# Patient Record
Sex: Male | Born: 1965
Health system: Southern US, Community
[De-identification: ages and names within clinical notes are randomized; demographics above are authoritative.]

## PROBLEM LIST (undated history)

## (undated) DIAGNOSIS — N182 Chronic kidney disease, stage 2 (mild): Secondary | ICD-10-CM

## (undated) DIAGNOSIS — Z8719 Personal history of other diseases of the digestive system: Secondary | ICD-10-CM

## (undated) DIAGNOSIS — I4891 Unspecified atrial fibrillation: Principal | ICD-10-CM

## (undated) DIAGNOSIS — Z720 Tobacco use: Secondary | ICD-10-CM

## (undated) DIAGNOSIS — F1020 Alcohol dependence, uncomplicated: Secondary | ICD-10-CM

## (undated) DIAGNOSIS — F329 Major depressive disorder, single episode, unspecified: Secondary | ICD-10-CM

## (undated) DIAGNOSIS — R51 Headache: Secondary | ICD-10-CM

## (undated) DIAGNOSIS — I219 Acute myocardial infarction, unspecified: Secondary | ICD-10-CM

## (undated) DIAGNOSIS — R519 Headache, unspecified: Secondary | ICD-10-CM

## (undated) DIAGNOSIS — F419 Anxiety disorder, unspecified: Secondary | ICD-10-CM

## (undated) DIAGNOSIS — J449 Chronic obstructive pulmonary disease, unspecified: Secondary | ICD-10-CM

## (undated) DIAGNOSIS — F32A Depression, unspecified: Secondary | ICD-10-CM

## (undated) DIAGNOSIS — I639 Cerebral infarction, unspecified: Secondary | ICD-10-CM

## (undated) DIAGNOSIS — I10 Essential (primary) hypertension: Secondary | ICD-10-CM

## (undated) DIAGNOSIS — IMO0001 Reserved for inherently not codable concepts without codable children: Secondary | ICD-10-CM

## (undated) HISTORY — PX: FRACTURE SURGERY: SHX138

## (undated) HISTORY — PX: HERNIA REPAIR: SHX51

---

## 2002-09-06 ENCOUNTER — Emergency Department (HOSPITAL_COMMUNITY): Admission: EM | Admit: 2002-09-06 | Discharge: 2002-09-06 | Payer: Self-pay | Admitting: Emergency Medicine

## 2003-09-03 ENCOUNTER — Observation Stay (HOSPITAL_COMMUNITY): Admission: EM | Admit: 2003-09-03 | Discharge: 2003-09-04 | Payer: Self-pay | Admitting: Emergency Medicine

## 2007-05-08 ENCOUNTER — Emergency Department (HOSPITAL_COMMUNITY): Admission: EM | Admit: 2007-05-08 | Discharge: 2007-05-08 | Payer: Self-pay | Admitting: Emergency Medicine

## 2007-07-30 ENCOUNTER — Emergency Department (HOSPITAL_COMMUNITY): Admission: EM | Admit: 2007-07-30 | Discharge: 2007-07-30 | Payer: Self-pay | Admitting: Emergency Medicine

## 2008-08-02 ENCOUNTER — Emergency Department (HOSPITAL_COMMUNITY): Admission: EM | Admit: 2008-08-02 | Discharge: 2008-08-03 | Payer: Self-pay | Admitting: Emergency Medicine

## 2008-08-03 ENCOUNTER — Ambulatory Visit: Payer: Self-pay | Admitting: Psychiatry

## 2008-08-03 ENCOUNTER — Inpatient Hospital Stay (HOSPITAL_COMMUNITY): Admission: AD | Admit: 2008-08-03 | Discharge: 2008-08-07 | Payer: Self-pay | Admitting: Psychiatry

## 2008-08-10 ENCOUNTER — Emergency Department (HOSPITAL_COMMUNITY): Admission: EM | Admit: 2008-08-10 | Discharge: 2008-08-11 | Payer: Self-pay | Admitting: Emergency Medicine

## 2008-08-16 ENCOUNTER — Emergency Department (HOSPITAL_COMMUNITY): Admission: EM | Admit: 2008-08-16 | Discharge: 2008-08-18 | Payer: Self-pay | Admitting: Emergency Medicine

## 2008-08-18 ENCOUNTER — Ambulatory Visit: Payer: Self-pay | Admitting: Psychiatry

## 2008-08-18 ENCOUNTER — Inpatient Hospital Stay (HOSPITAL_COMMUNITY): Admission: AD | Admit: 2008-08-18 | Discharge: 2008-08-22 | Payer: Self-pay | Admitting: Psychiatry

## 2008-08-28 ENCOUNTER — Emergency Department (HOSPITAL_COMMUNITY): Admission: EM | Admit: 2008-08-28 | Discharge: 2008-08-29 | Payer: Self-pay | Admitting: Emergency Medicine

## 2008-08-29 ENCOUNTER — Inpatient Hospital Stay (HOSPITAL_COMMUNITY): Admission: AD | Admit: 2008-08-29 | Discharge: 2008-09-04 | Payer: Self-pay | Admitting: Psychiatry

## 2008-09-05 ENCOUNTER — Emergency Department (HOSPITAL_COMMUNITY): Admission: EM | Admit: 2008-09-05 | Discharge: 2008-09-06 | Payer: Self-pay | Admitting: Emergency Medicine

## 2008-09-09 ENCOUNTER — Emergency Department (HOSPITAL_COMMUNITY): Admission: EM | Admit: 2008-09-09 | Discharge: 2008-09-09 | Payer: Self-pay | Admitting: Emergency Medicine

## 2008-09-09 ENCOUNTER — Emergency Department (HOSPITAL_COMMUNITY): Admission: EM | Admit: 2008-09-09 | Discharge: 2008-09-10 | Payer: Self-pay | Admitting: Emergency Medicine

## 2008-09-10 ENCOUNTER — Emergency Department (HOSPITAL_COMMUNITY): Admission: EM | Admit: 2008-09-10 | Discharge: 2008-09-11 | Payer: Self-pay | Admitting: Emergency Medicine

## 2008-09-11 ENCOUNTER — Inpatient Hospital Stay (HOSPITAL_COMMUNITY): Admission: AD | Admit: 2008-09-11 | Discharge: 2008-09-18 | Payer: Self-pay | Admitting: *Deleted

## 2008-09-19 ENCOUNTER — Emergency Department (HOSPITAL_COMMUNITY): Admission: EM | Admit: 2008-09-19 | Discharge: 2008-09-20 | Payer: Self-pay | Admitting: Emergency Medicine

## 2008-10-08 ENCOUNTER — Emergency Department (HOSPITAL_COMMUNITY): Admission: EM | Admit: 2008-10-08 | Discharge: 2008-10-09 | Payer: Self-pay | Admitting: Emergency Medicine

## 2008-10-13 ENCOUNTER — Emergency Department (HOSPITAL_COMMUNITY): Admission: EM | Admit: 2008-10-13 | Discharge: 2008-10-14 | Payer: Self-pay | Admitting: Emergency Medicine

## 2008-10-14 ENCOUNTER — Ambulatory Visit: Payer: Self-pay | Admitting: Psychiatry

## 2008-10-14 ENCOUNTER — Inpatient Hospital Stay (HOSPITAL_COMMUNITY): Admission: RE | Admit: 2008-10-14 | Discharge: 2008-10-20 | Payer: Self-pay | Admitting: Psychiatry

## 2008-11-04 ENCOUNTER — Emergency Department (HOSPITAL_COMMUNITY): Admission: EM | Admit: 2008-11-04 | Discharge: 2008-11-06 | Payer: Self-pay | Admitting: Emergency Medicine

## 2008-11-10 ENCOUNTER — Emergency Department (HOSPITAL_COMMUNITY): Admission: EM | Admit: 2008-11-10 | Discharge: 2008-11-11 | Payer: Self-pay | Admitting: Emergency Medicine

## 2008-11-11 ENCOUNTER — Inpatient Hospital Stay (HOSPITAL_COMMUNITY): Admission: AD | Admit: 2008-11-11 | Discharge: 2008-11-21 | Payer: Self-pay | Admitting: Psychiatry

## 2008-11-29 ENCOUNTER — Emergency Department (HOSPITAL_COMMUNITY): Admission: EM | Admit: 2008-11-29 | Discharge: 2008-11-30 | Payer: Self-pay | Admitting: Emergency Medicine

## 2008-11-30 ENCOUNTER — Ambulatory Visit: Payer: Self-pay | Admitting: Psychiatry

## 2008-11-30 ENCOUNTER — Inpatient Hospital Stay (HOSPITAL_COMMUNITY): Admission: RE | Admit: 2008-11-30 | Discharge: 2008-12-02 | Payer: Self-pay | Admitting: Psychiatry

## 2008-12-16 ENCOUNTER — Emergency Department (HOSPITAL_COMMUNITY): Admission: EM | Admit: 2008-12-16 | Discharge: 2008-12-19 | Payer: Self-pay | Admitting: Emergency Medicine

## 2009-01-31 ENCOUNTER — Emergency Department (HOSPITAL_COMMUNITY): Admission: EM | Admit: 2009-01-31 | Discharge: 2009-02-01 | Payer: Self-pay | Admitting: Emergency Medicine

## 2009-03-05 ENCOUNTER — Emergency Department (HOSPITAL_COMMUNITY): Admission: EM | Admit: 2009-03-05 | Discharge: 2009-03-06 | Payer: Self-pay | Admitting: Emergency Medicine

## 2009-03-06 ENCOUNTER — Inpatient Hospital Stay (HOSPITAL_COMMUNITY): Admission: EM | Admit: 2009-03-06 | Discharge: 2009-03-13 | Payer: Self-pay | Admitting: Psychiatry

## 2009-03-06 ENCOUNTER — Ambulatory Visit: Payer: Self-pay | Admitting: Psychiatry

## 2009-04-12 ENCOUNTER — Emergency Department (HOSPITAL_COMMUNITY): Admission: EM | Admit: 2009-04-12 | Discharge: 2009-04-12 | Payer: Self-pay | Admitting: Emergency Medicine

## 2009-04-12 ENCOUNTER — Emergency Department (HOSPITAL_COMMUNITY): Admission: EM | Admit: 2009-04-12 | Discharge: 2009-04-13 | Payer: Self-pay | Admitting: Emergency Medicine

## 2009-05-19 ENCOUNTER — Emergency Department (HOSPITAL_COMMUNITY): Admission: EM | Admit: 2009-05-19 | Discharge: 2009-05-19 | Payer: Self-pay | Admitting: Emergency Medicine

## 2009-05-23 ENCOUNTER — Emergency Department (HOSPITAL_COMMUNITY): Admission: EM | Admit: 2009-05-23 | Discharge: 2009-05-24 | Payer: Self-pay | Admitting: Emergency Medicine

## 2009-07-17 ENCOUNTER — Emergency Department (HOSPITAL_COMMUNITY): Admission: EM | Admit: 2009-07-17 | Discharge: 2009-07-18 | Payer: Self-pay | Admitting: Emergency Medicine

## 2009-08-04 ENCOUNTER — Emergency Department (HOSPITAL_COMMUNITY): Admission: EM | Admit: 2009-08-04 | Discharge: 2009-08-05 | Payer: Self-pay | Admitting: Emergency Medicine

## 2009-08-29 ENCOUNTER — Emergency Department (HOSPITAL_COMMUNITY): Admission: EM | Admit: 2009-08-29 | Discharge: 2009-09-03 | Payer: Self-pay | Admitting: Emergency Medicine

## 2009-08-31 ENCOUNTER — Ambulatory Visit: Payer: Self-pay | Admitting: Psychiatry

## 2009-09-13 ENCOUNTER — Ambulatory Visit: Payer: Self-pay | Admitting: Psychiatry

## 2009-09-21 ENCOUNTER — Emergency Department (HOSPITAL_COMMUNITY): Admission: EM | Admit: 2009-09-21 | Discharge: 2009-09-29 | Payer: Self-pay | Admitting: Emergency Medicine

## 2009-09-24 ENCOUNTER — Ambulatory Visit: Payer: Self-pay | Admitting: Psychiatry

## 2009-10-24 ENCOUNTER — Emergency Department (HOSPITAL_COMMUNITY): Admission: EM | Admit: 2009-10-24 | Discharge: 2009-10-24 | Payer: Self-pay | Admitting: Emergency Medicine

## 2009-10-24 ENCOUNTER — Ambulatory Visit: Payer: Self-pay | Admitting: Psychiatry

## 2010-01-07 ENCOUNTER — Emergency Department (HOSPITAL_COMMUNITY): Admission: EM | Admit: 2010-01-07 | Discharge: 2009-09-15 | Payer: Self-pay | Admitting: Emergency Medicine

## 2010-03-18 ENCOUNTER — Emergency Department (HOSPITAL_COMMUNITY)
Admission: EM | Admit: 2010-03-18 | Discharge: 2010-03-19 | Disposition: A | Discharge status: Home / Self Care | Payer: Self-pay | Attending: Emergency Medicine | Admitting: Emergency Medicine

## 2010-03-18 ENCOUNTER — Encounter (HOSPITAL_COMMUNITY): Payer: Self-pay

## 2010-03-18 ENCOUNTER — Emergency Department (HOSPITAL_COMMUNITY): Payer: Self-pay

## 2010-03-18 DIAGNOSIS — R51 Headache: Secondary | ICD-10-CM | POA: Insufficient documentation

## 2010-03-18 DIAGNOSIS — F3289 Other specified depressive episodes: Secondary | ICD-10-CM | POA: Insufficient documentation

## 2010-03-18 DIAGNOSIS — F329 Major depressive disorder, single episode, unspecified: Secondary | ICD-10-CM | POA: Insufficient documentation

## 2010-03-18 DIAGNOSIS — Z139 Encounter for screening, unspecified: Secondary | ICD-10-CM | POA: Insufficient documentation

## 2010-03-18 DIAGNOSIS — F1010 Alcohol abuse, uncomplicated: Secondary | ICD-10-CM | POA: Insufficient documentation

## 2010-03-18 LAB — CBC
HCT: 39.9 % (ref 39.0–52.0)
Hemoglobin: 14.2 g/dL (ref 13.0–17.0)
MCV: 90.7 fL (ref 78.0–100.0)
RDW: 12.6 % (ref 11.5–15.5)
WBC: 3.7 10*3/uL — ABNORMAL LOW (ref 4.0–10.5)

## 2010-03-18 LAB — RAPID URINE DRUG SCREEN, HOSP PERFORMED
Amphetamines: NOT DETECTED
Barbiturates: NOT DETECTED
Opiates: NOT DETECTED
Tetrahydrocannabinol: NOT DETECTED

## 2010-03-18 LAB — DIFFERENTIAL
Eosinophils Absolute: 0.1 10*3/uL (ref 0.0–0.7)
Monocytes Absolute: 0.4 10*3/uL (ref 0.1–1.0)

## 2010-03-18 LAB — COMPREHENSIVE METABOLIC PANEL
AST: 20 U/L (ref 0–37)
Albumin: 3.9 g/dL (ref 3.5–5.2)
Alkaline Phosphatase: 50 U/L (ref 39–117)
BUN: 5 mg/dL — ABNORMAL LOW (ref 6–23)
CO2: 25 mEq/L (ref 19–32)
GFR calc non Af Amer: >60 mL/min (ref >60)
Potassium: 3.8 mEq/L (ref 3.5–5.1)
Sodium: 142 mEq/L (ref 135–145)
Total Bilirubin: 0.5 mg/dL (ref 0.3–1.2)

## 2010-03-18 LAB — URINALYSIS, ROUTINE W REFLEX MICROSCOPIC
Hgb urine dipstick: NEGATIVE
Ketones, ur: NEGATIVE mg/dL
Protein, ur: NEGATIVE mg/dL
Urine Glucose, Fasting: NEGATIVE mg/dL
Urobilinogen, UA: 0.2 mg/dL (ref 0.0–1.0)
pH: 5.5 (ref 5.0–8.0)

## 2010-03-18 LAB — ETHANOL: Alcohol, Ethyl (B): 184 mg/dL — ABNORMAL HIGH (ref 0–10)

## 2010-03-19 DIAGNOSIS — F1020 Alcohol dependence, uncomplicated: Secondary | ICD-10-CM
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
## 2010-04-11 ENCOUNTER — Emergency Department (HOSPITAL_COMMUNITY)
Admission: EM | Admit: 2010-04-11 | Discharge: 2010-04-11 | Disposition: A | Discharge status: Home / Self Care | Payer: Self-pay | Attending: Emergency Medicine | Admitting: Emergency Medicine

## 2010-04-11 ENCOUNTER — Emergency Department (HOSPITAL_COMMUNITY): Payer: Self-pay

## 2010-04-11 ENCOUNTER — Other Ambulatory Visit: Payer: Self-pay | Admitting: Emergency Medicine

## 2010-04-11 DIAGNOSIS — R45850 Homicidal ideations: Secondary | ICD-10-CM | POA: Insufficient documentation

## 2010-04-11 DIAGNOSIS — F1010 Alcohol abuse, uncomplicated: Secondary | ICD-10-CM | POA: Insufficient documentation

## 2010-04-11 DIAGNOSIS — R45851 Suicidal ideations: Secondary | ICD-10-CM | POA: Insufficient documentation

## 2010-04-11 DIAGNOSIS — R079 Chest pain, unspecified: Secondary | ICD-10-CM | POA: Insufficient documentation

## 2010-04-11 LAB — CBC
HCT: 43.4 % (ref 39.0–52.0)
MCH: 31.6 pg (ref 26.0–34.0)
Platelets: 221 10*3/uL (ref 150–400)
RDW: 13.3 % (ref 11.5–15.5)

## 2010-04-11 LAB — RAPID URINE DRUG SCREEN, HOSP PERFORMED
Benzodiazepines: NOT DETECTED
Tetrahydrocannabinol: NOT DETECTED

## 2010-04-11 LAB — BASIC METABOLIC PANEL: Creatinine, Ser: 0.7 mg/dL (ref 0.4–1.5)

## 2010-04-15 LAB — RAPID URINE DRUG SCREEN, HOSP PERFORMED
Barbiturates: NOT DETECTED
Benzodiazepines: NOT DETECTED
Cocaine: NOT DETECTED
Tetrahydrocannabinol: NOT DETECTED

## 2010-04-15 LAB — CBC
HCT: 41.7 % (ref 39.0–52.0)
MCH: 32.4 pg (ref 26.0–34.0)
MCV: 93.4 fL (ref 78.0–100.0)
Platelets: 201 10*3/uL (ref 150–400)
RDW: 12.6 % (ref 11.5–15.5)

## 2010-04-15 LAB — COMPREHENSIVE METABOLIC PANEL
Albumin: 4 g/dL (ref 3.5–5.2)
Alkaline Phosphatase: 57 U/L (ref 39–117)
BUN: 6 mg/dL (ref 6–23)
Creatinine, Ser: 0.78 mg/dL (ref 0.4–1.5)
Glucose, Bld: 88 mg/dL (ref 70–99)
Potassium: 3.8 mEq/L (ref 3.5–5.1)
Total Bilirubin: 0.6 mg/dL (ref 0.3–1.2)
Total Protein: 7.2 g/dL (ref 6.0–8.3)

## 2010-04-15 LAB — ETHANOL: Alcohol, Ethyl (B): 183 mg/dL — ABNORMAL HIGH (ref 0–10)

## 2010-04-15 LAB — DIFFERENTIAL
Basophils Absolute: 0.1 10*3/uL (ref 0.0–0.1)
Basophils Relative: 1 % (ref 0–1)
Lymphocytes Relative: 32 % (ref 12–46)
Monocytes Absolute: 0.8 10*3/uL (ref 0.1–1.0)
Monocytes Relative: 14 % — ABNORMAL HIGH (ref 3–12)
Neutro Abs: 3 10*3/uL (ref 1.7–7.7)
Neutrophils Relative %: 50 % (ref 43–77)

## 2010-04-15 LAB — URINALYSIS, ROUTINE W REFLEX MICROSCOPIC
Bilirubin Urine: NEGATIVE
Hgb urine dipstick: NEGATIVE
Ketones, ur: NEGATIVE mg/dL
Nitrite: NEGATIVE
Protein, ur: NEGATIVE mg/dL
Urobilinogen, UA: 0.2 mg/dL (ref 0.0–1.0)

## 2010-04-16 LAB — COMPREHENSIVE METABOLIC PANEL
Albumin: 3.7 g/dL (ref 3.5–5.2)
Alkaline Phosphatase: 63 U/L (ref 39–117)
BUN: 7 mg/dL (ref 6–23)
CO2: 24 mEq/L (ref 19–32)
Chloride: 111 mEq/L (ref 96–112)
Creatinine, Ser: 0.63 mg/dL (ref 0.4–1.5)
GFR calc non Af Amer: >60 mL/min (ref >60)
Glucose, Bld: 93 mg/dL (ref 70–99)
Potassium: 3.4 mEq/L — ABNORMAL LOW (ref 3.5–5.1)
Total Bilirubin: 0.6 mg/dL (ref 0.3–1.2)

## 2010-04-16 LAB — BASIC METABOLIC PANEL
CO2: 26 mEq/L (ref 19–32)
Chloride: 106 mEq/L (ref 96–112)
GFR calc Af Amer: >60 mL/min (ref >60)
Glucose, Bld: 92 mg/dL (ref 70–99)
Sodium: 140 mEq/L (ref 135–145)

## 2010-04-16 LAB — TROPONIN I: Troponin I: 0.01 ng/mL (ref 0.00–0.06)

## 2010-04-16 LAB — URINE CULTURE: Culture: NO GROWTH

## 2010-04-16 LAB — RAPID URINE DRUG SCREEN, HOSP PERFORMED
Barbiturates: NOT DETECTED
Barbiturates: NOT DETECTED
Cocaine: NOT DETECTED
Cocaine: NOT DETECTED
Opiates: NOT DETECTED
Opiates: NOT DETECTED
Tetrahydrocannabinol: NOT DETECTED

## 2010-04-16 LAB — POCT I-STAT, CHEM 8
Chloride: 106 mEq/L (ref 96–112)
Glucose, Bld: 91 mg/dL (ref 70–99)
HCT: 42 % (ref 39.0–52.0)
Hemoglobin: 14.3 g/dL (ref 13.0–17.0)
Potassium: 3.3 mEq/L — ABNORMAL LOW (ref 3.5–5.1)
Sodium: 143 mEq/L (ref 135–145)

## 2010-04-16 LAB — CBC
HCT: 41.1 % (ref 39.0–52.0)
HCT: 38.5 % — ABNORMAL LOW (ref 39.0–52.0)
Hemoglobin: 14.4 g/dL (ref 13.0–17.0)
MCH: 32.6 pg (ref 26.0–34.0)
MCH: 33.2 pg (ref 26.0–34.0)
MCHC: 34.9 g/dL (ref 30.0–36.0)
MCV: 93.4 fL (ref 78.0–100.0)
MCV: 93.2 fL (ref 78.0–100.0)
Platelets: 223 10*3/uL (ref 150–400)
Platelets: 207 10*3/uL (ref 150–400)
RBC: 4.4 MIL/uL (ref 4.22–5.81)
RBC: 4.14 MIL/uL — ABNORMAL LOW (ref 4.22–5.81)
RDW: 13.4 % (ref 11.5–15.5)

## 2010-04-16 LAB — URINALYSIS, ROUTINE W REFLEX MICROSCOPIC
Hgb urine dipstick: NEGATIVE
Ketones, ur: NEGATIVE mg/dL
Protein, ur: NEGATIVE mg/dL
Urobilinogen, UA: 0.2 mg/dL (ref 0.0–1.0)

## 2010-04-16 LAB — DIFFERENTIAL
Basophils Absolute: 0 10*3/uL (ref 0.0–0.1)
Basophils Absolute: 0.1 10*3/uL (ref 0.0–0.1)
Basophils Relative: 2 % — ABNORMAL HIGH (ref 0–1)
Lymphocytes Relative: 28 % (ref 12–46)
Lymphocytes Relative: 41 % (ref 12–46)
Neutro Abs: 3.4 10*3/uL (ref 1.7–7.7)
Neutro Abs: 1.8 10*3/uL (ref 1.7–7.7)
Neutrophils Relative %: 38 % — ABNORMAL LOW (ref 43–77)

## 2010-04-16 LAB — CK TOTAL AND CKMB (NOT AT ARMC)
Relative Index: 1.9 (ref 0.0–2.5)
Total CK: 129 U/L (ref 7–232)

## 2010-04-16 LAB — HEMOCCULT GUIAC POC 1CARD (OFFICE): Fecal Occult Bld: NEGATIVE

## 2010-04-16 LAB — SALICYLATE LEVEL: Salicylate Lvl: <4 mg/dL (ref 2.8–20.0)

## 2010-04-17 LAB — CBC
MCH: 33.2 pg (ref 26.0–34.0)
MCV: 93.5 fL (ref 78.0–100.0)
Platelets: 245 10*3/uL (ref 150–400)
RDW: 12.9 % (ref 11.5–15.5)
WBC: 5.9 10*3/uL (ref 4.0–10.5)

## 2010-04-17 LAB — DIFFERENTIAL
Lymphocytes Relative: 35 % (ref 12–46)
Monocytes Absolute: 1 10*3/uL (ref 0.1–1.0)
Monocytes Relative: 16 % — ABNORMAL HIGH (ref 3–12)
Neutro Abs: 2.6 10*3/uL (ref 1.7–7.7)

## 2010-04-17 LAB — COMPREHENSIVE METABOLIC PANEL
Albumin: 4.2 g/dL (ref 3.5–5.2)
BUN: 5 mg/dL — ABNORMAL LOW (ref 6–23)
Creatinine, Ser: 0.81 mg/dL (ref 0.4–1.5)
Total Protein: 7.4 g/dL (ref 6.0–8.3)

## 2010-04-17 LAB — RAPID URINE DRUG SCREEN, HOSP PERFORMED
Amphetamines: NOT DETECTED
Barbiturates: NOT DETECTED
Benzodiazepines: NOT DETECTED
Opiates: NOT DETECTED

## 2010-04-18 LAB — DIFFERENTIAL
Basophils Relative: 1 % (ref 0–1)
Eosinophils Absolute: 0.3 10*3/uL (ref 0.0–0.7)
Eosinophils Relative: 6 % — ABNORMAL HIGH (ref 0–5)
Monocytes Absolute: 0.6 10*3/uL (ref 0.1–1.0)
Monocytes Relative: 12 % (ref 3–12)
Neutrophils Relative %: 34 % — ABNORMAL LOW (ref 43–77)

## 2010-04-18 LAB — URINALYSIS, ROUTINE W REFLEX MICROSCOPIC
Ketones, ur: NEGATIVE mg/dL
Protein, ur: NEGATIVE mg/dL
Urobilinogen, UA: 0.2 mg/dL (ref 0.0–1.0)

## 2010-04-18 LAB — BASIC METABOLIC PANEL
Calcium: 8.9 mg/dL (ref 8.4–10.5)
Creatinine, Ser: 0.71 mg/dL (ref 0.4–1.5)
GFR calc Af Amer: >60 mL/min (ref >60)
GFR calc non Af Amer: >60 mL/min (ref >60)

## 2010-04-18 LAB — ETHANOL
Alcohol, Ethyl (B): 216 mg/dL — ABNORMAL HIGH (ref 0–10)
Alcohol, Ethyl (B): 98 mg/dL — ABNORMAL HIGH (ref 0–10)
Alcohol, Ethyl (B): 249 mg/dL — ABNORMAL HIGH (ref 0–10)
Alcohol, Ethyl (B): 31 mg/dL — ABNORMAL HIGH (ref 0–10)

## 2010-04-18 LAB — COMPREHENSIVE METABOLIC PANEL
ALT: 18 U/L (ref 0–53)
Alkaline Phosphatase: 52 U/L (ref 39–117)
CO2: 28 mEq/L (ref 19–32)
Glucose, Bld: 92 mg/dL (ref 70–99)
Potassium: 3.3 mEq/L — ABNORMAL LOW (ref 3.5–5.1)
Sodium: 144 mEq/L (ref 135–145)
Total Protein: 6.1 g/dL (ref 6.0–8.3)

## 2010-04-18 LAB — RAPID URINE DRUG SCREEN, HOSP PERFORMED
Barbiturates: NOT DETECTED
Opiates: NOT DETECTED
Tetrahydrocannabinol: NOT DETECTED

## 2010-04-18 LAB — ACETAMINOPHEN LEVEL: Acetaminophen (Tylenol), Serum: <10 ug/mL — ABNORMAL LOW (ref 10–30)

## 2010-04-18 LAB — CBC
HCT: 36.3 % — ABNORMAL LOW (ref 39.0–52.0)
Hemoglobin: 12.7 g/dL — ABNORMAL LOW (ref 13.0–17.0)
RDW: 12.7 % (ref 11.5–15.5)
WBC: 4.5 10*3/uL (ref 4.0–10.5)

## 2010-04-20 LAB — URINALYSIS, ROUTINE W REFLEX MICROSCOPIC
Bilirubin Urine: NEGATIVE
Glucose, UA: NEGATIVE mg/dL
Hgb urine dipstick: NEGATIVE
Ketones, ur: NEGATIVE mg/dL
Protein, ur: NEGATIVE mg/dL
Urobilinogen, UA: 0.2 mg/dL (ref 0.0–1.0)

## 2010-04-20 LAB — COMPREHENSIVE METABOLIC PANEL
ALT: 19 U/L (ref 0–53)
ALT: 23 U/L (ref 0–53)
AST: 25 U/L (ref 0–37)
Albumin: 3.6 g/dL (ref 3.5–5.2)
Albumin: 4.1 g/dL (ref 3.5–5.2)
Alkaline Phosphatase: 59 U/L (ref 39–117)
BUN: 6 mg/dL (ref 6–23)
CO2: 28 mEq/L (ref 19–32)
Calcium: 8.9 mg/dL (ref 8.4–10.5)
Chloride: 108 mEq/L (ref 96–112)
Chloride: 107 mEq/L (ref 96–112)
Creatinine, Ser: 0.77 mg/dL (ref 0.4–1.5)
GFR calc Af Amer: >60 mL/min (ref >60)
Glucose, Bld: 98 mg/dL (ref 70–99)
Potassium: 3.7 mEq/L (ref 3.5–5.1)
Sodium: 141 mEq/L (ref 135–145)
Sodium: 140 mEq/L (ref 135–145)
Total Bilirubin: 0.3 mg/dL (ref 0.3–1.2)
Total Bilirubin: 0.5 mg/dL (ref 0.3–1.2)
Total Protein: 6.1 g/dL (ref 6.0–8.3)

## 2010-04-20 LAB — DIFFERENTIAL
Basophils Absolute: 0 10*3/uL (ref 0.0–0.1)
Basophils Relative: 1 % (ref 0–1)
Eosinophils Absolute: 0.3 10*3/uL (ref 0.0–0.7)
Eosinophils Absolute: 0.3 10*3/uL (ref 0.0–0.7)
Eosinophils Relative: 5 % (ref 0–5)
Eosinophils Relative: 5 % (ref 0–5)
Lymphocytes Relative: 42 % (ref 12–46)
Lymphs Abs: 2.3 10*3/uL (ref 0.7–4.0)
Monocytes Absolute: 0.7 10*3/uL (ref 0.1–1.0)
Monocytes Absolute: 0.6 10*3/uL (ref 0.1–1.0)
Monocytes Relative: 11 % (ref 3–12)
Neutro Abs: 3.3 10*3/uL (ref 1.7–7.7)

## 2010-04-20 LAB — RAPID URINE DRUG SCREEN, HOSP PERFORMED
Amphetamines: NOT DETECTED
Barbiturates: NOT DETECTED
Benzodiazepines: NOT DETECTED
Tetrahydrocannabinol: NOT DETECTED

## 2010-04-20 LAB — CBC
HCT: 39.5 % (ref 39.0–52.0)
Hemoglobin: 14 g/dL (ref 13.0–17.0)
MCV: 95.6 fL (ref 78.0–100.0)
Platelets: 179 10*3/uL (ref 150–400)
Platelets: 228 10*3/uL (ref 150–400)
RBC: 4.87 MIL/uL (ref 4.22–5.81)
WBC: 5.9 10*3/uL (ref 4.0–10.5)
WBC: 5.5 10*3/uL (ref 4.0–10.5)

## 2010-04-20 LAB — ETHANOL: Alcohol, Ethyl (B): 183 mg/dL — ABNORMAL HIGH (ref 0–10)

## 2010-04-21 LAB — CBC
HCT: 43 % (ref 39.0–52.0)
Hemoglobin: 14.9 g/dL (ref 13.0–17.0)
MCV: 94.3 fL (ref 78.0–100.0)
RBC: 4.56 MIL/uL (ref 4.22–5.81)
WBC: 5.4 10*3/uL (ref 4.0–10.5)

## 2010-04-21 LAB — RAPID URINE DRUG SCREEN, HOSP PERFORMED
Amphetamines: NOT DETECTED
Opiates: NOT DETECTED
Tetrahydrocannabinol: NOT DETECTED

## 2010-04-21 LAB — DIFFERENTIAL
Eosinophils Absolute: 0.2 10*3/uL (ref 0.0–0.7)
Eosinophils Relative: 5 % (ref 0–5)
Lymphs Abs: 2.2 10*3/uL (ref 0.7–4.0)
Monocytes Absolute: 0.7 10*3/uL (ref 0.1–1.0)
Monocytes Relative: 13 % — ABNORMAL HIGH (ref 3–12)
Neutrophils Relative %: 41 % — ABNORMAL LOW (ref 43–77)

## 2010-04-21 LAB — URINALYSIS, ROUTINE W REFLEX MICROSCOPIC
Bilirubin Urine: NEGATIVE
Nitrite: NEGATIVE
Specific Gravity, Urine: 1.008 (ref 1.005–1.030)
Urobilinogen, UA: 0.2 mg/dL (ref 0.0–1.0)

## 2010-04-21 LAB — BASIC METABOLIC PANEL
CO2: 24 mEq/L (ref 19–32)
Chloride: 105 mEq/L (ref 96–112)
GFR calc Af Amer: >60 mL/min (ref >60)
Potassium: 3.7 mEq/L (ref 3.5–5.1)

## 2010-04-21 LAB — ETHANOL: Alcohol, Ethyl (B): 342 mg/dL — ABNORMAL HIGH (ref 0–10)

## 2010-04-25 LAB — DIFFERENTIAL
Basophils Absolute: 0 10*3/uL (ref 0.0–0.1)
Eosinophils Relative: 1 % (ref 0–5)
Lymphocytes Relative: 25 % (ref 12–46)
Lymphocytes Relative: 37 % (ref 12–46)
Lymphs Abs: 1.9 10*3/uL (ref 0.7–4.0)
Lymphs Abs: 1.9 10*3/uL (ref 0.7–4.0)
Monocytes Absolute: 0.5 10*3/uL (ref 0.1–1.0)
Monocytes Relative: 11 % (ref 3–12)
Neutro Abs: 2.4 10*3/uL (ref 1.7–7.7)
Neutro Abs: 4.7 10*3/uL (ref 1.7–7.7)
Neutrophils Relative %: 64 % (ref 43–77)

## 2010-04-25 LAB — RAPID URINE DRUG SCREEN, HOSP PERFORMED
Amphetamines: NOT DETECTED
Barbiturates: NOT DETECTED
Benzodiazepines: NOT DETECTED
Opiates: NOT DETECTED

## 2010-04-25 LAB — COMPREHENSIVE METABOLIC PANEL
AST: 23 U/L (ref 0–37)
Albumin: 4.2 g/dL (ref 3.5–5.2)
BUN: 5 mg/dL — ABNORMAL LOW (ref 6–23)
BUN: 8 mg/dL (ref 6–23)
CO2: 23 mEq/L (ref 19–32)
Calcium: 9 mg/dL (ref 8.4–10.5)
Creatinine, Ser: 0.71 mg/dL (ref 0.4–1.5)
Creatinine, Ser: 0.85 mg/dL (ref 0.4–1.5)
GFR calc Af Amer: >60 mL/min (ref >60)
GFR calc non Af Amer: >60 mL/min (ref >60)
Potassium: 5.9 mEq/L — ABNORMAL HIGH (ref 3.5–5.1)
Total Protein: 7.1 g/dL (ref 6.0–8.3)

## 2010-04-25 LAB — URINALYSIS, ROUTINE W REFLEX MICROSCOPIC
Glucose, UA: NEGATIVE mg/dL
Hgb urine dipstick: NEGATIVE
Specific Gravity, Urine: 1.005 (ref 1.005–1.030)

## 2010-04-25 LAB — ETHANOL: Alcohol, Ethyl (B): 269 mg/dL — ABNORMAL HIGH (ref 0–10)

## 2010-04-25 LAB — CBC
HCT: 44.5 % (ref 39.0–52.0)
HCT: 47.9 % (ref 39.0–52.0)
MCHC: 33.1 g/dL (ref 30.0–36.0)
MCV: 96.5 fL (ref 78.0–100.0)
MCV: 96.8 fL (ref 78.0–100.0)
Platelets: 194 10*3/uL (ref 150–400)
RBC: 4.61 MIL/uL (ref 4.22–5.81)
RDW: 13.4 % (ref 11.5–15.5)

## 2010-04-25 LAB — LIPASE, BLOOD: Lipase: 29 U/L (ref 11–59)

## 2010-05-03 ENCOUNTER — Encounter (HOSPITAL_COMMUNITY): Payer: Self-pay | Admitting: Radiology

## 2010-05-03 ENCOUNTER — Emergency Department (HOSPITAL_COMMUNITY): Payer: Self-pay

## 2010-05-03 ENCOUNTER — Emergency Department (HOSPITAL_COMMUNITY)
Admission: EM | Admit: 2010-05-03 | Discharge: 2010-05-03 | Disposition: A | Discharge status: Home / Self Care | Payer: Self-pay | Attending: Emergency Medicine | Admitting: Emergency Medicine

## 2010-05-03 DIAGNOSIS — F3130 Bipolar disorder, current episode depressed, mild or moderate severity, unspecified: Secondary | ICD-10-CM | POA: Insufficient documentation

## 2010-05-03 DIAGNOSIS — R109 Unspecified abdominal pain: Secondary | ICD-10-CM | POA: Insufficient documentation

## 2010-05-03 DIAGNOSIS — Z9889 Other specified postprocedural states: Secondary | ICD-10-CM | POA: Insufficient documentation

## 2010-05-03 DIAGNOSIS — R042 Hemoptysis: Secondary | ICD-10-CM | POA: Insufficient documentation

## 2010-05-03 DIAGNOSIS — R05 Cough: Secondary | ICD-10-CM | POA: Insufficient documentation

## 2010-05-03 DIAGNOSIS — R059 Cough, unspecified: Secondary | ICD-10-CM | POA: Insufficient documentation

## 2010-05-03 HISTORY — DX: Essential (primary) hypertension: I10

## 2010-05-03 LAB — DIFFERENTIAL
Basophils Absolute: 0 10*3/uL (ref 0.0–0.1)
Basophils Relative: 1 % (ref 0–1)
Eosinophils Absolute: 0.3 10*3/uL (ref 0.0–0.7)
Eosinophils Relative: 5 % (ref 0–5)
Lymphocytes Relative: 33 % (ref 12–46)

## 2010-05-03 LAB — COMPREHENSIVE METABOLIC PANEL
ALT: 23 U/L (ref 0–53)
Albumin: 3.3 g/dL — ABNORMAL LOW (ref 3.5–5.2)
Alkaline Phosphatase: 60 U/L (ref 39–117)
Calcium: 8.7 mg/dL (ref 8.4–10.5)
GFR calc Af Amer: >60 mL/min (ref >60)
Potassium: 4.1 mEq/L (ref 3.5–5.1)
Sodium: 139 mEq/L (ref 135–145)
Total Protein: 5.6 g/dL — ABNORMAL LOW (ref 6.0–8.3)

## 2010-05-03 LAB — CBC
Platelets: 175 10*3/uL (ref 150–400)
RDW: 13.1 % (ref 11.5–15.5)
WBC: 5.1 10*3/uL (ref 4.0–10.5)

## 2010-05-03 MED ORDER — IOHEXOL 300 MG/ML  SOLN
100.0000 mL | Freq: Once | INTRAMUSCULAR | Status: AC | PRN
  Administered 2010-05-03: 100 mL via INTRAVENOUS

## 2010-05-05 LAB — BASIC METABOLIC PANEL
BUN: 4 mg/dL — ABNORMAL LOW (ref 6–23)
CO2: 29 mEq/L (ref 19–32)
Calcium: 9.4 mg/dL (ref 8.4–10.5)
Creatinine, Ser: 0.78 mg/dL (ref 0.4–1.5)
GFR calc Af Amer: >60 mL/min (ref >60)

## 2010-05-05 LAB — DIFFERENTIAL
Basophils Relative: 0 % (ref 0–1)
Eosinophils Absolute: 0.2 10*3/uL (ref 0.0–0.7)
Monocytes Relative: 9 % (ref 3–12)
Neutro Abs: 2.4 10*3/uL (ref 1.7–7.7)
Neutrophils Relative %: 43 % (ref 43–77)

## 2010-05-05 LAB — HEPATIC FUNCTION PANEL
AST: 27 U/L (ref 0–37)
Albumin: 4.4 g/dL (ref 3.5–5.2)
Total Bilirubin: 0.3 mg/dL (ref 0.3–1.2)
Total Protein: 8.1 g/dL (ref 6.0–8.3)

## 2010-05-05 LAB — CBC
MCHC: 33.9 g/dL (ref 30.0–36.0)
MCV: 94.9 fL (ref 78.0–100.0)
Platelets: 209 10*3/uL (ref 150–400)
RBC: 4.97 MIL/uL (ref 4.22–5.81)
WBC: 5.5 10*3/uL (ref 4.0–10.5)

## 2010-05-05 LAB — LIPASE, BLOOD: Lipase: 19 U/L (ref 11–59)

## 2010-05-05 LAB — ETHANOL
Alcohol, Ethyl (B): 224 mg/dL — ABNORMAL HIGH (ref 0–10)
Alcohol, Ethyl (B): <5 mg/dL (ref 0–10)

## 2010-05-05 LAB — RAPID URINE DRUG SCREEN, HOSP PERFORMED
Amphetamines: NOT DETECTED
Benzodiazepines: NOT DETECTED
Cocaine: NOT DETECTED

## 2010-05-06 LAB — CBC
HCT: 44.1 % (ref 39.0–52.0)
HCT: 44.4 % (ref 39.0–52.0)
Hemoglobin: 15.1 g/dL (ref 13.0–17.0)
Hemoglobin: 15.3 g/dL (ref 13.0–17.0)
Hemoglobin: 15.4 g/dL (ref 13.0–17.0)
MCHC: 34.1 g/dL (ref 30.0–36.0)
Platelets: 221 10*3/uL (ref 150–400)
RBC: 4.6 MIL/uL (ref 4.22–5.81)
RBC: 4.56 MIL/uL (ref 4.22–5.81)
RDW: 12.9 % (ref 11.5–15.5)
RDW: 12.8 % (ref 11.5–15.5)
RDW: 13.2 % (ref 11.5–15.5)
WBC: 6.2 10*3/uL (ref 4.0–10.5)

## 2010-05-06 LAB — DIFFERENTIAL
Basophils Absolute: 0 10*3/uL (ref 0.0–0.1)
Basophils Absolute: 0 10*3/uL (ref 0.0–0.1)
Basophils Absolute: 0.1 10*3/uL (ref 0.0–0.1)
Basophils Relative: 1 % (ref 0–1)
Basophils Relative: 1 % (ref 0–1)
Eosinophils Absolute: 0.3 10*3/uL (ref 0.0–0.7)
Eosinophils Relative: 4 % (ref 0–5)
Lymphocytes Relative: 40 % (ref 12–46)
Monocytes Absolute: 0.7 10*3/uL (ref 0.1–1.0)
Monocytes Absolute: 0.9 10*3/uL (ref 0.1–1.0)
Monocytes Relative: 14 % — ABNORMAL HIGH (ref 3–12)
Monocytes Relative: 14 % — ABNORMAL HIGH (ref 3–12)
Monocytes Relative: 15 % — ABNORMAL HIGH (ref 3–12)
Neutro Abs: 2.2 10*3/uL (ref 1.7–7.7)
Neutro Abs: 2.2 10*3/uL (ref 1.7–7.7)
Neutro Abs: 2.7 10*3/uL (ref 1.7–7.7)
Neutrophils Relative %: 42 % — ABNORMAL LOW (ref 43–77)
Neutrophils Relative %: 43 % (ref 43–77)

## 2010-05-06 LAB — HEMOCCULT GUIAC POC 1CARD (OFFICE): Fecal Occult Bld: NEGATIVE

## 2010-05-06 LAB — COMPREHENSIVE METABOLIC PANEL
ALT: 33 U/L (ref 0–53)
AST: 29 U/L (ref 0–37)
Alkaline Phosphatase: 57 U/L (ref 39–117)
BUN: 8 mg/dL (ref 6–23)
CO2: 25 mEq/L (ref 19–32)
CO2: 28 mEq/L (ref 19–32)
Calcium: 9.2 mg/dL (ref 8.4–10.5)
Chloride: 105 mEq/L (ref 96–112)
Chloride: 106 mEq/L (ref 96–112)
GFR calc Af Amer: >60 mL/min (ref >60)
GFR calc non Af Amer: >60 mL/min (ref >60)
Glucose, Bld: 95 mg/dL (ref 70–99)
Potassium: 3.7 mEq/L (ref 3.5–5.1)
Potassium: 3.9 mEq/L (ref 3.5–5.1)
Sodium: 139 mEq/L (ref 135–145)
Sodium: 139 mEq/L (ref 135–145)
Total Bilirubin: 0.6 mg/dL (ref 0.3–1.2)

## 2010-05-06 LAB — RAPID URINE DRUG SCREEN, HOSP PERFORMED
Amphetamines: NOT DETECTED
Barbiturates: NOT DETECTED
Barbiturates: NOT DETECTED
Benzodiazepines: POSITIVE — AB
Benzodiazepines: NOT DETECTED
Benzodiazepines: POSITIVE — AB
Cocaine: NOT DETECTED
Cocaine: NOT DETECTED
Opiates: NOT DETECTED
Opiates: NOT DETECTED
Tetrahydrocannabinol: NOT DETECTED

## 2010-05-06 LAB — BASIC METABOLIC PANEL
CO2: 23 mEq/L (ref 19–32)
Calcium: 8.9 mg/dL (ref 8.4–10.5)
Chloride: 107 mEq/L (ref 96–112)
GFR calc Af Amer: >60 mL/min (ref >60)
Glucose, Bld: 83 mg/dL (ref 70–99)
Potassium: 3.2 mEq/L — ABNORMAL LOW (ref 3.5–5.1)
Sodium: 138 mEq/L (ref 135–145)

## 2010-05-06 LAB — ETHANOL
Alcohol, Ethyl (B): 116 mg/dL — ABNORMAL HIGH (ref 0–10)
Alcohol, Ethyl (B): 285 mg/dL — ABNORMAL HIGH (ref 0–10)

## 2010-05-06 LAB — URINALYSIS, ROUTINE W REFLEX MICROSCOPIC
Glucose, UA: NEGATIVE mg/dL
Hgb urine dipstick: NEGATIVE
Protein, ur: NEGATIVE mg/dL
pH: 6 (ref 5.0–8.0)

## 2010-05-07 LAB — COMPREHENSIVE METABOLIC PANEL
AST: 25 U/L (ref 0–37)
AST: 26 U/L (ref 0–37)
Albumin: 4.1 g/dL (ref 3.5–5.2)
Alkaline Phosphatase: 62 U/L (ref 39–117)
CO2: 24 mEq/L (ref 19–32)
CO2: 24 mEq/L (ref 19–32)
Calcium: 9.4 mg/dL (ref 8.4–10.5)
Chloride: 104 mEq/L (ref 96–112)
Creatinine, Ser: 0.84 mg/dL (ref 0.4–1.5)
GFR calc Af Amer: >60 mL/min (ref >60)
GFR calc Af Amer: >60 mL/min (ref >60)
GFR calc non Af Amer: >60 mL/min (ref >60)
GFR calc non Af Amer: >60 mL/min (ref >60)
Potassium: 3.7 mEq/L (ref 3.5–5.1)
Total Bilirubin: 1.1 mg/dL (ref 0.3–1.2)
Total Protein: 7.8 g/dL (ref 6.0–8.3)

## 2010-05-07 LAB — URINALYSIS, ROUTINE W REFLEX MICROSCOPIC
Glucose, UA: NEGATIVE mg/dL
Hgb urine dipstick: NEGATIVE
Hgb urine dipstick: NEGATIVE
Ketones, ur: NEGATIVE mg/dL
Nitrite: NEGATIVE
Protein, ur: NEGATIVE mg/dL
Protein, ur: NEGATIVE mg/dL
Specific Gravity, Urine: 1.027 (ref 1.005–1.030)
Urobilinogen, UA: 0.2 mg/dL (ref 0.0–1.0)

## 2010-05-07 LAB — CBC
HCT: 44.6 % (ref 39.0–52.0)
MCHC: 34.5 g/dL (ref 30.0–36.0)
MCV: 95.2 fL (ref 78.0–100.0)
Platelets: 193 10*3/uL (ref 150–400)
Platelets: 198 10*3/uL (ref 150–400)
RBC: 4.67 MIL/uL (ref 4.22–5.81)
RBC: 4.87 MIL/uL (ref 4.22–5.81)
RDW: 12.9 % (ref 11.5–15.5)
WBC: 10.9 10*3/uL — ABNORMAL HIGH (ref 4.0–10.5)

## 2010-05-07 LAB — RAPID URINE DRUG SCREEN, HOSP PERFORMED
Amphetamines: NOT DETECTED
Amphetamines: NOT DETECTED
Barbiturates: NOT DETECTED
Barbiturates: NOT DETECTED
Benzodiazepines: POSITIVE — AB
Opiates: NOT DETECTED
Opiates: NOT DETECTED
Tetrahydrocannabinol: NOT DETECTED

## 2010-05-07 LAB — DIFFERENTIAL
Basophils Absolute: 0 10*3/uL (ref 0.0–0.1)
Basophils Relative: 0 % (ref 0–1)
Eosinophils Absolute: 0.1 10*3/uL (ref 0.0–0.7)
Eosinophils Relative: 1 % (ref 0–5)
Eosinophils Relative: 4 % (ref 0–5)
Lymphocytes Relative: 33 % (ref 12–46)
Lymphs Abs: 2 10*3/uL (ref 0.7–4.0)
Monocytes Absolute: 1.3 10*3/uL — ABNORMAL HIGH (ref 0.1–1.0)

## 2010-05-07 LAB — AMYLASE: Amylase: 37 U/L (ref 27–131)

## 2010-05-08 LAB — DIFFERENTIAL
Basophils Absolute: 0.1 10*3/uL (ref 0.0–0.1)
Basophils Absolute: 0 10*3/uL (ref 0.0–0.1)
Basophils Relative: 1 % (ref 0–1)
Basophils Relative: 0 % (ref 0–1)
Eosinophils Absolute: 0.2 10*3/uL (ref 0.0–0.7)
Eosinophils Absolute: 0.3 10*3/uL (ref 0.0–0.7)
Lymphs Abs: 2.3 10*3/uL (ref 0.7–4.0)
Monocytes Absolute: 1 10*3/uL (ref 0.1–1.0)
Monocytes Relative: 11 % (ref 3–12)
Neutro Abs: 3.3 10*3/uL (ref 1.7–7.7)
Neutro Abs: 2.9 10*3/uL (ref 1.7–7.7)
Neutro Abs: 6 10*3/uL (ref 1.7–7.7)
Neutrophils Relative %: 45 % (ref 43–77)
Neutrophils Relative %: 63 % (ref 43–77)

## 2010-05-08 LAB — RAPID URINE DRUG SCREEN, HOSP PERFORMED
Amphetamines: NOT DETECTED
Amphetamines: NOT DETECTED
Benzodiazepines: POSITIVE — AB
Benzodiazepines: POSITIVE — AB
Cocaine: NOT DETECTED
Cocaine: NOT DETECTED
Opiates: NOT DETECTED
Tetrahydrocannabinol: NOT DETECTED
Tetrahydrocannabinol: NOT DETECTED
Tetrahydrocannabinol: NOT DETECTED

## 2010-05-08 LAB — URINALYSIS, ROUTINE W REFLEX MICROSCOPIC
Bilirubin Urine: NEGATIVE
Bilirubin Urine: NEGATIVE
Ketones, ur: NEGATIVE mg/dL
Nitrite: NEGATIVE
Nitrite: NEGATIVE
Protein, ur: NEGATIVE mg/dL
Protein, ur: NEGATIVE mg/dL
Specific Gravity, Urine: 1.022 (ref 1.005–1.030)
Urobilinogen, UA: 0.2 mg/dL (ref 0.0–1.0)
Urobilinogen, UA: 0.2 mg/dL (ref 0.0–1.0)

## 2010-05-08 LAB — URINE CULTURE

## 2010-05-08 LAB — CBC
Hemoglobin: 15.6 g/dL (ref 13.0–17.0)
MCHC: 35 g/dL (ref 30.0–36.0)
MCHC: 34.4 g/dL (ref 30.0–36.0)
MCV: 95.3 fL (ref 78.0–100.0)
MCV: 96.1 fL (ref 78.0–100.0)
Platelets: 210 10*3/uL (ref 150–400)
RBC: 4.6 MIL/uL (ref 4.22–5.81)
RBC: 4.59 MIL/uL (ref 4.22–5.81)
RDW: 13.9 % (ref 11.5–15.5)
RDW: 14.1 % (ref 11.5–15.5)
WBC: 9.5 10*3/uL (ref 4.0–10.5)

## 2010-05-08 LAB — ETHANOL
Alcohol, Ethyl (B): 148 mg/dL — ABNORMAL HIGH (ref 0–10)
Alcohol, Ethyl (B): 175 mg/dL — ABNORMAL HIGH (ref 0–10)
Alcohol, Ethyl (B): 66 mg/dL — ABNORMAL HIGH (ref 0–10)

## 2010-05-08 LAB — POCT I-STAT, CHEM 8
BUN: 5 mg/dL — ABNORMAL LOW (ref 6–23)
Calcium, Ion: 1.11 mmol/L — ABNORMAL LOW (ref 1.12–1.32)
Creatinine, Ser: 1 mg/dL (ref 0.4–1.5)
Glucose, Bld: 88 mg/dL (ref 70–99)
TCO2: 23 mmol/L (ref 0–100)

## 2010-05-08 LAB — BASIC METABOLIC PANEL
BUN: 6 mg/dL (ref 6–23)
CO2: 21 mEq/L (ref 19–32)
Calcium: 8.8 mg/dL (ref 8.4–10.5)
Calcium: 8.6 mg/dL (ref 8.4–10.5)
Calcium: 9 mg/dL (ref 8.4–10.5)
Chloride: 101 mEq/L (ref 96–112)
Creatinine, Ser: 0.95 mg/dL (ref 0.4–1.5)
Creatinine, Ser: 1.01 mg/dL (ref 0.4–1.5)
Creatinine, Ser: 0.95 mg/dL (ref 0.4–1.5)
GFR calc Af Amer: >60 mL/min (ref >60)
GFR calc Af Amer: >60 mL/min (ref >60)
GFR calc Af Amer: >60 mL/min (ref >60)
GFR calc non Af Amer: >60 mL/min (ref >60)

## 2010-05-08 LAB — HEMOCCULT GUIAC POC 1CARD (OFFICE): Fecal Occult Bld: NEGATIVE

## 2010-05-08 LAB — ACETAMINOPHEN LEVEL: Acetaminophen (Tylenol), Serum: <10 ug/mL — ABNORMAL LOW (ref 10–30)

## 2010-05-09 LAB — CBC
HCT: 44.5 % (ref 39.0–52.0)
HCT: 47.1 % (ref 39.0–52.0)
HCT: 41 % (ref 39.0–52.0)
HCT: 41.2 % (ref 39.0–52.0)
Hemoglobin: 15.9 g/dL (ref 13.0–17.0)
Hemoglobin: 14.3 g/dL (ref 13.0–17.0)
MCHC: 34.2 g/dL (ref 30.0–36.0)
Platelets: 215 10*3/uL (ref 150–400)
Platelets: 251 10*3/uL (ref 150–400)
Platelets: 183 10*3/uL (ref 150–400)
RDW: 13.8 % (ref 11.5–15.5)
RDW: 14.3 % (ref 11.5–15.5)
RDW: 13.6 % (ref 11.5–15.5)
RDW: 13.8 % (ref 11.5–15.5)
WBC: 5.7 10*3/uL (ref 4.0–10.5)
WBC: 3.7 10*3/uL — ABNORMAL LOW (ref 4.0–10.5)

## 2010-05-09 LAB — POCT I-STAT, CHEM 8
BUN: <3 mg/dL — ABNORMAL LOW (ref 6–23)
Chloride: 105 mEq/L (ref 96–112)
Creatinine, Ser: 0.9 mg/dL (ref 0.4–1.5)
Glucose, Bld: 91 mg/dL (ref 70–99)
HCT: 43 % (ref 39.0–52.0)
Potassium: 3.7 mEq/L (ref 3.5–5.1)

## 2010-05-09 LAB — DIFFERENTIAL
Basophils Absolute: 0 10*3/uL (ref 0.0–0.1)
Basophils Relative: 1 % (ref 0–1)
Lymphocytes Relative: 28 % (ref 12–46)
Lymphocytes Relative: 37 % (ref 12–46)
Lymphocytes Relative: 46 % (ref 12–46)
Lymphs Abs: 1.6 10*3/uL (ref 0.7–4.0)
Lymphs Abs: 2.2 10*3/uL (ref 0.7–4.0)
Lymphs Abs: 1.7 10*3/uL (ref 0.7–4.0)
Monocytes Absolute: 0.9 10*3/uL (ref 0.1–1.0)
Monocytes Absolute: 0.4 10*3/uL (ref 0.1–1.0)
Monocytes Absolute: 0.9 10*3/uL (ref 0.1–1.0)
Monocytes Relative: 15 % — ABNORMAL HIGH (ref 3–12)
Monocytes Relative: 19 % — ABNORMAL HIGH (ref 3–12)
Neutro Abs: 2.8 10*3/uL (ref 1.7–7.7)
Neutro Abs: 3.1 10*3/uL (ref 1.7–7.7)
Neutro Abs: 1.4 10*3/uL — ABNORMAL LOW (ref 1.7–7.7)
Neutro Abs: 1.9 10*3/uL (ref 1.7–7.7)
Neutrophils Relative %: 47 % (ref 43–77)
Neutrophils Relative %: 54 % (ref 43–77)

## 2010-05-09 LAB — URINALYSIS, ROUTINE W REFLEX MICROSCOPIC
Bilirubin Urine: NEGATIVE
Bilirubin Urine: NEGATIVE
Glucose, UA: NEGATIVE mg/dL
Glucose, UA: NEGATIVE mg/dL
Hgb urine dipstick: NEGATIVE
Hgb urine dipstick: NEGATIVE
Nitrite: NEGATIVE
Specific Gravity, Urine: 1.01 (ref 1.005–1.030)
Specific Gravity, Urine: 1.008 (ref 1.005–1.030)
Specific Gravity, Urine: 1.02 (ref 1.005–1.030)
Urobilinogen, UA: 0.2 mg/dL (ref 0.0–1.0)
pH: 5.5 (ref 5.0–8.0)
pH: 5 (ref 5.0–8.0)
pH: 5 (ref 5.0–8.0)

## 2010-05-09 LAB — ETHANOL
Alcohol, Ethyl (B): 243 mg/dL — ABNORMAL HIGH (ref 0–10)
Alcohol, Ethyl (B): 283 mg/dL — ABNORMAL HIGH (ref 0–10)
Alcohol, Ethyl (B): 231 mg/dL — ABNORMAL HIGH (ref 0–10)
Alcohol, Ethyl (B): 255 mg/dL — ABNORMAL HIGH (ref 0–10)

## 2010-05-09 LAB — COMPREHENSIVE METABOLIC PANEL
ALT: 30 U/L (ref 0–53)
ALT: 19 U/L (ref 0–53)
Albumin: 3.9 g/dL (ref 3.5–5.2)
Albumin: 4 g/dL (ref 3.5–5.2)
Albumin: 3.7 g/dL (ref 3.5–5.2)
Alkaline Phosphatase: 60 U/L (ref 39–117)
Alkaline Phosphatase: 69 U/L (ref 39–117)
Alkaline Phosphatase: 54 U/L (ref 39–117)
Alkaline Phosphatase: 65 U/L (ref 39–117)
BUN: 5 mg/dL — ABNORMAL LOW (ref 6–23)
BUN: 7 mg/dL (ref 6–23)
CO2: 25 mEq/L (ref 19–32)
Calcium: 8.9 mg/dL (ref 8.4–10.5)
Chloride: 103 mEq/L (ref 96–112)
Chloride: 108 mEq/L (ref 96–112)
Creatinine, Ser: 0.81 mg/dL (ref 0.4–1.5)
GFR calc non Af Amer: >60 mL/min (ref >60)
Glucose, Bld: 88 mg/dL (ref 70–99)
Glucose, Bld: 93 mg/dL (ref 70–99)
Glucose, Bld: 81 mg/dL (ref 70–99)
Potassium: 3.6 mEq/L (ref 3.5–5.1)
Potassium: 3.6 mEq/L (ref 3.5–5.1)
Potassium: 3.3 mEq/L — ABNORMAL LOW (ref 3.5–5.1)
Potassium: 3.7 mEq/L (ref 3.5–5.1)
Sodium: 141 mEq/L (ref 135–145)
Sodium: 142 mEq/L (ref 135–145)
Sodium: 141 mEq/L (ref 135–145)
Total Bilirubin: 0.4 mg/dL (ref 0.3–1.2)
Total Protein: 6.8 g/dL (ref 6.0–8.3)
Total Protein: 6.8 g/dL (ref 6.0–8.3)
Total Protein: 6.2 g/dL (ref 6.0–8.3)
Total Protein: 6.4 g/dL (ref 6.0–8.3)

## 2010-05-09 LAB — PROTIME-INR: Prothrombin Time: 14.5 seconds (ref 11.6–15.2)

## 2010-05-09 LAB — RAPID URINE DRUG SCREEN, HOSP PERFORMED
Amphetamines: NOT DETECTED
Barbiturates: NOT DETECTED
Benzodiazepines: POSITIVE — AB
Benzodiazepines: NOT DETECTED
Cocaine: NOT DETECTED
Cocaine: NOT DETECTED
Cocaine: NOT DETECTED
Opiates: NOT DETECTED
Opiates: NOT DETECTED
Opiates: NOT DETECTED
Tetrahydrocannabinol: NOT DETECTED
Tetrahydrocannabinol: NOT DETECTED

## 2010-05-09 LAB — LIPASE, BLOOD: Lipase: 22 U/L (ref 11–59)

## 2010-05-09 LAB — URINE CULTURE
Colony Count: NO GROWTH
Culture: NO GROWTH

## 2010-05-09 LAB — APTT: aPTT: 27 seconds (ref 24–37)

## 2010-05-09 LAB — SALICYLATE LEVEL: Salicylate Lvl: <4 mg/dL (ref 2.8–20.0)

## 2010-05-09 LAB — ACETAMINOPHEN LEVEL: Acetaminophen (Tylenol), Serum: <10 ug/mL — ABNORMAL LOW (ref 10–30)

## 2010-06-14 ENCOUNTER — Emergency Department (HOSPITAL_COMMUNITY)
Admission: EM | Admit: 2010-06-14 | Discharge: 2010-06-15 | Disposition: A | Discharge status: Home / Self Care | Payer: Self-pay | Attending: Emergency Medicine | Admitting: Emergency Medicine

## 2010-06-14 DIAGNOSIS — Z046 Encounter for general psychiatric examination, requested by authority: Secondary | ICD-10-CM | POA: Insufficient documentation

## 2010-06-14 DIAGNOSIS — I10 Essential (primary) hypertension: Secondary | ICD-10-CM | POA: Insufficient documentation

## 2010-06-14 DIAGNOSIS — R109 Unspecified abdominal pain: Secondary | ICD-10-CM | POA: Insufficient documentation

## 2010-06-14 DIAGNOSIS — F4320 Adjustment disorder, unspecified: Secondary | ICD-10-CM

## 2010-06-14 DIAGNOSIS — F1010 Alcohol abuse, uncomplicated: Secondary | ICD-10-CM | POA: Insufficient documentation

## 2010-06-14 LAB — COMPREHENSIVE METABOLIC PANEL
ALT: 20 U/L (ref 0–53)
Calcium: 8.7 mg/dL (ref 8.4–10.5)
Creatinine, Ser: 0.73 mg/dL (ref 0.4–1.5)
Glucose, Bld: 81 mg/dL (ref 70–99)
Sodium: 140 mEq/L (ref 135–145)
Total Protein: 7.3 g/dL (ref 6.0–8.3)

## 2010-06-14 LAB — RAPID URINE DRUG SCREEN, HOSP PERFORMED
Amphetamines: NOT DETECTED
Benzodiazepines: NOT DETECTED
Tetrahydrocannabinol: NOT DETECTED

## 2010-06-14 LAB — URINALYSIS, ROUTINE W REFLEX MICROSCOPIC
Hgb urine dipstick: NEGATIVE
Leukocytes, UA: NEGATIVE
Nitrite: NEGATIVE
Protein, ur: 30 mg/dL — AB
Specific Gravity, Urine: 1.028 (ref 1.005–1.030)
Urobilinogen, UA: 0.2 mg/dL (ref 0.0–1.0)

## 2010-06-14 LAB — ETHANOL: Alcohol, Ethyl (B): 208 mg/dL — ABNORMAL HIGH (ref 0–10)

## 2010-06-14 LAB — DIFFERENTIAL
Basophils Absolute: 0 10*3/uL (ref 0.0–0.1)
Basophils Relative: 1 % (ref 0–1)
Eosinophils Relative: 2 % (ref 0–5)
Monocytes Absolute: 0.5 10*3/uL (ref 0.1–1.0)

## 2010-06-14 LAB — CBC
HCT: 46.5 % (ref 39.0–52.0)
MCH: 31.9 pg (ref 26.0–34.0)
MCHC: 34.6 g/dL (ref 30.0–36.0)
RDW: 12.6 % (ref 11.5–15.5)

## 2010-06-14 LAB — URINE MICROSCOPIC-ADD ON

## 2010-06-14 LAB — LIPASE, BLOOD: Lipase: 34 U/L (ref 11–59)
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
## 2010-06-15 NOTE — Discharge Summary (Signed)
[name], [name] NO.:  [id number]   MEDICAL RECORD NO.:  [id number]          PATIENT TYPE:  IPS   LOCATION:  0501                          [facility]ILITY[facility]  [facility]   PHYSICIAN:  Jasmine Pang, M.D. DATE OF BIRTH:  01/31/1966   DATE OF ADMISSION:  09/11/2008  DATE OF DISCHARGE:  09/18/2008                               DISCHARGE SUMMARY   IDENTIFICATION:  This is a 45 year old single white male who was  admitted on a voluntary basis on September 11, 2008.   HISTORY OF PRESENT ILLNESS:  The patient presents with a history of  alcohol dependence and depression.  He states has been drinking up to 12-  14 beers daily.  He relapsed shortly after he was discharged from his  last hospitalization.  The patient reports his stressors have been a  breakup with his girlfriend and he is now homeless.  The patient has  been here before and was recently discharged approximately about a week  to 2 weeks ago.  Again, he has been drinking up to 12-14 beers daily.  He denies any seizure activity.  For further admission information, see  psychiatric admission assessment.   Initially, an Axis I diagnosis of alcohol dependence was given as well  as an Axis I diagnosis of depressive disorder not otherwise specified.  On Axis III, he was given a diagnosis of history of hypertension.   PHYSICAL FINDINGS:  There were no acute physical or medical problems  noted.  The patient was fully assessed at the [facility] ED.   ADMISSION LABORATORIES:  Shows an alcohol level of 291.  CBC was within  normal limits and potassium was 3.4.   HOSPITAL COURSE:  Upon admission, the patient was restarted on his home  medications of multivitamins 1 tablet daily, thiamine 100 mg p.o. daily,  folic acid 1 mg daily, and Librium 25 mg p.o. q.6 h. p.r.n. anxiety or  withdrawal.  In individual sessions, the patient was initially fully  alert and cooperative with good eye contact, was casually dressed.  Speech was  clear and normal pace and tone.  The patient's mood was  depressed.  He was tearful throughout most of the interview wanting to  get help.  His thought processes were coherent.  He was focused on  getting a place to stay, so he can save some money in pay for his child  support.  Cognition appeared to be intact.  As hospitalization  progressed, he continued to feel depressed and anxious feeling really  depressed.  He was having difficulty sleeping and was started on  trazodone 100 mg p.o. q.h.s. p.r.n. insomnia may repeat x1 p.r.n.  He  was worried about losing his job due to the hospitalization.  He  discussed the conflicts with his girlfriend I need to stay out of  relationships for a month.  He talked about his drinking and his  negative consequences.  He wanted to maintain sobriety when he left.  As  hospitalization progressed, he became less depressed, so he was still  very anxious on September 14, 2008 and wants a  medicine for depression. He  discussed with his daughter who is 29 years old, he wants to see more of  her, he is tearful when he discussed this.  On September 17, 2008, sleep  was good and appetite was good.  He stated he planned to go home  tomorrow.  He was going to go back to work and his boss was going to  pick him up.  On September 18, 2008, mental status had improved.  The  patient did begin to talk about wanting to be hospitalized until his  employer returns from [facility].  He was told that hospital cannot  keep the patient for housing reasons and does not have criteria to keep  him.  Counselor also reminded the patient that he has some more cover  days and would have to pay for the remaining time since he does not meet  criteria.  The patient stated he was ready to go.  He stated that he has  900 dollars in his wallet and will spend this on alcohol.  He was  reminded that it was his responsibility and choice in the matter and  suggested that the patient used some of the  money for hotel room, he  agreed to do this.  Mood was less depressed.  Affect consistent with  mood.  There was no suicidal or homicidal ideation.  No thoughts of self-  injurious behavior.  No auditory or visual hallucinations.  No paranoia  or delusions.  Thoughts were logical and goal-directed.  Thought  content, no predominant theme.  Cognitive was grossly intact.  The  patient was felt to be safe for discharge today.  Again, there was no  suicidal ideation.   DISCHARGE DIAGNOSES:  Axis I:  Alcohol dependence. Depressive disorder  not otherwise specified.  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  Axis III:  History of hypertension.  Axis IV:  Severe (problems with housing, financial issues, other  psychosocial problems with chronic substance use, and poor social  support).  Axis V:  Global assessment of functioning was 50 upon discharge.  GAF  was 40 upon admission.  GAF highest past year was 60-65.   DISCHARGE PLANS:  There were no specific activity level or dietary  restrictions.   POSTHOSPITAL CARE PLANS:  The patient will be seen at the [facility] on Thursday, October 09, 2008 at 10 a.m. and go to [facility] on  Tuesday, September 23, 2008 at 3 p.m.   DISCHARGE MEDICATIONS:  Trazodone 100 mg at bedtime.      Jasmine Pang, M.D.  Electronically Signed     BHS/MEDQ  D:  09/30/2008  T:  10/01/2008  Job:  [id number]

## 2010-06-15 NOTE — H&P (Signed)
[name], [name] NO.:  [id number]   MEDICAL RECORD NO.:  [id number]          PATIENT TYPE:  IPS   LOCATION:  0501                          [facility]ILITY[facility]  [facility]   PHYSICIAN:  Geoffery Lyons, M.D.      DATE OF BIRTH:  November 22, 1965   DATE OF ADMISSION:  09/11/2008  DATE OF DISCHARGE:                       PSYCHIATRIC ADMISSION ASSESSMENT   A 45 year old male voluntarily admitted on September 11, 2008.   HISTORY OF PRESENT ILLNESS:  The patient presents with a history of  alcohol dependence and depression.  States he has been drinking 12-14  beers, relapsed shortly after he was discharged.  The patient reports  his stressors have been a breakup with girlfriend and is now homeless.   PAST PSYCHIATRIC HISTORY:  The patient has been here before and recently  discharged approximately a week to 2 ago.   SOCIAL HISTORY:  The patient considers himself homeless, unable to stay  with the uncle whom he was living with prior.  He is single and works at  [employer].   FAMILY HISTORY:  None.   ALCOHOL AND DRUG HISTORY:  Again, he has been drinking up to 12-14  beers.  Denies any seizure activity.  Denies any other substance use.   PRIMARY CARE [name]:  None.   MEDICAL PROBLEMS:  History of hypertension.   MEDICATIONS:  He was discharged on:  1. Multivitamin.  2. Folic acid.   DRUG ALLERGIES:  NO KNOWN ALLERGIES.   PHYSICAL EXAMINATION:  The patient was fully assessed at [facility].  This is a middle-aged male, appears in no acute distress.  He did receive Librium and Zofran during his emergency room stay.   LABORATORY DATA:  Shows an alcohol level of 291.  CBC within normal  limits and a potassium of 3.4.   MENTAL STATUS EXAM:  The patient is fully alert and cooperative, good  eye contact.  He is casually dressed.  Speech is clear, normal pace and  tone.  The patient's mood is depressed.  The patient did get tearful  throughout most of the interview,  wanting to get help.  His thought  processes are coherent, focused on getting a place to stay so he can  save some money and pay for his child support.  His cognitive function  is intact.  His memory appears intact.  Judgment and insight are fair.   AXIS I:  1.  Alcohol dependence.  2.  Depressive disorder not otherwise  specified.  AXIS II:  Deferred.  AXIS III:  History of hypertension.  AXIS IV:  1.  Problems with housing, financial issues.  2.  Other  psychosocial problems with chronic substance use and poor social  support.  AXIS V:  Current is 40.   PLAN:  To have Librium available for withdrawal symptoms.  Work on  relapse prevention.  We will assess comorbidities.  The patient may  benefit from an antidepressant.  Case manager will assess his living  situation.  His tentative length of stay at this time is 3-4 days.      Landry Corporal, N.P.  Geoffery Lyons, M.D.  Electronically Signed    JO/MEDQ  D:  09/12/2008  T:  09/12/2008  Job:  [id number]

## 2010-06-15 NOTE — Discharge Summary (Signed)
[name], [name] [name].:  [id number]   MEDICAL RECORD [name].:  [id number]          PATIENT TYPE:  IPS   LOCATION:  0301                          [facility]  [facility]   PHYSICIAN:  Jasmine Pang, M.D. DATE OF BIRTH:  [birth year]   DATE OF ADMISSION:  08/03/2008  DATE OF DISCHARGE:  08/07/2008                               DISCHARGE SUMMARY   IDENTIFICATION:  This is a 45 year old single white male who was  admitted on a voluntary basis.   HISTORY OF PRESENT ILLNESS:  This is the first [facility] admission for this 26-  year-old who presents with a history of alcohol abuse.  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  He said he is having to drink  alcohol every day to keep the sweats and diarrhea at bay.  He recognizes  that he is very addicted to alcohol and says that he has been addicted  since he was about 45 year old.  He drinks as many beers as he can get  he can get his hands on, most heavily on Friday after receiving his  paycheck.  He has episodes of sobriety in the past and his drinking has  been much worse now for the past 2 months since he was kicked out of the  house by his girlfriend because of his drinking.  He has now been  homeless for the past 2 months and has been in a shelter for a time  until his days were used off.  He is now living on the street.  He  adamantly denies any other substance abuse.  For further admission  information, see psychiatric admission assessment.   DIAGNOSES:  Initially, he was given an:  Axis I:  Alcohol abuse and dependence.  Axis III:  He was given a diagnosis of foot lesions, not otherwise  specified.   PHYSICAL FINDINGS:  There were [name] acute physical or medical problems  noted.  There were [name] obvious signs of withdrawal today and he is a well-  nourished male in [name] distress.   DIAGNOSTIC STUDIES:  CBC was remarkable for WBC of 3.7,  hemoglobin 14.3,  hematocrit of 41.2, and platelets of 189,000.  Alcohol level at 233 on  initial presentation.  Salicylate and acetaminophen levels were  negative.  Chemistries revealed a sodium of 141, potassium of 3.7,  chloride of 108, CO2 25, BUN 4, creatinine 0.81, and random glucose 81.  Liver enzymes were remarkable for an SGOT of 23 and SGPT of 19, alkaline  phosphatase of 55, and a total bilirubin of 0.4.   HOSPITAL COURSE:  Upon admission, the patient was started on Librium  detox protocol.  He was also started on Ambien 10 mg p.o. q.h.s. p.r.n.  insomnia.  He was on [name] other medications prior to admission.  Due to  his low heart rate, Librium was held much of the time during the  hospitalization here.  He did not show any symptoms of withdrawal.  In  individual sessions, the  patient was casually dressed with good eye  contact.  Speech was normal, rate, and flow, although at times somewhat  pressured.  Psychomotor activity was within normal limits.  There was [name]  suicidal ideation.  Mood was depressed and anxious.  Affect was  consistent with mood.  There was [name] evidence of psychosis or thought  disorder.  As hospitalization progressed, the patient discussed  problems with alcohol.  He states his parents were alcoholic.  He  wanted a 30-day rehab or a halfway house to go to after he leaves the  hospital.  On August 06, 2008, the patient was less depressed, less  anxious.  He was wanting to go home tomorrow.  He was excited about the  fact that his boss was going to pick him up and help him find a place to  live.  He was sleeping good and eating good.  On August 07, 2008, mood was  euthymic.  Affect was consistent with mood with [name] suicidal or homicidal  ideation, [name] thoughts of self-injurious behavior.  [name] paranoia or  delusions.  Thoughts were logical and goal directed.  Cognitive was back  to baseline. Insight good, judgment good.  Impulse control good.  The  patient wanted to go  home today and was felt to be safe for discharge.   ADMISSION DIAGNOSES:  Axis I:  Alcohol dependence.  Axis II:  Features of personality disorder, not otherwise specified.  Axis III:  Foot lesions, not otherwise specified.  Axis IV:  Severe (issues with homelessness, burden of chemical  dependence illness, burden of other psychosocial stressors).  Axis V:  Global assessment of functioning was 50 upon discharge.  Global  assessment of functioning was 44 upon admission.  Global assessment of  functioning highest past year was 60.   DISCHARGE PLAN:  There was [name] specific activity level or dietary  restrictions.   POST HOSPITAL CARE PLANS:  The patient will go to the [facility] on  September 09, 2008, at 3 o'clock.   DISCHARGE MEDICATIONS:  [name] routine medications started here.  He will be  dispensed on EpiPen because he has a history of bee sting anaphylaxis  and works as a Administrator.  He is to use as directed.  He is to call 911  if he has to use it.      Jasmine Pang, M.D.  Electronically Signed     BHS/MEDQ  D:  08/07/2008  T:  08/08/2008  Job:  [id number]

## 2010-06-15 NOTE — Discharge Summary (Signed)
[name], [name] NO.:  [id number]   MEDICAL RECORD NO.:  [id number]          PATIENT TYPE:  IPS   LOCATION:  0505                          [facility]ACILITY:  [facility]   PHYSICIAN:  Geoffery Lyons, M.D.      DATE OF BIRTH:  20-Feb-1965   DATE OF ADMISSION:  08/29/2008  DATE OF DISCHARGE:  09/04/2008                               DISCHARGE SUMMARY   CHIEF COMPLAINT/PRESENT ILLNESS:  This was the third back-to-back  admission to [facility] for this 45 year old male  voluntarily admitted.  He endorsed that he relapsed after he learned  that an ex-girlfriend took an overdose.   PAST PSYCHIATRIC HISTORY:  Persistent alcohol use.  No significant  sobriety as of lately.   ALCOHOL AND DRUG HISTORY:  Persistent alcohol.   MEDICAL HISTORY:  Noncontributory.   MEDICATIONS:  None.   Physical exam failed to show any acute findings.   LABORATORY WORKUP:  Alcohol upon admission 283.  UDS positive for  benzodiazepines.   MENTAL STATUS EXAM:  Reveals an alert cooperative male.  Mood anxious.  Affect anxious.  Thought processes logical, coherent and relevant.  Endorsed being overwhelmed with the loss of this ex-girlfriend.  Endorsed he could not handle it, so he relapsed.  No active suicidal or  homicidal ideas, no delusions, no hallucinations.  Cognition well-  preserved.   ADMITTING DIAGNOSES:  Axis I:  Alcohol dependence.  Depressive disorder,  not otherwise specified.  Axis II:  No diagnosis.  Axis III:  No diagnosis.  Axis IV:  Moderate.  Axis V:  Global Assessment of Functioning upon admission 35, highest  Global Assessment of Functioning in the last year 60.   COURSE IN THE HOSPITAL:  He was admitted and started in individual and  group psychotherapy.  He was detoxified with Librium.  As already  stated, he endorsed that he got really upset after he found out that the  ex-girlfriend overdosed, tearful, grieving the loss of the ex-girlfriend  and upset  because his actual girlfriend was not supportive.  Distancing  himself from __________.  He claims that he later found out that the ex-  girlfriend did not die.  He was insightful in saying that he really  needed to learn to deal with stressors without drinking.  He endorsed  that the situation was not going to change.  He was going to still be  able to go back and work with his employer.  He was unclear where he was  going to stay because apparently he was not going to be able to stay  with his uncle anymore.  By September 03, 2008, he had finalized the  arrangement with his employer that his employer was going to pick him up  the first thing in the morning and he was going to be able to stay in a  place that his employer was going to find for him.  As he was in full  contact reality without any active suicidal or homicidal ideas, we went  ahead and discharged to outpatient follow-up.   DISCHARGE DIAGNOSES:  Axis  I:  Alcohol dependence.  Depressive disorder,  not otherwise specified.  Axis II:  No diagnosis.  Axis III:  No diagnosis.  Axis IV:  Moderate.  Axis V:  Global Assessment of Functioning upon discharge 50.   Discharged on no medication.  Follow-up through the [facility].      Geoffery Lyons, M.D.  Electronically Signed     IL/MEDQ  D:  10/01/2008  T:  10/02/2008  Job:  [id number]

## 2010-06-15 NOTE — H&P (Signed)
[name], [name] NO.:  [id number]   MEDICAL RECORD NO.:  [id number]          PATIENT TYPE:  IPS   LOCATION:  0599                          FACILITY:  [facility]   PHYSICIAN:  Geoffery Lyons, M.D.      DATE OF BIRTH:  [birth year]   DATE OF ADMISSION:  08/18/2008  DATE OF DISCHARGE:                       PSYCHIATRIC ADMISSION ASSESSMENT   IDENTIFICATION:  This is a 45 year old male, single.  This is a  voluntary admission.   HISTORY OF PRESENT ILLNESS:  Second [facility] admission for this 45 year old  who was previously at [facility] July 4 through July 8 for alcohol  intoxication.  At that time,  he had been living on the streets for several days after being kicked  out of his house by his girlfriend on June 2.  He was detoxed with plans  to attend [facility] at discharge on July 8.  Thought that he was going to stay  temporarily with friends as arranged by his employer.  However, he  relapsed on alcohol by July 12 and has been drinking since that time.  Denies any other substance abuse.  Denies suicidal thoughts but endorses  being hopeless and depressed, unable to stay abstinent from alcohol.  He  is requesting detox and help making a relapse prevention plan.   PAST PSYCHIATRIC HISTORY:  Second [facility] admission.  He has a history of a  persistent alcohol use since around age 47.  No history of DT's.  No  history of liver disease or seizures.  He reports that he was possibly  sober as long as 8 months after some treatment several years ago in  [place]. He has also been treated at [facility] in [place] in the past  and no history of suicide attempts.  He denies any other substance abuse  currently or in the past.   SOCIAL HISTORY:  Single male raised in [place], [place].  He has one  38-year-old daughter who is living in [place] with her mother and is a  child from a previous relationship.  He is currently homeless.  No known  legal charges.  Never married.  Was previously living with  his  girlfriend here June 2 when she kicked him out of the house because of a  drinking problem.  From there, he went to the local homeless shelter,  used his 30 days and ended up back out on the streets.  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  History of learning disability, has been in  special education classes all his life.  He has no history of IV drug  use.  No history of brain injury or coma.   Marland Kitchen   FAMILY HISTORY:  Significant for mother with significant alcohol abuse.   MEDICAL HISTORY:  No primary care [name].   MEDICAL PROBLEMS:  Currently none.   PAST MEDICAL HISTORY:  Significant for hospitalization this spring for  episode of anaphylaxis following a bee sting.   CURRENT MEDICATIONS:  None.  He does carry an Epi pen.  DRUG ALLERGIES:  None.   PHYSICAL EXAMINATION:  GENERAL:  Well-nourished male in no acute  distress, somewhat disheveled, appears to be in detox, cooperative but  depressed and tearful about his situation.   PHYSICAL EXAMINATION:  Done in the emergency room as noted in the  record.   LABORATORY DATA:  Urine drug screen positive for benzodiazepines.  Alcohol level was 243.  CBC, liver enzymes and chemistries all within  normal limits.   MENTAL STATUS EXAM:  Fully alert male, cooperative, tearful affect,  looks depressed, says life is not worth living on the streets.  Feels he  cannot cope.  Insight limited, positive for passive suicidal thoughts  without an active plan.  Wants to re-achieve sobriety, get back to work,  seeking a stable place to live.  Feeling fairly hopeless.  No evidence  of hallucinations or delirium.  Intellectual function is adequate.  Insight poor, impulse control and judgment within normal limits.   DISCHARGE DIAGNOSES:  AXIS I:  Depressive disorder NOS.  Alcohol abuse  and dependence.  AXIS II:  No diagnosis.  AXIS III:  No diagnosis.  AXIS IV:   Severe issues with homelessness.  AXIS V:  Current 44, past year 16.  Estimated plan is to voluntarily  admit him to our dual diagnosis program.  We will detox him with a  Librium protocol and see if we can review his previous discharge plan,  enhance that again to a stable situation.      Margaret A. Scott, N.P.      Geoffery Lyons, M.D.  Electronically Signed    MAS/MEDQ  D:  08/19/2008  T:  08/19/2008  Job:  [id number]

## 2010-06-17 ENCOUNTER — Encounter (HOSPITAL_COMMUNITY): Payer: Self-pay

## 2010-06-17 ENCOUNTER — Emergency Department (HOSPITAL_COMMUNITY)
Admission: EM | Admit: 2010-06-17 | Discharge: 2010-06-17 | Disposition: A | Discharge status: Home / Self Care | Payer: Self-pay | Attending: Emergency Medicine | Admitting: Emergency Medicine

## 2010-06-17 ENCOUNTER — Emergency Department (HOSPITAL_COMMUNITY): Payer: Self-pay

## 2010-06-17 DIAGNOSIS — I10 Essential (primary) hypertension: Secondary | ICD-10-CM | POA: Insufficient documentation

## 2010-06-17 DIAGNOSIS — Z79899 Other long term (current) drug therapy: Secondary | ICD-10-CM | POA: Insufficient documentation

## 2010-06-17 DIAGNOSIS — F3130 Bipolar disorder, current episode depressed, mild or moderate severity, unspecified: Secondary | ICD-10-CM | POA: Insufficient documentation

## 2010-06-17 DIAGNOSIS — R5381 Other malaise: Secondary | ICD-10-CM | POA: Insufficient documentation

## 2010-06-17 DIAGNOSIS — H538 Other visual disturbances: Secondary | ICD-10-CM | POA: Insufficient documentation

## 2010-06-17 DIAGNOSIS — R5383 Other fatigue: Secondary | ICD-10-CM | POA: Insufficient documentation

## 2010-06-17 LAB — URINALYSIS, ROUTINE W REFLEX MICROSCOPIC
Glucose, UA: NEGATIVE mg/dL
Hgb urine dipstick: NEGATIVE
Ketones, ur: NEGATIVE mg/dL
Protein, ur: NEGATIVE mg/dL
pH: 5.5 (ref 5.0–8.0)

## 2010-06-17 LAB — CBC
HCT: 39.7 % (ref 39.0–52.0)
Hemoglobin: 13 g/dL (ref 13.0–17.0)
MCHC: 32.7 g/dL (ref 30.0–36.0)
WBC: 4.9 10*3/uL (ref 4.0–10.5)

## 2010-06-17 LAB — DIFFERENTIAL
Basophils Absolute: 0 10*3/uL (ref 0.0–0.1)
Basophils Relative: 0 % (ref 0–1)
Lymphocytes Relative: 40 % (ref 12–46)
Neutro Abs: 1.9 10*3/uL (ref 1.7–7.7)
Neutrophils Relative %: 38 % — ABNORMAL LOW (ref 43–77)

## 2010-06-17 LAB — BASIC METABOLIC PANEL
CO2: 29 mEq/L (ref 19–32)
Calcium: 8.4 mg/dL (ref 8.4–10.5)
GFR calc Af Amer: >60 mL/min (ref >60)
GFR calc non Af Amer: >60 mL/min (ref >60)
Glucose, Bld: 89 mg/dL (ref 70–99)
Potassium: 3.8 mEq/L (ref 3.5–5.1)
Sodium: 138 mEq/L (ref 135–145)

## 2010-06-18 LAB — URINE CULTURE
Colony Count: NO GROWTH
Culture  Setup Time: 201205171050
Culture: NO GROWTH

## 2010-06-18 NOTE — Discharge Summary (Signed)
NAME:  [name], [name]                           ACCOUNT NO.:  [id number]   MEDICAL RECORD NO.:  [id number]                   PATIENT TYPE:  OBV   LOCATION:  3735                                 FACILITY:  [facility]   PHYSICIAN:  Mobolaji B. Corky Downs, M.D.            DATE OF BIRTH:  1965/09/09   DATE OF ADMISSION:  09/02/2003  DATE OF DISCHARGE:  09/04/2003                                 DISCHARGE SUMMARY   PRIMARY CARE PHYSICIAN:  Unassigned.  The patient will follow up at  [facility].   FINAL DIAGNOSIS:  Bee sting anaphylaxis.   CHIEF COMPLAINT:  Shortness of breath and swollen throat.   History and physical as documented in the H&P.  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  He presented with shortness of breath and  feeling of throat closing up after being stung by a bee.  This is about the  10th episode that he has had and each episode seems to be increasingly  getting worse.  The patient was admitted via the emergency department where  he received IV Solu-Medrol, Pepcid, Benadryl and oxygen.  He did not require  intubation.   PAST MEDICAL HISTORY:  1. Previous bee sting.  2. Urticaria.   MEDICATIONS:  None.   FAMILY/SOCIAL HISTORY:  As documented in the H&P.   PHYSICAL EXAMINATION:  GENERAL APPEARANCE:  A well-nourished, not in any  obvious respiratory distress.  VITAL SIGNS:  On initial evaluation, the patient was afebrile, pulse rate  was 99, respiratory rate 18, blood pressure 78/63 which improved to 111/65,  O2 saturation 94%.  HEENT:  Pupils are equal, round and reactive to light.  LUNGS:  Clear clinically to auscultation.  CARDIOVASCULAR:  S1 and S2 regular.  ABDOMEN:  Nondistended, soft and nontender with no hepatosplenomegaly.  SKIN:  No skin rash, urticaria has subsided.   HOSPITAL COURSE:  The patient continued to receive intravenous Solu-Medrol,  p.o. Flagyl, p.o. Pepcid IV.  His blood pressure remained stable.   On further questioning,  the patient did say that he could not afford EpiPen  and that is why he does not keep one and he does not have any primary care  physician.  He was counseled on the need to hook up with a primary care  physician and possibly follow up with an allergist/immunologist and the need  to have a bracelet.  The care manager was involved for __________ services.  The patient was instructed to follow up with the [facility].  Attempts at  getting EpiPen injection from the pharmacy here was not successful because  it was unavailable.  However, the patient did say he would be able to afford  the EpiPen injection because he is getting his salary in two days' time.  Overall, the patient did well during the hospitalization.  He was discharged  home on Benadryl 50 mg p.o. q.6h. for two days.  Prescription was written  for EpiPen one syringe and to be instructed on how to use it.  He was  instructed to avoid contact with bees, follow-up with [facility] and  arrange to see an allergist/immunologist.                                                Mobolaji B. Corky Downs, M.D.    MBB/MEDQ  D:  09/08/2003  T:  09/09/2003  Job:  [id number]   cc:   Dala Dock

## 2010-06-18 NOTE — H&P (Signed)
[name], [name]                           ACCOUNT NO.:  [id number]   MEDICAL RECORD NO.:  [id number]                   PATIENT TYPE:  OBV   LOCATION:  3735                                 FACILITY:  [facility]   PHYSICIAN:  Jonna L. Robb Matar, M.D.            DATE OF BIRTH:  Jun 04, 1965   DATE OF ADMISSION:  09/02/2003  DATE OF DISCHARGE:                                HISTORY & PHYSICAL   PRIMARY CARE PHYSICIAN:  Unassigned.   CHIEF COMPLAINT:  Shortness of breath and throat closing.   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 In the emergency room, he was treated with Solu-Medrol, Pepcid,  Benadryl and oxygen.   ALLERGIES:  BEE STINGS (but no medical allergies).   MEDICATIONS:  None.   PAST MEDICAL HISTORY:  None.   FAMILY HISTORY:  The patient did not know.   SOCIAL HISTORY:  Smokes 1 pack per day. Drinks a 12 pack per day. Engaged.  Has a 59 month old daughter.   REVIEW OF SYSTEMS:  Positive for headache, dry throat, wheezing, pruritus.  Negative for chest pain, palpitations. Other review of systems were  negative.   PHYSICAL EXAMINATION:  VITAL SIGNS:  He was afebrile. Pulse 99. Respiratory  rate 18. Blood pressure when he came in was 78/53, now it is up to 111/65.  O2 saturation 94%.  GENERAL:  A well developed, well nourished white male who was diaphoretic.  HEENT:  He has some mild facial edema, especially around the orbits and  lips. Pupils are reactive. EOMs are full. Normal hearing and pharynx.  NECK:  Shows no masses, thyromegaly, or carotid bruits.  LUNGS:  Respiratory effort is still slightly increased. The lungs have a few  scattered expiratory wheezes. No dullness.  HEART:  Normal rate and rhythm. Normal S1 and S2 without murmur, rub, or  gallop. There is no  clubbing, cyanosis, or edema.  ABDOMEN:  Non-tender. Normal bowel sounds. No hepatosplenomegaly or hernias.  GENITALIA:  External genitalia is normal. There is no cervical or inguinal  adenopathy.  EXTREMITIES:  Muscle strength is 5 over 5, all 4 extremities with full range  of motion. SKIN:  The skin shows some erythema over the bee sting on the  back of the right arm. No nodules or induration. Still seems slightly  flushed.  NEUROLOGIC:  Cranial nerves are intact. Deep tendon reflexes are 3+ and  equal. Toes go down. Sensation is normal. The patient is alert and oriented  x3. Normal memory, judgment and affect. He is feeling somewhat hyper.   LABORATORY DATA:  Normal EKG.   No other labs are available at this time.   IMPRESSION:  Anaphylaxis secondary to bee sting.   PLAN:  The  patient will continue on Solu-Medrol, H2 blocker, and Benadryl  and some of those will need to be continued for 72 hours so that the patient  can probably go home after 24 hours of observation. 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                                                Jonna L. Robb Matar, M.D.    Dorna Bloom  D:  09/02/2003  T:  09/03/2003  Job:  [id number]

## 2010-06-18 NOTE — Discharge Summary (Signed)
NAME:  [name], CAHOON                           ACCOUNT NO.:  [id number]   MEDICAL RECORD NO.:  [id number]                   PATIENT TYPE:  OBV   LOCATION:  3735                                 FACILITY:  [facility]   PHYSICIAN:  Mobolaji B. Corky Downs, M.D.            DATE OF BIRTH:  1965/02/09   DATE OF ADMISSION:  09/02/2003  DATE OF DISCHARGE:  09/04/2003                                 DISCHARGE SUMMARY   ADDENDUM:  Joneen Roach put date of admission as September 02, 2003, and date of  discharge August 4, I thought I did not include these, and attending  physician as Mobolaji B. Corky Downs, M.D.                                                Mobolaji B. Corky Downs, M.D.    MBB/MEDQ  D:  09/08/2003  T:  09/09/2003  Job:  [id number]
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
## 2010-08-09 ENCOUNTER — Emergency Department (HOSPITAL_COMMUNITY)
Admission: EM | Admit: 2010-08-09 | Discharge: 2010-08-10 | Disposition: A | Discharge status: Home / Self Care | Payer: Self-pay | Attending: Emergency Medicine | Admitting: Emergency Medicine

## 2010-08-09 DIAGNOSIS — F1010 Alcohol abuse, uncomplicated: Secondary | ICD-10-CM | POA: Insufficient documentation

## 2010-08-09 DIAGNOSIS — R45851 Suicidal ideations: Secondary | ICD-10-CM | POA: Insufficient documentation

## 2010-08-09 DIAGNOSIS — I10 Essential (primary) hypertension: Secondary | ICD-10-CM | POA: Insufficient documentation

## 2010-08-09 DIAGNOSIS — R1110 Vomiting, unspecified: Secondary | ICD-10-CM | POA: Insufficient documentation

## 2010-08-09 LAB — BASIC METABOLIC PANEL
CO2: 23 mEq/L (ref 19–32)
Chloride: 97 mEq/L (ref 96–112)
Potassium: 3.6 mEq/L (ref 3.5–5.1)
Sodium: 134 mEq/L — ABNORMAL LOW (ref 135–145)

## 2010-08-09 LAB — RAPID URINE DRUG SCREEN, HOSP PERFORMED: Opiates: NOT DETECTED

## 2010-08-09 LAB — DIFFERENTIAL
Basophils Absolute: 0 10*3/uL (ref 0.0–0.1)
Basophils Relative: 1 % (ref 0–1)
Eosinophils Absolute: 0.2 10*3/uL (ref 0.0–0.7)
Monocytes Absolute: 0.6 10*3/uL (ref 0.1–1.0)
Monocytes Relative: 13 % — ABNORMAL HIGH (ref 3–12)
Neutrophils Relative %: 43 % (ref 43–77)

## 2010-08-09 LAB — LIPASE, BLOOD: Lipase: 52 U/L (ref 11–59)

## 2010-08-09 LAB — URINALYSIS, ROUTINE W REFLEX MICROSCOPIC
Ketones, ur: NEGATIVE mg/dL
Leukocytes, UA: NEGATIVE
Nitrite: NEGATIVE
Protein, ur: NEGATIVE mg/dL

## 2010-08-09 LAB — CBC
MCH: 31.4 pg (ref 26.0–34.0)
MCHC: 35.1 g/dL (ref 30.0–36.0)
Platelets: 255 10*3/uL (ref 150–400)
RBC: 4.88 MIL/uL (ref 4.22–5.81)

## 2010-08-09 LAB — ETHANOL: Alcohol, Ethyl (B): 243 mg/dL — ABNORMAL HIGH (ref 0–11)

## 2010-10-03 ENCOUNTER — Emergency Department (HOSPITAL_COMMUNITY)
Admission: EM | Admit: 2010-10-03 | Discharge: 2010-10-04 | Disposition: A | Discharge status: Home / Self Care | Payer: Self-pay | Attending: Emergency Medicine | Admitting: Emergency Medicine

## 2010-10-03 DIAGNOSIS — R109 Unspecified abdominal pain: Secondary | ICD-10-CM | POA: Insufficient documentation

## 2010-10-03 DIAGNOSIS — F3289 Other specified depressive episodes: Secondary | ICD-10-CM | POA: Insufficient documentation

## 2010-10-03 DIAGNOSIS — I10 Essential (primary) hypertension: Secondary | ICD-10-CM | POA: Insufficient documentation

## 2010-10-03 DIAGNOSIS — F329 Major depressive disorder, single episode, unspecified: Secondary | ICD-10-CM | POA: Insufficient documentation

## 2010-10-03 DIAGNOSIS — Z046 Encounter for general psychiatric examination, requested by authority: Secondary | ICD-10-CM | POA: Insufficient documentation

## 2010-10-03 LAB — DIFFERENTIAL
Basophils Absolute: 0 10*3/uL (ref 0.0–0.1)
Eosinophils Relative: 4 % (ref 0–5)
Lymphocytes Relative: 44 % (ref 12–46)
Neutro Abs: 1.8 10*3/uL (ref 1.7–7.7)
Neutrophils Relative %: 41 % — ABNORMAL LOW (ref 43–77)

## 2010-10-03 LAB — URINALYSIS, ROUTINE W REFLEX MICROSCOPIC
Hgb urine dipstick: NEGATIVE
Nitrite: NEGATIVE
Specific Gravity, Urine: 1.007 (ref 1.005–1.030)
Urobilinogen, UA: 0.2 mg/dL (ref 0.0–1.0)

## 2010-10-03 LAB — RAPID URINE DRUG SCREEN, HOSP PERFORMED
Cocaine: NOT DETECTED
Opiates: NOT DETECTED
Tetrahydrocannabinol: NOT DETECTED

## 2010-10-03 LAB — COMPREHENSIVE METABOLIC PANEL
BUN: 7 mg/dL (ref 6–23)
CO2: 25 mEq/L (ref 19–32)
Chloride: 102 mEq/L (ref 96–112)
Creatinine, Ser: 0.65 mg/dL (ref 0.50–1.35)
GFR calc Af Amer: >60 mL/min (ref >60)
GFR calc non Af Amer: >60 mL/min (ref >60)
Total Bilirubin: 0.3 mg/dL (ref 0.3–1.2)

## 2010-10-03 LAB — CBC
HCT: 40.6 % (ref 39.0–52.0)
RBC: 4.43 MIL/uL (ref 4.22–5.81)
RDW: 12.8 % (ref 11.5–15.5)
WBC: 4.3 10*3/uL (ref 4.0–10.5)

## 2010-10-03 LAB — ETHANOL: Alcohol, Ethyl (B): 189 mg/dL — ABNORMAL HIGH (ref 0–11)

## 2010-10-03 LAB — LIPASE, BLOOD: Lipase: 21 U/L (ref 11–59)

## 2010-10-03 LAB — GLUCOSE, CAPILLARY: Glucose-Capillary: 94 mg/dL (ref 70–99)

## 2010-10-06 ENCOUNTER — Emergency Department (HOSPITAL_COMMUNITY)
Admission: EM | Admit: 2010-10-06 | Discharge: 2010-10-06 | Disposition: A | Discharge status: Home / Self Care | Payer: Self-pay | Attending: Emergency Medicine | Admitting: Emergency Medicine

## 2010-10-06 DIAGNOSIS — Z79899 Other long term (current) drug therapy: Secondary | ICD-10-CM | POA: Insufficient documentation

## 2010-10-06 DIAGNOSIS — F3130 Bipolar disorder, current episode depressed, mild or moderate severity, unspecified: Secondary | ICD-10-CM | POA: Insufficient documentation

## 2010-10-06 DIAGNOSIS — F1010 Alcohol abuse, uncomplicated: Secondary | ICD-10-CM | POA: Insufficient documentation

## 2010-10-06 DIAGNOSIS — I10 Essential (primary) hypertension: Secondary | ICD-10-CM | POA: Insufficient documentation

## 2010-10-18 ENCOUNTER — Emergency Department (HOSPITAL_COMMUNITY)
Admission: EM | Admit: 2010-10-18 | Discharge: 2010-10-21 | Disposition: A | Discharge status: Home / Self Care | Payer: Self-pay | Attending: Emergency Medicine | Admitting: Emergency Medicine

## 2010-10-18 DIAGNOSIS — F3289 Other specified depressive episodes: Secondary | ICD-10-CM | POA: Insufficient documentation

## 2010-10-18 DIAGNOSIS — IMO0002 Reserved for concepts with insufficient information to code with codable children: Secondary | ICD-10-CM | POA: Insufficient documentation

## 2010-10-18 DIAGNOSIS — F1010 Alcohol abuse, uncomplicated: Secondary | ICD-10-CM | POA: Insufficient documentation

## 2010-10-18 DIAGNOSIS — F329 Major depressive disorder, single episode, unspecified: Secondary | ICD-10-CM | POA: Insufficient documentation

## 2010-10-18 DIAGNOSIS — F411 Generalized anxiety disorder: Secondary | ICD-10-CM | POA: Insufficient documentation

## 2010-10-18 LAB — DIFFERENTIAL
Basophils Absolute: 0 10*3/uL (ref 0.0–0.1)
Eosinophils Absolute: 0.1 10*3/uL (ref 0.0–0.7)
Eosinophils Relative: 2 % (ref 0–5)

## 2010-10-18 LAB — CBC
Platelets: 220 10*3/uL (ref 150–400)
RDW: 13 % (ref 11.5–15.5)
WBC: 4.8 10*3/uL (ref 4.0–10.5)

## 2010-10-19 LAB — BASIC METABOLIC PANEL
BUN: 6 mg/dL (ref 6–23)
Calcium: 8.7 mg/dL (ref 8.4–10.5)
GFR calc Af Amer: >60 mL/min (ref >60)
GFR calc non Af Amer: >60 mL/min (ref >60)
Potassium: 3.8 mEq/L (ref 3.5–5.1)
Sodium: 139 mEq/L (ref 135–145)

## 2010-10-19 LAB — RAPID URINE DRUG SCREEN, HOSP PERFORMED: Benzodiazepines: NOT DETECTED

## 2010-10-26 LAB — URINALYSIS, ROUTINE W REFLEX MICROSCOPIC
Glucose, UA: NEGATIVE
Specific Gravity, Urine: 1.026
pH: 5

## 2010-10-26 LAB — DIFFERENTIAL
Lymphocytes Relative: 18
Lymphs Abs: 1.1
Monocytes Absolute: 0.7
Monocytes Relative: 12
Neutro Abs: 4.3

## 2010-10-26 LAB — ETHANOL: Alcohol, Ethyl (B): <5

## 2010-10-26 LAB — CBC
HCT: 41.1
Platelets: 177
RDW: 13

## 2010-10-26 LAB — RAPID URINE DRUG SCREEN, HOSP PERFORMED
Benzodiazepines: NOT DETECTED
Cocaine: NOT DETECTED
Tetrahydrocannabinol: NOT DETECTED

## 2010-10-26 LAB — COMPREHENSIVE METABOLIC PANEL
Albumin: 4.2
Alkaline Phosphatase: 78
BUN: 11
Calcium: 9.2
Potassium: 4.2
Total Protein: 7.2

## 2010-11-17 ENCOUNTER — Other Ambulatory Visit: Payer: Self-pay | Admitting: Emergency Medicine

## 2010-11-17 ENCOUNTER — Emergency Department (HOSPITAL_COMMUNITY)
Admission: EM | Admit: 2010-11-17 | Discharge: 2010-11-20 | Disposition: A | Discharge status: Home / Self Care | Payer: Self-pay | Attending: Emergency Medicine | Admitting: Emergency Medicine

## 2010-11-17 DIAGNOSIS — F329 Major depressive disorder, single episode, unspecified: Secondary | ICD-10-CM | POA: Insufficient documentation

## 2010-11-17 DIAGNOSIS — F1010 Alcohol abuse, uncomplicated: Secondary | ICD-10-CM | POA: Insufficient documentation

## 2010-11-17 DIAGNOSIS — E119 Type 2 diabetes mellitus without complications: Secondary | ICD-10-CM | POA: Insufficient documentation

## 2010-11-17 DIAGNOSIS — F3289 Other specified depressive episodes: Secondary | ICD-10-CM | POA: Insufficient documentation

## 2010-11-17 LAB — CBC
Platelets: 227 10*3/uL (ref 150–400)
RBC: 4.5 MIL/uL (ref 4.22–5.81)
RDW: 13 % (ref 11.5–15.5)
WBC: 4.8 10*3/uL (ref 4.0–10.5)

## 2010-11-17 LAB — COMPREHENSIVE METABOLIC PANEL
AST: 36 U/L (ref 0–37)
Albumin: 3.9 g/dL (ref 3.5–5.2)
Alkaline Phosphatase: 86 U/L (ref 39–117)
Chloride: 99 mEq/L (ref 96–112)
Creatinine, Ser: 0.68 mg/dL (ref 0.50–1.35)
Potassium: 4.2 mEq/L (ref 3.5–5.1)
Total Bilirubin: 0.2 mg/dL — ABNORMAL LOW (ref 0.3–1.2)
Total Protein: 7.2 g/dL (ref 6.0–8.3)

## 2010-11-17 LAB — DIFFERENTIAL
Basophils Relative: 1 % (ref 0–1)
Eosinophils Absolute: 0.2 10*3/uL (ref 0.0–0.7)
Neutrophils Relative %: 40 % — ABNORMAL LOW (ref 43–77)

## 2010-11-17 LAB — RAPID URINE DRUG SCREEN, HOSP PERFORMED
Amphetamines: NOT DETECTED
Barbiturates: NOT DETECTED
Benzodiazepines: NOT DETECTED
Tetrahydrocannabinol: NOT DETECTED

## 2010-11-17 LAB — ETHANOL: Alcohol, Ethyl (B): 242 mg/dL — ABNORMAL HIGH (ref 0–11)

## 2010-11-21 ENCOUNTER — Emergency Department (HOSPITAL_COMMUNITY)
Admission: EM | Admit: 2010-11-21 | Discharge: 2010-11-24 | Disposition: A | Discharge status: Other Acute Inpatient Hospital | Payer: Self-pay | Attending: Emergency Medicine | Admitting: Emergency Medicine

## 2010-11-21 DIAGNOSIS — E119 Type 2 diabetes mellitus without complications: Secondary | ICD-10-CM | POA: Insufficient documentation

## 2010-11-21 DIAGNOSIS — F341 Dysthymic disorder: Secondary | ICD-10-CM | POA: Insufficient documentation

## 2010-11-21 DIAGNOSIS — R45851 Suicidal ideations: Secondary | ICD-10-CM | POA: Insufficient documentation

## 2010-11-21 DIAGNOSIS — F1010 Alcohol abuse, uncomplicated: Secondary | ICD-10-CM | POA: Insufficient documentation

## 2010-11-21 LAB — COMPREHENSIVE METABOLIC PANEL
AST: 27 U/L (ref 0–37)
Albumin: 4.1 g/dL (ref 3.5–5.2)
Alkaline Phosphatase: 74 U/L (ref 39–117)
BUN: 7 mg/dL (ref 6–23)
CO2: 23 mEq/L (ref 19–32)
Calcium: 9.4 mg/dL (ref 8.4–10.5)
Chloride: 99 mEq/L (ref 96–112)
Creatinine, Ser: 0.69 mg/dL (ref 0.50–1.35)
GFR calc Af Amer: >90 mL/min (ref >90)
GFR calc non Af Amer: >90 mL/min (ref >90)
Glucose, Bld: 88 mg/dL (ref 70–99)
Total Bilirubin: 0.3 mg/dL (ref 0.3–1.2)
Total Protein: 7.9 g/dL (ref 6.0–8.3)

## 2010-11-21 LAB — CBC
Hemoglobin: 15.8 g/dL (ref 13.0–17.0)
MCH: 32.3 pg (ref 26.0–34.0)
MCHC: 34.6 g/dL (ref 30.0–36.0)
MCV: 93.5 fL (ref 78.0–100.0)
RBC: 4.89 MIL/uL (ref 4.22–5.81)

## 2010-11-21 LAB — DIFFERENTIAL
Basophils Relative: 1 % (ref 0–1)
Eosinophils Relative: 2 % (ref 0–5)
Lymphs Abs: 2.1 10*3/uL (ref 0.7–4.0)
Monocytes Absolute: 0.5 10*3/uL (ref 0.1–1.0)
Monocytes Relative: 10 % (ref 3–12)
Neutro Abs: 2.2 10*3/uL (ref 1.7–7.7)
Neutrophils Relative %: 44 % (ref 43–77)

## 2010-11-21 LAB — ETHANOL: Alcohol, Ethyl (B): 236 mg/dL — ABNORMAL HIGH (ref 0–11)

## 2010-11-21 LAB — RAPID URINE DRUG SCREEN, HOSP PERFORMED
Barbiturates: NOT DETECTED
Benzodiazepines: NOT DETECTED
Cocaine: NOT DETECTED
Opiates: NOT DETECTED

## 2010-12-31 ENCOUNTER — Encounter (HOSPITAL_COMMUNITY): Payer: Self-pay | Admitting: *Deleted

## 2010-12-31 ENCOUNTER — Emergency Department (HOSPITAL_COMMUNITY)
Admission: EM | Admit: 2010-12-31 | Discharge: 2011-01-01 | Disposition: A | Discharge status: Other Acute Inpatient Hospital | Payer: Self-pay | Attending: Emergency Medicine | Admitting: Emergency Medicine

## 2010-12-31 DIAGNOSIS — F17200 Nicotine dependence, unspecified, uncomplicated: Secondary | ICD-10-CM | POA: Insufficient documentation

## 2010-12-31 DIAGNOSIS — R4589 Other symptoms and signs involving emotional state: Secondary | ICD-10-CM

## 2010-12-31 DIAGNOSIS — R10816 Epigastric abdominal tenderness: Secondary | ICD-10-CM | POA: Insufficient documentation

## 2010-12-31 DIAGNOSIS — F1010 Alcohol abuse, uncomplicated: Secondary | ICD-10-CM

## 2010-12-31 DIAGNOSIS — I10 Essential (primary) hypertension: Secondary | ICD-10-CM | POA: Insufficient documentation

## 2010-12-31 DIAGNOSIS — R109 Unspecified abdominal pain: Secondary | ICD-10-CM | POA: Insufficient documentation

## 2010-12-31 DIAGNOSIS — F489 Nonpsychotic mental disorder, unspecified: Secondary | ICD-10-CM | POA: Insufficient documentation

## 2010-12-31 HISTORY — DX: Alcohol dependence, uncomplicated: F10.20

## 2010-12-31 HISTORY — DX: Reserved for inherently not codable concepts without codable children: IMO0001

## 2010-12-31 LAB — CBC
HCT: 41.4 % (ref 39.0–52.0)
Hemoglobin: 14.7 g/dL (ref 13.0–17.0)
MCHC: 35.5 g/dL (ref 30.0–36.0)
MCV: 92 fL (ref 78.0–100.0)
RDW: 12.7 % (ref 11.5–15.5)

## 2010-12-31 LAB — HEPATIC FUNCTION PANEL
Alkaline Phosphatase: 69 U/L (ref 39–117)
Indirect Bilirubin: 0.4 mg/dL (ref 0.3–0.9)
Total Bilirubin: 0.5 mg/dL (ref 0.3–1.2)

## 2010-12-31 LAB — BASIC METABOLIC PANEL
BUN: 5 mg/dL — ABNORMAL LOW (ref 6–23)
CO2: 25 mEq/L (ref 19–32)
Calcium: 9.2 mg/dL (ref 8.4–10.5)
Chloride: 101 mEq/L (ref 96–112)
Creatinine, Ser: 0.75 mg/dL (ref 0.50–1.35)
GFR calc Af Amer: >90 mL/min (ref >90)

## 2010-12-31 LAB — RAPID URINE DRUG SCREEN, HOSP PERFORMED
Amphetamines: NOT DETECTED
Barbiturates: NOT DETECTED
Benzodiazepines: NOT DETECTED

## 2010-12-31 LAB — DIFFERENTIAL
Basophils Absolute: 0 10*3/uL (ref 0.0–0.1)
Basophils Relative: 1 % (ref 0–1)
Eosinophils Relative: 2 % (ref 0–5)
Monocytes Absolute: 0.5 10*3/uL (ref 0.1–1.0)
Monocytes Relative: 10 % (ref 3–12)
Neutro Abs: 2 10*3/uL (ref 1.7–7.7)

## 2010-12-31 LAB — SALICYLATE LEVEL: Salicylate Lvl: <2 mg/dL — ABNORMAL LOW (ref 2.8–20.0)

## 2010-12-31 LAB — ACETAMINOPHEN LEVEL: Acetaminophen (Tylenol), Serum: <15 ug/mL (ref 10–30)

## 2010-12-31 MED ORDER — LORAZEPAM 2 MG/ML IJ SOLN
1.0000 mg | Freq: Once | INTRAMUSCULAR | Status: AC
  Administered 2010-12-31: 2 mg via INTRAVENOUS
  Filled 2010-12-31: qty 1

## 2010-12-31 MED ORDER — IBUPROFEN 600 MG PO TABS: 600.0000 mg | ORAL_TABLET | Freq: Three times a day (TID) | ORAL | Status: DC | PRN

## 2010-12-31 MED ORDER — LORAZEPAM 1 MG PO TABS
0.0000 mg | ORAL_TABLET | Freq: Four times a day (QID) | ORAL | Status: DC
  Administered 2010-12-31 – 2011-01-01 (×2): 1 mg via ORAL
  Filled 2010-12-31: qty 1

## 2010-12-31 MED ORDER — LORAZEPAM 1 MG PO TABS: 1.0000 mg | ORAL_TABLET | Freq: Three times a day (TID) | ORAL | Status: DC | PRN

## 2010-12-31 MED ORDER — FOLIC ACID 1 MG PO TABS
1.0000 mg | ORAL_TABLET | Freq: Every day | ORAL | Status: DC
  Administered 2010-12-31 – 2011-01-01 (×2): 1 mg via ORAL
  Filled 2010-12-31 (×2): qty 1

## 2010-12-31 MED ORDER — ONDANSETRON HCL 4 MG PO TABS: 4.0000 mg | ORAL_TABLET | Freq: Three times a day (TID) | ORAL | Status: DC | PRN

## 2010-12-31 MED ORDER — ONDANSETRON HCL 4 MG/2ML IJ SOLN
4.0000 mg | Freq: Once | INTRAMUSCULAR | Status: AC
  Administered 2010-12-31: 4 mg via INTRAVENOUS
  Filled 2010-12-31: qty 2

## 2010-12-31 MED ORDER — ACETAMINOPHEN 325 MG PO TABS: 650.0000 mg | ORAL_TABLET | ORAL | Status: DC | PRN

## 2010-12-31 MED ORDER — CITALOPRAM HYDROBROMIDE 20 MG PO TABS
20.0000 mg | ORAL_TABLET | Freq: Every day | ORAL | Status: DC
  Administered 2010-12-31 – 2011-01-01 (×2): 20 mg via ORAL
  Filled 2010-12-31 (×3): qty 1

## 2010-12-31 MED ORDER — LORAZEPAM 1 MG PO TABS
1.0000 mg | ORAL_TABLET | Freq: Four times a day (QID) | ORAL | Status: DC | PRN
  Filled 2010-12-31: qty 1

## 2010-12-31 MED ORDER — LORAZEPAM 1 MG PO TABS: 0.0000 mg | ORAL_TABLET | Freq: Two times a day (BID) | ORAL | Status: DC

## 2010-12-31 MED ORDER — SODIUM CHLORIDE 0.9 % IV SOLN: Freq: Once | INTRAVENOUS | Status: DC

## 2010-12-31 MED ORDER — ZOLPIDEM TARTRATE 5 MG PO TABS: 5.0000 mg | ORAL_TABLET | Freq: Every evening | ORAL | Status: DC | PRN

## 2010-12-31 MED ORDER — ALUM & MAG HYDROXIDE-SIMETH 200-200-20 MG/5ML PO SUSP: 30.0000 mL | ORAL | Status: DC | PRN

## 2010-12-31 MED ORDER — THIAMINE HCL 100 MG/ML IJ SOLN: 100.0000 mg | Freq: Every day | INTRAMUSCULAR | Status: DC

## 2010-12-31 MED ORDER — VITAMIN B-1 100 MG PO TABS
100.0000 mg | ORAL_TABLET | Freq: Every day | ORAL | Status: DC
  Administered 2010-12-31 – 2011-01-01 (×2): 100 mg via ORAL
  Filled 2010-12-31 (×2): qty 1

## 2010-12-31 MED ORDER — THERA M PLUS PO TABS
1.0000 | ORAL_TABLET | Freq: Every day | ORAL | Status: DC
  Administered 2010-12-31: 1 via ORAL
  Administered 2011-01-01: 09:00:00 via ORAL
  Filled 2010-12-31 (×2): qty 1

## 2010-12-31 MED ORDER — LORAZEPAM 2 MG/ML IJ SOLN: 1.0000 mg | Freq: Four times a day (QID) | INTRAMUSCULAR | Status: DC | PRN

## 2010-12-31 NOTE — ED Provider Notes (Signed)
History     CSN: [id number] Arrival date & time: 12/31/2010  6:24 PM   First MD Initiated Contact with Patient 12/31/10 1947      Chief Complaint  Patient presents with  . Medical Clearance    Suicidal    (Consider location/radiation/quality/duration/timing/severity/associated sxs/prior treatment) HPI Patient is a 45 yo M with a history of alcoholism he normally drinks 12 40 ounce beers a day. He presents today after drinking that much and feeling suicidal since his fianc broke up with him. He has been on a 9 day drinking binge. Prior to that he had been sober for 3 weeks. Patient has history of depression and anxiety and now says he wants to kill himself. He is not taking any other substances. He does have a history of pancreatitis and complains of some mild abdominal pain today. There are no other cysts or modifying factors. Past Medical History  Diagnosis Date  . Hypertension   . Alcoholism /alcohol abuse     Past Surgical History  Procedure Date  . Hernia repair     History reviewed. No pertinent family history.  History  Substance Use Topics  . Smoking status: Current Everyday Smoker -- 3.0 packs/day    Types: Cigarettes  . Smokeless tobacco: Not on file  . Alcohol Use: Yes     Drinks every day, 9-11 40 oz beers.      Review of Systems  Constitutional: Negative.   HENT: Negative.   Eyes: Negative.   Respiratory: Negative.   Cardiovascular: Negative.   Gastrointestinal: Positive for abdominal pain.  Genitourinary: Negative.   Musculoskeletal: Negative.   Skin: Negative.   Neurological: Negative.   Hematological: Negative.   Psychiatric/Behavioral: Positive for suicidal ideas.  All other systems reviewed and are negative.    Allergies  Review of patient's allergies indicates no known allergies.  Home Medications   Current Outpatient Rx  Name Route Sig Dispense Refill  . CITALOPRAM HYDROBROMIDE 20 MG PO TABS Oral Take 20 mg by mouth daily.      Marland Kitchen  DIPHENHYDRAMINE HCL 25 MG PO CAPS Oral Take 25 mg by mouth at bedtime as needed. For sleep.       BP 97/54  Pulse 86  Temp(Src) 97.9 F (36.6 C) (Oral)  Resp 18  SpO2 97%  Physical Exam  Nursing note and vitals reviewed. Constitutional: He is oriented to person, place, and time. He appears well-developed and well-nourished. No distress.  HENT:  Head: Normocephalic and atraumatic.  Eyes: Conjunctivae and EOM are normal. Pupils are equal, round, and reactive to light.  Neck: Normal range of motion.  Cardiovascular: Normal rate, regular rhythm, normal heart sounds and intact distal pulses.  Exam reveals no gallop and no friction rub.   No murmur heard. Pulmonary/Chest: Effort normal and breath sounds normal. No respiratory distress. He has no wheezes. He has no rales.  Abdominal: Soft. Bowel sounds are normal. He exhibits no distension. There is tenderness. There is no rebound and no guarding.       Epigastric tenderness  Musculoskeletal: Normal range of motion. He exhibits no edema and no tenderness.  Neurological: He is alert and oriented to person, place, and time. No cranial nerve deficit. He exhibits normal muscle tone. Coordination normal.  Skin: Skin is warm and dry. No rash noted.  Psychiatric: He has a normal mood and affect.    ED Course  Procedures (including critical care time)  Labs Reviewed  DIFFERENTIAL - Abnormal; Notable for the following:  Neutrophils Relative 42 (*)    All other components within normal limits  BASIC METABOLIC PANEL - Abnormal; Notable for the following:    BUN 5 (*)    All other components within normal limits  ETHANOL - Abnormal; Notable for the following:    Alcohol, Ethyl (B) 199 (*)    All other components within normal limits  SALICYLATE LEVEL - Abnormal; Notable for the following:    Salicylate Lvl <2.0 (*)    All other components within normal limits  CBC  URINE RAPID DRUG SCREEN (HOSP PERFORMED)  ACETAMINOPHEN LEVEL  LIPASE,  BLOOD  HEPATIC FUNCTION PANEL   No results found.   1. Suicidal behavior   2. Alcohol abuse       MDM    Patient was evaluated and laboratory workup reviewed. This was remarkable only for an alcohol level of 119. Liver panel and lipase were within normal limits. Patient's initial blood pressure was recorded to be 81/37. However all subsequent blood pressures were within normal limits. Patient did receive a liter of normal saline IV bolus. She was feeling anxious and he received some Ativan and also some Zofran for some mild nausea. He was transferred to the psychiatric ED once medically cleared. [facility] is and consulted and is seeking placement. Holding orders were placed in addition to CIWA protocol.       Cyndra Numbers, MD 01/01/11 0002

## 2010-12-31 NOTE — ED Notes (Signed)
Pt states he is suicidal at 3 pm today when his fiancee' broke up with him. Pt states he had 12, 40 oz beers today. Pt c/o abdominal pain after 1300 today. Pt c/o n/v x 9 today.

## 2010-12-31 NOTE — ED Notes (Signed)
ZOX:[id number]> Expected date:12/31/10<BR> Expected time: 5:59 PM<BR> Means of arrival:Ambulance<BR> Comments:<BR> EMS 10 GC, etoh SI
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
## 2010-12-31 NOTE — ED Notes (Signed)
3 bags of belongings, clothes and shoes, locked in cabinet in room 20 by Simon Rhein, EMT

## 2011-01-01 ENCOUNTER — Inpatient Hospital Stay (HOSPITAL_COMMUNITY)
Admission: AD | Admit: 2011-01-01 | Discharge: 2011-01-08 | DRG: 897 | Disposition: A | Discharge status: Home / Self Care | Payer: No Typology Code available for payment source | Source: Ambulatory Visit | Attending: Psychiatry | Admitting: Psychiatry

## 2011-01-01 DIAGNOSIS — F3289 Other specified depressive episodes: Secondary | ICD-10-CM

## 2011-01-01 DIAGNOSIS — Z6379 Other stressful life events affecting family and household: Secondary | ICD-10-CM

## 2011-01-01 DIAGNOSIS — IMO0001 Reserved for inherently not codable concepts without codable children: Secondary | ICD-10-CM

## 2011-01-01 DIAGNOSIS — F17200 Nicotine dependence, unspecified, uncomplicated: Secondary | ICD-10-CM

## 2011-01-01 DIAGNOSIS — K089 Disorder of teeth and supporting structures, unspecified: Secondary | ICD-10-CM

## 2011-01-01 DIAGNOSIS — I10 Essential (primary) hypertension: Secondary | ICD-10-CM

## 2011-01-01 DIAGNOSIS — R45851 Suicidal ideations: Secondary | ICD-10-CM

## 2011-01-01 DIAGNOSIS — Z79899 Other long term (current) drug therapy: Secondary | ICD-10-CM

## 2011-01-01 DIAGNOSIS — F329 Major depressive disorder, single episode, unspecified: Secondary | ICD-10-CM

## 2011-01-01 DIAGNOSIS — F1020 Alcohol dependence, uncomplicated: Principal | ICD-10-CM

## 2011-01-01 MED ORDER — THERA M PLUS PO TABS
1.0000 | ORAL_TABLET | Freq: Every day | ORAL | Status: DC
  Administered 2011-01-01 – 2011-01-03 (×3): 1 via ORAL
  Filled 2011-01-01 (×3): qty 1

## 2011-01-01 MED ORDER — ONDANSETRON 4 MG PO TBDP: 4.0000 mg | ORAL_TABLET | Freq: Four times a day (QID) | ORAL | Status: DC | PRN

## 2011-01-01 MED ORDER — CITALOPRAM HYDROBROMIDE 20 MG PO TABS
20.0000 mg | ORAL_TABLET | Freq: Every day | ORAL | Status: DC
  Administered 2011-01-02 – 2011-01-06 (×5): 20 mg via ORAL
  Filled 2011-01-01 (×7): qty 1

## 2011-01-01 MED ORDER — HYDROXYZINE PAMOATE 25 MG PO CAPS: 25.0000 mg | ORAL_CAPSULE | Freq: Four times a day (QID) | ORAL | Status: DC | PRN

## 2011-01-01 MED ORDER — ALUM & MAG HYDROXIDE-SIMETH 200-200-20 MG/5ML PO SUSP: 30.0000 mL | ORAL | Status: DC | PRN

## 2011-01-01 MED ORDER — CHLORDIAZEPOXIDE HCL 25 MG PO CAPS
25.0000 mg | ORAL_CAPSULE | Freq: Four times a day (QID) | ORAL | Status: AC | PRN
  Administered 2011-01-01 – 2011-01-04 (×5): 25 mg via ORAL
  Filled 2011-01-01 (×6): qty 1

## 2011-01-01 MED ORDER — VITAMIN B-1 100 MG PO TABS
100.0000 mg | ORAL_TABLET | Freq: Every day | ORAL | Status: DC
  Administered 2011-01-02 – 2011-01-03 (×2): 100 mg via ORAL
  Filled 2011-01-01 (×4): qty 1

## 2011-01-01 MED ORDER — MAGNESIUM HYDROXIDE 400 MG/5ML PO SUSP: 30.0000 mL | Freq: Every day | ORAL | Status: DC | PRN

## 2011-01-01 MED ORDER — THIAMINE HCL 100 MG/ML IJ SOLN
100.0000 mg | Freq: Once | INTRAMUSCULAR | Status: AC
  Administered 2011-01-01: 200 mg via INTRAMUSCULAR

## 2011-01-01 MED ORDER — ACETAMINOPHEN 325 MG PO TABS
650.0000 mg | ORAL_TABLET | Freq: Four times a day (QID) | ORAL | Status: DC | PRN
  Administered 2011-01-04 – 2011-01-06 (×5): 650 mg via ORAL
  Administered 2011-01-06: 325 mg via ORAL
  Administered 2011-01-07: 650 mg via ORAL
  Administered 2011-01-07: 325 mg via ORAL
  Administered 2011-01-08: 650 mg via ORAL

## 2011-01-01 MED ORDER — LOPERAMIDE HCL 2 MG PO CAPS: 2.0000 mg | ORAL_CAPSULE | ORAL | Status: DC | PRN

## 2011-01-01 NOTE — ED Notes (Signed)
Pt transferred to [facility] via security. Instructions reviewed and patient verbalized understanding.

## 2011-01-01 NOTE — Progress Notes (Signed)
Patient ID: [name], male   DOB: Apr 06, 1965, 45 y.o.   MRN: [id number] Nursing admit note:  Pt is a 45 y/o w/m admitted following medical clearance from [facility]. Pt is requesting detox from ETOH and subsequent long term SA treatment.  Multiple inpatient admissions but is not forthcoming with locations except that he was here approximately 3 years ago.  Presents irritable and disheveled in appearance. States drinks 9-11 40oz beers a day with last use on 12/31/10. ED reports that GF and ex-partner dismissed him from their residences from which pt presented to them.  Pt also reports hx of HTN and "pancreatistis" but could not recall specific dates.  Fluids and snack given following orientation to the unit. VS taken by MHT and support offered. 15' checks initiated for safety. Westmont Cellar RN
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
## 2011-01-01 NOTE — Progress Notes (Signed)
Pt in bed for much of shift, when awakened he complained of sweats.  Pt states "my whole back is covered in sweat".  Explained detox to PT, gave Pt ordered medication for withdrawal.  See doc flowsheet for CIWA.  Support and encouragement offered, will continue to monitor.

## 2011-01-01 NOTE — Progress Notes (Signed)
[facility] Group Notes:  (Counselor/Nursing/MHT/Case Management/Adjunct)  01/01/2011 1315PM   Type of Therapy:  Psychoeducational Skills  Participation Level:  Active  Participation Quality:  Appropriate  Affect:  Appropriate  Cognitive:  Appropriate  Insight:  Good  Engagement in Group:  Good  Engagement in Therapy:  Good  Modes of Intervention:  Clarification, Problem-solving and Support  Summary of Progress/Problems: Pt. stated that he would choose the color black to describe his feelings of being stuck in a black hole. Pt. also chose the color red to describe his anger related to his alcohol abuse. Pt. expressed that his alcohol abuse affects his relationships. Pt. 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    Crosswell, Desiree 01/01/2011, 4:05 PM

## 2011-01-01 NOTE — ED Provider Notes (Signed)
Patient reevaluated this morning. Vision is resting comfortably and voices no current concerns. He has no medical complaints at this time.  Heart is regular rate and rhythm, no murmurs, rubs, gallops. Lungs are clear auscultation bilaterally. Abdomen soft, nontender.  Patient remains medically stable at this time. He is awaiting psychiatric disposition.  Dayton Bailiff, MD 01/01/11 [phone]

## 2011-01-02 DIAGNOSIS — F1020 Alcohol dependence, uncomplicated: Principal | ICD-10-CM

## 2011-01-02 MED ORDER — DIPHENHYDRAMINE HCL 25 MG PO CAPS: 50.0000 mg | ORAL_CAPSULE | Freq: Every evening | ORAL | Status: DC | PRN

## 2011-01-02 MED ORDER — NICOTINE 21 MG/24HR TD PT24
21.0000 mg | MEDICATED_PATCH | Freq: Every day | TRANSDERMAL | Status: DC
  Filled 2011-01-02 (×3): qty 1

## 2011-01-02 NOTE — Progress Notes (Signed)
[facility] Group Notes:  (Counselor/Nursing/MHT/Case Management/Adjunct)  01/02/2011 2:53 PM  Type of Therapy:  group therapy  Participation Level:  Did Not Attend   Summary of Progress/Problems:   Purcell Nails 01/02/2011, 2:53 PM

## 2011-01-02 NOTE — Progress Notes (Signed)
[facility] Group Notes:  (Counselor/Nursing/MHT/Case Management/Adjunct)  01/02/2011 10:22 PM  Type of Therapy:  Psychoeducational Skills  Participation Level:  Minimal  Participation Quality:  Attentive  Affect:  Appropriate and Blunted  Cognitive:  Appropriate and Oriented  Insight:  Good  Engagement in Group:  Limited  Engagement in Therapy:  n/a  Modes of Intervention:  Activity, Education, Problem-solving, Socialization and Support  Summary of Progress/Problems: Peregrine was quiet but participated in group activity labeling themselves and their peers. Willman was active in discussion defining what a label is, what the effects of labeling people are, and listing positive and negative labels they have been called or callled themselves in the past. Tamotsu was asked to complete a worksheet listing labels they have had and the reality of the label.    Wandra Scot 01/02/2011, 10:22 PM

## 2011-01-02 NOTE — Progress Notes (Signed)
Pt has been appropriate this shift. Is out in milieu attending groups and interacting with peers. Cont to c/o tremors, cravings and agitation r/t withdrawal but has requested no prn medication on this shift. Encouraged fluids.  Rates depression at 8 and hopelessness at 7 and desires long term SA treatment.  Denies SI. Support offered and 15' checks cont for safety.

## 2011-01-02 NOTE — Progress Notes (Signed)
Suicide Risk Assessment  Admission Assessment     Demographic factors:  Assessment Details Time of Assessment: Admission Information Obtained From: Patient Current Mental Status:  Current Mental Status: Suicidal ideation indicated by patient Loss Factors:  Loss Factors: Loss of significant relationship Historical Factors:  Historical Factors: Family history of mental illness or substance abuse Risk Reduction Factors:  Risk Reduction Factors: Living with another person, especially a relative  CLINICAL FACTORS:   Alcohol/Substance Abuse/Dependencies  COGNITIVE FEATURES THAT CONTRIBUTE TO RISK:  Polarized thinking    SUICIDE RISK:   Mild:  Suicidal ideation of limited frequency, intensity, duration, and specificity.  There are no identifiable plans, no associated intent, mild dysphoria and related symptoms, good self-control (both objective and subjective assessment), few other risk factors, and identifiable protective factors, including available and accessible social support.  PLAN OF CARE: I saw the patient and reviewed the medical records. Patient has a long history of  alcohol abuse with multiple admissions. When I started discussing with him about the multiple admissions patient became angry and irritable. He reported that he is frustrated depressed and have suicidal ideations without a specific plan but he is logical and goal-directed no psychotic or manic symptoms present. Alert awake oriented x3 insight and judgment is poor. Psychoeducation given to the patient and discussed with him about what coping skills he has learned up to now and how he is acting on them. I will continue with current treatment plan.   Reisha Wos S 01/02/2011, 10:32 AM

## 2011-01-02 NOTE — Progress Notes (Signed)
Pt reports withdrawal, main complaint heavy sweating.  Pt pleasant on approach though and states that he is "working through it".  Pt refusing nicotine patch even though he reports that he is a heavy smoker stating "It doesn't really bother me unless I am outside".  Denies SI/HI/hallucinations.  See CIWA flowsheet.  Support and encouragement offered, will continue to monitor.

## 2011-01-02 NOTE — Progress Notes (Signed)
Patient ID: [name], male   DOB: 1965/07/14, 45 y.o.   MRN: [id number]  Pt. attended and participated in aftercare planning group. Pt. accepted information on suicide prevention, warning signs to look for with suicide and crisis line numbers to use. The pt. agreed to call crisis line numbers if having warning signs or having thoughts of suicide. Pt. Took handout for [name] schedules in [facility]. Pt. Stated that he did slept well last night because of his negative thoughts.   [name] L. [employer]

## 2011-01-03 DIAGNOSIS — F10239 Alcohol dependence with withdrawal, unspecified: Secondary | ICD-10-CM

## 2011-01-03 MED ORDER — CHLORDIAZEPOXIDE HCL 25 MG PO CAPS
25.0000 mg | ORAL_CAPSULE | Freq: Four times a day (QID) | ORAL | Status: AC
  Administered 2011-01-03 – 2011-01-04 (×6): 25 mg via ORAL
  Filled 2011-01-03 (×5): qty 1

## 2011-01-03 MED ORDER — CHLORDIAZEPOXIDE HCL 25 MG PO CAPS
25.0000 mg | ORAL_CAPSULE | Freq: Every day | ORAL | Status: AC
  Administered 2011-01-07: 25 mg via ORAL
  Filled 2011-01-03: qty 1

## 2011-01-03 MED ORDER — ONDANSETRON 4 MG PO TBDP
4.0000 mg | ORAL_TABLET | Freq: Four times a day (QID) | ORAL | Status: AC | PRN
  Administered 2011-01-04: 4 mg via ORAL
  Filled 2011-01-03: qty 1

## 2011-01-03 MED ORDER — LOPERAMIDE HCL 2 MG PO CAPS: 2.0000 mg | ORAL_CAPSULE | ORAL | Status: AC | PRN

## 2011-01-03 MED ORDER — CHLORDIAZEPOXIDE HCL 25 MG PO CAPS
25.0000 mg | ORAL_CAPSULE | ORAL | Status: AC
  Administered 2011-01-06 (×2): 25 mg via ORAL
  Filled 2011-01-03 (×2): qty 1

## 2011-01-03 MED ORDER — THERA M PLUS PO TABS
1.0000 | ORAL_TABLET | Freq: Every day | ORAL | Status: DC
  Administered 2011-01-03 – 2011-01-08 (×6): 1 via ORAL
  Filled 2011-01-03 (×6): qty 1

## 2011-01-03 MED ORDER — VITAMIN B-1 100 MG PO TABS
100.0000 mg | ORAL_TABLET | Freq: Every day | ORAL | Status: DC
  Administered 2011-01-04 – 2011-01-08 (×5): 100 mg via ORAL
  Filled 2011-01-03 (×7): qty 1

## 2011-01-03 MED ORDER — CHLORDIAZEPOXIDE HCL 25 MG PO CAPS
25.0000 mg | ORAL_CAPSULE | Freq: Three times a day (TID) | ORAL | Status: AC
  Administered 2011-01-05 (×3): 25 mg via ORAL
  Filled 2011-01-03 (×3): qty 1

## 2011-01-03 NOTE — Progress Notes (Signed)
Pt attended AM group, good participation.  Relates in a loud, emphatic voice that could easily be interpreted as angry.  And, in fact, pt states he has an anger control issue.  But he came across more as intensely wanting me to understand his situation and needs, and I gave him that feedback.  Admits to drinking and being an alcoholic.  Feels shame for having again relapsed, and appears to be castigating self for such.  Emphatically stated he did not want to go to [facility] as it is "worse than prison".  Not sure where he will go at d/c.  States he plans to talk to employer about staying there temporarily-he has worked for the same [employer] for the past 15 years.  Asked him about a halfway house.  Says he had a good experience at on 10 years ago in [place], [place], but reluctant to check into [facility] as "I have heard bad things."

## 2011-01-03 NOTE — Progress Notes (Signed)
Patient ID: [name], male   DOB: 1965-09-09, 45 y.o.   MRN: [id number] Nursing Pt.is medication compliant and attending Groups.He rates his Depression and Hopelessness as 7/10.He gets angry with himself because he hates what he is doing with his life.His plan is to go to [employer] and get a Marketing executive.States he has a wife and child to take care of and he needs to do so. Encouraged and supported.

## 2011-01-03 NOTE — Progress Notes (Signed)
[facility] Group Notes:  (Counselor/Nursing/MHT/Case Management/Adjunct)  01/03/2011 5:09 PM  Type of Therapy:  Group Therapy at 11:00am  Participation Level:  Minimal  Participation Quality:  Minimal  Affect:  Depressed  Cognitive:  Oriented  Insight:  None shared  Engagement in Group:  None  Engagement in Therapy:  None  Modes of Intervention:  Education, Socialization and Support  Summary of Progress/Problems: Agricultural consultant from [facility] came in to share her story and provide information regarding peer led services at [facility] and group support meetings. Patient shared "72 year old daughter and ex"when asked re something that he is grateful for.   [name] 01/03/2011, 5:09 PM  [facility] Group Notes:  (Counselor/Nursing/MHT/Case Management/Adjunct)  01/03/2011 5:09 PM  Type of Therapy:  Group at 1:15pm  Participation Level:  Minimal  Participation Quality:  Inattentive  Affect:  Depressed  Cognitive:  Oriented  Insight:  None shared  Engagement in Group:  None  Engagement in Therapy:  None  Modes of Intervention:  Education, Socialization and Support  Summary of Progress/Problems.  Volunteer from [facility] came in to share her story and provide information regarding peer led services at [facility] and group support meetings.   [name] 01/03/2011, 5:09 PM

## 2011-01-03 NOTE — Progress Notes (Signed)
Interdisciplinary Treatment Plan Update (Adult)  Date: 01/03/2011  Time Reviewed: 8:12 AM   Progress in Treatment: Attending groups: Yes Participating in groups: Yes Taking medication as prescribed: Yes Tolerating medication: Yes   Family/Significant othe contact made: None identified  Patient understands diagnosis:  Yes as evidenced by admitting that he is a relapsed alcoholic  Discussing patient identified problems/goals with staff:  Yes as evidenced by talking about plan for sobriety Medical problems stabilized or resolved:  Yes Denies suicidal/homicidal ideation: Yes as evidenced on self inventory Issues/concerns per patient self-inventory:  No Other:  New problem(s) identified: N/A  Reason for Continuation of Hospitalization: Medication stabilization Withdrawal symptoms  Interventions implemented related to continuation of hospitalization: Librium detox protocol  Monitor withdrawal symptoms Additional comments:  Estimated length of stay:  Likely d/c Fri Discharge Plan:Pt plans to live with boss, attend daily AA mtgs.  New goal(s): N/A  Review of initial/current patient goals per problem list:   1.  Goal(s):Identify safe living situation post d/c  Met:  Yes  Target date:  As evidenced [id number] by Jonny Ruiz of place to stay  2.  Goal (s):Safely detox from alcohol  Met:  No  Target date:12/5  As evidenced [id number] in CIWA score from current to 0  3.  Goal(s):  Met:  No  Target date:  As evidenced by:  4.  Goal(s):  Met:  No  Target date:  As evidenced by:  Attendees: Patient:     Family:     Physician:  [name] 01/03/2011 8:12 AM   Nursing:   Manuela Schwartz 01/03/2011 8:12 AM   Case Manager:  Richelle Ito, LCSW 01/03/2011 8:12 AM   Counselor:  Ronda Fairly, LCSWA 01/03/2011 8:12 AM   Other:     Other:     Other:     Other:      Scribe for Treatment Team:   Ida Rogue, 01/03/2011 8:12 AM

## 2011-01-03 NOTE — Progress Notes (Signed)
[facility] MD Progress Note  01/03/2011 4:24 PM  Subjective: Patient seen and evaluated, chart reviewed. Patient admitted after being seen in the emergency department with complaint of suicidal ideation and requesting detox from alcohol. At this time, he endorses significant withdrawal symptoms including shakes, sweats and increased heart rate. He also has a history of hypertension, but is currently not on medication. He averages 9 to 11 (40) ounce beers a day.  He was also taking Celexa 20 mg by mouth daily and Benadryl for sleep. At this time, he endorsed significant withdrawal signs & symptoms. He will be placed a Librium protocol for detox.  Meds:    . citalopram  20 mg Oral Daily  . multivitamins ther. w/minerals  1 tablet Oral Daily  . thiamine  100 mg Oral Daily  . DISCONTD: nicotine  21 mg Transdermal Q0600   Sleep:  Number of Hours: 6   Objective: Pt stated that his mood was "not good". His affect was mood congruent and slightly anxious. He is having significant withdrawal signs & symptoms. He had good eye contact. His speech is normal rate, tone and volume. He denied any current thoughts of self injurious behavior, suicidal ideation or homicidal ideation. He denied any current auditory or visual hallucinations, paranoia or delusional thought processes. His thought process was linear and goal-directed. His insight was limited and his judgment is fair.  He contracted for safety on the unit.  Vital Signs:Blood pressure 118/71, pulse 65, temperature 98.2 F (36.8 C), temperature source Oral, resp. rate 20, height 5\' 6"  (1.676 m), weight 69.854 kg (154 lb).  Lab Results: No results found for this or any previous visit (from the past 48 hour(s)).  CIWA:  CIWA-Ar Total: 5   A/Plan: Patient in significant alcohol withdrawal. I initiated the standard Librium taper. He was already  restarted on his citalopram. He denied any side effects. We will continue every 15 minute checks per protocol. Patient  agreeable to the plan. Medication education administered.  See orders.    [name] 01/03/2011, 4:24 PM

## 2011-01-04 MED ORDER — TRAZODONE HCL 50 MG PO TABS
50.0000 mg | ORAL_TABLET | Freq: Every day | ORAL | Status: DC
  Administered 2011-01-04 – 2011-01-05 (×2): 50 mg via ORAL
  Filled 2011-01-04 (×3): qty 1

## 2011-01-04 NOTE — Progress Notes (Signed)
Patient ID: [name], male   DOB: 1965-06-17, 45 y.o.   MRN: [id number]    Patient pleasant on approach today. Reports depression and hopelessness "6" on scales. Currently denies any SI at present. Reports some withdrawals such as tremors, cravings, diarrhea, and agitation but states that it is minimal. Patient currently on librium protocol. Staff will monitor and encourage group attendance.

## 2011-01-04 NOTE — Progress Notes (Signed)
Recreation Therapy Group Note  Date: 01/04/2011         Time: 1000       Group Topic/Focus: Patient invited to participate in animal assisted therapy. Pets as a coping skill and responsibility were discussed.   Participation Level: Active  Participation Quality: Appropriate and Attentive  Affect: Appropriate  Cognitive: Appropriate and Oriented   Additional Comments: None 

## 2011-01-04 NOTE — Progress Notes (Signed)
Recreation Therapy Group Note  Date: 01/04/2011         Time: 1415      Group Topic/Focus: The focus of this group is on discussing various styles of communication and communicating assertively using 'I' (feeling) statements.  Participation Level: Minimal  Participation Quality: Resistant  Affect: Irritable  Cognitive: Oriented   Additional Comments: Patient stood up half way through group, said "I'm done" and left. Patient offered no other explanation and did not return.   Romi Rathel 01/04/2011 5:43 PM

## 2011-01-04 NOTE — Progress Notes (Signed)
Patient ID: [name], male   DOB: 06/09/1965, 45 y.o.   MRN: [id number] Pt. Denies AVH, SHI and reports that he is doing fine. He has made plans for discharge. "I'm going home and I'll  have somebody watching out for me." "My boss man don't drink".  Pt. Says his friend is going to help him through this. Staff will continue to monitor q49min for safety.
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
## 2011-01-04 NOTE — Progress Notes (Signed)
Pt attended AM group.  C/O withdrawal symptoms, including night sweats and nausea.  Gratified that his boss is willing to let him come stay there post d/c.  Castigating self for relapse.  Plans to attend daily AA mtgs and get a sponsor.  Identified [facility] as his home group.

## 2011-01-05 DIAGNOSIS — F1010 Alcohol abuse, uncomplicated: Secondary | ICD-10-CM

## 2011-01-05 NOTE — Progress Notes (Signed)
Patient ID: [name], male   DOB: 1965/03/02, 45 y.o.   MRN: [id number] Patient has been pleasant and cooperative. Denies SI. Minimal withdrawal sx. Compliant with medications.

## 2011-01-05 NOTE — Progress Notes (Signed)
Patient ID: [name], male   DOB: 1965-06-30, 45 y.o.   MRN: [id number]  Spoke with patient after he attended evening group. Patient states that he has had a good day. Only complaint that patient states is that he has been drowsy all day. Explained to patient that being on librium protocol can cause drowsiness from medication. Medications were brought to patient's bedside this evening and patient took without incident.

## 2011-01-05 NOTE — Progress Notes (Signed)
Pt seen in AM group.  Good participation.  States he is still having withdrawal symptoms of sweats and nausea.  No complaints.  Still upset with self for relapsing.  Pointed out positives of willingness to take responsibility for his behavior and making amends with his boss.  Likely d/c Thurs or Fri.

## 2011-01-05 NOTE — Progress Notes (Signed)
[facility] Adult Inpatient Family/Significant Other Suicide Prevention Education  Suicide Prevention Education:  Patient Refusal for Family/Significant Other Suicide Prevention Education: The patient [name] has refused to provide written consent for family/significant other to be provided Family/Significant Other Suicide Prevention Education during admission and/or prior to discharge.  Physician and treatment team notified. Writer and patient had one on one discussion which included identification of pt's risks factors, warning signs, and resources to contact for help should pt again experience suicidal ideation  [name] 01/05/2011, 10:58 AM

## 2011-01-05 NOTE — Progress Notes (Signed)
[facility] MD Progress Note  01/05/2011   Note late entry for 01/04/11.  Subjective: Patient seen and evaluated, chart reviewed. Patient admitted after being seen in the emergency department with complaint of suicidal ideation and requesting detox from alcohol. At this time, he endorses significant withdrawal symptoms including tremors, sleepiness and nausea.  Requested medication for sleep.    Objective: Pt stated that his mood was "little better". His affect was mood congruent and slightly anxious. He is having significant withdrawal signs & symptoms. He had good eye contact. His speech is normal rate, tone and volume. He denied any current thoughts of self injurious behavior, suicidal ideation or homicidal ideation. He denied any current auditory or visual hallucinations, paranoia or delusional thought processes. His thought process was linear and goal-directed. His insight was limited and his judgment is fair. He contracted for safety on the unit.   A/Plan: Patient in significant alcohol withdrawal. Librium taper in progress. He was already restarted on his citalopram. He denied any side effects. We will continue every 15 minute checks per protocol. Patient agreeable to the plan. Medication education administered. See orders, started trazodone.  Discussed with team.   [name] 01/05/2011, 8:51 PM

## 2011-01-05 NOTE — Progress Notes (Signed)
[name] is a 45 y.o. male [id number] [birth year]   Diagnosis:  Axis I: Alcohol Abuse Active Problems:  Alcoholism /alcohol abuse   Vital Signs:Blood pressure 124/84, pulse 66, temperature 97.4 F (36.3 C), temperature source Oral, resp. rate 18, height 5\' 6"  (1.676 m), weight 154 lb (69.854 kg).  Subjective/Objective: [name] is up and active in the group milieu.  He is much more agreeable today and much less irritated.  He notes that he did have some poor sleep but his appetite is a little better today.  He also reports some weakness in his legs and states he feels a bit wobbly when walking down the halls.  He also notes tremors, vomiting, and diarrhea, numbness and tingling in his extremities, headache with some sensitivity to light. He reports no side effects to the medication and he rates his depression being a 7/10.  He states his SI is down from when he was admitted and rates it a 4/10 with 8/10 upon arrival.  Mental Status:  General Appearance [name]:  Disheveled Eye Contact:  Good Motor Behavior:  Restlestness Speech:  Normal Level of Consciousness:  Alert Mood:  Euthymic Affect:  Appropriate Anxiety Level:  Minimal Thought Process:  Coherent Thought Content:  WNL Perception:  Normal Judgment:  Poor Insight:  Absent Cognition:  Orientation time, place and person Sleep:  Number of Hours: 6.75   Assessment/Plan: Continue the current plan for detox.  No changes at this time. Initial plan for discharge will be 3-5 days. Lab Results:   BMET    Component Value Date/Time   NA 137 12/31/2010 2000   K 3.5 12/31/2010 2000   CL 101 12/31/2010 2000   CO2 25 12/31/2010 2000   GLUCOSE 81 12/31/2010 2000   BUN 5* 12/31/2010 2000   CREATININE 0.75 12/31/2010 2000   CALCIUM 9.2 12/31/2010 2000   GFRNONAA >90 12/31/2010 2000   GFRAA >90 12/31/2010 2000    Medications:  Scheduled:     . chlordiazePOXIDE  25 mg Oral QID   Followed by  . chlordiazePOXIDE  25 mg Oral TID     Followed by  . chlordiazePOXIDE  25 mg Oral BH-qamhs   Followed by  . chlordiazePOXIDE  25 mg Oral Daily  . citalopram  20 mg Oral Daily  . multivitamins ther. w/minerals  1 tablet Oral Daily  . thiamine  100 mg Oral Daily  . traZODone  50 mg Oral QHS     PRN Meds acetaminophen, alum & mag hydroxide-simeth, loperamide, magnesium hydroxide, ondansetron   [name] T. [facility] 01/05/2011

## 2011-01-05 NOTE — Progress Notes (Signed)
[facility] Group Notes:  (Counselor/Nursing/MHT/Case Management/Adjunct)  01/05/2011 3:02 PM  Type of Therapy:  Counseling 11:00am  Participation Level:  Active  Participation Quality:  Appropriate and Sharing  Affect:  Appropriate  Cognitive:  Appropriate  Insight:  Good  Engagement in Group:  Good  Engagement in Therapy:  Good  Modes of Intervention:  Socialization, Support and Exploration  Summary of Progress/Problems:Group members were asked to share two emotions they have experience for majority of the morning today or within the las 24 hours. Discussion focused on emotions group members have difficulty processing. [name] shared that his feelings often keep me stuck; feelings of fear, especially about future outcomes. [name] expressed hesitancy to verbalize possible positive outcomes.   [name] 01/05/2011, 3:02 PM

## 2011-01-05 NOTE — Progress Notes (Signed)
Patient ID: [name], male   DOB: 1966/01/20, 45 y.o.   MRN: [id number]  Patient asleep in bed, resting with eyes closed. Appears in no distress. Respirations even, normal and unlabored. Continue monitoring patient q15 minutes for safety.

## 2011-01-05 NOTE — Progress Notes (Signed)
Patient ID: [name], male   DOB: 1965/07/21, 45 y.o.   MRN: [id number] Nursing   Pt. Is med compliant and attending Groups.He rates his Depression as 8/10 and his Hopelessness as 6/10.He denies S/I,H/I and A/V Hallucinations.His Goal is to take better care of himself,go to AA meetings and to get a sponsor.Encouraged and supported.

## 2011-01-05 NOTE — Progress Notes (Signed)
[facility] Group Notes:  (Counselor/Nursing/MHT/Case Management/Adjunct)  01/05/2011 3:19 PM  Type of Therapy:  1:15 Group  Participation Level:  Did Not Attend   [name] 01/05/2011, 3:19 PM

## 2011-01-06 MED ORDER — IBUPROFEN 800 MG PO TABS
800.0000 mg | ORAL_TABLET | Freq: Once | ORAL | Status: AC
  Administered 2011-01-06: 800 mg via ORAL

## 2011-01-06 MED ORDER — IBUPROFEN 600 MG PO TABS
600.0000 mg | ORAL_TABLET | Freq: Four times a day (QID) | ORAL | Status: AC
  Administered 2011-01-06 – 2011-01-08 (×6): 600 mg via ORAL
  Filled 2011-01-06 (×9): qty 1

## 2011-01-06 MED ORDER — PENICILLIN V POTASSIUM 500 MG PO TABS
500.0000 mg | ORAL_TABLET | Freq: Four times a day (QID) | ORAL | Status: DC
  Administered 2011-01-06 – 2011-01-08 (×7): 500 mg via ORAL
  Filled 2011-01-06: qty 22
  Filled 2011-01-06: qty 1
  Filled 2011-01-06 (×5): qty 22
  Filled 2011-01-06: qty 1
  Filled 2011-01-06: qty 22
  Filled 2011-01-06 (×2): qty 1
  Filled 2011-01-06: qty 22
  Filled 2011-01-06 (×2): qty 1
  Filled 2011-01-06 (×2): qty 22
  Filled 2011-01-06: qty 1
  Filled 2011-01-06 (×2): qty 22

## 2011-01-06 MED ORDER — TRAZODONE HCL 100 MG PO TABS
100.0000 mg | ORAL_TABLET | Freq: Every day | ORAL | Status: DC
  Administered 2011-01-06 – 2011-01-07 (×2): 100 mg via ORAL
  Filled 2011-01-06 (×4): qty 1

## 2011-01-06 MED ORDER — IBUPROFEN 600 MG PO TABS
600.0000 mg | ORAL_TABLET | Freq: Four times a day (QID) | ORAL | Status: DC | PRN
  Filled 2011-01-06 (×2): qty 1

## 2011-01-06 MED ORDER — BENZOCAINE 10 % MT GEL
Freq: Four times a day (QID) | OROMUCOSAL | Status: DC | PRN
  Administered 2011-01-06 – 2011-01-07 (×3): via OROMUCOSAL

## 2011-01-06 MED ORDER — CITALOPRAM HYDROBROMIDE 20 MG PO TABS
30.0000 mg | ORAL_TABLET | Freq: Every day | ORAL | Status: DC
  Administered 2011-01-07 – 2011-01-08 (×2): 30 mg via ORAL
  Filled 2011-01-06 (×3): qty 1

## 2011-01-06 MED ORDER — IBUPROFEN 800 MG PO TABS
ORAL_TABLET | ORAL | Status: AC
  Administered 2011-01-06: 800 mg via ORAL
  Filled 2011-01-06: qty 1

## 2011-01-06 NOTE — Progress Notes (Signed)
Patient ID: [name], male   DOB: [birth year], 45 y.o.   MRN: [id number]  Addendum to Lissa Hoard note dated today:  Patient seen and evaluated. Chart reviewed. Per team, patient has complaint of continued depression with episodic suicidal ideation. He has endorsed a history of difficulty managing anger, as well as, he is bothered by a toothache at this time. We discussed various medication options. He was interested in increasing his antidepressant and trazodone so he could sleep better this evening. We also prescribed ibuprofen for his tooth pain and we'll look into a dental consult. Furthermore, I put him on one-to-one for complaint of depression with suicidal ideation and inability contract for safety. Discussed with the team. Patient agreeable with the plan.  Discharge now pending.  Librium taper still in progress as well.  See orders.

## 2011-01-06 NOTE — Progress Notes (Signed)
Patient ID: [name], male   DOB: [birth year], 45 y.o.   MRN: [id number]  Asked patient about his complaints of tooth ache last night. Patient states that 'whatever that horse-pill was that was given to me, it really helped!" Explained to patient that he will receive scheduled medications this evening, including those for his tooth. This is the most excited I have seen [name] on the unit. Patient was excited about group and about the lack of pain in his tooth. Denies SI/HI/AV. Polite and appropriate. Patient had to be redirected to lower 'inside' voice, patient followed directions.

## 2011-01-06 NOTE — Progress Notes (Signed)
Patient has made multiple complaints tonight regarding tooth pain on right side of mouth. Medicated with tylenol tonight and cold pack with little relief.

## 2011-01-06 NOTE — Progress Notes (Signed)
[name] is a 45 y.o. male [id number] November 05, 1965   Diagnosis:  Axis I: Alcohol Abuse Active Problems:  Alcoholism /alcohol abuse   Subjective/Objective: Vital Signs:Blood pressure 118/81, pulse 62, temperature 97.4 F (36.3 C), temperature source Oral, resp. rate 18, height 5\' 6"  (1.676 m), weight 154 lb (69.854 kg). Mental Status: General Appearance [name]:  Neat Eye Contact:  Good Motor Behavior:  Normal Speech:  Normal Level of Consciousness:  Alert Mood:  Euthymic Affect:  Appropriate Anxiety Level:  Minimal Thought Process:  Coherent Thought Content:  WNL Perception:  Normal Judgment:  Fair Insight:  Absent Cognition:  Orientation time, place and person Sleep:  Number of Hours: 6     Assessment/Plan:  [name] is up and active in the milieu and is requesting D/C tomorrow to stay with his employer who has been supportive of him for several years.  Medications will be requested and samples will be arranged so that he can leave early tomorrow if his status continues to improve.     Lab Results:   BMET    Component Value Date/Time   NA 137 12/31/2010 2000   K 3.5 12/31/2010 2000   CL 101 12/31/2010 2000   CO2 25 12/31/2010 2000   GLUCOSE 81 12/31/2010 2000   BUN 5* 12/31/2010 2000   CREATININE 0.75 12/31/2010 2000   CALCIUM 9.2 12/31/2010 2000   GFRNONAA >90 12/31/2010 2000   GFRAA >90 12/31/2010 2000    Medications:  Scheduled:     . chlordiazePOXIDE  25 mg Oral TID   Followed by  . chlordiazePOXIDE  25 mg Oral BH-qamhs   Followed by  . chlordiazePOXIDE  25 mg Oral Daily  . citalopram  20 mg Oral Daily  . multivitamins ther. w/minerals  1 tablet Oral Daily  . thiamine  100 mg Oral Daily  . traZODone  50 mg Oral QHS     PRN Meds acetaminophen, alum & mag hydroxide-simeth, loperamide, magnesium hydroxide, ondansetron Sample medications will be requested for him to D/C in AM.  [name] [facility] 01/06/2011

## 2011-01-06 NOTE — Progress Notes (Signed)
Pt has had a rough day due to his tooth ache. Pt was finally given some medication and it seems to be working a little better. Pt rated his depression and hopelessness at a 8 due to his pain in his tooth. Pt did attend some groups today. Pt was offered support and encouragement. Pt denies SI/HI.

## 2011-01-06 NOTE — Progress Notes (Signed)
Type of Therapy:  Counseling Group at 11:00 am   Participation Level:  Active  Participation Quality:  Appropriate although distracted at times  Affect:  Appropriate  Cognitive:  Oriented  Insight:  Limited  Engagement in Group:  Good  Engagement in Therapy:  Limited  Modes of Intervention:  Exploration, clarification, and limit setting  Summary of Progress/Problems: Group processing section focused on a balanced living. Patient was distracted at times by friends and group but ultimately was engaged. Patient describes when he feels out of balance he is restless and irritable and he knows when he is in balance he is able to sit still for longer periods at times. Patient regrets this may over the amount of money he has spent on his addiction versus doing things he might enjoy including travel.  [name] 01/06/2011 3:32 PM  Type of Therapy:  Counseling Group at 1:15 PM  Participation Level:  Minimal  Participation Quality:  Resistant  Affect:  Irritable  Cognitive:  Oriented  Insight:  None shown; patient was experiencing toothache  Engagement in Group:  Minimal  Engagement in Therapy:  Minimal  Modes of Intervention:  Exploration and support  Summary of Progress/Problems: Toby was present in the room for group session yet did not participate much do to toothache he is experiencing. Patient was holding ice pack and comforting himself by keeping his eyes closed.  [name] 01/06/2011 3:32 PM

## 2011-01-06 NOTE — Progress Notes (Signed)
Recreation Therapy Group Note  Date: 01/06/2011         Time: 1415      Group Topic/Focus: The focus of this group is on enhancing patients' problem solving skills, which involves identifying the problem, brainstorming solutions and choosing and trying a solution.   Participation Level: Active  Participation Quality: Redirectable  Affect: Excited   Cognitive: Oriented   Additional Comments: None.  [name] 01/06/2011 3:52 PM

## 2011-01-06 NOTE — Progress Notes (Signed)
Pt attended AM group,  Good participation.  Only complaint is some tooth pain which is interrupting sleep.  Plans to d/c tomorrow.  Louann Sjogren will pick him up at 9AM.  Follow up at [facility], 90 mtgs in 90 days. All inpt goals met.

## 2011-01-07 MED ORDER — CITALOPRAM HYDROBROMIDE 10 MG PO TABS: 30.0000 mg | ORAL_TABLET | Freq: Every day | ORAL | Status: DC

## 2011-01-07 MED ORDER — PENICILLIN V POTASSIUM 500 MG PO TABS: 500.0000 mg | ORAL_TABLET | Freq: Four times a day (QID) | ORAL | Status: AC

## 2011-01-07 NOTE — Progress Notes (Signed)
  [facility] Counselor Group Notes  Type of Therapy:  Processing Group at 11:00AM  Participation Level:  Active  Participation Quality:  Attentive sharing and monopolizing  Affect:  Anxious  Cognitive:  Oriented  Insight:  Poor  Engagement in Group:  Good  Engagement in Therapy:  Limited  Modes of Intervention:  Exploration clarification and redirection  Summary of Progress/Problemms: Patient's contribution to topic of feelings about relapse were somewhat off-topic. Patient is clear on his followup plan and his work schedule and the fact that alcohol has caused problems for him. Patient unable to engage in the process of how to change one's thinking. [name] 01/07/2011 4:52 PM      Type of Therapy:  Counseling Group at 1:15PM  Participation Level:  Did Not Attend  [name] 01/07/2011 4:49 PM

## 2011-01-07 NOTE — Progress Notes (Signed)
Change of plans.  Apparently boss was out of town yesterday and today.  D/c tomorrow at 8:30AM.

## 2011-01-07 NOTE — Progress Notes (Signed)
[name] is a 45 y.o. male [id number] 10-26-1965   Diagnosis:  Axis I: Alcohol Abuse Active Problems:  Alcoholism /alcohol abuse  Dental Pain   Vital Signs:Blood pressure 122/97, pulse 62, temperature 97.7 F (36.5 C), temperature source Oral, resp. rate 18, height 5\' 6"  (1.676 m), weight 154 lb (69.854 kg).  Subjective/Objective:  [name] is up dressed and active in the group milieu.  He looks much better today and he smiles and tells me he feels 75% better overall, and that his dental pain has improved drastically.  He is well pleased today.  He is anticipating discharge tomorrow to go to his employer's home for recovery.  He is much more pleasant today.  Mental Status: General Appearance [name]:  Neat Eye Contact:  Good Motor Behavior:  Normal Speech:  Normal Level of Consciousness:  Alert Mood:  Euthymic Affect:  Appropriate Anxiety Level:  Minimal Thought Process:  Coherent Thought Content:  WNL Perception:  Normal Judgment:  Fair Insight:  Present Cognition:  Orientation time, place and person Sleep:  Number of Hours: 5.75     Assessment/Plan: Medications and support care were ordered yesterday for [name] which seemed to have helped greatly.  He is doing well.  His medications will be ordered today so that he can be discharged by 9 AM tomorrow morning when his boss will pick him up at the hospital entrance.  Lab Results:   BMET    Component Value Date/Time   NA 137 12/31/2010 2000   K 3.5 12/31/2010 2000   CL 101 12/31/2010 2000   CO2 25 12/31/2010 2000   GLUCOSE 81 12/31/2010 2000   BUN 5* 12/31/2010 2000   CREATININE 0.75 12/31/2010 2000   CALCIUM 9.2 12/31/2010 2000   GFRNONAA >90 12/31/2010 2000   GFRAA >90 12/31/2010 2000    Medications:  Scheduled:     . chlordiazePOXIDE  25 mg Oral BH-qamhs   Followed by  . chlordiazePOXIDE  25 mg Oral Daily  . citalopram  30 mg Oral Daily  . ibuprofen  600 mg Oral QID  . ibuprofen  800 mg Oral Once  .  multivitamins ther. w/minerals  1 tablet Oral Daily  . penicillin v potassium  500 mg Oral QID  . thiamine  100 mg Oral Daily  . traZODone  100 mg Oral QHS  . DISCONTD: citalopram  20 mg Oral Daily  . DISCONTD: traZODone  50 mg Oral QHS     PRN Meds acetaminophen, alum & mag hydroxide-simeth, benzocaine, ibuprofen, loperamide, magnesium hydroxide, ondansetron   [name] T. [name] [facility] 01/07/2011

## 2011-01-08 MED ORDER — PENICILLIN V POTASSIUM 500 MG PO TABS
500.0000 mg | ORAL_TABLET | Freq: Three times a day (TID) | ORAL | Status: DC
  Filled 2011-01-08 (×3): qty 1

## 2011-01-08 NOTE — Progress Notes (Signed)
[facility] Case Management Discharge Plan:  Will you be returning to the same living situation after discharge: Yes,  going home. Would you like a referral for services when you are discharged:Yes,  [facility]. Do you have access to transportation at discharge:Yes,  boss picked him up. Do you have the ability to pay for your medications:Yes,    Interagency Information:     Patient to Follow up at:  Follow-up Information    Follow up with [facility] on 01/13/2011. (1:45 with Dr Dicky Doe)    Contact information:   [address]  [place]  [phone]      Follow up with [facility] AA mtgs in 90 days,  secure a sponser.         Patient denies SI/HI:   Yes,      Safety Planning and Suicide Prevention discussed:  Yes,    Barrier to discharge identified:No.  Summary and Recommendations: He denies all S/I and H/I. Pt is clear for D/C.  Collins, Rayni 01/08/2011, 10:06 AM

## 2011-01-08 NOTE — Progress Notes (Addendum)
Patient ID: [name], male   DOB: September 09, 1965, 45 y.o.   MRN: [id number] Nursing    Pt.discharged to boss,He denies S/I,H/I and A/V Hallucinations.He is going to the dentist for his toothache and then to work. He states he will start AA tonight. Escorted to the lobby.Sample meds given to patient.

## 2011-01-08 NOTE — Progress Notes (Signed)
Pt presents this evening with complaints of a toothache. Pt was given some Orajel to apply to his gums to alleviate his pain. Pt is currently anticipating his discharge and wanted reassurance that his paper work and other documentation were ready to go. Pt has been informed that we are are and have his packet ready. Pt primary concern at this moment is his toothache. Pt attended this evening's AA and reported that the meeting was beneficial. Support and availability as needed has been extended to this patient. Pt safety remains with q75min checks.

## 2011-01-08 NOTE — Progress Notes (Signed)
Suicide Risk Assessment  Discharge Assessment     Demographic factors:  Assessment Details Time of Assessment: Admission Information Obtained From: Patient Current Mental Status:  Current Mental Status: Suicidal ideation indicated by patient Risk Reduction Factors:  Risk Reduction Factors: Living with another person, especially a relative  CLINICAL FACTORS:   Alcohol/Substance Abuse/Dependencies  COGNITIVE FEATURES THAT CONTRIBUTE TO RISK:  Polarized thinking    SUICIDE RISK:   Minimal: No identifiable suicidal ideation.  Patients presenting with no risk factors but with morbid ruminations; may be classified as minimal risk based on the severity of the depressive symptoms  PLAN OF CARE: Followup with [facility] on December 13 and continue to attend AA meeting  Robert Carver T. 01/08/2011, 11:04 AM

## 2011-01-08 NOTE — Discharge Summary (Signed)
  Patient ID: [name] MRN: [id number] DOB/AGE: 1965-10-25 45 y.o.  Admit date: 01/01/2011 Discharge date: 01/08/2011  Admission Diagnoses: Alcoholism/alcohol Abuse   Hospital Course: [name] is a 45 year old male who presented to the ED on 01-01-11 with complaint of suicide ideation and a request for detox from alcohol. [name] per history has long standing alcoholic problems. He has had numerous hospital admissions for detox this year alone. During the course of his hospitalization here, patient received medication management as well as group therapy and activities. Prior to discharge and upon discharge, patient denies suicidal, homicidal ideations. He denies auditory/visual hallucinations. He is without seizures activities present on admission.    Discharge Diagnoses:  Active Problems:  Alcoholism /alcohol abuse   Discharged Condition: Improved    Current Discharge Medication List    START taking these medications   Details  penicillin v potassium (VEETID) 500 MG tablet Take 1 tablet (500 mg total) by mouth 4 (four) times daily. Qty: 32 tablet, Refills: 0      CONTINUE these medications which have CHANGED   Details  citalopram (CELEXA) 10 MG tablet Take 3 tablets (30 mg total) by mouth daily. Qty: 90 tablet, Refills: 0      CONTINUE these medications which have NOT CHANGED   Details  diphenhydrAMINE (BENADRYL) 25 mg capsule Take 25 mg by mouth at bedtime as needed. For sleep.        Follow-up Information    Follow up with [facility] on 01/13/2011. (1:45 with Dr Dicky Doe)    Contact information:   [address]  [place]  [phone]      Follow up with [facility] AA mtgs in 90 days,  secure a sponser.         SignedArmandina Stammer I 01/08/2011, 10:06 AM

## 2011-01-10 NOTE — Progress Notes (Signed)
Patient Discharge Instructions:  Admission Note Faxed,  12/10 Discharge Note Faxed,   12/10 After Visit Summary Faxed,  12/10 Faxed to the Next Level Care provider:  Dimple Casey, Barbaraann Share, 01/10/2011, 4:24 PM

## 2011-04-23 IMAGING — CR DG CHEST 2V
2 series · 2 of 2 positions shown · non-contrast
Comparison: None

CLINICAL DATA: Rectal bleeding.  Vomiting.

CHEST - 2 VIEW

[w chest pa]
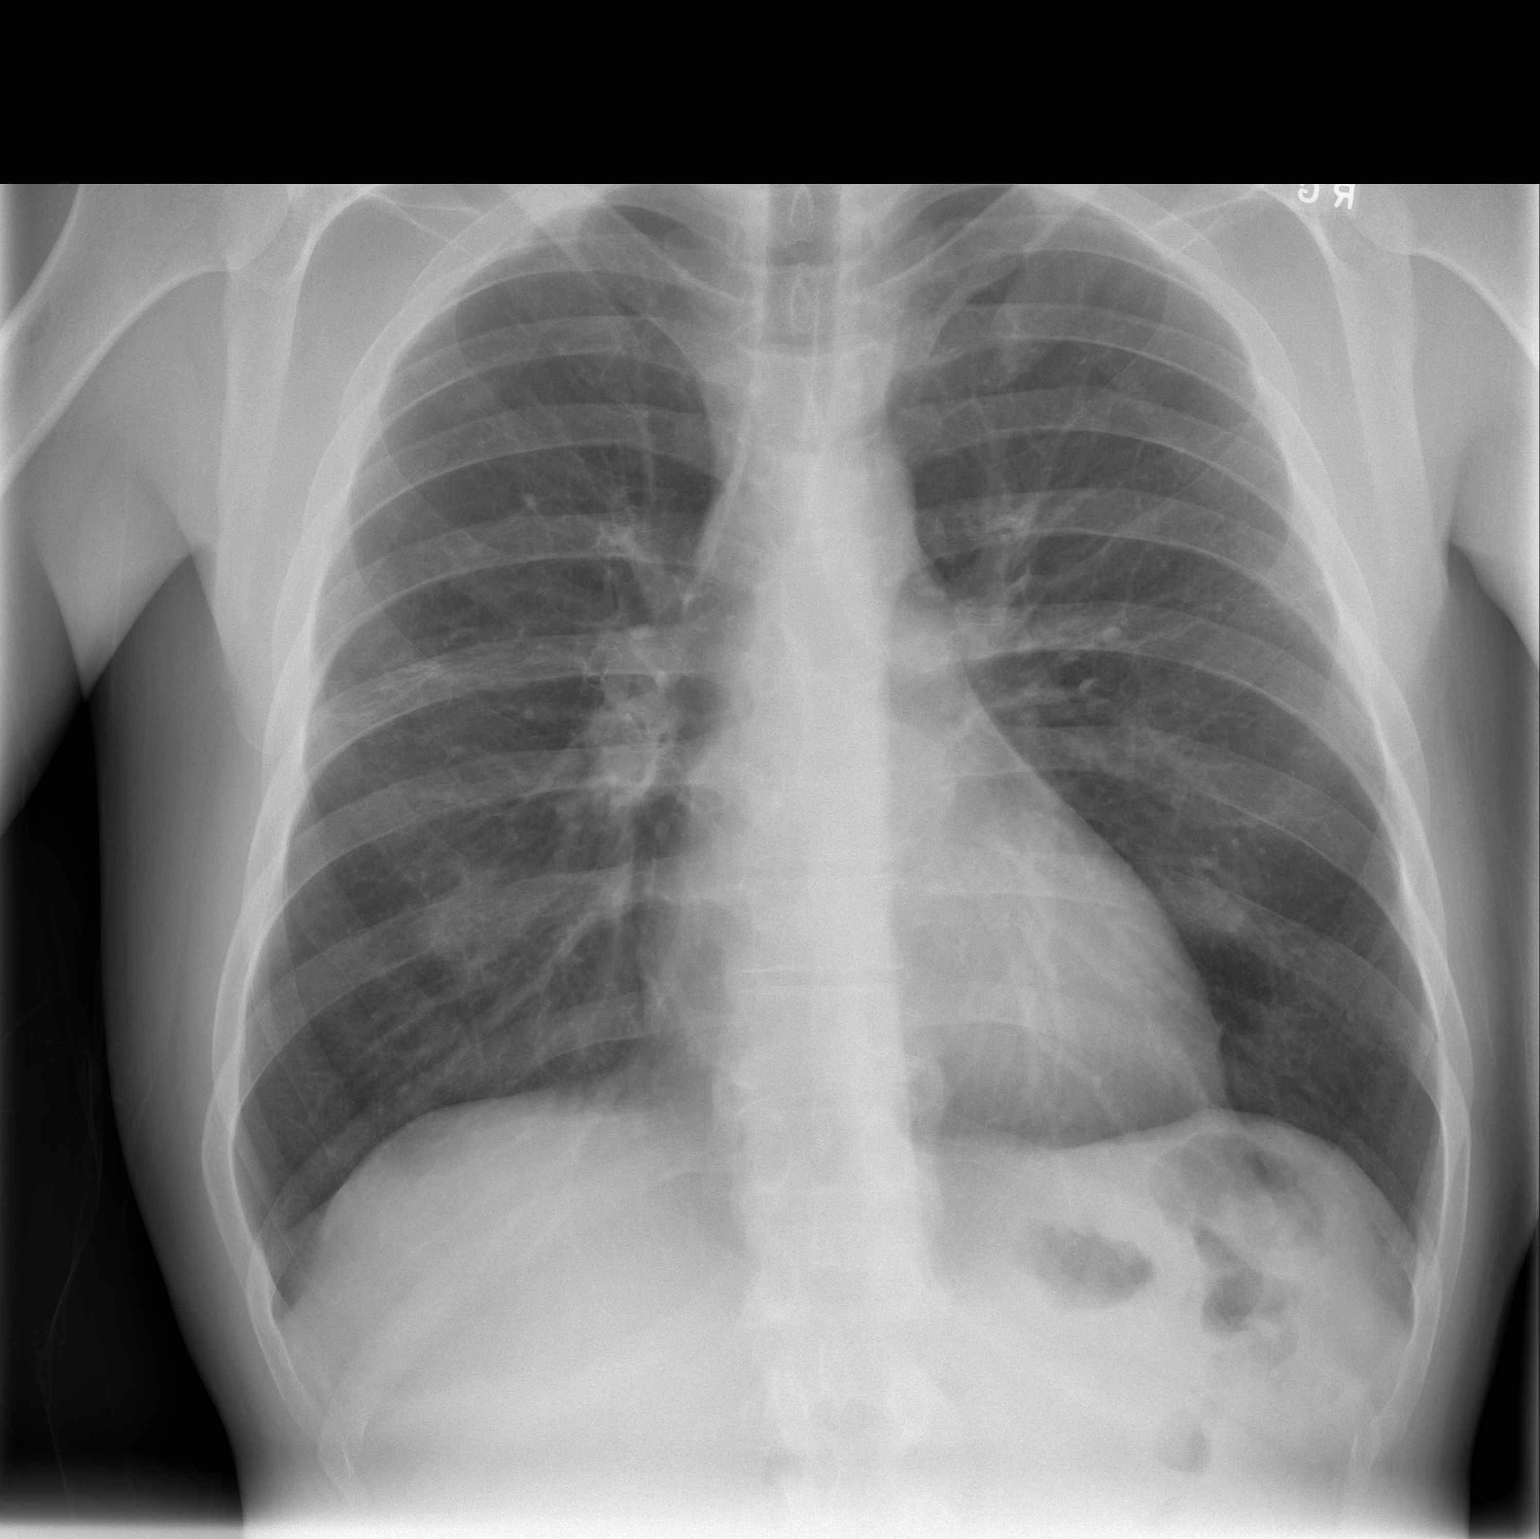

[w chest lat]
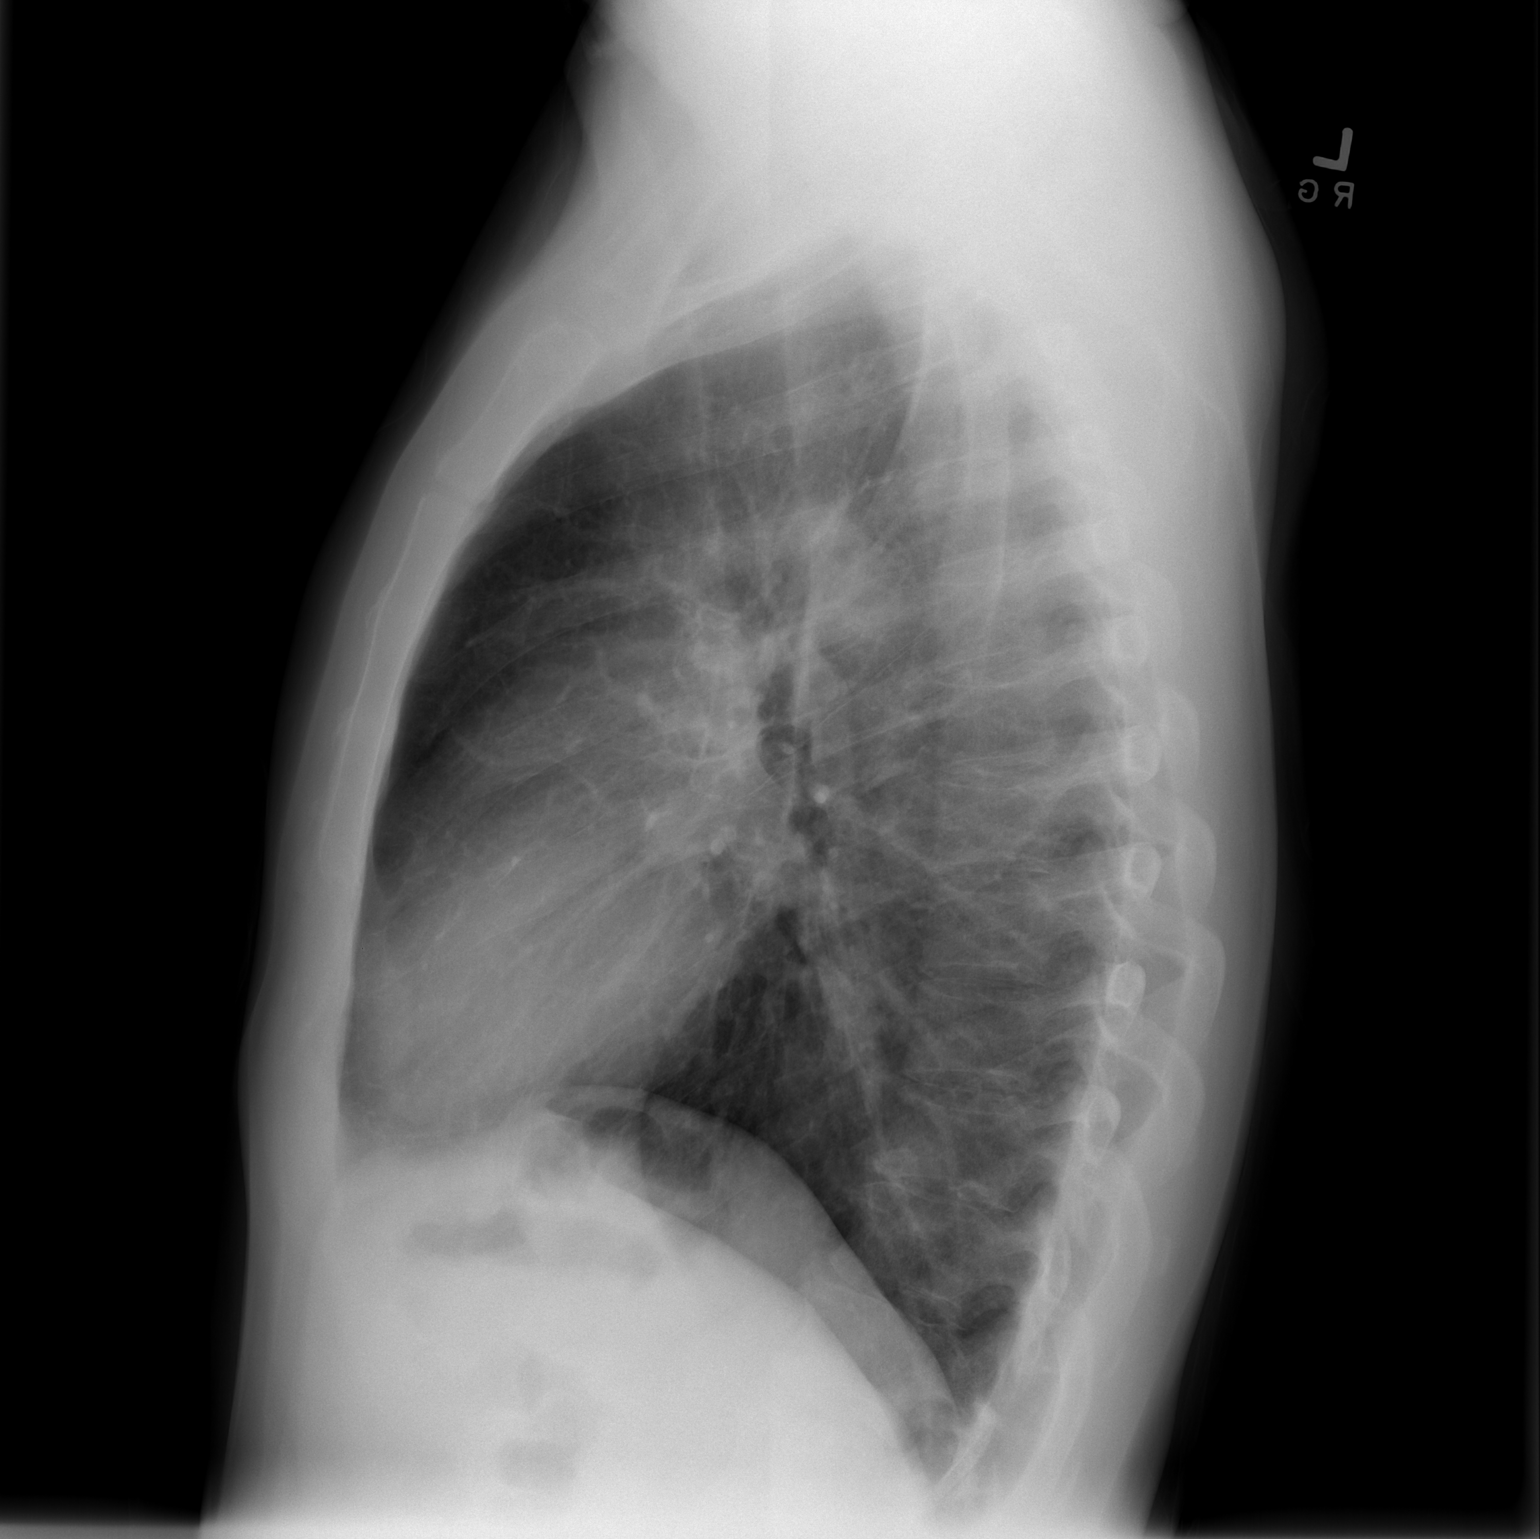

[2 of 2 positions shown; findings below may reference images not displayed]

FINDINGS: Two views of the chest were obtained.  Few linear
densities in the lateral right lung may represent atelectasis.
There is no focal airspace disease.  The heart and mediastinum are
within normal limits.  Bony structures are intact.
IMPRESSION: No acute chest findings.

## 2011-06-06 ENCOUNTER — Encounter (HOSPITAL_COMMUNITY): Payer: Self-pay | Admitting: *Deleted

## 2011-06-06 ENCOUNTER — Emergency Department (HOSPITAL_COMMUNITY)
Admission: EM | Admit: 2011-06-06 | Discharge: 2011-06-06 | Disposition: A | Discharge status: Rehabilitation Facility | Payer: Self-pay | Attending: Emergency Medicine | Admitting: Emergency Medicine

## 2011-06-06 DIAGNOSIS — R279 Unspecified lack of coordination: Secondary | ICD-10-CM | POA: Insufficient documentation

## 2011-06-06 DIAGNOSIS — F1020 Alcohol dependence, uncomplicated: Secondary | ICD-10-CM | POA: Insufficient documentation

## 2011-06-06 LAB — CBC
HCT: 46.2 % (ref 39.0–52.0)
MCV: 91.3 fL (ref 78.0–100.0)
Platelets: 296 10*3/uL (ref 150–400)
RBC: 5.06 MIL/uL (ref 4.22–5.81)
WBC: 8 10*3/uL (ref 4.0–10.5)

## 2011-06-06 LAB — COMPREHENSIVE METABOLIC PANEL
AST: 39 U/L — ABNORMAL HIGH (ref 0–37)
Alkaline Phosphatase: 78 U/L (ref 39–117)
BUN: 6 mg/dL (ref 6–23)
CO2: 28 mEq/L (ref 19–32)
Chloride: 100 mEq/L (ref 96–112)
Creatinine, Ser: 0.91 mg/dL (ref 0.50–1.35)
GFR calc non Af Amer: >90 mL/min (ref >90)
Total Bilirubin: 0.3 mg/dL (ref 0.3–1.2)

## 2011-06-06 LAB — RAPID URINE DRUG SCREEN, HOSP PERFORMED
Opiates: NOT DETECTED
Tetrahydrocannabinol: NOT DETECTED

## 2011-06-06 MED ORDER — CITALOPRAM HYDROBROMIDE 20 MG PO TABS
30.0000 mg | ORAL_TABLET | Freq: Every day | ORAL | Status: DC
  Administered 2011-06-06: 30 mg via ORAL
  Filled 2011-06-06: qty 1

## 2011-06-06 MED ORDER — ALUM & MAG HYDROXIDE-SIMETH 200-200-20 MG/5ML PO SUSP: 30.0000 mL | ORAL | Status: DC | PRN

## 2011-06-06 MED ORDER — LORAZEPAM 1 MG PO TABS
1.0000 mg | ORAL_TABLET | Freq: Three times a day (TID) | ORAL | Status: DC | PRN
  Administered 2011-06-06: 1 mg via ORAL
  Filled 2011-06-06: qty 1

## 2011-06-06 MED ORDER — ZOLPIDEM TARTRATE 5 MG PO TABS: 5.0000 mg | ORAL_TABLET | Freq: Every evening | ORAL | Status: DC | PRN

## 2011-06-06 MED ORDER — ONDANSETRON 4 MG PO TBDP
8.0000 mg | ORAL_TABLET | Freq: Once | ORAL | Status: AC
  Administered 2011-06-06: 8 mg via ORAL
  Filled 2011-06-06: qty 2

## 2011-06-06 MED ORDER — NICOTINE 21 MG/24HR TD PT24: 21.0000 mg | MEDICATED_PATCH | Freq: Every day | TRANSDERMAL | Status: DC

## 2011-06-06 MED ORDER — ONDANSETRON HCL 8 MG PO TABS
4.0000 mg | ORAL_TABLET | Freq: Three times a day (TID) | ORAL | Status: DC | PRN
  Administered 2011-06-06: 4 mg via ORAL
  Filled 2011-06-06: qty 1

## 2011-06-06 MED ORDER — IBUPROFEN 200 MG PO TABS: 600.0000 mg | ORAL_TABLET | Freq: Three times a day (TID) | ORAL | Status: DC | PRN

## 2011-06-06 MED ORDER — CITALOPRAM HYDROBROMIDE 10 MG PO TABS: 30.0000 mg | ORAL_TABLET | Freq: Every day | ORAL | Status: DC

## 2011-06-06 NOTE — ED Notes (Signed)
[name], [facility] member at bedside currently
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
## 2011-06-06 NOTE — ED Notes (Signed)
Gave pt. aurinal  For urine .ask x2 the patient.states he cannot void at time.

## 2011-06-06 NOTE — ED Notes (Signed)
Spoke to [place], Charity fundraiser at [facility] to make sure she had no further questions about patient.

## 2011-06-06 NOTE — ED Provider Notes (Signed)
History     CSN: [id number]  Arrival date & time 06/06/11  9528   First MD Initiated Contact with Patient 06/06/11 [phone]      Chief Complaint  Patient presents with  . Alcohol Intoxication    (Consider location/radiation/quality/duration/timing/severity/associated sxs/prior treatment) HPI 46 year old male presents to emergency department with report of alcohol intoxication. Patient requesting detox. Patient reports last drink about 10 minutes ago. Patient reports he had 13 40s over the course of Sunday. Patient recent rehabilitation 72 days ago, recently "fell off the wagon". Patient denies any drug use. Patient last had detox at [facility].  He reports he was going to meetings, but stopped. Patient denies prior history of alcohol withdrawal symptoms. He reports nausea fatigue. Patient is upset that he started drinking again   Past Medical History  Diagnosis Date  . Hypertension   . Alcoholism /alcohol abuse     Past Surgical History  Procedure Date  . Hernia repair     History reviewed. No pertinent family history.  History  Substance Use Topics  . Smoking status: Current Everyday Smoker -- 3.0 packs/day    Types: Cigarettes  . Smokeless tobacco: Not on file  . Alcohol Use: Yes     Drinks every day, 9-11 40 oz beers.      Review of Systems  All other systems reviewed and are negative.    Allergies  Review of patient's allergies indicates no known allergies.  Home Medications   Current Outpatient Rx  Name Route Sig Dispense Refill  . CITALOPRAM HYDROBROMIDE 10 MG PO TABS Oral Take 3 tablets (30 mg total) by mouth daily. 90 tablet 0  . DIPHENHYDRAMINE HCL 25 MG PO CAPS Oral Take 25 mg by mouth at bedtime as needed. For sleep.       BP 100/70  Pulse 96  Temp 98.1 F (36.7 C)  Resp 22  SpO2 99%  Physical Exam  Nursing note and vitals reviewed. Constitutional: He is oriented to person, place, and time. He appears well-developed and well-nourished.  HENT:    Head: Normocephalic and atraumatic.  Nose: Nose normal.  Mouth/Throat: Oropharynx is clear and moist.  Eyes: Conjunctivae and EOM are normal. Pupils are equal, round, and reactive to light.  Neck: Normal range of motion. Neck supple. No JVD present. No tracheal deviation present. No thyromegaly present.  Cardiovascular: Normal rate, regular rhythm, normal heart sounds and intact distal pulses.  Exam reveals no gallop and no friction rub.   No murmur heard. Pulmonary/Chest: Effort normal and breath sounds normal. No stridor. No respiratory distress. He has no wheezes. He has no rales. He exhibits no tenderness.  Abdominal: Soft. Bowel sounds are normal. He exhibits no distension and no mass. There is no tenderness. There is no rebound and no guarding.  Musculoskeletal: Normal range of motion. He exhibits no edema and no tenderness.  Lymphadenopathy:    He has no cervical adenopathy.  Neurological: He is alert and oriented to person, place, and time. No cranial nerve deficit. He exhibits normal muscle tone. Coordination abnormal.  Skin: Skin is dry. No rash noted. No erythema. No pallor.  Psychiatric: His behavior is normal. Judgment and thought content normal.       Flat affect tearful    ED Course  Procedures (including critical care time)  Labs Reviewed  COMPREHENSIVE METABOLIC PANEL - Abnormal; Notable for the following:    AST 39 (*)    All other components within normal limits  ETHANOL - Abnormal; Notable for  the following:    Alcohol, Ethyl (B) 124 (*)    All other components within normal limits  CBC  URINE RAPID DRUG SCREEN (HOSP PERFORMED)   No results found.   1. Alcoholism /alcohol abuse       MDM  46 rolled male with alcohol intoxication requesting detox. We'll allow to sober, contact act for possible placement at a rehabilitation facility  5:52 AM Discussed with Tammy Sours from [facility] who will see/evaluate patient for help with his alcoholism      Olivia Mackie,  MD 06/06/11 [phone]

## 2011-06-06 NOTE — ED Notes (Signed)
Dr Norlene Campbell spoke with [facility] team regarding patient.

## 2011-06-06 NOTE — BH Assessment (Signed)
Assessment Note  Update:  Received call from Toniann Fail stating pt was accepted to [facility] if pt has 2 weeks of medications in hand.  Theses obtained from EDP Radford Pax and Case Manager Bunting in ED.  [facility] to pick pt up to transport to [facility] at 1430.  Updated EDP Radford Pax and ED staff.  Updated assessment disposition, completed assessment notification and faxed to [facility] to log.       Disposition:  Disposition Disposition of Patient: Inpatient treatment program Type of inpatient treatment program: Adult Patient referred to: [facility] (Pt accepted [facility])  On Site Evaluation by:   Reviewed with Physician:  Kathreen Cosier, [name] 06/06/2011 2:46 PM

## 2011-06-06 NOTE — ED Notes (Signed)
Patient states he ingested 14 40oz beers and has feelings of hopelessness

## 2011-06-06 NOTE — ED Notes (Signed)
Clothing inventoried and placed in locker #3.  Checklist completed, will call [facility] team later after patient has had time to sober up.  States he has feeling of hopelessness without any intentions of hurting himself or others.  Placed in paper scrubs and security here to wand patient.  Wanding completed and everything was OK.

## 2011-07-03 ENCOUNTER — Encounter (HOSPITAL_COMMUNITY): Payer: Self-pay | Admitting: Emergency Medicine

## 2011-07-03 ENCOUNTER — Emergency Department (HOSPITAL_COMMUNITY)
Admission: EM | Admit: 2011-07-03 | Discharge: 2011-07-04 | Disposition: A | Discharge status: Other Acute Inpatient Hospital | Payer: Self-pay

## 2011-07-03 DIAGNOSIS — F3289 Other specified depressive episodes: Secondary | ICD-10-CM | POA: Insufficient documentation

## 2011-07-03 DIAGNOSIS — F1010 Alcohol abuse, uncomplicated: Secondary | ICD-10-CM | POA: Insufficient documentation

## 2011-07-03 DIAGNOSIS — F329 Major depressive disorder, single episode, unspecified: Secondary | ICD-10-CM | POA: Insufficient documentation

## 2011-07-03 DIAGNOSIS — R45851 Suicidal ideations: Secondary | ICD-10-CM | POA: Insufficient documentation

## 2011-07-03 DIAGNOSIS — E876 Hypokalemia: Secondary | ICD-10-CM | POA: Insufficient documentation

## 2011-07-03 LAB — CBC
Hemoglobin: 13.8 g/dL (ref 13.0–17.0)
MCH: 31.7 pg (ref 26.0–34.0)
RBC: 4.36 MIL/uL (ref 4.22–5.81)
WBC: 4.4 10*3/uL (ref 4.0–10.5)

## 2011-07-03 LAB — COMPREHENSIVE METABOLIC PANEL
ALT: 18 U/L (ref 0–53)
Alkaline Phosphatase: 60 U/L (ref 39–117)
CO2: 24 mEq/L (ref 19–32)
Calcium: 8.7 mg/dL (ref 8.4–10.5)
Chloride: 101 mEq/L (ref 96–112)
GFR calc Af Amer: >90 mL/min (ref >90)
GFR calc non Af Amer: >90 mL/min (ref >90)
Glucose, Bld: 87 mg/dL (ref 70–99)
Potassium: 3.3 mEq/L — ABNORMAL LOW (ref 3.5–5.1)
Sodium: 136 mEq/L (ref 135–145)
Total Bilirubin: 0.3 mg/dL (ref 0.3–1.2)

## 2011-07-03 LAB — RAPID URINE DRUG SCREEN, HOSP PERFORMED
Barbiturates: NOT DETECTED
Tetrahydrocannabinol: NOT DETECTED

## 2011-07-03 MED ORDER — ALUM & MAG HYDROXIDE-SIMETH 200-200-20 MG/5ML PO SUSP: 30.0000 mL | ORAL | Status: DC | PRN

## 2011-07-03 MED ORDER — ACETAMINOPHEN 325 MG PO TABS: 650.0000 mg | ORAL_TABLET | ORAL | Status: DC | PRN

## 2011-07-03 MED ORDER — LORAZEPAM 1 MG PO TABS
1.0000 mg | ORAL_TABLET | Freq: Three times a day (TID) | ORAL | Status: DC | PRN
  Administered 2011-07-04: 1 mg via ORAL
  Filled 2011-07-03: qty 1

## 2011-07-03 MED ORDER — NICOTINE 21 MG/24HR TD PT24
21.0000 mg | MEDICATED_PATCH | Freq: Every day | TRANSDERMAL | Status: DC
  Filled 2011-07-03: qty 1

## 2011-07-03 MED ORDER — ZOLPIDEM TARTRATE 10 MG PO TABS: 10.0000 mg | ORAL_TABLET | Freq: Every evening | ORAL | Status: DC | PRN

## 2011-07-03 MED ORDER — ONDANSETRON 4 MG PO TBDP
4.0000 mg | ORAL_TABLET | Freq: Once | ORAL | Status: AC
  Administered 2011-07-03: 4 mg via ORAL
  Filled 2011-07-03: qty 1

## 2011-07-03 MED ORDER — POTASSIUM CHLORIDE CRYS ER 20 MEQ PO TBCR
40.0000 meq | EXTENDED_RELEASE_TABLET | Freq: Every day | ORAL | Status: DC
  Administered 2011-07-03 – 2011-07-04 (×2): 40 meq via ORAL
  Filled 2011-07-03 (×2): qty 2

## 2011-07-03 MED ORDER — ONDANSETRON HCL 4 MG PO TABS: 4.0000 mg | ORAL_TABLET | Freq: Three times a day (TID) | ORAL | Status: DC | PRN

## 2011-07-03 MED ORDER — IBUPROFEN 600 MG PO TABS: 600.0000 mg | ORAL_TABLET | Freq: Three times a day (TID) | ORAL | Status: DC | PRN

## 2011-07-03 NOTE — ED Notes (Signed)
Pt alert, nad, arrives via [employer], c/o "want detox from alcohol", resp even unlabored, skin pwd

## 2011-07-03 NOTE — ED Notes (Signed)
MD bedside

## 2011-07-04 ENCOUNTER — Inpatient Hospital Stay (HOSPITAL_COMMUNITY)
Admission: AD | Admit: 2011-07-04 | Discharge: 2011-07-12 | DRG: 897 | Disposition: A | Discharge status: Home / Self Care | Payer: Federal, State, Local not specified - Other | Source: Ambulatory Visit | Attending: Psychiatry | Admitting: Psychiatry

## 2011-07-04 ENCOUNTER — Encounter (HOSPITAL_COMMUNITY): Payer: Self-pay | Admitting: *Deleted

## 2011-07-04 DIAGNOSIS — F10988 Alcohol use, unspecified with other alcohol-induced disorder: Principal | ICD-10-CM | POA: Diagnosis present

## 2011-07-04 DIAGNOSIS — F10939 Alcohol use, unspecified with withdrawal, unspecified: Secondary | ICD-10-CM | POA: Diagnosis present

## 2011-07-04 DIAGNOSIS — F17200 Nicotine dependence, unspecified, uncomplicated: Secondary | ICD-10-CM | POA: Diagnosis present

## 2011-07-04 DIAGNOSIS — F10229 Alcohol dependence with intoxication, unspecified: Secondary | ICD-10-CM

## 2011-07-04 DIAGNOSIS — I10 Essential (primary) hypertension: Secondary | ICD-10-CM | POA: Diagnosis present

## 2011-07-04 DIAGNOSIS — Z79899 Other long term (current) drug therapy: Secondary | ICD-10-CM

## 2011-07-04 DIAGNOSIS — F411 Generalized anxiety disorder: Secondary | ICD-10-CM | POA: Diagnosis present

## 2011-07-04 DIAGNOSIS — F10239 Alcohol dependence with withdrawal, unspecified: Secondary | ICD-10-CM | POA: Diagnosis present

## 2011-07-04 DIAGNOSIS — F1994 Other psychoactive substance use, unspecified with psychoactive substance-induced mood disorder: Secondary | ICD-10-CM

## 2011-07-04 DIAGNOSIS — F79 Unspecified intellectual disabilities: Secondary | ICD-10-CM | POA: Diagnosis present

## 2011-07-04 DIAGNOSIS — Z9149 Other personal history of psychological trauma, not elsewhere classified: Secondary | ICD-10-CM

## 2011-07-04 DIAGNOSIS — F1020 Alcohol dependence, uncomplicated: Secondary | ICD-10-CM | POA: Diagnosis present

## 2011-07-04 MED ORDER — ADULT MULTIVITAMIN W/MINERALS CH
1.0000 | ORAL_TABLET | Freq: Every day | ORAL | Status: DC
  Administered 2011-07-05: 1 via ORAL
  Filled 2011-07-04 (×2): qty 1

## 2011-07-04 MED ORDER — LOPERAMIDE HCL 2 MG PO CAPS: 2.0000 mg | ORAL_CAPSULE | ORAL | Status: DC | PRN

## 2011-07-04 MED ORDER — CHLORDIAZEPOXIDE HCL 25 MG PO CAPS
25.0000 mg | ORAL_CAPSULE | Freq: Four times a day (QID) | ORAL | Status: DC | PRN
  Administered 2011-07-04: 25 mg via ORAL

## 2011-07-04 MED ORDER — TRAZODONE HCL 100 MG PO TABS
100.0000 mg | ORAL_TABLET | Freq: Every day | ORAL | Status: DC
  Administered 2011-07-05 – 2011-07-11 (×7): 100 mg via ORAL
  Filled 2011-07-04 (×7): qty 1
  Filled 2011-07-04: qty 14
  Filled 2011-07-04: qty 1

## 2011-07-04 MED ORDER — NICOTINE 21 MG/24HR TD PT24
21.0000 mg | MEDICATED_PATCH | Freq: Every day | TRANSDERMAL | Status: DC
  Filled 2011-07-04 (×3): qty 1

## 2011-07-04 MED ORDER — ONDANSETRON 4 MG PO TBDP
4.0000 mg | ORAL_TABLET | Freq: Four times a day (QID) | ORAL | Status: AC | PRN
  Administered 2011-07-04: 4 mg via ORAL

## 2011-07-04 MED ORDER — THIAMINE HCL 100 MG/ML IJ SOLN
100.0000 mg | Freq: Once | INTRAMUSCULAR | Status: AC
  Administered 2011-07-04: 100 mg via INTRAMUSCULAR

## 2011-07-04 MED ORDER — CITALOPRAM HYDROBROMIDE 20 MG PO TABS
20.0000 mg | ORAL_TABLET | Freq: Every day | ORAL | Status: DC
  Administered 2011-07-04 (×2): 20 mg via ORAL
  Filled 2011-07-04 (×2): qty 1

## 2011-07-04 MED ORDER — TRAZODONE HCL 100 MG PO TABS
100.0000 mg | ORAL_TABLET | Freq: Every day | ORAL | Status: DC
Start: 2011-07-04 — End: 2011-07-04

## 2011-07-04 MED ORDER — MAGNESIUM HYDROXIDE 400 MG/5ML PO SUSP: 30.0000 mL | Freq: Every day | ORAL | Status: DC | PRN

## 2011-07-04 MED ORDER — VITAMIN B-1 100 MG PO TABS
100.0000 mg | ORAL_TABLET | Freq: Every day | ORAL | Status: DC
  Administered 2011-07-05: 100 mg via ORAL
  Filled 2011-07-04 (×2): qty 1

## 2011-07-04 MED ORDER — CHLORDIAZEPOXIDE HCL 25 MG PO CAPS
ORAL_CAPSULE | ORAL | Status: AC
  Administered 2011-07-04: 25 mg via ORAL
  Filled 2011-07-04: qty 1

## 2011-07-04 MED ORDER — ACETAMINOPHEN 325 MG PO TABS
650.0000 mg | ORAL_TABLET | Freq: Four times a day (QID) | ORAL | Status: DC | PRN
  Administered 2011-07-09: 650 mg via ORAL

## 2011-07-04 MED ORDER — ONDANSETRON 4 MG PO TBDP
ORAL_TABLET | ORAL | Status: AC
  Administered 2011-07-04: 4 mg via ORAL
  Filled 2011-07-04: qty 1

## 2011-07-04 MED ORDER — ALUM & MAG HYDROXIDE-SIMETH 200-200-20 MG/5ML PO SUSP: 30.0000 mL | ORAL | Status: DC | PRN

## 2011-07-04 MED ORDER — CITALOPRAM HYDROBROMIDE 10 MG/5ML PO SOLN
20.0000 mg | Freq: Every day | ORAL | Status: DC
  Filled 2011-07-04 (×2): qty 10

## 2011-07-04 MED ORDER — HYDROXYZINE HCL 25 MG PO TABS
25.0000 mg | ORAL_TABLET | Freq: Four times a day (QID) | ORAL | Status: DC | PRN
  Administered 2011-07-04: 25 mg via ORAL

## 2011-07-04 NOTE — ED Notes (Signed)
Report given to Berneice Heinrich, RN at [facility]. States they cannot take pt until 1900 d/t multiple recent discharges/busy staff.

## 2011-07-04 NOTE — ED Notes (Signed)
Patient states wants to eat lunch before he showers.
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
## 2011-07-04 NOTE — Progress Notes (Signed)
Pt. States he is tried of drinking and told his 46 year old daughter that ,"daddy is getting help to stop drinking." he stated his child was very happy. Pts very pleasant and cooperative and denies Si or HI. Presently is in the dayroom with the other pts. Contracts for safety. Last drink was yesterday he states he drank 10 40oz. C/o some nausea and some shakes. Medicated with librium,. zofran and visteral for this. Will monuitor closely. Report given to the next shift.

## 2011-07-04 NOTE — ED Notes (Signed)
Attempted to call report. Receiving nurse Maureen Ralphs reported that she talked to her [facility] and did not want to have patient transferred until 1800 (1 hour)

## 2011-07-04 NOTE — ED Provider Notes (Signed)
Filed Vitals:   07/04/11 0631  BP: 109/57  Pulse: 63  Temp: 98.2 F (36.8 C)  Resp: 20   Pt seen and assessed. NAD. Still requesting detox. Passive SI with no plan. Still awaiting possible placement for etoh detox.  Raeford Razor, MD 07/04/11 [phone]

## 2011-07-04 NOTE — ED Provider Notes (Signed)
History     CSN: [id number]  Arrival date & time 07/03/11  2126   First MD Initiated Contact with Patient 07/03/11 2301      Chief Complaint  Patient presents with  . Medical Clearance  . Alcohol Problem    (Consider location/radiation/quality/duration/timing/severity/associated sxs/prior treatment) HPI  Patient relates he has "fallen off the wagon" about 2 weeks ago. He relates his mother was recently murdered when she was at a bar. He relates he's drinking 11 40 ounces of beer a day. He relates he was last detoxed at work in May. He relates alcohol then a problem for him for 30 years. He denies any street drugs. He also has a long history of depression and anxiety and states "I need to get my life back on tract". He states he was at last admitted for depression about a month ago at behavioral health. He states he has some suicidal ideation and would cut his wrist. He shows me an old scar on his right from where he states he took a chainsaw to cut himself about 5 years ago.  PCP none Psychiatrist at [facility]  Past Medical History  Diagnosis Date  . Hypertension   . Alcoholism /alcohol abuse     Past Surgical History  Procedure Date  . Hernia repair     No family history on file.  History  Substance Use Topics  . Smoking status: Current Everyday Smoker -- 0.5 packs/day    Types: Cigarettes  . Smokeless tobacco: Not on file  . Alcohol Use: Yes     Drinks every day, 9-11 40 oz beers.  Employed Lives alone   Review of Systems  All other systems reviewed and are negative.    Allergies  Review of patient's allergies indicates no known allergies.  Home Medications  No current outpatient prescriptions on file.  BP 110/67  Pulse 65  Temp(Src) 97.7 F (36.5 C) (Oral)  Resp 20  Wt 205 lb (92.987 kg)  SpO2 97%  Vital signs normal    Physical Exam  Nursing note and vitals reviewed. Constitutional: He is oriented to person, place, and time. He appears  well-developed and well-nourished.  Non-toxic appearance. He does not appear ill. No distress.  HENT:  Head: Normocephalic and atraumatic.  Right Ear: External ear normal.  Left Ear: External ear normal.  Nose: Nose normal. No mucosal edema or rhinorrhea.  Mouth/Throat: Oropharynx is clear and moist and mucous membranes are normal. No dental abscesses or uvula swelling.  Eyes: Conjunctivae and EOM are normal. Pupils are equal, round, and reactive to light.  Neck: Normal range of motion and full passive range of motion without pain. Neck supple.  Cardiovascular: Normal rate, regular rhythm and normal heart sounds.  Exam reveals no gallop and no friction rub.   No murmur heard. Pulmonary/Chest: Effort normal and breath sounds normal. No respiratory distress. He has no wheezes. He has no rhonchi. He has no rales. He exhibits no tenderness and no crepitus.  Abdominal: Soft. Normal appearance and bowel sounds are normal. He exhibits no distension. There is no tenderness. There is no rebound and no guarding.  Musculoskeletal: Normal range of motion. He exhibits no edema and no tenderness.       Moves all extremities well.   Neurological: He is alert and oriented to person, place, and time. He has normal strength. No cranial nerve deficit.  Skin: Skin is warm, dry and intact. No rash noted. No erythema. No pallor.  Psychiatric: His speech  is normal and behavior is normal. His mood appears not anxious.       Flat affect    ED Course  Procedures (including critical care time)  Patient started on potassium supplementation for his mild hypokalemia. Patient states he was on Celexa 20 mg once a day and trazodone 100 mg at bedtime but he stopped about 2 months ago. These were restarted. Patient was actually seen in May 6 for similar complaints.  ACT consulted called, but have not responded.  Results for orders placed during the hospital encounter of 07/03/11  CBC      Component Value Range   WBC 4.4   4.0 - 10.5 (K/uL)   RBC 4.36  4.22 - 5.81 (MIL/uL)   Hemoglobin 13.8  13.0 - 17.0 (g/dL)   HCT 47.8  29.5 - 62.1 (%)   MCV 90.8  78.0 - 100.0 (fL)   MCH 31.7  26.0 - 34.0 (pg)   MCHC 34.8  30.0 - 36.0 (g/dL)   RDW 30.8  65.7 - 84.6 (%)   Platelets 226  150 - 400 (K/uL)  COMPREHENSIVE METABOLIC PANEL      Component Value Range   Sodium 136  135 - 145 (mEq/L)   Potassium 3.3 (*) 3.5 - 5.1 (mEq/L)   Chloride 101  96 - 112 (mEq/L)   CO2 24  19 - 32 (mEq/L)   Glucose, Bld 87  70 - 99 (mg/dL)   BUN 4 (*) 6 - 23 (mg/dL)   Creatinine, Ser 9.62  0.50 - 1.35 (mg/dL)   Calcium 8.7  8.4 - 95.2 (mg/dL)   Total Protein 7.1  6.0 - 8.3 (g/dL)   Albumin 4.0  3.5 - 5.2 (g/dL)   AST 22  0 - 37 (U/L)   ALT 18  0 - 53 (U/L)   Alkaline Phosphatase 60  39 - 117 (U/L)   Total Bilirubin 0.3  0.3 - 1.2 (mg/dL)   GFR calc non Af Amer >90  >90 (mL/min)   GFR calc Af Amer >90  >90 (mL/min)  ETHANOL      Component Value Range   Alcohol, Ethyl (B) 251 (*) 0 - 11 (mg/dL)  URINE RAPID DRUG SCREEN (HOSP PERFORMED)      Component Value Range   Opiates NONE DETECTED  NONE DETECTED    Cocaine NONE DETECTED  NONE DETECTED    Benzodiazepines NONE DETECTED  NONE DETECTED    Amphetamines NONE DETECTED  NONE DETECTED    Tetrahydrocannabinol NONE DETECTED  NONE DETECTED    Barbiturates NONE DETECTED  NONE DETECTED    Laboratory interpretation all normal except hypokalemia, alcohol intoxication     1. Alcohol abuse   2. Depression   3. Suicide ideation   4. Hypokalemia     Plan disposition per ACT  Devoria Albe, MD, FACEP     MDM          Ward Givens, MD 07/04/11 [phone]

## 2011-07-04 NOTE — Consult Note (Signed)
Reason for Consult: Alcohol intoxication and the requesting for detox Referring Physician:  Dr. Vena Austria is an 46 y.o. male.  HPI: Patient was seen and chart reviewed. Patient reported he was with his friends home and drinking, and called the [facility] Department requesting help for detox and treatment. Patient also endorses suicidal ideation, but no intention or plan. Patient has a history of for one suicidal attempt in the past. Patient reported he was recently released from the alcohol and substance abuse rehabilitation services - [facility]. Patient has history of multiple the hospitalization for the alcohol detoxification and the depression. Patient reportedly staying with his uncle in [place]. The house. Patient works in Radio broadcast assistant. Last work was Saturday. His blood alcohol level who was 251 mg/dL on arrival.  Patient was calm, and cooperative during this evaluation. He served lying down on his bed without distress. His reported mood was depressed. His affect was constricted. He has a normal speech and thought process. Patient has suicidal ideation without intentions or plans this does no homicidal ideation. He has no evidence of psychosis. Patient has poor insight, judgment and impulse control  Past Medical History  Diagnosis Date  . Hypertension   . Alcoholism /alcohol abuse     Past Surgical History  Procedure Date  . Hernia repair     No family history on file.  Social History:  reports that he has been smoking Cigarettes.  He has been smoking about 3 packs per day. He does not have any smokeless tobacco history on file. He reports that he drinks alcohol. He reports that he does not use illicit drugs.  Allergies: No Known Allergies  Medications: I have reviewed the patient's current medications.  Results for orders placed during the hospital encounter of 07/03/11 (from the past 48 hour(s))  CBC     Status: Normal   Collection Time   07/03/11 10:10 PM        Component Value Range Comment   WBC 4.4  4.0 - 10.5 (K/uL)    RBC 4.36  4.22 - 5.81 (MIL/uL)    Hemoglobin 13.8  13.0 - 17.0 (g/dL)    HCT 16.1  09.6 - 04.5 (%)    MCV 90.8  78.0 - 100.0 (fL)    MCH 31.7  26.0 - 34.0 (pg)    MCHC 34.8  30.0 - 36.0 (g/dL)    RDW 40.9  81.1 - 91.4 (%)    Platelets 226  150 - 400 (K/uL)   COMPREHENSIVE METABOLIC PANEL     Status: Abnormal   Collection Time   07/03/11 10:10 PM      Component Value Range Comment   Sodium 136  135 - 145 (mEq/L)    Potassium 3.3 (*) 3.5 - 5.1 (mEq/L)    Chloride 101  96 - 112 (mEq/L)    CO2 24  19 - 32 (mEq/L)    Glucose, Bld 87  70 - 99 (mg/dL)    BUN 4 (*) 6 - 23 (mg/dL)    Creatinine, Ser 7.82  0.50 - 1.35 (mg/dL)    Calcium 8.7  8.4 - 10.5 (mg/dL)    Total Protein 7.1  6.0 - 8.3 (g/dL)    Albumin 4.0  3.5 - 5.2 (g/dL)    AST 22  0 - 37 (U/L)    ALT 18  0 - 53 (U/L)    Alkaline Phosphatase 60  39 - 117 (U/L)    Total Bilirubin 0.3  0.3 - 1.2 (  mg/dL)    GFR calc non Af Amer >90  >90 (mL/min)    GFR calc Af Amer >90  >90 (mL/min)   ETHANOL     Status: Abnormal   Collection Time   07/03/11 10:10 PM      Component Value Range Comment   Alcohol, Ethyl (B) 251 (*) 0 - 11 (mg/dL)   URINE RAPID DRUG SCREEN (HOSP PERFORMED)     Status: Normal   Collection Time   07/03/11 10:31 PM      Component Value Range Comment   Opiates NONE DETECTED  NONE DETECTED     Cocaine NONE DETECTED  NONE DETECTED     Benzodiazepines NONE DETECTED  NONE DETECTED     Amphetamines NONE DETECTED  NONE DETECTED     Tetrahydrocannabinol NONE DETECTED  NONE DETECTED     Barbiturates NONE DETECTED  NONE DETECTED      No results found.  No anxiety, No psychosis and Positive for anxiety, bad mood, depression and excessive alcohol consumption Blood pressure 120/64, pulse 65, temperature 98.9 F (37.2 C), temperature source Oral, resp. rate 15, weight 205 lb (92.987 kg), SpO2 97.00%.   Assessment/Plan: Alcohol intoxication and the alcohol  dependence. Substance-induced mood disorder  Recommendation: Patient was recommended to be admitted to the acute psychiatric hospitalization for alcohol detox and possible rehabilitation services.   Trig Mcbryar,JANARDHAHA R. 07/04/2011, 4:30 PM

## 2011-07-04 NOTE — ED Notes (Signed)
Pt accepted to [facility] [id number]. By Dr. Catha Brow and report called to Vernona Rieger, RN. Pt in no distress. VSS. Escorted by Rodney Langton, and security.
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
## 2011-07-04 NOTE — Tx Team (Signed)
Initial Interdisciplinary Treatment Plan  PATIENT STRENGTHS: (choose at least two) Ability for insight Active sense of humor Average or above average intelligence Capable of independent living Communication skills General fund of knowledge Motivation for treatment/growth Physical Health Special hobby/interest Work skills  PATIENT STRESSORS: Financial difficulties Substance abuse   PROBLEM LIST: Problem List/Patient Goals Date to be addressed Date deferred Reason deferred Estimated date of resolution  "Is to get everything that I need to learn from here. Stay off the etoh and be a good father to my kid"     07/04/11     "Looking forward to going to long term treatment" 07/04/11           Alcohol Abuse 07/04/11     Increased risk for suicide 07/04/11     Depression  07/04/11                        DISCHARGE CRITERIA:  Ability to meet basic life and health needs Adequate post-discharge living arrangements Improved stabilization in mood, thinking, and/or behavior Medical problems require only outpatient monitoring Motivation to continue treatment in a less acute level of care Need for constant or close observation no longer present Reduction of life-threatening or endangering symptoms to within safe limits Safe-care adequate arrangements made Verbal commitment to aftercare and medication compliance Withdrawal symptoms are absent or subacute and managed without 24-hour nursing intervention  PRELIMINARY DISCHARGE PLAN: Attend aftercare/continuing care group Attend 12-step recovery group Outpatient therapy Participate in family therapy Placement in alternative living arrangements  PATIENT/FAMIILY INVOLVEMENT: This treatment plan has been presented to and reviewed with the patient, [name], and/or family member.  The patient and family have been given the opportunity to ask questions and make suggestions.  [name] [facility] 07/04/2011, 9:52 PM

## 2011-07-04 NOTE — Progress Notes (Signed)
Patient resting quietly with eyes closed. Respirations even and unlabored. No distress noted.

## 2011-07-05 DIAGNOSIS — F39 Unspecified mood [affective] disorder: Secondary | ICD-10-CM

## 2011-07-05 MED ORDER — CHLORDIAZEPOXIDE HCL 25 MG PO CAPS
25.0000 mg | ORAL_CAPSULE | Freq: Three times a day (TID) | ORAL | Status: AC
  Administered 2011-07-06 (×3): 25 mg via ORAL
  Filled 2011-07-05 (×3): qty 1

## 2011-07-05 MED ORDER — CHLORDIAZEPOXIDE HCL 25 MG PO CAPS
25.0000 mg | ORAL_CAPSULE | ORAL | Status: AC
  Administered 2011-07-07 (×2): 25 mg via ORAL
  Filled 2011-07-05 (×2): qty 1

## 2011-07-05 MED ORDER — ONDANSETRON 4 MG PO TBDP: 4.0000 mg | ORAL_TABLET | Freq: Four times a day (QID) | ORAL | Status: DC | PRN

## 2011-07-05 MED ORDER — ADULT MULTIVITAMIN W/MINERALS CH
1.0000 | ORAL_TABLET | Freq: Every day | ORAL | Status: DC
  Administered 2011-07-05 – 2011-07-12 (×8): 1 via ORAL
  Filled 2011-07-05 (×10): qty 1

## 2011-07-05 MED ORDER — CHLORDIAZEPOXIDE HCL 25 MG PO CAPS
25.0000 mg | ORAL_CAPSULE | Freq: Every day | ORAL | Status: AC
  Administered 2011-07-08: 25 mg via ORAL
  Filled 2011-07-05: qty 1

## 2011-07-05 MED ORDER — CHLORDIAZEPOXIDE HCL 25 MG PO CAPS
25.0000 mg | ORAL_CAPSULE | Freq: Four times a day (QID) | ORAL | Status: AC
  Administered 2011-07-05 (×3): 25 mg via ORAL
  Filled 2011-07-05 (×3): qty 1

## 2011-07-05 MED ORDER — VITAMIN B-1 100 MG PO TABS
100.0000 mg | ORAL_TABLET | Freq: Every day | ORAL | Status: DC
  Administered 2011-07-06 – 2011-07-12 (×7): 100 mg via ORAL
  Filled 2011-07-05 (×9): qty 1

## 2011-07-05 MED ORDER — THIAMINE HCL 100 MG/ML IJ SOLN: 100.0000 mg | Freq: Once | INTRAMUSCULAR | Status: DC

## 2011-07-05 MED ORDER — CHLORDIAZEPOXIDE HCL 25 MG PO CAPS: 25.0000 mg | ORAL_CAPSULE | Freq: Four times a day (QID) | ORAL | Status: AC | PRN

## 2011-07-05 MED ORDER — LOPERAMIDE HCL 2 MG PO CAPS: 2.0000 mg | ORAL_CAPSULE | ORAL | Status: AC | PRN

## 2011-07-05 MED ORDER — HYDROXYZINE HCL 25 MG PO TABS: 25.0000 mg | ORAL_TABLET | Freq: Four times a day (QID) | ORAL | Status: AC | PRN

## 2011-07-05 MED ORDER — CITALOPRAM HYDROBROMIDE 20 MG PO TABS
20.0000 mg | ORAL_TABLET | Freq: Every day | ORAL | Status: DC
  Administered 2011-07-05 – 2011-07-08 (×4): 20 mg via ORAL
  Filled 2011-07-05 (×5): qty 1

## 2011-07-05 NOTE — Discharge Planning (Signed)
Returning patient presents in usual gruff, agitated manner.  Admits to relapsing recently.  Is afraid of losing fiance and son due to his drinking.  Requests referral to [facility].  Called Don at [facility] who gave me a screening date of Thursday.  Kyree is agreeable with this.  He also asked that we call his boss together.  During that conversation, it was revealed that Jonny Ruiz had just recently released from [facility], and that Devron does not find AA mtgs all that helpful.  At this point, since the rehab facilities we refer to stress the 12 steps and consistent follow up s/p d/c, it would appear that we have linked Tajon with all supports from which he can benefit.  Any further hospitalizations for detox will most likely result in referral to half way houses only.

## 2011-07-05 NOTE — Progress Notes (Signed)
07/05/2011         Time: 1415      Group Topic/Focus: The focus of this group is on enhancing the patient's understanding of leisure, barriers to leisure, and the importance of engaging in positive leisure activities upon discharge for improved total health.   Participation Level: Active  Participation Quality: Appropriate and Attentive  Affect: Appropriate  Cognitive: Oriented   Additional Comments: Patient able to identify positive leisure interests he can engage in upon discharge.    [name] 07/05/2011 3:46 PM

## 2011-07-05 NOTE — Progress Notes (Signed)
[facility] Group Notes:  (Counselor/Nursing/MHT/Case Management/Adjunct)  07/06/2011 3:42 PM  Type of Therapy:  Group Therapy at 11 on 07/05/11  Participation Level:  Active  Participation Quality:  Appropriate and Sharing  Affect:  Angry and Excited  Cognitive:  Alert and Oriented  Insight:  Limited  Engagement in Group:  Good  Engagement in Therapy:  Limited  Modes of Intervention:  Limit-setting, Socialization and Support  Summary of Progress/Problems: Hillard was just admitted to detox unit  yet willingly came to group.Focus of group discussion today was feelings related to one's diagnosis of substance abuse, anxiety, depression, and or other mental health issues. Much discussion centered on disease concept verses moral issue. Barbara expressed anger related to being here once again as "I've been this round some 15 times; I do not know what more I have to do to get to my bottom but I hope this is it."  Patient was somewhat surprised when asked to consider the possibility that this was his bottom.  [name] 07/06/2011, 3:43 PM
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
## 2011-07-05 NOTE — Progress Notes (Signed)
Brief Nutrition Note  Reason: Patient screened at nutrition risk for unintentional weight loss > 10 lb over 1 month.  Patient reported good appetite and intake. He denies any recent weight loss. He reported UBW of 181 lb.Marland Kitchen Spoke with RN, patient eating well.   Height: 67" Weight: 181 lb.Marland Kitchen BMI: 28.3 kg/m^2 (Over weight)  Patient without unintentional weight loss. Patient not at nutrition risk at this time.   RD available for nutrition  Needs.   Iven Finn [facility] [phone]

## 2011-07-05 NOTE — Progress Notes (Signed)
[facility] [place] Case Management Discharge Plan:  Will you be returning to the same living situation after discharge: No. At discharge, do you have transportation home?:Yes,  bus to get to [facility] Do you have the ability to pay for your medications:Yes,  $4.00 [employer] formulary  Interagency Information:     Release of information consent forms completed and in the chart;  Patient's signature needed at discharge.  Patient to Follow up at:  Follow-up Information    Follow up with [facility] on 07/07/2011. (8 AM)    Contact information:   [address]  [place]  [phone]         Patient denies SI/HI:   Yes,  yes    Safety Planning and Suicide Prevention discussed:  Yes,  yes  Barrier to discharge identified:No.  Summary and Recommendations:   [name] 07/05/2011, 2:54 PM

## 2011-07-05 NOTE — Progress Notes (Signed)
Adult Psychosocial Assessment Update Interdisciplinary Team  Previous [facility] admissions/discharges:  Admissions Discharges  Date:  01-01-11 Date: 01-08-11  Date: Date:  Date: Date:  Date: Date:  Date: Date:   Changes since the last Psychosocial Assessment (including adherence to outpatient mental health and/or substance abuse treatment, situational issues contributing to decompensation and/or relapse).   Patient reports he has continued to live with "group of friends" since discharging here     In December 2012 and is still employed by [employer] after 15 years.  He also    Reports not going to [place] in December of last year nor attending meetings.  Patient    Currently drinking 9-11 40 ounce beers daily and requesting detox.  "My daughter (33    years old) was so happy when I told her I wanted to quit drinking.     Discharge Plan 1. Will you be returning to the same living situation after discharge?   Yes: X No:      If no, what is your plan?      After treatment       2. Would you like a referral for services when you are discharged? Yes:   X  If yes, for what services?  No:         [facility] where patient reports case management has already planned for him     To discharge to on Thursday     Summary and Recommendations (to be completed by the evaluator)   Patient is 46 YO single employed caucasian [place] male admitted with diagnosis of   Major Depressive disorder, recurrent and moderate.  Patient reported to [facility] that he     Wished to go to hospital for detox recently after consuming 9-11 40 ounce beers for    last several months.  Patient will benefit from crisis stabilization, medication evaluation,    Group therapy and psycho education in addition to case management for discharge     Planning.                Signature:  [name], 07/05/2011 6:41 PM

## 2011-07-05 NOTE — Progress Notes (Signed)
Pt has been up for most groups.  He has been back to his bed between groups but will get once he is told it is time for group.  He rated his depression and hopelessness a 9 and his anxiety an 8 on his self-inventory.  He was started on librium protocol this afternoon and denied any symptoms thus far.  He has required no prn meds thus far.

## 2011-07-06 DIAGNOSIS — F1020 Alcohol dependence, uncomplicated: Secondary | ICD-10-CM

## 2011-07-06 NOTE — Progress Notes (Signed)
[facility] Group Notes:  (Counselor/Nursing/MHT/Case Management/Adjunct)  07/06/2011 4:28 PM  Type of Therapy:  Group Therapy at 11:00  Participation Level:  Active  Participation Quality:  Attentive and Sharing  Affect:  Irritable  Cognitive:  Alert and Oriented  Insight:  Limited  Engagement in Group:  Good  Engagement in Therapy:  Limited  Modes of Intervention:  Limit-setting, Support and Redirection.   Summary of Progress/Problems:  The focus of this group session was to process how we deal with difficult emotions and share with others the patterns that play out when we are reacting to the emotion verses the situation.  [name] shared that one of the most difficult emotions he deals with is anger during any type of conflict or conflictual situation.  "People know exactly how to push my buttons and I look for a way to push back and I just get more worked up even thinking about what happens."  Patient agreed that often times he works against his own best interests and agreed to role play with Clinical research associate and shared how 'strange' it felt to see someone not react in his role.      [name] 07/06/2011, 4:28 PM  [facility] Group Notes:  (Counselor/Nursing/MHT/Case Management/Adjunct)  07/06/2011 4:34 PM  Type of Therapy:  Group Therapy at 1:15  Participation Level:  None  Participation Quality:  Very drowsy, nodded of at multiple points. :   [name] 07/06/2011, 4:34 PM

## 2011-07-06 NOTE — Progress Notes (Signed)
Lying quietly in bed with eyes closed.  Safety checks conducted Q 15 minutes. 

## 2011-07-06 NOTE — Progress Notes (Signed)
[facility] Adult [facility] Family/Significant Other Suicide Prevention Education  Suicide Prevention Education:  Patient Refusal for Family/Significant Other Suicide Prevention Education: The patient [name] has refused to provide written consent for family/significant other to be provided Family/Significant Other Suicide Prevention Education during admission and/or prior to discharge.  Physician notified.  Writer provided suicide prevention education directly to patient; conversation included risk factors, warning signs and resources to contact for help. Mobile crisis services explained and contact card placed in chart for pt to receive at discharge.   [name] 07/06/2011, 3:47 PM

## 2011-07-06 NOTE — Progress Notes (Addendum)
Pt denies SI/HI/AVH. Pt rates his depression 8, hopelessness 6, and anxiety 7. Pt visible on unit and attended groups. Pt mood anxious and depressed. Pt appropriate. Interaction assertive. Support and encouragement offered. Pt receptive.

## 2011-07-06 NOTE — Progress Notes (Signed)
[facility] MD Progress Note  07/06/2011 10:59 AM  Diagnosis:   Axis I: Alcohol Abuse and Substance Induced Mood Disorder Axis II: Mental retardation, severity unknown Axis III:  Past Medical History  Diagnosis Date  . Hypertension   . Alcoholism /alcohol abuse     ADL's:  Intact  Sleep: Fair  Appetite:  Good  Suicidal Ideation:  Patient denies thought, plan, or intent Homicidal Ideation:  Patient denies thought, plan, or intent  Subjective: [name] reports that he has been feeling lightheaded for 2 days. He reports that his detox is "pretty good," but later expresses that he is still in withdrawal and does not feel ready to be discharged. Initially he stated that he no longer wishes to be discharged to [facility] residential recovery facility, but wants to go stay with and care for his 46 year old grandmother here in [place]. 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 He reports that his boss, Dallie Piles, will pick him up in the morning. He denies any suicidal or homicidal ideation. He denies any auditory or or visual hallucinations. He reports that his sleep is "average" and that it is good some nights and poor other nights. He does endorse some tremors, as well as anxiety. He states "I'm not ready to leave," and expresses a desire to be further medically detoxed.  AEB (as evidenced by):  Mental Status Examination/Evaluation: Objective:  Appearance: Casual and Fairly Groomed  Eye Contact::  Good  Speech:  Clear and Coherent  Volume:  Increased  Mood:  Anxious, Depressed and Irritable  Affect:  Labile, Full Range and Tearful  Thought Process:  Circumstantial and Disorganized  Orientation:  Full  Thought Content:  WDL  Suicidal Thoughts:  No  Homicidal Thoughts:  No  Memory:  Immediate;   Good  Judgement:  Poor  Insight:  Lacking  Psychomotor Activity:  Restlessness and Tremor  Concentration:  Good  Recall:   Good  Akathisia:  No  Handed:    AIMS (if indicated):     Assets:  Desire for Improvement Physical Health  Sleep:  Number of Hours: 6.75    Vital Signs:Blood pressure 106/66, pulse 42, temperature 98 F (36.7 C), temperature source Oral, resp. rate 16, height 5\' 7"  (1.702 m), weight 82.101 kg (181 lb). Current Medications: Current Facility-Administered Medications  Medication Dose Route Frequency Provider Last Rate Last Dose  . acetaminophen (TYLENOL) tablet 650 mg  650 mg Oral Q6H PRN Nehemiah Settle, MD      . alum & mag hydroxide-simeth (MAALOX/MYLANTA) 200-200-20 MG/5ML suspension 30 mL  30 mL Oral Q4H PRN Nehemiah Settle, MD      . chlordiazePOXIDE (LIBRIUM) capsule 25 mg  25 mg Oral Q6H PRN Alyson Kuroski-Mazzei, DO      . chlordiazePOXIDE (LIBRIUM) capsule 25 mg  25 mg Oral QID Alyson Kuroski-Mazzei, DO   25 mg at 07/05/11 2202   Followed by  . chlordiazePOXIDE (LIBRIUM) capsule 25 mg  25 mg Oral TID Alyson Kuroski-Mazzei, DO   25 mg at 07/06/11 0813   Followed by  . chlordiazePOXIDE (LIBRIUM) capsule 25 mg  25 mg Oral BH-qamhs Alyson Kuroski-Mazzei, DO       Followed by  . chlordiazePOXIDE (LIBRIUM) capsule 25 mg  25 mg Oral Daily Alyson Kuroski-Mazzei, DO      . citalopram (CELEXA) tablet 20 mg  20 mg Oral Daily Alyson Kuroski-Mazzei, DO   20 mg at 07/06/11 0813  .  hydrOXYzine (ATARAX/VISTARIL) tablet 25 mg  25 mg Oral Q6H PRN Alyson Kuroski-Mazzei, DO      . loperamide (IMODIUM) capsule 2-4 mg  2-4 mg Oral PRN Alyson Kuroski-Mazzei, DO      . magnesium hydroxide (MILK OF MAGNESIA) suspension 30 mL  30 mL Oral Daily PRN Nehemiah Settle, MD      . mulitivitamin with minerals tablet 1 tablet  1 tablet Oral Daily Alyson Kuroski-Mazzei, DO   1 tablet at 07/06/11 0813  . ondansetron (ZOFRAN-ODT) disintegrating tablet 4 mg  4 mg Oral Q6H PRN Nehemiah Settle, MD   4 mg at 07/04/11 2306  . thiamine (B-1) injection 100 mg  100 mg Intramuscular Once  [name], DO      . thiamine (VITAMIN B-1) tablet 100 mg  100 mg Oral Daily Alyson Kuroski-Mazzei, DO   100 mg at 07/06/11 0813  . traZODone (DESYREL) tablet 100 mg  100 mg Oral QHS Nehemiah Settle, MD   100 mg at 07/05/11 2202  . DISCONTD: chlordiazePOXIDE (LIBRIUM) capsule 25 mg  25 mg Oral Q6H PRN Nehemiah Settle, MD   25 mg at 07/04/11 2308  . DISCONTD: hydrOXYzine (ATARAX/VISTARIL) tablet 25 mg  25 mg Oral Q6H PRN Nehemiah Settle, MD   25 mg at 07/04/11 2307  . DISCONTD: loperamide (IMODIUM) capsule 2-4 mg  2-4 mg Oral PRN Nehemiah Settle, MD      . DISCONTD: mulitivitamin with minerals tablet 1 tablet  1 tablet Oral Daily Nehemiah Settle, MD   1 tablet at 07/05/11 0831  . DISCONTD: nicotine (NICODERM CQ - dosed in mg/24 hours) patch 21 mg  21 mg Transdermal Q0600 Nehemiah Settle, MD      . DISCONTD: ondansetron (ZOFRAN-ODT) disintegrating tablet 4 mg  4 mg Oral Q6H PRN Alyson Kuroski-Mazzei, DO      . DISCONTD: thiamine (VITAMIN B-1) tablet 100 mg  100 mg Oral Daily Nehemiah Settle, MD   100 mg at 07/05/11 0831    Lab Results: No results found for this or any previous visit (from the past 48 hour(s)).  Physical Findings: AIMS:  , ,  ,  ,    CIWA:  CIWA-Ar Total: 0  COWS:     Treatment Plan Summary: Daily contact with patient to assess and evaluate symptoms and progress in treatment Medication management  Plan: We will not discharge [name] to [facility] tomorrow as previously planned, but will continue his medical detox and further research treatment options. [name] 07/06/2011, 10:59 AM

## 2011-07-06 NOTE — Discharge Planning (Signed)
Checked in with patient after seeing PA note.  Lavern confirmed that in fact he is not ready to go to [facility] tomorrow.  Says he can stay with his grandmother until he can get a bed at [facility], and will get a ride from his boss.  Called Don who gave me a new screening date of 6/11.

## 2011-07-07 MED ORDER — DIVALPROEX SODIUM ER 500 MG PO TB24
500.0000 mg | ORAL_TABLET | Freq: Every day | ORAL | Status: DC
  Administered 2011-07-07 – 2011-07-08 (×2): 500 mg via ORAL
  Filled 2011-07-07 (×4): qty 1

## 2011-07-07 NOTE — Progress Notes (Signed)
Recreation Therapy Notes  07/07/2011         Time: 1415      Group Topic/Focus: The focus of the group is on enhancing the patients' ability to cope with stressors by understanding what coping is, why it is important, the negative effects of stress and developing healthier coping skills. Patients asked to complete a fifteen minute plan, outlining three triggers, three supports, and fifteen coping activities.  Participation Level: Active  Participation Quality: Appropriate and Attentive  Affect: Appropriate  Cognitive: Oriented   Additional Comments: Patient reports craving risky behaviors, group discussed more healthy, calculated risks he could take like paint ball, bungee jumping, etc.    Ramani Riva 07/07/2011 3:20 PM

## 2011-07-07 NOTE — Progress Notes (Signed)
Patient appeared to be doing better and interacting well with peers. He attended karaoke and reported that he sand there. He denied withdrawal symptoms. Mood and affects appropriate. Q 15 minute check continues to maintain safety.

## 2011-07-07 NOTE — Progress Notes (Signed)
[facility] Group Notes:  (Counselor/Nursing/MHT/Case Management/Adjunct)  07/07/2011 3:48 PM  Type of Therapy:  Group Therapy  Participation Level:  Active  Participation Quality:  Attentive, Monopolizing and Sharing  Affect:  Irritable  Cognitive:  Alert and Oriented  Insight:  Limited  Engagement in Group:  Good  Engagement in Therapy:  Limited  Modes of Intervention:  Limit-setting and Support  Summary of Progress/Problems:   Focus of group discussion was balance in life;  [name] shared that work and money are necessities he truly needs.  [name] became somewhat argumentative when others in group shared about needing companionship or supportive relationships.  [name] was responsive to redirection when he became irritated speaking about his boss and ex wife.    [facility] Group Notes:  (Counselor/Nursing/MHT/Case Management/Adjunct)  07/07/2011 4:04 PM  Type of Therapy:  Group Therapy  Participation Level:  Active  Participation Quality:  Attentive and Sharing  Affect:  Depressed yet calmer  Cognitive:  Alert and Oriented  Insight:  Improved  Engagement in Group:  Good  Engagement in Therapy:  Good  Modes of Intervention:  Clarification and Support  Summary of Progress/Problems:  [name] participated in activity in which patients choose photographs to represent what their life would look and feel like were it in balance and another for out of balance. [name] chose a photograph of an older couple seen walking hand and hand and shared that this is what balance would look like.  "And I threw all this away for alcohol just because of this trigger (Pt held up photo of a dollar bill).  Money is the root of all my evil.  Of course plenty of people could sy that but I mean it and you folks know I do."   [name] 07/07/2011, 4:04 PM

## 2011-07-07 NOTE — Progress Notes (Signed)
Patient up and in the milieu.  Has been attending groups and interacting with staff and peers.  Tolerating medications well.  Started on Depakote today.  Appetite good.

## 2011-07-07 NOTE — Discharge Planning (Signed)
Today in AM group [name] was angry at boss and sad a tearful about his shame about relapse.  Unable to think in abstract terms about his situation and relationship with others. C/O night sweats and poor sleep.  Plan remains for him to d/c to grandmother when done with taper, and go to [facility] on tues.

## 2011-07-07 NOTE — Progress Notes (Signed)
Resting quietly with eyes closed. Respirations even and unlabored. No distress noted.  

## 2011-07-07 NOTE — Progress Notes (Signed)
[facility] MD Progress Note  07/07/2011 1:46 PM  S/O: Patient seen and evaluated. Chart reviewed. Patient stated that his mood was "not good". His affect was mood congruent and anxious. He denied any current thoughts of self injurious behavior, suicidal ideation or homicidal ideation. There were no auditory or visual hallucinations, paranoia, delusional thought processes, or mania noted. Thought process was linear and goal directed. No psychomotor agitation or retardation was noted. His speech was normal rate, tone and increased volume. Eye contact was good. Judgment and insight are fair. Patient has been up and engaged on the unit in group. No acute safety concerns reported from team.  Pt has demonstrated sig mood lability and decompensation on the unit since yesterday.  Willing to start mood stabilizer at this time.   Sleep:  Number of Hours: 6.5    Vital Signs:Blood pressure 149/87, pulse 67, temperature 97.6 F (36.4 C), temperature source Oral, resp. rate 16, height 5\' 7"  (1.702 m), weight 82.101 kg (181 lb).  Lab Results: No results found for this or any previous visit (from the past 48 hour(s)).  CLINICAL FACTORS: Alcohol Dependence & W/D; Mood Disorder NOS; HTN, per Hx (no current meds); r/o GAd and BIF/mild MR/LD   Plan: Medication education completed.  Pros, cons, risks, potential side effects and benefits (including no treatment) were discussed with pt.  Rec initiation of Depakote 500mg  po qd for improved mood stability.  Pt agreeable with the plan.  See orders.  Discussed with team.  Pt agreeable to also go to [facility] next Tuesday.  [name] 07/07/2011, 1:46 PM

## 2011-07-08 MED ORDER — CITALOPRAM HYDROBROMIDE 20 MG PO TABS
30.0000 mg | ORAL_TABLET | Freq: Every day | ORAL | Status: DC
  Administered 2011-07-09 – 2011-07-12 (×4): 30 mg via ORAL
  Filled 2011-07-08 (×6): qty 1

## 2011-07-08 MED ORDER — DIVALPROEX SODIUM ER 500 MG PO TB24
750.0000 mg | ORAL_TABLET | Freq: Every day | ORAL | Status: DC
  Administered 2011-07-09 – 2011-07-12 (×4): 750 mg via ORAL
  Filled 2011-07-08 (×6): qty 1

## 2011-07-08 NOTE — Discharge Planning (Signed)
Today [name] is saying his plan is to stay here until he can get into [facility] on Tues.  I informed him that we will likely have him stabilized before then, and re-confirmed that he can stay with grandmother until he goes to [facility] on Tues.  Last authorized day is Saturday.  D/C Sunday if stable.  I put a bus ticket in chart in case he needs it.

## 2011-07-08 NOTE — Progress Notes (Signed)
[facility] Group Notes:  (Counselor/Nursing/MHT/Case Management/Adjunct)  07/08/2011 5:46 PM  Type of Therapy:  Group Therapy at 11  Participation Level:  Minimal  Participation Quality:  Attentive  Affect:  Irritable  Cognitive:  Oriented  Insight:  Limited  Engagement in Group:  Limited  Engagement in Therapy:  Limited  Modes of Intervention:  Education and Socialization  Summary of Progress/Problems: [name] was somewhat attentive to discussion on Post Acute Withdrawal Syndrome (PAWS) although visibly irritated at times .    [name] 07/08/2011, 5:46 PM  [facility] Group Notes:  (Counselor/Nursing/MHT/Case Management/Adjunct)  07/08/2011 5:49 PM  Type of Therapy:  Group Therapy  Participation Level:  Active  Participation Quality:  Attentive and Sharing  Affect:  Appropriate  Cognitive:  Alert and Oriented  Insight:  Improved  Engagement in Group:  Good  Engagement in Therapy:  Good  Modes of Intervention:  Clarification, Socialization and Support  Summary of Progress/Problems:  [name] shared how he believes he has 'hit rock bottom' this time.  "I had to tell my little girl (age 74) that daddy was going to the hospital to get some help. She was ultimately glad that Daddy is getting some help but it hurt to see her face. Internal doctor told me that the blood and passing is related to liver damage; I just don't think I can live like this anymore." Others in group offered support.   [name] 07/08/2011, 5:49 PM

## 2011-07-08 NOTE — Progress Notes (Signed)
[facility] MD Progress Note  07/08/2011 1:41 PM  S/O: Patient seen and evaluated. Chart reviewed. Patient stated that his mood was "not good". His affect was mood congruent and still quite anxious. He denied any current thoughts of self injurious behavior, suicidal ideation or homicidal ideation. There were no auditory or visual hallucinations, paranoia, delusional thought processes, or mania noted. Thought process was linear and goal directed. Sig psychomotor agitation noted. His speech was increased rate, tone and volume. Eye contact was good. Judgment and insight are fair. Patient has been up and engaged on the unit and in group. No acute safety concerns reported from team.  Pt has demonstrated sig mood lability and decompensation on the unit.  Willing to adjust meds further today.   Sleep:  Number of Hours: 5.75  (wrong amount entered first)   Vital Signs:Blood pressure 127/75, pulse 76, temperature 96.8 F (36 C), temperature source Oral, resp. rate 24, height 5\' 7"  (1.702 m), weight 82.101 kg (181 lb).  Lab Results: No results found for this or any previous visit (from the past 48 hour(s)).  CLINICAL FACTORS: Alcohol Dependence & W/D; Mood Disorder NOS; r/o GAD and BIF/mild MR/LD   Plan: Medication education completed.  Pros, cons, risks, potential side effects and benefits (including no treatment) were discussed with pt.  Rec increase of Depakote from 500mg  to 750mg  po qd for improved mood stability and increased citalopram to 30mg  qd.  No SEs reported.  Pt agreeable with the plan.  See orders.  Discussed with team.  Pt agreeable to also go to [facility] next Tuesday. Potential discharge to [facility] over wkend if pt stabilizes.  [name] 07/08/2011, 1:41 PM

## 2011-07-08 NOTE — Discharge Planning (Signed)
This morning in group [name] laid out a plan that had his boss coming here to pick him up Tues to take him to [facility].  I let Trek know that likely was not realistic as the plan is to d/c sometime over the weekend.  He will see Dr today to find out more specifics.

## 2011-07-08 NOTE — Progress Notes (Signed)
Slept fair last nite, appetite is improving, energy level is low and ability to pay attention is poor, depressed 6/10 and hopeless 7/10, passive SI w/contract for safety, attending group and interacting w/peers, taking meds as ordered by MD q20min safety checks continue and support offered Safety maintained

## 2011-07-08 NOTE — Progress Notes (Signed)
Patient reported feeling exhausted during this assessment. He said he was feeling anxious and worried about his life, his mood and affect sad and depressed, also tearful. Patient stated ; "I don't just feel happy, I'm trying to get over this addiction, can't just do it". He said that the doctor said she would make changes to his medications today. Pt denied SI/HI and endorsed tremor and anxiety, patient not on PRN  Librium or anti anxiety medications at this time. Patient encouraged and supported. Q 15 minute check continues to maintain safety.

## 2011-07-09 MED ORDER — HYDROXYZINE HCL 25 MG PO TABS
25.0000 mg | ORAL_TABLET | Freq: Three times a day (TID) | ORAL | Status: DC | PRN
  Administered 2011-07-09 – 2011-07-11 (×4): 25 mg via ORAL

## 2011-07-09 NOTE — Progress Notes (Signed)
Patient ID: Ezri Landers, male   DOB: 1965-04-27, 46 y.o.   MRN: [id number]   [facility] Group Notes:  (Counselor/Nursing/MHT/Case Management/Adjunct)  07/09/2011 1:15 PM  Type of Therapy:  Group Therapy, Dance/Movement Therapy   Participation Level:  Active  Participation Quality:  Appropriate  Affect:  Appropriate  Cognitive:  Appropriate  Insight:  Good  Engagement in Group:  Good  Engagement in Therapy:  Good  Modes of Intervention:  Clarification, Problem-solving, Role-play, Socialization and Support  Summary of Progress/Problems: Therapist began group by asking patients to share one positive thing they can get out of today. Therapist then discussed self-sabotage and invited patients to share justifications that they tell themselves when they feel the desire to drink and use drugs. Pt. shared that he is gaining knowledge while being in treatment and receiving the proper medication. Pt. shared a personal story about driving a house while being under the influence and was able to process that decision and the consequences.     Cassidi Long

## 2011-07-09 NOTE — Progress Notes (Signed)
Patient ID: [name], male   DOB: Jun 07, 1965, 46 y.o.   MRN: [id number]  Pt. attended and participated in aftercare planning group. Pt. accepted information on suicide prevention, warning signs to look for with suicide and crisis line numbers to use. Pt. listed their current anxiety level as 8 and current depression level as 7. Pt. Shared that the doctor is monitoring his medication on a day to day basis to see their effects. Pt. is not currently having any SI but is anxious about responsibilities back home including his daughter and his job. Pt. is 3 weeks out of [employer] and shared that he may lose his job if he can not be consistent with treatment/recovery.

## 2011-07-09 NOTE — Progress Notes (Signed)
Pt pleasant and appropriate on approach, positive for evening group.  No complaints voiced.  Denies SI/HI/hallucinations at this time.  Support and encouragement offered, will continue to monitor.

## 2011-07-09 NOTE — Progress Notes (Signed)
Patient ID: [name], male   DOB: [birth year], 46 y.o.   MRN: [id number] Pt is awake and active on the unit this AM. Pt endorses passive SI but is able to contract for safety. Pt denies A/V hallucinations. Pt is participating in the milieu and is cooperative with staff. Pt c/o anxiety. PA notified. Pt has a hx of recovery attempts with AA and pt states that he intends to return after discharge. Writer will continue to monitor.

## 2011-07-09 NOTE — Progress Notes (Signed)
[facility] MD Progress Note  07/09/2011 11:07 AM  Diagnosis:   Axis I: Alcohol Abuse, Major Depression, Recurrent severe and Substance Induced Mood Disorder Axis II: Deferred Axis III:  Past Medical History  Diagnosis Date  . Hypertension   . Alcoholism /alcohol abuse    Subjective: [name] states that he feels "a little bit better" today. He feels that the medication changes are helpful. He continues to endorse a significant amount of depression and anxiety. He denies any suicidal or homicidal ideation today. He denies any auditory or visual hallucinations. He denies any cravings for alcohol but endorses some tremors. His sleep is fair.He reports that he vomits frequently after her eating.  ADL'  s:  Intact  Sleep: Fair  Appetite:  Fair  Suicidal Ideation:  Patient denies any thoughts, plan, or intent.  Homicidal Ideation:  Patient denies any thoughts, plan, or intent.   AEB (as evidenced by):  Mental Status Examination/Evaluation: Objective:  Appearance: Disheveled  Eye Contact::  Good  Speech:  Clear and Coherent  Volume:  Normal  Mood:  Depressed  Affect:  Blunt and Tearful  Thought Process:  Goal Directed and Logical  Orientation:  Full  Thought Content:  WDL  Suicidal Thoughts:  No  Homicidal Thoughts:  No  Memory:  Immediate;   Good  Judgement:  Fair  Insight:  Fair  Psychomotor Activity:  Psychomotor Retardation  Concentration:  Good  Recall:  Good  Akathisia:  No  Handed:    AIMS (if indicated):     Assets:  Desire for Improvement  Sleep:  Number of Hours: 6.75    Vital Signs:Blood pressure 112/64, pulse 71, temperature 98.1 F (36.7 C), temperature source Oral, resp. rate 16, height 5\' 7"  (1.702 m), weight 82.101 kg (181 lb). Current Medications: Current Facility-Administered Medications  Medication Dose Route Frequency Provider Last Rate Last Dose  . acetaminophen (TYLENOL) tablet 650 mg  650 mg Oral Q6H PRN Nehemiah Settle, MD   650 mg at 07/09/11  0840  . alum & mag hydroxide-simeth (MAALOX/MYLANTA) 200-200-20 MG/5ML suspension 30 mL  30 mL Oral Q4H PRN Nehemiah Settle, MD      . chlordiazePOXIDE (LIBRIUM) capsule 25 mg  25 mg Oral Q6H PRN Alyson Kuroski-Mazzei, DO      . citalopram (CELEXA) tablet 30 mg  30 mg Oral Daily Alyson Kuroski-Mazzei, DO   30 mg at 07/09/11 1610  . divalproex (DEPAKOTE ER) 24 hr tablet 750 mg  750 mg Oral Daily Alyson Kuroski-Mazzei, DO   750 mg at 07/09/11 9604  . hydrOXYzine (ATARAX/VISTARIL) tablet 25 mg  25 mg Oral Q6H PRN Alyson Kuroski-Mazzei, DO      . loperamide (IMODIUM) capsule 2-4 mg  2-4 mg Oral PRN Alyson Kuroski-Mazzei, DO      . magnesium hydroxide (MILK OF MAGNESIA) suspension 30 mL  30 mL Oral Daily PRN Nehemiah Settle, MD      . mulitivitamin with minerals tablet 1 tablet  1 tablet Oral Daily Alyson Kuroski-Mazzei, DO   1 tablet at 07/09/11 0839  . thiamine (B-1) injection 100 mg  100 mg Intramuscular Once [name], DO      . thiamine (VITAMIN B-1) tablet 100 mg  100 mg Oral Daily Alyson Kuroski-Mazzei, DO   100 mg at 07/09/11 0839  . traZODone (DESYREL) tablet 100 mg  100 mg Oral QHS Nehemiah Settle, MD   100 mg at 07/08/11 2121  . DISCONTD: citalopram (CELEXA) tablet 20 mg  20 mg Oral  Daily Alyson Kuroski-Mazzei, DO   20 mg at 07/08/11 0758  . DISCONTD: divalproex (DEPAKOTE ER) 24 hr tablet 500 mg  500 mg Oral Daily Alyson Kuroski-Mazzei, DO   500 mg at 07/08/11 1610    Lab Results: No results found for this or any previous visit (from the past 48 hour(s)).  Physical Findings: AIMS:  , ,  ,  ,    CIWA:  CIWA-Ar Total: 0  COWS:     Treatment Plan Summary: Daily contact with patient to assess and evaluate symptoms and progress in treatment Medication management  Plan: We will continue his current plan of care and monitor him closely. We will make the appropriate followup plans   [name] 07/09/2011, 11:07 AM

## 2011-07-09 NOTE — Progress Notes (Signed)
[facility] Group Notes:  (Counselor/Nursing/MHT/Case Management/Adjunct)  07/09/2011 1:15 PM  Type of Therapy:  Group Therapy, Dance/Movement Therapy   Participation Level:  Active  Participation Quality:  Attentive and Sharing  Affect:  Anxious  Cognitive:  Appropriate  Insight:  Limited  Engagement in Group:  Limited  Engagement in Therapy:  Limited  Modes of Intervention:  Clarification, Problem-solving, Role-play, Socialization and Support  Summary of Progress/Problems:  [facility] Group Notes:  (Counselor/Nursing/MHT/Case Management/Adjunct)  07/09/2011 1:15 PM  Type of Therapy:  Group Therapy, Dance/Movement Therapy   Participation Level:  Minimal  Participation Quality:  Attentive, Sharing and Supportive  Affect:  Appropriate  Cognitive:  Appropriate  Insight:  Limited  Engagement in Group:  Limited  Engagement in Therapy:  Limited  Modes of Intervention:  Clarification, Problem-solving, Role-play, Socialization and Support  Summary of Progress/Problems: Pt participated in a group discussion where excuses and "sabotaging statements" where shared so as to identify them as such and to bring awareness to the unhealthy statements that are said that help justify use. Pt spoke about how to make changes in their lives so as to change these statements and have support in recovery. Pt spoke specifically about getting positive things from the group and knowing that he needs to be here in treatment today.    [name]

## 2011-07-10 MED ORDER — ARIPIPRAZOLE 2 MG PO TABS
2.0000 mg | ORAL_TABLET | Freq: Every day | ORAL | Status: DC
  Administered 2011-07-10 – 2011-07-11 (×2): 2 mg via ORAL
  Filled 2011-07-10 (×4): qty 1

## 2011-07-10 NOTE — Progress Notes (Signed)
[facility] MD Progress Note  07/10/2011 9:37 AM  Diagnosis:   Axis I: Alcohol Abuse, Major Depression, Recurrent severe and Substance Induced Mood Disorder Axis II: Deferred Axis III:  Past Medical History  Diagnosis Date  . Hypertension   . Alcoholism /alcohol abuse    Subjective: [name] continues to endorse severe depression with suicidal thoughts. He does not have a clear plan, but feels certain that if he were to leave the hospital he would try to hurt himself. He also endorses having seen a visual hallucination of his mother in a white robe yesterday. He reports that his sleep was fair last night, and feels that he got about 5 hours total. His appetite continues to be fair. He also endorses cravings for alcohol, although he denies any withdrawal symptoms. He expresses a significant amount of guilt and shame regarding how his behaviors have affected his family. He feels as if he is stuck, and has been unable to let go of this pattern of thought.  ADL's:  Intact  Sleep: Fair  Appetite:  Fair  Suicidal Ideation:  [name] endorses thoughts, and intent, but has no clear plan. Homicidal Ideation:  Denies thoughts, plan, or intent  AEB (as evidenced by):  Mental Status Examination/Evaluation: Objective:  Appearance: Casual and Well Groomed  Eye Contact::  Good  Speech:  Clear and Coherent  Volume:  Normal  Mood:  Depressed, Hopeless and Worthless  Affect:  Constricted and Tearful  Thought Process:  Circumstantial  Orientation:  Full  Thought Content:  Rumination  Suicidal Thoughts:  Yes.  without intent/plan  Homicidal Thoughts:  No  Memory:  Immediate;   Good Recent;   Good Remote;   Good  Judgement:  Poor  Insight:  Lacking  Psychomotor Activity:  Psychomotor Retardation  Concentration:  Good  Recall:  Good  Akathisia:  No  Handed:    AIMS (if indicated):     Assets:  Desire for Improvement  Sleep:  Number of Hours: 6    Vital Signs:Blood pressure 119/66, pulse 66, temperature  98.4 F (36.9 C), temperature source Oral, resp. rate 18, height 5\' 7"  (1.702 m), weight 82.101 kg (181 lb). Current Medications: Current Facility-Administered Medications  Medication Dose Route Frequency Provider Last Rate Last Dose  . acetaminophen (TYLENOL) tablet 650 mg  650 mg Oral Q6H PRN Nehemiah Settle, MD   650 mg at 07/09/11 0840  . alum & mag hydroxide-simeth (MAALOX/MYLANTA) 200-200-20 MG/5ML suspension 30 mL  30 mL Oral Q4H PRN Nehemiah Settle, MD      . ARIPiprazole (ABILIFY) tablet 2 mg  2 mg Oral Daily Jorje Guild, PA-C      . citalopram (CELEXA) tablet 30 mg  30 mg Oral Daily Alyson Kuroski-Mazzei, DO   30 mg at 07/10/11 0807  . divalproex (DEPAKOTE ER) 24 hr tablet 750 mg  750 mg Oral Daily Alyson Kuroski-Mazzei, DO   750 mg at 07/10/11 0807  . hydrOXYzine (ATARAX/VISTARIL) tablet 25 mg  25 mg Oral TID PRN Jorje Guild, PA-C   25 mg at 07/10/11 0653  . magnesium hydroxide (MILK OF MAGNESIA) suspension 30 mL  30 mL Oral Daily PRN Nehemiah Settle, MD      . mulitivitamin with minerals tablet 1 tablet  1 tablet Oral Daily Alyson Kuroski-Mazzei, DO   1 tablet at 07/10/11 0807  . thiamine (B-1) injection 100 mg  100 mg Intramuscular Once [name], DO      . thiamine (VITAMIN B-1) tablet 100 mg  100 mg Oral Daily Alyson Kuroski-Mazzei, DO   100 mg at 07/10/11 0807  . traZODone (DESYREL) tablet 100 mg  100 mg Oral QHS Nehemiah Settle, MD   100 mg at 07/09/11 2239    Lab Results: No results found for this or any previous visit (from the past 48 hour(s)).  Physical Findings: AIMS:  , ,  ,  ,    CIWA:  CIWA-Ar Total: 0  COWS:     Treatment Plan Summary: Daily contact with patient to assess and evaluate symptoms and progress in treatment Medication management  Plan: We will add a low dose of Abilify to his medication regimen to target his depression and ruminating thoughts. Further discharge planning should  progress.  Danissa Rundle 07/10/2011, 9:37 AM

## 2011-07-10 NOTE — Progress Notes (Signed)
Pt pleasant on approach, states that he is still struggling with depression and anxiety.  Pt positive for group this evening, denies HI/hallucinations, still endorses passive SI and feels that if he were to leave he would probably do himself harm.  Feels guilt over family. Pt somewhat brighter on second encounter. Support and encouragement offered, will continue to monitor.

## 2011-07-10 NOTE — Progress Notes (Signed)
Therapist met with pt at his request following the after care planning group. Pt was tearful and upset. He stated that he is having general S/I and that he has "done it before" showing scar where he tried to cut his thumb off. Pt has no specific plan to harm himself here at [facility] and did contact for safety here at the hospital. Pt also stated that he is "shaky" and has a headache and is experiencing "chills". Pt spoke about feeling he wont get clean and hopelessness around being successful in recovery. Pt did say on a positive note that he spoke to his employer and he is very supportive the employer is willing to pick the pt up and transport him to [facility] on Tuesday morning.

## 2011-07-10 NOTE — Progress Notes (Signed)
Patient ID: [name], male   DOB: December 08, 1965, 46 y.o.   MRN: [id number]  Pt. attended and participated in aftercare planning group. Pt. accepted information on suicide prevention, warning signs to look for with suicide and crisis line numbers to use. The pt. agreed to call crisis line numbers if having warning signs or having thoughts of suicide. Pt. listed their current anxiety level as 7 and their current depression level as 7. Pt. shared that he is experiencing pain and chills. Pt. is having suicidal ideations but was able to contract for safety.

## 2011-07-10 NOTE — Progress Notes (Signed)
Patient ID: Michaeljohn Biss, male   DOB: [birth year], 46 y.o.   MRN: [id number]   [facility] Group Notes:  (Counselor/Nursing/MHT/Case Management/Adjunct)  07/10/2011 1:15 PM  Type of Therapy:  Group Therapy, Dance/Movement Therapy   Participation Level:  Minimal  Participation Quality:  Inattentive and Redirectable  Affect:  Appropriate  Cognitive:  Appropriate  Insight:  Limited  Engagement in Group:  Limited  Engagement in Therapy:  Limited  Modes of Intervention:  Clarification, Problem-solving, Role-play, Socialization and Support  Summary of Progress/Problems: Therapist discussed how supports relate to letting go of addiction. Therapist asked patients to share something they enjoyed to do as a child and how they can still use some of those activities as support to aid in recovery. Therapist used a hula-hoop to symbolize support as well as addiction and modeled how each can interact in our lives. Pt. was inattentive in group and disruptive. Pt. needed to be redirected and carried on side conversations with another pt.     Cassidi Long

## 2011-07-10 NOTE — Progress Notes (Addendum)
Patient ID: [name], male   DOB: 05-30-1965, 46 y.o.   MRN: [id number] Pt is awake and active on the unit this AM. Pt mood is anxious and affect is appropriate to the situation. Pt endorses passive SI but is able to contract for safety. Pt is attending groups and is cooperative with staff. Pt states that he is not feeling ready to d/c at this time due to anxiety. Pt is responding to  antianxiety medication. Writer will continue to monitor. Pt is anxious and tearful this afternoon. Writer offered encouragement for pt to continue moving forward with recovery.

## 2011-07-11 DIAGNOSIS — F411 Generalized anxiety disorder: Secondary | ICD-10-CM

## 2011-07-11 DIAGNOSIS — F1994 Other psychoactive substance use, unspecified with psychoactive substance-induced mood disorder: Secondary | ICD-10-CM

## 2011-07-11 LAB — DIFFERENTIAL
Basophils Absolute: 0 10*3/uL (ref 0.0–0.1)
Eosinophils Absolute: 0.3 10*3/uL (ref 0.0–0.7)
Eosinophils Relative: 4 % (ref 0–5)
Lymphocytes Relative: 31 % (ref 12–46)
Lymphs Abs: 2.3 10*3/uL (ref 0.7–4.0)
Neutrophils Relative %: 52 % (ref 43–77)

## 2011-07-11 LAB — CBC
MCV: 95.6 fL (ref 78.0–100.0)
Platelets: 195 10*3/uL (ref 150–400)
RBC: 4.13 MIL/uL — ABNORMAL LOW (ref 4.22–5.81)
RDW: 13.1 % (ref 11.5–15.5)
WBC: 7.3 10*3/uL (ref 4.0–10.5)

## 2011-07-11 MED ORDER — ARIPIPRAZOLE 5 MG PO TABS
5.0000 mg | ORAL_TABLET | Freq: Every day | ORAL | Status: DC
  Administered 2011-07-12: 5 mg via ORAL
  Filled 2011-07-11 (×2): qty 1
  Filled 2011-07-11: qty 14
  Filled 2011-07-11: qty 1

## 2011-07-11 NOTE — Progress Notes (Signed)
[facility] Group Notes:  (Counselor/Nursing/MHT/Case Management/Adjunct)  07/11/2011 3:56 PM  Type of Therapy:  Group Therapy  Participation Level:  Active  Participation Quality:  Monopolizing  Affect:  Anxious and tearful  Cognitive:  Oriented  Insight:  Limited  Engagement in Group:  Good  Engagement in Therapy:  Limited  Modes of Intervention:  Clarification, Limit-setting, Problem-solving and Support  Summary of Progress/Problems:  Drayke came into group late tearful and shared that he wasn't doing very well.  "I mean when am I ever going to grow up, I feel so hopeless and powerless to change."  Patient reiterated this message in several different ways, was given limits and responded at some point.  Patient was somewhat open to seeing that he is in state of readiness for change and changes he makes now will likely be of great importance.    [name] 07/11/2011, 3:56 PM  [facility] Group Notes:  (Counselor/Nursing/MHT/Case Management/Adjunct)  07/11/2011 4:00 PM  Type of Therapy:  Group Therapy  Participation Level:  Minimal  Participation Quality:  Attentive  Affect:  Appropriate  Cognitive:  Appropriate  Engagement in Group:  Good  Modes of Intervention:  Education, Socialization and Support  Summary of Progress/Problems: Patient attended group presentation by Interior and spatial designer of  [employer] ([employer]). Amere was attentive and appropriate during session.  He shared name of song that would portray the story of his life would be "Patience."   [name] 07/11/2011, 4:00 PM

## 2011-07-11 NOTE — Discharge Planning (Signed)
[name] attended AM group.  Stated he had a bad weekend.  Suicidal ideation, terrible night sweats, anxiety.  Follows this up by stating that he no longer wishes to go to [facility] rehab.  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  Likely d/c tomorrow.

## 2011-07-11 NOTE — Progress Notes (Signed)
Pt has been up for groups today.  He rated his depression hopelessness a 6 and his anxiety a 6.5 on his self-inventory.  He has been rather somatic today.  He told the PA that he was having some bloody stool and he was instructed to leave any vomitus or stool in the bathroom to allow staff to see.  He has some blood work ordered and his Orlinda Blalock will increase to 5 mg tonight. He voiced understanding to all instruction provided.   He still admits to having some thoughts "off an on" but does contract for safety on the unit.  He did ask for a vistaril at 1303 for his anxiety which he later reported it "helped"  He does not want to go to [facility] tomorrow he now wants to return home to live with his grandmother and go back to work.  He has been worked up for possible discharge tomorrow.

## 2011-07-11 NOTE — Progress Notes (Signed)
Patient ID: [name], male   DOB: [birth year], 46 y.o.   MRN: [id number] Pt. Anxious, reports he does want to be discharged until Wednesday. "I have two checks at my house and I can pay to stay" "I want my boss to come pick me up and he lives away". Writer told pt. She has no control over discharge date. Pt. Reports passive SI, but denies plan, contracts for safety.  Pt. Then told writer that thoughts were anxiety driven r/t discharge. Writer told pt. Labs to be drawn tonight.  Pt. Attends group and also noted in dayroom talking to a male client most of the night. Staff will monitor q59min for safety.

## 2011-07-11 NOTE — Progress Notes (Signed)
Patient ID: [name], male   DOB: [birth year], 46 y.o.   MRN: [id number]  [facility] MD Progress Note                                         07/11/2011    [name] 03-26-1965    [id number] Hospital day #7  [id number] Dependence & W/D; SIMD; HTN, per Hx (no current meds); Anxiety Disorder NOS   The patient was seen today and reports the following:  Sleep: "poor" Appetite: "improving"  Mild>(1-10) >Severe  Hopelessness (1-10): "no hopelessness, but I'm disappointed in the way my life has turned out." Depression (1-10): 6/10 Anxiety (1-10):  8/10 Suicidal Ideation: .  Pt. States he would rate his "suicidality" at a 5/10, but does report feeling safe on the unit. Plan: None Intent: None Means:  None Homicidal Ideation:  None Plan: None Intent: None Means: None  Eye Contact: Good.  General Appearance/Behavior: Pt. Is neatly dressed. Motor Behavior:  Normal Speech:clear and goal directed  Mental Status: oriented x 3 Level of Consciousness:  alert Mood:  depressed Affect: tearfull  Thought Process:  normal Thought Content: states "some shadows" out of the corner of his eye occasionally. Perception: intact  Judgment: fair Insight: lacking Cognition: at least average  Sleep: Number of Hours:   Filed Vitals:   07/11/11 0616  BP: 125/75  Pulse: 63  Temp:   Resp:     . ARIPiprazole  2 mg Oral Daily  . citalopram  30 mg Oral Daily  . divalproex  750 mg Oral Daily  . multivitamin with minerals  1 tablet Oral Daily  . thiamine  100 mg Intramuscular Once  . thiamine  100 mg Oral Daily  . traZODone  100 mg Oral QHS     No results found for this or any previous visit (from the past 48 hour(s)).  ROS:    Constitutional: WDWN AAF NAD   GI: Negative for N,V,D,C   Neuro: Negative for dizziness, blurred vision, visual changes, headaches   Resp: Negative for wheezing, SOB, cough   Cardio: Negative for CP, diaphoresis, fatigue   MSK: Negative for joint pain, swelling,  DROM, or ambulatory difficulties.  The patient was seen and time was spent discussing his current symptoms.  Today he notes that he is depressed and overwhelmed. He is tearful and anxious.  [name] also states that he has had rectal bleeding with bright red blood for the past two years.  He says that he doesn't feel that people are listening to him about his health issues and it is overwhelming him.  He notes that this particular detox has been very physically hard for him and reports that he is sweating a great deal, feels more emotional than usual, and has had some nausea as well.  [name] is reassured that his medications will be evaluated and increased to help his current mood lability.  Treatment Plan Summary:  1. Daily contact with patient to assess and evaluate symptoms and progress in treatment.  2. Medication management  3. The patient will deny suicidal ideations or homicidal ideations for 48 hours prior to discharge and have a depression and anxiety rating of 3 or less. The patient will also deny any auditory or visual hallucinations or delusional thinking.  4. The patient will deny any symptoms of substance withdrawal at time of discharge.   PLAN: 1. Will increase the  Abilify to 5mg  a day to help with depression. 2. Will consider increase in Depakote to control mood lability. 3. Will check a depakote level if that has not been done lately. 4. Will also consider maximizing celexa dose to 40mg  as well. 5. CBC diff to evaluate for possible anemia due to reports of rectal bleeding and consider GI consult for possible colonoscopy upon discharge if no resolution.  [name]. [name] [facility] 07/11/2011

## 2011-07-12 MED ORDER — CITALOPRAM HYDROBROMIDE 10 MG PO TABS
30.0000 mg | ORAL_TABLET | Freq: Every day | ORAL | Status: DC
  Filled 2011-07-12 (×2): qty 42

## 2011-07-12 MED ORDER — ARIPIPRAZOLE 5 MG PO TABS: 5.0000 mg | ORAL_TABLET | Freq: Every day | ORAL | Status: DC

## 2011-07-12 MED ORDER — DIVALPROEX SODIUM ER 250 MG PO TB24
750.0000 mg | ORAL_TABLET | Freq: Every day | ORAL | Status: DC
  Filled 2011-07-12: qty 42

## 2011-07-12 MED ORDER — CITALOPRAM HYDROBROMIDE 10 MG PO TABS: 30.0000 mg | ORAL_TABLET | Freq: Every day | ORAL | Status: DC

## 2011-07-12 MED ORDER — TRAZODONE HCL 100 MG PO TABS: 100.0000 mg | ORAL_TABLET | Freq: Every day | ORAL | Status: DC

## 2011-07-12 MED ORDER — DIVALPROEX SODIUM ER 250 MG PO TB24: 750.0000 mg | ORAL_TABLET | Freq: Every day | ORAL | Status: DC

## 2011-07-12 NOTE — Treatment Plan (Signed)
Interdisciplinary Treatment Plan Update (Adult)  Date: 07/12/2011  Time Reviewed: 11:24 AM   Progress in Treatment: Attending groups: Yes Participating in groups: Yes Taking medication as prescribed: Yes Tolerating medication: Yes   Family/Significant othe contact made:   Patient understands diagnosis:  Yes Discussing patient identified problems/goals with staff:  Yes Medical problems stabilized or resolved:  Yes Denies suicidal/homicidal ideation: Yes Issues/concerns per patient self-inventory:  Yes  Multiple physical complaints Other:  New problem(s) identified: N/A  Reason for Continuation of Hospitalization: Other; describe D/C today  Interventions implemented related to continuation of hospitalization:   Additional comments:  Estimated length of stay:  Discharge Plan: Return home, follow up outpt  New goal(s): N/A  Review of initial/current patient goals per problem list:   1.  Goal(s):Eliminate SI  Met:  Yes  Target date:6/11  As evidenced GN:FAOZ report  2.  Goal (s): Stabilize mood  Met:  Yes  Target date:6/11  As evidenced HY:QMVH will decrease angry outbursts and exhibit fewer labile symptoms  3.  Goal(s): Safely detox from alcohol  Met:  Yes  Target date:6/11  As evidenced by:no withdrawal symptoms, stable vitals  4.  Goal(s): Get into rehab from here  Met:  No  Target date:6/11  As evidenced QI:ONGE changed his mind, decided he did not want to go to [facility] and will return home and attend AA mtgs instead  Attendees: Patient:  [name] 07/12/2011 11:42 AM  Family:     Physician:  [name] 07/12/2011 11:24 AM   Nursing:  Roswell Miners  07/12/2011 11:24 AM   Case Manager:  Richelle Ito, LCSW 07/12/2011 11:24 AM   Counselor:  Ronda Fairly, LCSWA 07/12/2011 11:24 AM   Other:     Other:     Other:     Other:      Scribe for Treatment Team:   Ida Rogue, 07/12/2011 11:24 AM

## 2011-07-12 NOTE — Progress Notes (Signed)
Patient ID: [name], male   DOB: Nov 28, 1965, 46 y.o.   MRN: [id number] Pt is prepared for d/c.  He verbalizes understanding of his d/c meds and followup.  He has  Signed ROI for [employer].  He knows to go to Urgent care if rectal bleeding reoccurs.  He will be given scripts and 2 week supply of meds when his boss arrives to give him a ride home.  Pt was given suicide prevention pamphlet.

## 2011-07-12 NOTE — Progress Notes (Signed)
Patient ID: [name], male   DOB: 11/23/1965, 46 y.o.   MRN: [id number] Pt reports fair sleep and improving appetite.  He rates his depression a 6 and reports feeling agitated.  Said he was soaking wet when he awoke this am.  He reports seeing blood in his stool and agrees to save it to show staff.  His RBC is 4.13 and MD informed.  Pt says he plans to try to have a positive attitude to turn around his day that stated with him feeling very agitated"You probably got a bad report on me"  Pt calm and willing to talk about his feelings.

## 2011-07-12 NOTE — Progress Notes (Signed)
Patient ID: [name], male   DOB: 1965-10-08, 46 y.o.   MRN: [id number] Pt was discharged at 1350.
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
## 2011-07-12 NOTE — Progress Notes (Signed)
[facility] Case Management Discharge Plan:  Will you be returning to the same living situation after discharge: Yes,  home At discharge, do you have transportation home?:Yes,  boss Do you have the ability to pay for your medications:Yes,  mental health referral  Interagency Information:     Release of information consent forms completed and in the chart;  Patient's signature needed at discharge.  Patient to Follow up at:  Follow-up Information    Follow up with [facility] on 07/15/2011. (walk in on Friday at 8AM for your hospital follow up appointment.  This is where you will see a doctor for your medications)    Contact information:   [address] [name]  [phone]         Patient denies SI/HI:   Yes,  yes    Safety Planning and Suicide Prevention discussed:  Yes,  yes  Barrier to discharge identified:No.  Summary and Recommendations:   [name] 07/12/2011, 11:39 AM

## 2011-07-12 NOTE — Discharge Summary (Signed)
Physician Discharge Summary Note  Patient:  [name] is an 46 y.o., male MRN:  [id number] DOB:  1965-08-29 Patient phone:  [phone] (home)  Patient address:   [address] [place] [address],   Date of Admission:  07/04/2011 Date of Discharge: 07/12/11  Reason for Admission: Alcohol detox  Discharge Diagnoses: Principal Problem:  *Psychoactive substance-induced organic mood disorder Active Problems:  Alcoholism /alcohol abuse   Axis Diagnosis:   AXIS I:  Alcohol Abuse and Psychoactive substance-induced organic mood disorder AXIS II:  Deferred AXIS III:   Past Medical History  Diagnosis Date  . Hypertension   . Alcoholism /alcohol abuse    AXIS IV:  Substance abuse issues. AXIS V:  65  Level of Care:  OP  Hospital Course:  This is a 46 year old Caucasian male, admitted to [facility] from the [facility] ED with complaints of alcoholic problems requesting detoxification. Patient reports, "I have been having drinking problems since I was 46 years old. On my 15th birthday, my mother bought me a pack of cigarettes and a crest of beer and said, here son, enjoy your self. That is how I got introduced to alcohol. When I started alcohol drinking, I used to get fun out of it, but it is not so any more. Alcoholism is getting me into a major mess. My life is terrible as it is right now. I lost all my privileges to seeing my 39 year old daughter because I am an alcoholic. I lost the precious relationship that is God given between father and daughter. I am employed, but may not have a job after these hospitalization. But I don't care because I have to keep the focus on me getting better. I don't know what has gotten into me. I have ruined my own life. I hope that I can get into a residential treatment that is at least 1 month long. I can't quit drinking on my own. I need the help. I have been in this hospital a couple of times. I had been to [facility], [facility] and [facility]. I would prefer  [facility] after being here this time. I know that [facility] will help me. They are that good".  Upon admission in this hospital, patient was prescribed and started on Librium detoxification protocol for his alcoholism and to combat withdrawal symptoms of alcohol. He was also medicated with Abilify 5 mg for mood control, Citalopram 10 mg for depressive symptoms, Valproic ER 250 mg for mood stabilization and Trazodone 100 mg for sleep. Patient took his medications as prescribed without any significant adverse effects and or reactions.  He was also enrolled in group counseling and activities, of which he participated actively. He also attended AA/NA meetings held within this unit. Patient responded well to his treatment regimen. He attended treatment team meeting this am and met with his treatment team members. His substance abuse issues, discharge plans and treatment regimen were discussed with patient.   Patient has indicated upon admission that he would like to go to a treatment facility center straight from this hospital upon discharge. However, as of yesterday,  he has changed his mind about going to [facility] treatment center, rather he will go home to his grandmother and resume work from there. Patient will continue psychiatric care on outpatient basis at [facility] in [place] on 07/15/11. The address, date and time for this appointment provided for patient. Patient is also aware that this is a walk-in appointment between the  hours of 08: 00 am and 03: 00 pm. He received from [facility] a 2 weeks supply samples of his discharge medications. Patient left [facility] with all personal belongings in no apparent distress. His boss provided transportation.  He    Consults:  None  Significant Diagnostic Studies:  labs: CBC, CMP  Discharge Vitals:   Blood pressure 143/63, pulse 75, temperature 97.6 F (36.4 C), temperature source Oral, resp. rate 16, height 5\' 7"  (1.702  m), weight 82.101 kg (181 lb).  Mental Status Exam: See Mental Status Examination and Suicide Risk Assessment completed by Attending Physician prior to discharge.  Discharge destination:  Home  Is patient on multiple antipsychotic therapies at discharge:  No   Has Patient had three or more failed trials of antipsychotic monotherapy by history:  No  Recommended Plan for Multiple Antipsychotic Therapies: NA   Medication List  As of 07/12/2011  2:29 PM   TAKE these medications      Indication    ARIPiprazole 5 MG tablet   Commonly known as: ABILIFY   Take 1 tablet (5 mg total) by mouth daily. For mood control       citalopram 10 MG tablet   Commonly known as: CELEXA   Take 3 tablets (30 mg total) by mouth daily. For depression       divalproex 250 MG 24 hr tablet   Commonly known as: DEPAKOTE ER   Take 3 tablets (750 mg total) by mouth daily. For mood control and stabilization       traZODone 100 MG tablet   Commonly known as: DESYREL   Take 1 tablet (100 mg total) by mouth at bedtime. For sleep            Follow-up Information    Follow up with [facility] on 07/15/2011. (walk in on Friday at 8AM for your hospital follow up appointment.  This is where you will see a doctor for your medications)    Contact information:   [address] [name]  [phone]         Follow-up recommendations:  Activity:  as tolerated Other:  Keep all scheduled follow-up appointments as recommended.  Comments:  Take all your medicines as prescribed. Report any adverse effects from medications to your outpatient provider. Patient is instructed and cautioned to not engage in alcohol and or illegal drug use while on prescription medicines. In the event of worsening symptoms, patient is instructed to call 911, the crisis hot line and or go to the nearest ED.  SignedArmandina Stammer I 07/12/2011, 2:29 PM

## 2011-07-13 NOTE — Progress Notes (Signed)
Patient Discharge Instructions: No consent for Margaretha Seeds, 07/13/2011, 1:13 PM

## 2011-07-19 ENCOUNTER — Encounter (HOSPITAL_COMMUNITY): Payer: Self-pay | Admitting: *Deleted

## 2011-07-19 ENCOUNTER — Emergency Department (HOSPITAL_COMMUNITY)
Admission: EM | Admit: 2011-07-19 | Discharge: 2011-07-20 | Disposition: A | Discharge status: Other Acute Inpatient Hospital | Payer: No Typology Code available for payment source | Source: Home / Self Care | Attending: Emergency Medicine | Admitting: Emergency Medicine

## 2011-07-19 DIAGNOSIS — R45851 Suicidal ideations: Secondary | ICD-10-CM

## 2011-07-19 DIAGNOSIS — F1010 Alcohol abuse, uncomplicated: Secondary | ICD-10-CM

## 2011-07-19 NOTE — ED Notes (Signed)
Per EMS - pt here w/ ETOH and requesting detox. Pt w/ hx of same. Pt admits to drinking x16 40oz beers.

## 2011-07-20 ENCOUNTER — Inpatient Hospital Stay (HOSPITAL_COMMUNITY)
Admission: EM | Admit: 2011-07-20 | Discharge: 2011-07-25 | DRG: 897 | Disposition: A | Discharge status: Home / Self Care | Payer: No Typology Code available for payment source | Source: Ambulatory Visit | Attending: Psychiatry | Admitting: Psychiatry

## 2011-07-20 ENCOUNTER — Encounter (HOSPITAL_COMMUNITY): Payer: Self-pay

## 2011-07-20 DIAGNOSIS — Z9149 Other personal history of psychological trauma, not elsewhere classified: Secondary | ICD-10-CM

## 2011-07-20 DIAGNOSIS — F10988 Alcohol use, unspecified with other alcohol-induced disorder: Secondary | ICD-10-CM | POA: Diagnosis present

## 2011-07-20 DIAGNOSIS — I10 Essential (primary) hypertension: Secondary | ICD-10-CM | POA: Diagnosis present

## 2011-07-20 DIAGNOSIS — F17200 Nicotine dependence, unspecified, uncomplicated: Secondary | ICD-10-CM | POA: Diagnosis present

## 2011-07-20 DIAGNOSIS — F10239 Alcohol dependence with withdrawal, unspecified: Secondary | ICD-10-CM

## 2011-07-20 DIAGNOSIS — Z634 Disappearance and death of family member: Secondary | ICD-10-CM

## 2011-07-20 DIAGNOSIS — Z79899 Other long term (current) drug therapy: Secondary | ICD-10-CM

## 2011-07-20 DIAGNOSIS — F1020 Alcohol dependence, uncomplicated: Principal | ICD-10-CM | POA: Diagnosis present

## 2011-07-20 DIAGNOSIS — F39 Unspecified mood [affective] disorder: Secondary | ICD-10-CM

## 2011-07-20 LAB — COMPREHENSIVE METABOLIC PANEL
ALT: 14 U/L (ref 0–53)
Alkaline Phosphatase: 61 U/L (ref 39–117)
BUN: 8 mg/dL (ref 6–23)
CO2: 26 mEq/L (ref 19–32)
GFR calc Af Amer: >90 mL/min (ref >90)
GFR calc non Af Amer: >90 mL/min (ref >90)
Glucose, Bld: 84 mg/dL (ref 70–99)
Potassium: 3.2 mEq/L — ABNORMAL LOW (ref 3.5–5.1)
Sodium: 133 mEq/L — ABNORMAL LOW (ref 135–145)
Total Bilirubin: 0.3 mg/dL (ref 0.3–1.2)
Total Protein: 7.1 g/dL (ref 6.0–8.3)

## 2011-07-20 LAB — ETHANOL: Alcohol, Ethyl (B): 175 mg/dL — ABNORMAL HIGH (ref 0–11)

## 2011-07-20 LAB — DIFFERENTIAL
Basophils Absolute: 0 10*3/uL (ref 0.0–0.1)
Basophils Relative: 1 % (ref 0–1)
Lymphocytes Relative: 48 % — ABNORMAL HIGH (ref 12–46)
Monocytes Absolute: 0.5 10*3/uL (ref 0.1–1.0)
Neutro Abs: 2 10*3/uL (ref 1.7–7.7)
Neutrophils Relative %: 38 % — ABNORMAL LOW (ref 43–77)

## 2011-07-20 LAB — CBC
MCHC: 35.5 g/dL (ref 30.0–36.0)
Platelets: 216 10*3/uL (ref 150–400)
RDW: 12.3 % (ref 11.5–15.5)
WBC: 5.2 10*3/uL (ref 4.0–10.5)

## 2011-07-20 LAB — RAPID URINE DRUG SCREEN, HOSP PERFORMED
Amphetamines: NOT DETECTED
Opiates: NOT DETECTED

## 2011-07-20 MED ORDER — VITAMIN B-1 100 MG PO TABS
100.0000 mg | ORAL_TABLET | Freq: Every day | ORAL | Status: DC
  Administered 2011-07-21 – 2011-07-25 (×5): 100 mg via ORAL
  Filled 2011-07-20 (×6): qty 1

## 2011-07-20 MED ORDER — ARIPIPRAZOLE 5 MG PO TABS
5.0000 mg | ORAL_TABLET | Freq: Every day | ORAL | Status: DC
  Administered 2011-07-20 – 2011-07-25 (×6): 5 mg via ORAL
  Filled 2011-07-20 (×9): qty 1

## 2011-07-20 MED ORDER — ARIPIPRAZOLE 5 MG PO TABS
5.0000 mg | ORAL_TABLET | Freq: Every day | ORAL | Status: DC
  Filled 2011-07-20: qty 1

## 2011-07-20 MED ORDER — POTASSIUM CHLORIDE CRYS ER 20 MEQ PO TBCR
20.0000 meq | EXTENDED_RELEASE_TABLET | Freq: Two times a day (BID) | ORAL | Status: DC
  Administered 2011-07-20 – 2011-07-25 (×10): 20 meq via ORAL
  Filled 2011-07-20 (×14): qty 1

## 2011-07-20 MED ORDER — IBUPROFEN 600 MG PO TABS: 600.0000 mg | ORAL_TABLET | Freq: Three times a day (TID) | ORAL | Status: DC | PRN

## 2011-07-20 MED ORDER — ALUM & MAG HYDROXIDE-SIMETH 200-200-20 MG/5ML PO SUSP: 30.0000 mL | ORAL | Status: DC | PRN

## 2011-07-20 MED ORDER — LORAZEPAM 1 MG PO TABS: 1.0000 mg | ORAL_TABLET | Freq: Four times a day (QID) | ORAL | Status: DC | PRN

## 2011-07-20 MED ORDER — ADULT MULTIVITAMIN W/MINERALS CH
1.0000 | ORAL_TABLET | Freq: Every day | ORAL | Status: DC
  Administered 2011-07-21 – 2011-07-25 (×5): 1 via ORAL
  Filled 2011-07-20 (×7): qty 1

## 2011-07-20 MED ORDER — HYDROXYZINE HCL 25 MG PO TABS
25.0000 mg | ORAL_TABLET | Freq: Four times a day (QID) | ORAL | Status: AC | PRN
  Administered 2011-07-22: 25 mg via ORAL
  Filled 2011-07-20: qty 1

## 2011-07-20 MED ORDER — CHLORDIAZEPOXIDE HCL 25 MG PO CAPS: 50.0000 mg | ORAL_CAPSULE | Freq: Once | ORAL | Status: DC

## 2011-07-20 MED ORDER — TRAZODONE HCL 100 MG PO TABS: 100.0000 mg | ORAL_TABLET | Freq: Every day | ORAL | Status: DC

## 2011-07-20 MED ORDER — LORAZEPAM 2 MG/ML IJ SOLN: 1.0000 mg | Freq: Four times a day (QID) | INTRAMUSCULAR | Status: DC | PRN

## 2011-07-20 MED ORDER — FOLIC ACID 1 MG PO TABS: 1.0000 mg | ORAL_TABLET | Freq: Every day | ORAL | Status: DC

## 2011-07-20 MED ORDER — MAGNESIUM HYDROXIDE 400 MG/5ML PO SUSP: 30.0000 mL | Freq: Every day | ORAL | Status: DC | PRN

## 2011-07-20 MED ORDER — CHLORDIAZEPOXIDE HCL 25 MG PO CAPS: 25.0000 mg | ORAL_CAPSULE | Freq: Four times a day (QID) | ORAL | Status: AC | PRN

## 2011-07-20 MED ORDER — THIAMINE HCL 100 MG/ML IJ SOLN: 100.0000 mg | Freq: Once | INTRAMUSCULAR | Status: DC

## 2011-07-20 MED ORDER — POTASSIUM CHLORIDE CRYS ER 20 MEQ PO TBCR
40.0000 meq | EXTENDED_RELEASE_TABLET | Freq: Once | ORAL | Status: AC
  Administered 2011-07-20: 40 meq via ORAL
  Filled 2011-07-20: qty 2

## 2011-07-20 MED ORDER — DIVALPROEX SODIUM ER 500 MG PO TB24
750.0000 mg | ORAL_TABLET | Freq: Every day | ORAL | Status: DC
  Administered 2011-07-20 – 2011-07-25 (×6): 750 mg via ORAL
  Filled 2011-07-20 (×7): qty 1
  Filled 2011-07-20: qty 3
  Filled 2011-07-20: qty 1

## 2011-07-20 MED ORDER — CHLORDIAZEPOXIDE HCL 25 MG PO CAPS
25.0000 mg | ORAL_CAPSULE | Freq: Four times a day (QID) | ORAL | Status: AC
  Administered 2011-07-20 – 2011-07-21 (×5): 25 mg via ORAL
  Filled 2011-07-20 (×6): qty 1

## 2011-07-20 MED ORDER — ONDANSETRON HCL 4 MG PO TABS: 4.0000 mg | ORAL_TABLET | Freq: Three times a day (TID) | ORAL | Status: DC | PRN

## 2011-07-20 MED ORDER — NICOTINE 21 MG/24HR TD PT24: 21.0000 mg | MEDICATED_PATCH | Freq: Every day | TRANSDERMAL | Status: DC

## 2011-07-20 MED ORDER — CITALOPRAM HYDROBROMIDE 20 MG PO TABS
30.0000 mg | ORAL_TABLET | Freq: Every day | ORAL | Status: DC
  Filled 2011-07-20: qty 1

## 2011-07-20 MED ORDER — ONDANSETRON 4 MG PO TBDP: 4.0000 mg | ORAL_TABLET | Freq: Four times a day (QID) | ORAL | Status: AC | PRN

## 2011-07-20 MED ORDER — DIVALPROEX SODIUM ER 500 MG PO TB24
750.0000 mg | ORAL_TABLET | Freq: Every day | ORAL | Status: DC
  Filled 2011-07-20: qty 1

## 2011-07-20 MED ORDER — CHLORDIAZEPOXIDE HCL 25 MG PO CAPS
25.0000 mg | ORAL_CAPSULE | Freq: Three times a day (TID) | ORAL | Status: AC
  Administered 2011-07-22 (×3): 25 mg via ORAL
  Filled 2011-07-20 (×2): qty 1

## 2011-07-20 MED ORDER — LOPERAMIDE HCL 2 MG PO CAPS
2.0000 mg | ORAL_CAPSULE | ORAL | Status: AC | PRN
  Administered 2011-07-22: 4 mg via ORAL

## 2011-07-20 MED ORDER — CITALOPRAM HYDROBROMIDE 10 MG PO TABS
30.0000 mg | ORAL_TABLET | Freq: Every day | ORAL | Status: DC
  Administered 2011-07-20 – 2011-07-25 (×6): 30 mg via ORAL
  Filled 2011-07-20 (×7): qty 1

## 2011-07-20 MED ORDER — CHLORDIAZEPOXIDE HCL 25 MG PO CAPS
25.0000 mg | ORAL_CAPSULE | ORAL | Status: AC
  Administered 2011-07-23 (×2): 25 mg via ORAL
  Filled 2011-07-20 (×2): qty 1

## 2011-07-20 MED ORDER — VITAMIN B-1 100 MG PO TABS: 100.0000 mg | ORAL_TABLET | Freq: Every day | ORAL | Status: DC

## 2011-07-20 MED ORDER — CHLORDIAZEPOXIDE HCL 25 MG PO CAPS
25.0000 mg | ORAL_CAPSULE | Freq: Every day | ORAL | Status: AC
  Administered 2011-07-24: 25 mg via ORAL
  Filled 2011-07-20: qty 1

## 2011-07-20 MED ORDER — ADULT MULTIVITAMIN W/MINERALS CH: 1.0000 | ORAL_TABLET | Freq: Every day | ORAL | Status: DC

## 2011-07-20 MED ORDER — TRAZODONE HCL 100 MG PO TABS
100.0000 mg | ORAL_TABLET | Freq: Every day | ORAL | Status: DC
  Administered 2011-07-20 – 2011-07-24 (×5): 100 mg via ORAL
  Filled 2011-07-20 (×6): qty 1

## 2011-07-20 MED ORDER — THIAMINE HCL 100 MG/ML IJ SOLN: 100.0000 mg | Freq: Every day | INTRAMUSCULAR | Status: DC

## 2011-07-20 MED ORDER — THIAMINE HCL 100 MG/ML IJ SOLN
Freq: Once | INTRAVENOUS | Status: AC
  Administered 2011-07-20: 01:00:00 via INTRAVENOUS
  Filled 2011-07-20: qty 1000

## 2011-07-20 MED ORDER — ACETAMINOPHEN 325 MG PO TABS
650.0000 mg | ORAL_TABLET | Freq: Four times a day (QID) | ORAL | Status: DC | PRN
  Administered 2011-07-22 (×2): 650 mg via ORAL

## 2011-07-20 MED ORDER — ACETAMINOPHEN 325 MG PO TABS: 650.0000 mg | ORAL_TABLET | ORAL | Status: DC | PRN
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
## 2011-07-20 NOTE — ED Provider Notes (Signed)
History     CSN: [id number]  Arrival date & time 07/19/11  2233   First MD Initiated Contact with Patient 07/20/11 0103      Chief Complaint  Patient presents with  . Alcohol Intoxication  . Medical Clearance    (Consider location/radiation/quality/duration/timing/severity/associated sxs/prior treatment) HPI  Pt to the ER requesting detox from alcohol and help for depression. He is also suicidal without a plan. He came to the ED intoxicated and is here voluntarily. The patient is also homeless and was here earlier this month for the same. He denies having any medical concerns that he wishes to address at the time. Pt is in NAD and VSS. He is cooperative at this time.  Past Medical History  Diagnosis Date  . Hypertension   . Alcoholism /alcohol abuse     Past Surgical History  Procedure Date  . Hernia repair     History reviewed. No pertinent family history.  History  Substance Use Topics  . Smoking status: Current Everyday Smoker -- 1.0 packs/day for 30 years    Types: Cigarettes  . Smokeless tobacco: Not on file  . Alcohol Use: Yes     Drinks every day, 9-11 40 oz beers.      Review of Systems   HEENT: denies blurry vision or change in hearing PULMONARY: Denies difficulty breathing and SOB CARDIAC: denies chest pain or heart palpitations MUSCULOSKELETAL:  denies being unable to ambulate ABDOMEN AL: denies abdominal pain GU: denies loss of bowel or urinary control NEURO: denies numbness and tingling in extremities    Allergies  Review of patient's allergies indicates no known allergies.  Home Medications   Current Outpatient Rx  Name Route Sig Dispense Refill  . ARIPIPRAZOLE 5 MG PO TABS Oral Take 1 tablet (5 mg total) by mouth daily. For mood control 30 tablet 0  . CITALOPRAM HYDROBROMIDE 10 MG PO TABS Oral Take 3 tablets (30 mg total) by mouth daily. For depression 90 tablet 0  . DIVALPROEX SODIUM ER 250 MG PO TB24 Oral Take 3 tablets (750 mg total)  by mouth daily. For mood control and stabilization 90 tablet 0  . TRAZODONE HCL 100 MG PO TABS Oral Take 1 tablet (100 mg total) by mouth at bedtime. For sleep 30 tablet 0    BP 119/71  Pulse 58  Temp 98.2 F (36.8 C) (Oral)  Resp 22  SpO2 95%  Physical Exam  Nursing note and vitals reviewed. Constitutional: He appears well-developed and well-nourished. No distress.  HENT:  Head: Normocephalic and atraumatic.  Eyes: Pupils are equal, round, and reactive to light.  Neck: Normal range of motion. Neck supple.  Cardiovascular: Normal rate and regular rhythm.   Pulmonary/Chest: Effort normal.  Abdominal: Soft.  Neurological: He is alert.  Skin: Skin is warm and dry.  Psychiatric: He exhibits a depressed mood. He expresses suicidal ideation. He expresses no homicidal ideation. He expresses no suicidal plans and no homicidal plans.    ED Course  Procedures (including critical care time)  Labs Reviewed  ETHANOL - Abnormal; Notable for the following:    Alcohol, Ethyl (B) 175 (*)     All other components within normal limits  COMPREHENSIVE METABOLIC PANEL - Abnormal; Notable for the following:    Sodium 133 (*)     Potassium 3.2 (*)     All other components within normal limits  CBC - Abnormal; Notable for the following:    RBC 4.18 (*)     HCT 38.3 (*)  All other components within normal limits  DIFFERENTIAL - Abnormal; Notable for the following:    Neutrophils Relative 38 (*)     Lymphocytes Relative 48 (*)     All other components within normal limits  URINE RAPID DRUG SCREEN (HOSP PERFORMED)   No results found.   1. Alcohol abuse   2. Suicidal ideation       MDM  Will consult ACT.       Dorthula Matas, PA 07/20/11 0230

## 2011-07-20 NOTE — ED Notes (Signed)
Pt states he drank alcohol at 1830. Pt denies illegal drug use.

## 2011-07-20 NOTE — ED Provider Notes (Signed)
Pt accepted at [facility].   Robert Vizcarrondo B. Bernette Mayers, MD 07/20/11 [phone]

## 2011-07-20 NOTE — Progress Notes (Signed)
D: This 46 y/o caucasian male was last admitted to [facility] on 07/05/11. Pt admits to last drink yesterday of 16 (40 oz) "in 33 minutes". Pt states that his stressor is that his mother died of throat cancer "3 days after I was discharged last time". 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 Pt is allergic to bee stings. Pt states he has HTN. B/P 118/65 sitting down and 110/68 standing up on admission. Pt tearful, crying, obviously anxious and upset. Pt declined flu/pneumonia shot. A: Pt taken to his room, states he was sleepy. Pt understands rules and expectations of unit. Support/Encouragement given. Waiting on MD orders. 15 minute checks for safety. R: Pt remains depressed/sad. Pt states "off and on" SI, and contracts verbally for safety with this nurse. Safety maintained.

## 2011-07-20 NOTE — ED Notes (Signed)
Katrina, NT at bedside as sitter.

## 2011-07-20 NOTE — Progress Notes (Signed)
[facility] Group Notes:  (Counselor/Nursing/MHT/Case Management/Adjunct)  07/20/2011 10:06 PM  Type of Therapy:  Group Therapy at 11  Participation Level:  Minimal  Participation Quality:  Sharing  Affect:  Depressed  Cognitive:  Oriented  Insight:  Limited  Engagement in Group:  Limited  Engagement in Therapy:  Limited  Modes of Intervention:  Orientation, Socialization and Support  Summary of Progress/Problems:  [name] attended first group session of this admit, one of numerous.  Shared his mother recently (within week) died and "I just didn't know what to do with those emotions so I drank; I messed up and here I am. I don't think I could handle the funeral."  [facility] Group Notes:  (Counselor/Nursing/MHT/Case Management/Adjunct)  07/20/2011 10:09 PM  Type of Therapy:  Group Therapy at 1:15  Participation Level:  None  Participation Quality:  Drowsy and Inattentive  Affect:  Depressed  Cognitive:  Oriented  Insight:  None shared  Engagement in Group:  Minimal, [name] would rouse when his name was called   Engagement in Therapy:  None  Modes of Intervention:  Socialization and Support and redirection   Summary of Progress/Problems: [name] was difficult to engage and drowsy.    [name] 07/20/2011, 10:09 PM
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
## 2011-07-20 NOTE — ED Notes (Signed)
Judeth Cornfield from ACT team at bedside.

## 2011-07-20 NOTE — Progress Notes (Signed)
D: Patient states he is depressed and worried about withdrawal symptoms.  Patient denies SI/HI and denies AVH. 1700 medication were not given according to [facility] (previous shift). Patient attended group tonight A: Patient offered encouragement and support. Patient compliant with medications.  Staff to monitor Q15 mins for safety.   R: Patient pleasant and cooperative.  Patient resting comfortably no apparent distress.  Safety manitained

## 2011-07-20 NOTE — ED Provider Notes (Signed)
Medical screening examination/treatment/procedure(s) were performed by non-physician practitioner and as supervising physician I was immediately available for consultation/collaboration.   Hanley Seamen, MD 07/20/11 [phone]

## 2011-07-20 NOTE — Progress Notes (Signed)
Pt. [name];ying in the bed and stated he just found out his mother died 3 days ago of throat cancer in [place]. Pt. Wanted to just rest and not go outdoors with the other pts. Denies Si or HI but did state he was depressed. Contracts for safety,
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
## 2011-07-21 NOTE — Progress Notes (Signed)
Pt attended discharge planning group and actively participated.  Pt presents with flat affect and depressed mood.  Pt rates depression at a 7 and anxiety at a 9 today.  Pt reports having SI this morning but denies SI right now.  Pt states that he came here for alcohol detox.  Pt states that he wants to go to [facility] for further treatment once detox is complete.  SW left a voicemail message for [facility] to obtain a date for pt to go there.  No further needs voiced by pt at this time.    [name], LCSWA 07/21/2011  9:09 AM

## 2011-07-21 NOTE — Progress Notes (Signed)
[facility] Group Notes:  (Counselor/Nursing/MHT/Case Management/Adjunct)  07/21/2011 4:58 PM  Type of Therapy:  Group Therapy at 11  Participation Level:  Active  Participation Quality:  Attentive and Sharing  Affect:  Irritable  Cognitive:  Alert and Oriented  Insight:  Limited  Engagement in Group:  Good  Engagement in Therapy:  Limited  Modes of Intervention:  Clarification, Limit-setting and Socialization  Summary of Progress/Problems:  Blanchard was attentive to introductions of new patients to group and appeared to be empathetic and  accepting of others.  During discussion of balance in life patient shared how other people and himself influence that fragile balance. Patient was asked to consider the possibility that he can be gained balance with out going from one extreme to the other in starting all over.  Patient responded "want to drink I have to start all over, it is the line. One day and I'm done; I need help."    [name] 07/21/2011, 5:03 PM  [facility] Group Notes:  (Counselor/Nursing/MHT/Case Management/Adjunct)  07/21/2011 5:04 PM  Type of Therapy:  Group Therapy at 1:15  Participation Level:  Active  Participation Quality:  Attentive and Sharing  Affect:  Depressed and Irritable  Cognitive:  Oriented  Insight:  Okay  Engagement in Group:  Limited  Engagement in Therapy:  Limited  Modes of Intervention:  Clarification, Socialization and Support  Summary of Progress/Problems: Patient was somewhat irritable but unwilling to share what was going on.   Anchor participated in activity in which patients choose photographs to represent what their life would look and feel like were it in balance and another for out of balance. He choose a phot of an alcohol bottle to re[present when things are out of balance and a photo of a family to represent when things are in balance. Patient shared his fear of loosing family forever.    [name] 07/21/2011, 5:07 PM

## 2011-07-21 NOTE — H&P (Signed)
Medical/psychiatric screening examination/treatment/procedure(s) were performed by non-physician practitioner and as supervising physician I was immediately available for consultation/collaboration.  I have seen and examined this patient and agree the major elements of this evaluation.  

## 2011-07-21 NOTE — Progress Notes (Signed)
[facility] Adult Inpatient Family/Significant Other Suicide Prevention Education  Suicide Prevention Education:  Contact Attempts: Patient's employer of 15 years, Dallie Piles at [phone]  has been identified by the patient as the family member/significant other with whom the patient will be residing, and identified as the person(s) who will aid the patient in the event of a mental health crisis.  With written consent from the patient, two attempts were made to provide suicide prevention education, prior to and/or following the patient's discharge.  We were unsuccessful in providing suicide prevention education.  A suicide education pamphlet was given to the patient to share with family/significant other.  Date and time of first attempt: 07/21/2011 4:56 PM Left message for S. Sloan as answering greeting was identified Date and time of second attempt: 07/22/2011 6:22 PM   [name] 07/22/2011, 6:22 PM

## 2011-07-21 NOTE — Progress Notes (Addendum)
D: Pt denies SI/HI/AV. Pt is pleasant and cooperative. Pt rates depression at a 8, and Helplessness/hopelessness at a 6. Pt complained of of diarrhea.   A: Pt was offered support and encouragement. Pt was given scheduled medications and as well as imodium. Pt was encourage to attend groups. Q 15 minute checks were done for safety.  R:Pt attends groups and interacts well with peers and staff. Pt is taking medication. Pt stated that imodium did help with his diarrhea.Pt receptive to treatment and safety maintained on unit.

## 2011-07-21 NOTE — Progress Notes (Signed)
Adult Psychosocial Assessment Update Interdisciplinary Team  Previous Behavior Health Hospital admissions/discharges:  Admissions Discharges  Date:   07/04/11 Date:    07/12/11  Date:  01/01/11 Date:     01/08/11  Date: Date:  Date: Date:  Date: Date:   Changes since the last Psychosocial Assessment (including adherence to outpatient mental health and/or substance abuse treatment, situational issues contributing to decompensation and/or relapse).  [name] reports he did not keep [facility] appointment that he had originally requested    when discharged earlier this month on 6/11.  Reports he did visit mother upon dis-    Charge and is grateful for that as she died 08-11-2011.  Upon learning of her death [name]   relapsed on alcohol, consuming 16 40 ounce beers over 18 hour period.        Discharge Plan 1. Will you be returning to the same living situation after discharge?   Yes: No:  X    If no, what is your plan?      Patient reports he will not be returning to where he has been staying. Considering   offer from Aunt and Uncle who are "strict, no alcohol in there home" or employer of 15 years who also has clean sober home.      2. Would you like a referral for services when you are discharged? Yes:  X   If yes, for what services?  No:        Patient follows up at [facility]       Summary and Recommendations (to be completed by the evaluator)  Patient is 46 YO caucasian male admitted with diagnosis of Major Depression,    Recurrent severe and Alcohol Dependence. Patient reports 24 hour relapse on alcohol    upon news of mother's death and suicidal ideation.  Patient will benefit from crisis    stabilization, medication evaluation, group therapy and psycho ed groups, in addition to    case management for discharge planning.               Signature:  [name] 07/21/2011 12:50 PM

## 2011-07-21 NOTE — Progress Notes (Signed)
[facility] MD Progress Note  07/21/2011 3:33 PM  S: "I felt restless last night. But I"m a little bit better than I was yesterday. I still have not started the alcohol withdrawal symptoms yet. I'm still going through the crisis of learning about my mother's death. She was 16, may be not a spring chicken, but I still will miss her. She was my mama"  Diagnosis:   Axis I: Psychoactive substance-induced organic mood disorder, Alcohol abuse/dependence Axis II: Deferred Axis III:  Past Medical History  Diagnosis Date  . Hypertension   . Alcoholism /alcohol abuse    Axis IV: other psychosocial or environmental problems Axis V: 51-60 moderate symptoms  ADL's:  Intact  Sleep: Good  Appetite:  Fair  Suicidal Ideation:  Plan:  No Intent:  No Means:  No Homicidal Ideation:  Plan:  No Intent:  no Means:  No  AEB (as evidenced by): Per patient's reports  Mental Status Examination/Evaluation: Objective:  Appearance: Casual  Eye Contact::  Good  Speech:  Clear and Coherent  Volume:  Normal  Mood:  Depressed  Affect:  Flat  Thought Process:  Coherent  Orientation:  Full  Thought Content:  Rumination  Suicidal Thoughts:  No  Homicidal Thoughts:  No  Memory:  Immediate;   Good Recent;   Good Remote;   Good  Judgement:  Poor  Insight:  Fair  Psychomotor Activity:  Normal  Concentration:  Fair  Recall:  Good  Akathisia:  No  Handed:  Right  AIMS (if indicated):     Assets:  Desire for Improvement  Sleep:  Number of Hours: 6.75    Vital Signs:Blood pressure 129/72, pulse 52, temperature 98.5 F (36.9 C), temperature source Oral, resp. rate 18, height 5\' 7"  (1.702 m), weight 80 kg (176 lb 5.9 oz), SpO2 97.00%. Current Medications: Current Facility-Administered Medications  Medication Dose Route Frequency Provider Last Rate Last Dose  . acetaminophen (TYLENOL) tablet 650 mg  650 mg Oral Q6H PRN Wonda Cerise, MD      . alum & mag hydroxide-simeth (MAALOX/MYLANTA) 200-200-20 MG/5ML  suspension 30 mL  30 mL Oral Q4H PRN Wonda Cerise, MD      . ARIPiprazole (ABILIFY) tablet 5 mg  5 mg Oral Daily Alyson Kuroski-Mazzei, DO   5 mg at 07/21/11 0817  . chlordiazePOXIDE (LIBRIUM) capsule 25 mg  25 mg Oral Q6H PRN Sanjuana Kava, NP      . chlordiazePOXIDE (LIBRIUM) capsule 25 mg  25 mg Oral QID Sanjuana Kava, NP   25 mg at 07/21/11 1155   Followed by  . chlordiazePOXIDE (LIBRIUM) capsule 25 mg  25 mg Oral TID Sanjuana Kava, NP       Followed by  . chlordiazePOXIDE (LIBRIUM) capsule 25 mg  25 mg Oral BH-qamhs Sanjuana Kava, NP       Followed by  . chlordiazePOXIDE (LIBRIUM) capsule 25 mg  25 mg Oral Daily Sanjuana Kava, NP      . chlordiazePOXIDE (LIBRIUM) capsule 50 mg  50 mg Oral Once Sanjuana Kava, NP      . citalopram (CELEXA) tablet 30 mg  30 mg Oral Daily Alyson Kuroski-Mazzei, DO   30 mg at 07/21/11 0817  . divalproex (DEPAKOTE ER) 24 hr tablet 750 mg  750 mg Oral Daily Alyson Kuroski-Mazzei, DO   750 mg at 07/21/11 0817  . hydrOXYzine (ATARAX/VISTARIL) tablet 25 mg  25 mg Oral Q6H PRN Sanjuana Kava, NP      .  loperamide (IMODIUM) capsule 2-4 mg  2-4 mg Oral PRN Sanjuana Kava, NP      . magnesium hydroxide (MILK OF MAGNESIA) suspension 30 mL  30 mL Oral Daily PRN Wonda Cerise, MD      . multivitamin with minerals tablet 1 tablet  1 tablet Oral Daily Sanjuana Kava, NP   1 tablet at 07/21/11 0816  . ondansetron (ZOFRAN-ODT) disintegrating tablet 4 mg  4 mg Oral Q6H PRN Sanjuana Kava, NP      . potassium chloride SA (K-DUR,KLOR-CON) CR tablet 20 mEq  20 mEq Oral BID Wonda Cerise, MD   20 mEq at 07/21/11 0817  . thiamine (B-1) injection 100 mg  100 mg Intramuscular Once Sanjuana Kava, NP      . thiamine (VITAMIN B-1) tablet 100 mg  100 mg Oral Daily Sanjuana Kava, NP   100 mg at 07/21/11 0817  . traZODone (DESYREL) tablet 100 mg  100 mg Oral QHS Alyson Kuroski-Mazzei, DO   100 mg at 07/20/11 2136    Lab Results:  Results for orders placed during the hospital encounter of 07/19/11  (from the past 48 hour(s))  ETHANOL     Status: Abnormal   Collection Time   07/20/11 12:30 AM      Component Value Range Comment   Alcohol, Ethyl (B) 175 (*) 0 - 11 mg/dL   COMPREHENSIVE METABOLIC PANEL     Status: Abnormal   Collection Time   07/20/11 12:30 AM      Component Value Range Comment   Sodium 133 (*) 135 - 145 mEq/L    Potassium 3.2 (*) 3.5 - 5.1 mEq/L    Chloride 96  96 - 112 mEq/L    CO2 26  19 - 32 mEq/L    Glucose, Bld 84  70 - 99 mg/dL    BUN 8  6 - 23 mg/dL    Creatinine, Ser 1.61  0.50 - 1.35 mg/dL    Calcium 9.2  8.4 - 09.6 mg/dL    Total Protein 7.1  6.0 - 8.3 g/dL    Albumin 3.8  3.5 - 5.2 g/dL    AST 20  0 - 37 U/L    ALT 14  0 - 53 U/L    Alkaline Phosphatase 61  39 - 117 U/L    Total Bilirubin 0.3  0.3 - 1.2 mg/dL    GFR calc non Af Amer >90  >90 mL/min    GFR calc Af Amer >90  >90 mL/min   CBC     Status: Abnormal   Collection Time   07/20/11 12:30 AM      Component Value Range Comment   WBC 5.2  4.0 - 10.5 K/uL    RBC 4.18 (*) 4.22 - 5.81 MIL/uL    Hemoglobin 13.6  13.0 - 17.0 g/dL    HCT 04.5 (*) 40.9 - 52.0 %    MCV 91.6  78.0 - 100.0 fL    MCH 32.5  26.0 - 34.0 pg    MCHC 35.5  30.0 - 36.0 g/dL    RDW 81.1  91.4 - 78.2 %    Platelets 216  150 - 400 K/uL   DIFFERENTIAL     Status: Abnormal   Collection Time   07/20/11 12:30 AM      Component Value Range Comment   Neutrophils Relative 38 (*) 43 - 77 %    Neutro Abs 2.0  1.7 - 7.7 K/uL  Lymphocytes Relative 48 (*) 12 - 46 %    Lymphs Abs 2.5  0.7 - 4.0 K/uL    Monocytes Relative 10  3 - 12 %    Monocytes Absolute 0.5  0.1 - 1.0 K/uL    Eosinophils Relative 3  0 - 5 %    Eosinophils Absolute 0.1  0.0 - 0.7 K/uL    Basophils Relative 1  0 - 1 %    Basophils Absolute 0.0  0.0 - 0.1 K/uL   URINE RAPID DRUG SCREEN (HOSP PERFORMED)     Status: Normal   Collection Time   07/20/11 12:36 AM      Component Value Range Comment   Opiates NONE DETECTED  NONE DETECTED    Cocaine NONE DETECTED  NONE  DETECTED    Benzodiazepines NONE DETECTED  NONE DETECTED    Amphetamines NONE DETECTED  NONE DETECTED    Tetrahydrocannabinol NONE DETECTED  NONE DETECTED    Barbiturates NONE DETECTED  NONE DETECTED     Physical Findings: AIMS:  , ,  ,  ,    CIWA:  CIWA-Ar Total: 0  COWS:     Treatment Plan Summary: Daily contact with patient to assess and evaluate symptoms and progress in treatment Medication management  Plan: Continue current treatment plan.  Armandina Stammer I 07/21/2011, 3:33 PM

## 2011-07-21 NOTE — Progress Notes (Signed)
07/21/2011         Time: 1415      Group Topic/Focus: The focus of this group is on discussing various styles of communication and communicating assertively using 'I' (feeling) statements.   Participation Level: Active  Participation Quality: Redirectable  Affect: Appropriate  Cognitive: Oriented  Additional Comments: Patient bright, joking with peers and staff, required some redirection for resisting group.  [name] 07/21/2011 3:46 PM

## 2011-07-22 NOTE — Discharge Planning (Signed)
[name] attended AM group, good participation.  States he wants to go to [facility], and is happy he has a screening on Tues, even though he knows it is not for immediate admission.  Plans to stay with aunt and uncle in the meantime.  Asks for d/c on Monday.

## 2011-07-22 NOTE — Progress Notes (Signed)
[facility] Group Notes:  (Counselor/Nursing/MHT/Case Management/Adjunct)  07/22/2011 6:24 PM  Type of Therapy:  Group Therapy at 11  Participation Level:  Did Not Attend  [facility] Group Notes:  (Counselor/Nursing/MHT/Case Management/Adjunct)  07/22/2011 6:24 PM  Type of Therapy:  Group Therapy at 1:15  Participation Level:  None  Participation Quality:  In attendance  Affect:  Flat, drowsy  Cognitive:  Oriented  Insight:  None  Engagement in Group:  None  Engagement in Therapy:  None   [name] 07/22/2011, 6:24 PM

## 2011-07-22 NOTE — Progress Notes (Signed)
Patient ID: [name], male   DOB: 1965-05-08, 46 y.o.   MRN: [id number] Pt is awake and active on the unit this AM. Pt denies SI/HI and A/V hallucinations. Pt is participating in the milieu and is cooperative with staff. Pt is attending groups. Pt shaved this AM and his mood is somewhat improved from yesterday. Writer will continue to monitor.

## 2011-07-22 NOTE — Progress Notes (Signed)
[facility] MD Progress Note                                         07/22/2011    [name] 1965/02/15    [id number] Hospital day #2  Dx: Alcoholism The patient was seen today and reports the following:  Sleep: OK Appetite: Improving  Mild>(1-10) >Severe  Hopelessness (1-10): Depression (1-10):  Anxiety (1-10):  Suicidal Ideation: .Denies Plan: None Intent: None Means:  None Homicidal Ideation: Denies Plan: None Intent: None Means: None  Eye Contact: Good.  General Appearance/Behavior: cooperative Motor Behavior: normal Speech: clear and goal directed  Mental Status: oriented x 3 Level of Consciousness:  alert Mood: depressed Affect: congruent  Thought Process: linear Thought Content: normal denies AH/VH Perception: intact  Judgment: fair Insight: lacking Cognition: at least average  Sleep: Number of Hours:     height is 5\' 7"  (1.702 m) and weight is 80 kg (176 lb 5.9 oz). His oral temperature is 98.4 F (36.9 C). His blood pressure is 109/54 and his pulse is 75. His respiration is 18 and oxygen saturation is 97%.   . ARIPiprazole  5 mg Oral Daily  . chlordiazePOXIDE  25 mg Oral QID   Followed by  . chlordiazePOXIDE  25 mg Oral TID   Followed by  . chlordiazePOXIDE  25 mg Oral BH-qamhs   Followed by  . chlordiazePOXIDE  25 mg Oral Daily  . chlordiazePOXIDE  50 mg Oral Once  . citalopram  30 mg Oral Daily  . divalproex  750 mg Oral Daily  . multivitamin with minerals  1 tablet Oral Daily  . potassium chloride  20 mEq Oral BID  . thiamine  100 mg Intramuscular Once  . thiamine  100 mg Oral Daily  . traZODone  100 mg Oral QHS    No results found for this or any previous visit (from the past 48 hour(s)).    ROS:    Constitutional: WDWN AAF NAD   GI: Negative for N,V,D,C   Neuro: Negative for dizziness, blurred vision, visual changes, headaches   Resp: Negative for wheezing, SOB, cough   Cardio: Negative for CP, diaphoresis, fatigue   MSK:  Negative for joint pain, swelling, DROM, or ambulatory difficulties.  Time was spent with the patient discussing his symptoms and he states he is doing well and anticipating going to [facility] on Tuesday.   Treatment Plan/Recommendations: Admit for safety and stabilization.  Review and reinstate ant pertinent home medications for other health issues.  Initiate Librium protocol for alcohol level of 175 on admission.  Obtain Depakote levels 07/24/11.  Continue current treatment plan.  Treatment Plan Summary:  Daily contact with patient to assess and evaluate symptoms and progress in treatment  Medication management   [name] T. Emrah Ariola PAC

## 2011-07-22 NOTE — Progress Notes (Signed)
D: Pt is in bed resting with eyes closed. Pt appears to be in no signs of distress at this time. Pt's respirations even and unlabored. A: Writer will continue to monitor pt. Q15min checks remains.  R: Pt remains safe at this time. 

## 2011-07-23 DIAGNOSIS — F1994 Other psychoactive substance use, unspecified with psychoactive substance-induced mood disorder: Secondary | ICD-10-CM

## 2011-07-23 DIAGNOSIS — T50904A Poisoning by unspecified drugs, medicaments and biological substances, undetermined, initial encounter: Secondary | ICD-10-CM

## 2011-07-23 NOTE — Progress Notes (Addendum)
[facility] Inpatient Family/Significant Other Suicide Prevention Education  Suicide Prevention Education:  Education Completed; employer of 15 years, [employer] at [phone] has been identified by the patient as the family member/significant other with whom the patient will be residing, and identified as the person(s) who will aid the patient in the event of a mental health crisis (suicidal ideations/suicide attempt).  With written consent from the patient, the family member/significant other has been provided the following suicide prevention education, prior to the and/or following the discharge of the patient.  The suicide prevention education provided includes the following:  Suicide risk factors  Suicide prevention and interventions  National Suicide Hotline telephone number  [facility] assessment telephone number  [facility] Emergency Assistance 911  [facility] and/or Residential Mobile Crisis Unit telephone number  Request made of family/significant other to:  Remove weapons (e.g., guns, rifles, knives), all items previously/currently identified as safety concern.    Remove drugs/medications (over-the-counter, prescriptions, illicit drugs), all items previously/currently identified as a safety concern.  The family member/significant other verbalizes understanding of the suicide prevention education information provided.  The family member/significant other agrees to remove the items of safety concern listed above.  Brett Canales stated he can pick up the pt monday around 1PM to take him Home. He wants to support the pt but states that he knows the pt keeps "sliiping" and relapsing and is not sure how to help. Pt can return to work.  [facility] 07/23/2011, 3:55 PM

## 2011-07-23 NOTE — Progress Notes (Signed)
Pt resting in bed with eyes closed. RR WNL, even and unlabored. Audible snoring. Level III obs in place. Pt safe. Lawrence Marseilles

## 2011-07-23 NOTE — Progress Notes (Signed)
Patient ID: [name], male   DOB: 12-28-1965, 45 y.o.   MRN: [id number]  Pt. attended and participated in aftercare planning group. Pt. accepted information on suicide prevention, warning signs to look for with suicide and crisis line numbers to use. Pt. listed their current anxiety level as 8 and their current depression level as 9. Pt. shared that he has been going through a rough time since losing his mother. Pt. was very anxious about contacting his employer regarding his hospital stay.

## 2011-07-23 NOTE — Progress Notes (Signed)
D) Patient pleasant and cooperative upon my assessment. Patient denies SI/HI, denies A/V hallucinations. Patient did attend AA group this evening. Patient became tearful after group, patient verbalizes "I let my ten year old son down, I'm not doing right by my son and my wife." Patient verbalizes feeling depressed.  A) Patient offered support and encouragement. Patient did seem to brighten after conversation and encouragement from nursing staff.    R)  Patient active on unit, attending groups.  Patient remains safe on unit with Q15 minute checks for safety. Will continue to monitor.

## 2011-07-23 NOTE — Progress Notes (Signed)
Patient ID: [name], male   DOB: 11/23/1965, 46 y.o.   MRN: [id number] Pt is awake and active on the unit this AM. Pt denies SI/HI and A/V hallucinations. Pt is participating in the milieu and is cooperative with staff. Pt is attending groups and reports that he is feeling much better. Pt mood and affect are also improved. Pt acknowledges the need for continued participation in AA after discharge. Writer offered emotional support and will continue to monitor.

## 2011-07-23 NOTE — Progress Notes (Signed)
[facility] MD Progress Note  07/23/2011 12:14 PM  Diagnosis:   Axis I: Alcohol Abuse, Anxiety Disorder NOS and Substance Induced Mood Disorder Axis II: Deferred Axis III:  Past Medical History  Diagnosis Date  . Hypertension   . Alcoholism /alcohol abuse    Subjective: [name] was anxious to talk to this provider today, and asked if he could have his shoes as his ankles are bothering him. He denies any cravings for alcohol today. He denies any suicidal or homicidal ideation. He denies any auditory or visual hallucinations. He reports that he slept well last night. He also reports that his appetite is good. He rates his depression has a 4 on a scale of 1-10, and his anxiety as a 9 on a scale of 1-10. He reports that his anxiety is better managed when he is able to stay busy.  ADL's:  Intact  Sleep: Good  Appetite:  Good  Suicidal Ideation:  Patient denies any thought, plan, or intent Homicidal Ideation:  Patient denies any thought, plan, or intent  AEB (as evidenced by):  Mental Status Examination/Evaluation: Objective:  Appearance: Casual and Fairly Groomed  Eye Contact::  Good  Speech:  Clear and Coherent  Volume:  Increased  Mood:  Anxious  Affect:  Congruent  Thought Process:  Logical  Orientation:  Full  Thought Content:  WDL  Suicidal Thoughts:  No  Homicidal Thoughts:  No  Memory:  Immediate;   Good  Judgement:  Fair  Insight:  Fair  Psychomotor Activity:  Normal  Concentration:  Good  Recall:  Good  Akathisia:  No  Handed:    AIMS (if indicated):     Assets:  Desire for Improvement  Sleep:  Number of Hours: 6.5    Vital Signs:Blood pressure 128/76, pulse 66, temperature 98.6 F (37 C), temperature source Oral, resp. rate 16, height 5\' 7"  (1.702 m), weight 80 kg (176 lb 5.9 oz), SpO2 97.00%. Current Medications: Current Facility-Administered Medications  Medication Dose Route Frequency Provider Last Rate Last Dose  . acetaminophen (TYLENOL) tablet 650 mg  650 mg  Oral Q6H PRN Wonda Cerise, MD   650 mg at 07/22/11 2154  . alum & mag hydroxide-simeth (MAALOX/MYLANTA) 200-200-20 MG/5ML suspension 30 mL  30 mL Oral Q4H PRN Wonda Cerise, MD      . ARIPiprazole (ABILIFY) tablet 5 mg  5 mg Oral Daily Alyson Kuroski-Mazzei, DO   5 mg at 07/23/11 4098  . chlordiazePOXIDE (LIBRIUM) capsule 25 mg  25 mg Oral Q6H PRN Sanjuana Kava, NP      . chlordiazePOXIDE (LIBRIUM) capsule 25 mg  25 mg Oral TID Sanjuana Kava, NP   25 mg at 07/22/11 1715   Followed by  . chlordiazePOXIDE (LIBRIUM) capsule 25 mg  25 mg Oral BH-qamhs Sanjuana Kava, NP   25 mg at 07/23/11 1191   Followed by  . chlordiazePOXIDE (LIBRIUM) capsule 25 mg  25 mg Oral Daily Sanjuana Kava, NP      . chlordiazePOXIDE (LIBRIUM) capsule 50 mg  50 mg Oral Once Sanjuana Kava, NP      . citalopram (CELEXA) tablet 30 mg  30 mg Oral Daily Alyson Kuroski-Mazzei, DO   30 mg at 07/23/11 0807  . divalproex (DEPAKOTE ER) 24 hr tablet 750 mg  750 mg Oral Daily Alyson Kuroski-Mazzei, DO   750 mg at 07/23/11 4782  . hydrOXYzine (ATARAX/VISTARIL) tablet 25 mg  25 mg Oral Q6H PRN Sanjuana Kava, NP   25  mg at 07/22/11 2154  . loperamide (IMODIUM) capsule 2-4 mg  2-4 mg Oral PRN Sanjuana Kava, NP   4 mg at 07/22/11 1314  . magnesium hydroxide (MILK OF MAGNESIA) suspension 30 mL  30 mL Oral Daily PRN Wonda Cerise, MD      . multivitamin with minerals tablet 1 tablet  1 tablet Oral Daily Sanjuana Kava, NP   1 tablet at 07/23/11 0807  . ondansetron (ZOFRAN-ODT) disintegrating tablet 4 mg  4 mg Oral Q6H PRN Sanjuana Kava, NP      . potassium chloride SA (K-DUR,KLOR-CON) CR tablet 20 mEq  20 mEq Oral BID Wonda Cerise, MD   20 mEq at 07/23/11 0807  . thiamine (B-1) injection 100 mg  100 mg Intramuscular Once Sanjuana Kava, NP      . thiamine (VITAMIN B-1) tablet 100 mg  100 mg Oral Daily Sanjuana Kava, NP   100 mg at 07/23/11 0807  . traZODone (DESYREL) tablet 100 mg  100 mg Oral QHS Alyson Kuroski-Mazzei, DO   100 mg at 07/22/11 2152     Lab Results: No results found for this or any previous visit (from the past 48 hour(s)).  Physical Findings: AIMS:  , ,  ,  ,    CIWA:  CIWA-Ar Total: 3  COWS:     Treatment Plan Summary: Daily contact with patient to assess and evaluate symptoms and progress in treatment Medication management  Plan: We will continue his current plan of care and monitor him closely. [name] 07/23/2011, 12:14 PM

## 2011-07-23 NOTE — Progress Notes (Signed)
Patient ID: [name], male   DOB: [birth year], 46 y.o.   MRN: [id number]  [facility] Group Notes:  (Counselor/Nursing/MHT/Case Management/Adjunct)  07/23/2011 1:15 PM  Type of Therapy:  Group Therapy, Dance/Movement Therapy   Participation Level:  None  Participation Quality:  Drowsy  Affect:  Appropriate  Cognitive:  Appropriate   Insight:  None  Engagement in Group:  None  Engagement in Therapy:  None  Modes of Intervention:  Clarification, Problem-solving, Role-play, Socialization and Support  Summary of Progress/Problems: Pt. attended group.  Pt. Indicated that he was drowsy due to the medication.  Pt. would fall asleep off and on.    Rhunette Croft

## 2011-07-24 NOTE — Progress Notes (Signed)
Patient ID: [name], male   DOB: Apr 01, 1965, 46 y.o.   MRN: [id number]  Pt. attended and participated in aftercare planning group. Pt. accepted information on suicide prevention, warning signs to look for with suicide and crisis line numbers to use. Pt. listed their current anxiety level as 4 and their current depression level as 3. Pt. stated that he is feeling much better today and that his medications are working.

## 2011-07-24 NOTE — Progress Notes (Signed)
Patient ID: [name], male   DOB: 03/24/1965, 46 y.o.   MRN: [id number]   [facility] Group Notes:  (Counselor/Nursing/MHT/Case Management/Adjunct)  07/24/2011 1:15 PM  Type of Therapy:  Group Therapy, Dance/Movement Therapy   Participation Level:  None  Participation Quality:  Drowsy  Affect:  Irritable  Cognitive:  Appropriate  Insight:  None  Engagement in Group:  None  Engagement in Therapy:  None  Modes of Intervention:  Clarification, Problem-solving, Role-play, Socialization and Support  Summary of Progress/Problems: Group discussed the "I Am Your Disease" letter. Group members shared how they could relate to the letter and what supports they could use to aid in their recovery. Pt. was irritable during the beginning of group and stated that he is ready to leave. Pt. was drowsy during group and did not share.     Cassidi Long

## 2011-07-24 NOTE — Progress Notes (Signed)
[facility] MD Progress Note  07/24/2011 10:11 AM  Diagnosis:   Axis I: Alcohol Abuse, Anxiety Disorder NOS and Substance Induced Mood Disorder Axis II: Deferred Axis III:  Past Medical History  Diagnosis Date  . Hypertension   . Alcoholism /alcohol abuse    Subjective: Ha reports that he is doing well overall today. He does complain of some lightheadedness and dizziness, but denies that he feels that he will lose consciousness or fall. He denies any suicidal or homicidal ideation. He denies any withdrawal or cravings. He denies any auditory or visual hallucinations. He states that he slept well last night, and that his appetite is improving. He expresses a desire to go to [facility] for residential treatment after discharge, and plans to leave tomorrow (07/25/2011) afternoon, and that his boss will be here to pick him up at 1. He plans to stay with his boss tomorrow night, and his boss will taken to [facility] on Tuesday (07/26/2011).  ADL's:  Intact  Sleep: Good  Appetite:  Good  Suicidal Ideation:  Patient denies any thought, plan, or intent Homicidal Ideation:  Patient denies any thought, plan, or intent  AEB (as evidenced by):  Mental Status Examination/Evaluation: Objective:  Appearance: Casual  Eye Contact::  Fair  Speech:  Clear and Coherent  Volume:  Normal  Mood:  Anxious  Affect:  Congruent  Thought Process:  Logical  Orientation:  Full  Thought Content:  WDL  Suicidal Thoughts:  No  Homicidal Thoughts:  No  Memory:  Immediate;   Good  Judgement:  Fair  Insight:  Fair  Psychomotor Activity:  Normal  Concentration:  Good  Recall:  Good  Akathisia:  No  Handed:    AIMS (if indicated):     Assets:  Desire for Improvement Social Support  Sleep:  Number of Hours: 4    Vital Signs:Blood pressure 105/63, pulse 67, temperature 98.4 F (36.9 C), temperature source Oral, resp. rate 18, height 5\' 7"  (1.702 m), weight 80 kg (176 lb 5.9 oz), SpO2 97.00%. Current  Medications: Current Facility-Administered Medications  Medication Dose Route Frequency Provider Last Rate Last Dose  . acetaminophen (TYLENOL) tablet 650 mg  650 mg Oral Q6H PRN Wonda Cerise, MD   650 mg at 07/22/11 2154  . alum & mag hydroxide-simeth (MAALOX/MYLANTA) 200-200-20 MG/5ML suspension 30 mL  30 mL Oral Q4H PRN Wonda Cerise, MD      . ARIPiprazole (ABILIFY) tablet 5 mg  5 mg Oral Daily Alyson Kuroski-Mazzei, DO   5 mg at 07/24/11 0826  . chlordiazePOXIDE (LIBRIUM) capsule 25 mg  25 mg Oral Q6H PRN Sanjuana Kava, NP      . chlordiazePOXIDE (LIBRIUM) capsule 25 mg  25 mg Oral BH-qamhs Sanjuana Kava, NP   25 mg at 07/23/11 2120   Followed by  . chlordiazePOXIDE (LIBRIUM) capsule 25 mg  25 mg Oral Daily Sanjuana Kava, NP   25 mg at 07/24/11 0826  . chlordiazePOXIDE (LIBRIUM) capsule 50 mg  50 mg Oral Once Sanjuana Kava, NP      . citalopram (CELEXA) tablet 30 mg  30 mg Oral Daily Alyson Kuroski-Mazzei, DO   30 mg at 07/24/11 0826  . divalproex (DEPAKOTE ER) 24 hr tablet 750 mg  750 mg Oral Daily Alyson Kuroski-Mazzei, DO   750 mg at 07/24/11 0826  . hydrOXYzine (ATARAX/VISTARIL) tablet 25 mg  25 mg Oral Q6H PRN Sanjuana Kava, NP   25 mg at 07/22/11 2154  . loperamide (IMODIUM)  capsule 2-4 mg  2-4 mg Oral PRN Sanjuana Kava, NP   4 mg at 07/22/11 1314  . magnesium hydroxide (MILK OF MAGNESIA) suspension 30 mL  30 mL Oral Daily PRN Wonda Cerise, MD      . multivitamin with minerals tablet 1 tablet  1 tablet Oral Daily Sanjuana Kava, NP   1 tablet at 07/24/11 0826  . ondansetron (ZOFRAN-ODT) disintegrating tablet 4 mg  4 mg Oral Q6H PRN Sanjuana Kava, NP      . potassium chloride SA (K-DUR,KLOR-CON) CR tablet 20 mEq  20 mEq Oral BID Wonda Cerise, MD   20 mEq at 07/24/11 0826  . thiamine (B-1) injection 100 mg  100 mg Intramuscular Once Sanjuana Kava, NP      . thiamine (VITAMIN B-1) tablet 100 mg  100 mg Oral Daily Sanjuana Kava, NP   100 mg at 07/24/11 0826  . traZODone (DESYREL) tablet 100 mg  100  mg Oral QHS Alyson Kuroski-Mazzei, DO   100 mg at 07/23/11 2119    Lab Results: No results found for this or any previous visit (from the past 48 hour(s)).  Physical Findings: AIMS:  , ,  ,  ,    CIWA:  CIWA-Ar Total: 1  COWS:     Treatment Plan Summary: Daily contact with patient to assess and evaluate symptoms and progress in treatment Medication management  Plan: We will continue his current plan of care, and monitor him closely. We will proceed with discharge plans, and he will present to [facility] for a screening on 07/26/2011.  [name] 07/24/2011, 10:11 AM

## 2011-07-24 NOTE — Progress Notes (Signed)
Pt has been anxious and labile at times overnight. Frequent needs. He is insistent on specific details regarding his discharge and repeats himself as if forgetting previous conversations with staff. Reassured more than once that he would be seen today by provider and would have notice prior to his discharge whenever that may occur (pt would like to leave Monday as he has previously requested). Pt apologetic after raising his voice, appreciative of support. [name]

## 2011-07-24 NOTE — Progress Notes (Signed)
Patient ID: [name], male   DOB: 11-12-1965, 46 y.o.   MRN: [id number] Pt is awake and active on the unit this AM. Pt denies SI/HI and A/V hallucinations. Pt is participating in the milieu and is cooperative with staff. Pt mood is somewhat depressed but overall has improved in recent days. Pt is attending groups and states that he feels better and is committed to his aftercare plan. Writer will continue to monitor.

## 2011-07-24 NOTE — Progress Notes (Signed)
Patient ID: [name], male   DOB: 1965-05-03, 46 y.o.   MRN: [id number] Pt pleasant and animated on approach; pt states he is excited about leaving in the am. Pt states he is going to follow with [facility] to ensure success. Pt denies SI or plans to harm himself. Will continue to monitor pt.

## 2011-07-25 DIAGNOSIS — F1020 Alcohol dependence, uncomplicated: Principal | ICD-10-CM

## 2011-07-25 MED ORDER — CITALOPRAM HYDROBROMIDE 10 MG PO TABS
30.0000 mg | ORAL_TABLET | Freq: Every day | ORAL | Status: DC
  Filled 2011-07-25 (×2): qty 42

## 2011-07-25 MED ORDER — TRAZODONE HCL 100 MG PO TABS: 100.0000 mg | ORAL_TABLET | Freq: Every day | ORAL | Status: DC

## 2011-07-25 MED ORDER — ARIPIPRAZOLE 5 MG PO TABS: 5.0000 mg | ORAL_TABLET | Freq: Every day | ORAL | Status: DC

## 2011-07-25 MED ORDER — POTASSIUM CHLORIDE CRYS ER 20 MEQ PO TBCR: 20.0000 meq | EXTENDED_RELEASE_TABLET | Freq: Two times a day (BID) | ORAL | Status: DC

## 2011-07-25 MED ORDER — DIVALPROEX SODIUM ER 250 MG PO TB24
750.0000 mg | ORAL_TABLET | Freq: Every day | ORAL | Status: DC
  Filled 2011-07-25: qty 42

## 2011-07-25 MED ORDER — DIVALPROEX SODIUM ER 250 MG PO TB24: 750.0000 mg | ORAL_TABLET | Freq: Every day | ORAL | Status: DC

## 2011-07-25 MED ORDER — CITALOPRAM HYDROBROMIDE 10 MG PO TABS: 30.0000 mg | ORAL_TABLET | Freq: Every day | ORAL | Status: DC

## 2011-07-25 NOTE — Discharge Instructions (Signed)
Attend 180 meetings in 90 days. Get trusted sponsor from the advise of others or from whomever in meetings seems to make sense, has a proven track record, will hold you responsible for your sobriety, and both expects and insists on total abstinence.  Work the steps HONESTLY with the trusted sponsor. Get obsessed with your recovery by often reminding yourself of how DEADLY this dredged horrible disease of addiction JUST IS. Focus the first month on speaker meetings where you specifically look at how your life has been wrecked by drugs/alcohol and how your life has been similar to that of the speakers.  For what is believed to be brain damage related to alcohol, it advised that you get  regular exercise, regular sleep, and  consume good quality, fish oil, 1000 mg twice a day. These 3 things are the foundation of rehabilitating your brain. If memory is a problem then INSTEAD of the fish oil mentioned above, try using Brain Power Basics from [employer].  You can order online or by phone [phone]. It costs $99 for the first month, and $80 monthly thereafter, but that investment in your brain and the recovery of your brain proper functioning would seem worth it.  SAMe 200mg  at night might help with getting to sleep or pushing to 400mg  at night might help with getting to sleep.  It also may help with managing stress, anxiety, or depression.   [employer] brand works.  Marland KitchenAnti whatever Alphabet (Where whatever stands for depression, anxiety, pain or BS in your life.)  A lternate between completely different solutions  B ehold beauty  C ommune with nature  D isplay affection thru hugs, words, understanding or Dance to new/different music  E at healthy food (like Fish Oil)  F eed wildlife  G o fishing  H ike in the woods or H unt down someone who has successfully met similar chalanges  I nspire someone else  [employer] or J ump into a new hobby  K eep a diary of your successes  L isten to  soothing music or L augh at yourself  [employer] music/ art/ poetry/ crafts  N otice something different about yourself, others, how others interact/ respond to each other, nature  O utside of yourself and typical way of doing/ thinking  P ick flowers  R andom acts of kindness without being discovered  S pend time on yourself/ Skin for beauty sake nails, teeth, hair, Soak in tub  T alk to a friend  U se grandma's ideas on how to handle things  V ary your routine  W alk  X tend your comfort zone where other's have invited you to go  Y oga/ other forms of meditation  Z ip up and go outside (or go outside of yourself)  Keep the crisis line phone with you at all times.

## 2011-07-25 NOTE — Progress Notes (Signed)
[facility] MD Progress Note  07/25/2011 12:28 PM  Diagnosis:   Axis I: Alcohol Abuse, Anxiety Disorder NOS and Substance Induced Mood Disorder Axis II: Deferred Axis III:  Past Medical History  Diagnosis Date  . Hypertension   . Alcoholism /alcohol abuse    Subjective: [name] has recently suffered the loss of his mother.  He relapsed and he now plans to go with his boss and stay with his boss until 0800 tomorrow AM when he will be dropped off at [facility]. This is a good plan for him.  ADL's:  Intact  Sleep: Good  Appetite:  Good  Suicidal Ideation:  Patient denies any thought, plan, or intent Homicidal Ideation:  Patient denies any thought, plan, or intent  AEB (as evidenced by):  Mental Status Examination/Evaluation: Objective:  Appearance: Casual  Eye Contact::  Fair  Speech:  Clear and Coherent  Volume:  Normal  Mood:  Anxious  Affect:  Congruent  Thought Process:  Logical  Orientation:  Full  Thought Content:  WDL  Suicidal Thoughts:  No  Homicidal Thoughts:  No  Memory:  Immediate;   Good  Judgement:  Fair  Insight:  Fair  Psychomotor Activity:  Normal  Concentration:  Good  Recall:  Good  Akathisia:  No  Handed:    AIMS (if indicated):     Assets:  Desire for Improvement Social Support  Sleep:  Number of Hours: 5.75    Vital Signs:Blood pressure 119/74, pulse 66, temperature 97.4 F (36.3 C), temperature source Oral, resp. rate 24, height 5\' 7"  (1.702 m), weight 80 kg (176 lb 5.9 oz), SpO2 97.00%. Current Medications: Current Facility-Administered Medications  Medication Dose Route Frequency Provider Last Rate Last Dose  . acetaminophen (TYLENOL) tablet 650 mg  650 mg Oral Q6H PRN Wonda Cerise, MD   650 mg at 07/22/11 2154  . alum & mag hydroxide-simeth (MAALOX/MYLANTA) 200-200-20 MG/5ML suspension 30 mL  30 mL Oral Q4H PRN Wonda Cerise, MD      . ARIPiprazole (ABILIFY) tablet 5 mg  5 mg Oral Daily Alyson Kuroski-Mazzei, DO   5 mg at 07/25/11 4782  .  chlordiazePOXIDE (LIBRIUM) capsule 50 mg  50 mg Oral Once Sanjuana Kava, NP      . citalopram (CELEXA) tablet 30 mg  30 mg Oral Daily Alyson Kuroski-Mazzei, DO   30 mg at 07/25/11 0808  . divalproex (DEPAKOTE ER) 24 hr tablet 750 mg  750 mg Oral Daily Alyson Kuroski-Mazzei, DO   750 mg at 07/25/11 9562  . magnesium hydroxide (MILK OF MAGNESIA) suspension 30 mL  30 mL Oral Daily PRN Wonda Cerise, MD      . multivitamin with minerals tablet 1 tablet  1 tablet Oral Daily Sanjuana Kava, NP   1 tablet at 07/25/11 1308  . potassium chloride SA (K-DUR,KLOR-CON) CR tablet 20 mEq  20 mEq Oral BID Wonda Cerise, MD   20 mEq at 07/25/11 0808  . thiamine (B-1) injection 100 mg  100 mg Intramuscular Once Sanjuana Kava, NP      . thiamine (VITAMIN B-1) tablet 100 mg  100 mg Oral Daily Sanjuana Kava, NP   100 mg at 07/25/11 6578  . traZODone (DESYREL) tablet 100 mg  100 mg Oral QHS Alyson Kuroski-Mazzei, DO   100 mg at 07/24/11 2136    Lab Results:  Results for orders placed during the hospital encounter of 07/20/11 (from the past 48 hour(s))  VALPROIC ACID LEVEL     Status: Abnormal  Collection Time   07/24/11  6:55 AM      Component Value Range Comment   Valproic Acid Lvl 25.9 (*) 50.0 - 100.0 ug/mL     Physical Findings: AIMS:  , ,  ,  ,    CIWA:  CIWA-Ar Total: 1  COWS:     Treatment Plan Summary: Daily contact with patient to assess and evaluate symptoms and progress in treatment Medication management  Plan: Pt has met his inpatient detox goals and is ready to shift to [facility] for rehab. Will D/C today.  Sebasthian Stailey 07/25/2011, 12:28 PM

## 2011-07-25 NOTE — Treatment Plan (Signed)
Interdisciplinary Treatment Plan Update (Adult)  Date: 07/25/2011  Time Reviewed: 6:13 PM   Progress in Treatment: Attending groups: Yes Participating in groups: Yes Taking medication as prescribed: Yes Tolerating medication: Yes   Family/Significant othe contact made:   Patient understands diagnosis:  Yes Discussing patient identified problems/goals with staff:  Yes Medical problems stabilized or resolved:  Yes Denies suicidal/homicidal ideation: Yes In tx team Issues/concerns per patient self-inventory:  Yes  Anxiety a 9 Other:  New problem(s) identified: N/A  Reason for Continuation of Hospitalization: Other; describe d/c today  Interventions implemented related to continuation of hospitalization:   Additional comments:  Estimated length of stay:D/C today  Discharge Plan:see below  New goal(s): N/A  Review of initial/current patient goals per problem list:   1.  Goal(s):Stabilize mood  Met:  Yes  Target date:6/24  As evidenced ZO:XWRU still gets tearful when talking about the recent loss of his mother, and he rates his anxiety about leaving as high [9].  He also states he feels much improved from when he came in, and that he feels he will continue to make good progress in these areas  2.  Goal (s):Eliminate SI  Met:  Yes  Target date:6/24  As evidenced EA:VWUJ denies SI in tx team and on self assessment   3.  Goal(s):Safely detox from alcohol  Met:  Yes  Target date:6/24  As evidenced by:Stable vitals, CIWA score of 0   4.  Goal(s):Get a screening appointment at [facility]  Met:  Yes  Target date:6/24  As evidenced WJ:[id number] of appointment tomorrow  Attendees: Patient:  [name] 07/25/2011 6:13 PM  Family:     Physician:  Orson Aloe 07/25/2011 6:13 PM   Nursing: Robbie Louis   07/25/2011 6:13 PM   Case Manager:  Richelle Ito, LCSW 07/25/2011 6:13 PM   Counselor:  Ronda Fairly, LCSWA 07/25/2011 6:13 PM   Other:  Serena Colonel 07/25/2011  6:13 PM  Other:     Other:     Other:      Scribe for Treatment Team:   Ida Rogue, 07/25/2011 6:13 PM

## 2011-07-25 NOTE — Progress Notes (Signed)
[name] Case Management Discharge Plan:  Will you be returning to the same living situation after discharge: No. At discharge, do you have transportation home?:Yes,  boss Do you have the ability to pay for your medications:Yes,  mental health  Interagency Information:     Release of information consent forms completed and in the chart;  Patient's signature needed at discharge.  Patient to Follow up at:  Follow-up Information    Follow up with [facility]  on 07/26/2011. (8 AM with [name])    Contact information:   [address] [place], [place] [address] [phone]      Follow up with [facility] on 07/28/2011. (Walk in Thursday morning at 8:00 for your hospital follow up appointment.  This is an essential appointment as this is where you can see the Dr for your medications.  When you are admitted to [facility], you will need to go with at least 1 week supply of me)    Contact information:   [address] [name]  [phone]         Patient denies SI/HI:   Yes,  yes    Safety Planning and Suicide Prevention discussed:  Yes,  yes  Barrier to discharge identified:No.  Summary and Recommendations:   [name] 07/25/2011, 6:11 PM

## 2011-07-25 NOTE — Discharge Summary (Signed)
Physician Discharge Summary Note  Patient:  [name] is an 46 y.o., male MRN:  [id number] DOB:  1965-12-01 Patient phone:  [phone] (home)  Patient address:   [address] [place] [address],   Date of Admission:  07/20/2011 Date of Discharge: 07/25/11  Reason for Admission: Alcohol detox  Discharge Diagnoses: Active Problems:  * No active [name] problems. *    Axis Diagnosis:   AXIS I:  Psychoactive substance-induced organic mood disorder, Alcohol abuse/alcoholism  AXIS II:  Deferred AXIS III:   Past Medical History  Diagnosis Date  . Hypertension   . Alcoholism /alcohol abuse    AXIS IV:  other psychosocial or environmental problems AXIS V:  65  Level of Care:  [name]  [name] Course: History of Present Illness: This is one of numerous admissions in this Behavioral health [name] for this 46 year old Caucasian male. Each of his previous [name] admissions were for alcohol detoxification. He has also been to several other treatment centers for substance abuse treatment. Patient was discharged from this [name] on 07/12/11. Prior to his discharge, [name] declined a chance to go to the [name] for further substance abuse treatment. Rather, he chose to go home to his grand-mother, where he will stay and resume his work. Today, he is being admitted from the [name] ED with request for alcohol detox. Patient reports, "I'm going through a terrible crisis right now. I just lost my mother 3 days ago. She was 39 years old. She lived in [place]. When I got the news, I was distraught, and I hit the bottles again to cope. I know, I messed up, but that is what it is. The last time I drank was yesterday. I finished 16 bottles of the 40s all at once. I drank because that is the only way that I can handle the fact that I will not be able to attend my mother's funeral. I [name]'t think that I can handle it. I believe that I will blow up at the  funeral".  Upon admission to this [name], [name] was started on Librium protocol for alcohol detoxification. He also received medication management and monitoring for his depressive and mood symptoms. He was medicated with Abilify 5 mg for mood control, Citalopram 10 mg for depression, Depakote ER 250 mg for mood stabilization and Trazodone 100 mg for sleep. He tolerated his treatment regimen without significant adverse effects and or reactions noted.   He was enrolled in group counseling and activities to help learn coping technique to assist with grieving the recent passing of his mother. Patient gave a reason for his immediate relapse to drinking alcohol after being discharged from this [name] as the news of the sudden death of his mother. Patient met with the treatment team this am. His treatment regimen, symptoms and response to treatment discussed. Patient state that he feels great. He added that he is looking forward to going home. He declared that he learned to not let things absorb him, however, that his mother's death did affect him harshly more than anything that he had ever experienced in his life. He added that he can no longer blame his mother for giving him his first drink. She did that because she did not know any better herself.   Patient is currently discharged to follow-up with [name] in the morning to continue substance abuse care. He will meet with [name]. He will follow-up with [name] for medication management. He is aware that  this appointment with [name] is a walk-in between the hours of 08:00 am and 03: 00 Pm. The addresses, dates and times for these appointments provided for patient. He received 2 weeks worth samples of his discharge medications. He currently and adamantly denies suicidal, homicidal ideations, auditory, visual hallucinations, delusional thinking and or any withdrawal symptoms. Patient left [name] with all personal belongings in no apparent distress.  He was picked up by his boss.   Consults:  None  Significant Diagnostic Studies:  labs: Depakote levels, CBC with diff, CMP, Toxicology  Discharge Vitals:   Blood pressure 119/74, pulse 66, temperature 97.4 F (36.3 C), temperature source Oral, resp. rate 24, height 5\' 7"  (1.702 m), weight 80 kg (176 lb 5.9 oz), SpO2 97.00%.  Mental Status Exam: See Mental Status Examination and Suicide Risk Assessment completed by Attending Physician prior to discharge.  Discharge destination:  [name]  Is patient on multiple antipsychotic therapies at discharge:  No   Has Patient had three or more failed trials of antipsychotic monotherapy by history:  No  Recommended Plan for Multiple Antipsychotic Therapies: NA   Medication List  As of 07/25/2011  2:45 PM   TAKE these medications      Indication    ARIPiprazole 5 MG tablet   Commonly known as: ABILIFY   Take 1 tablet (5 mg total) by mouth daily. For mood control       citalopram 10 MG tablet   Commonly known as: CELEXA   Take 3 tablets (30 mg total) by mouth daily. For depression       divalproex 250 MG 24 hr tablet   Commonly known as: DEPAKOTE ER   Take 3 tablets (750 mg total) by mouth daily. For mood stabilization       traZODone 100 MG tablet   Commonly known as: DESYREL   Take 1 tablet (100 mg total) by mouth at bedtime. For sleep            Follow-up Information    Follow up with [name]  on 07/26/2011. (8 AM with [name])    Contact information:   [address] [place], [place] [address] [phone]      Follow up with [name] on 07/28/2011. (Walk in Thursday morning at 8:00 for your [name] follow up appointment.  This is an essential appointment as this is where you can see the Dr for your medications.  When you are admitted to [name], you will need to go with at least 1 week supply of me)    Contact information:   [address] [name]  [phone]         Follow-up recommendations:   Activity:  as tolerated Other:  Keep all scheduled follow-up appointments as recommended.  Comments:  Take all your medicines as prescribed. Report to your outpatient provider any adverse effects from medications promptly. Instructed patient to stay from from alcohol and other illegal drug use while on prescription medicines. In the event of worsening symptoms, instructed to call the crisis hot line, 911 and or go to the nearest ED. Follow with your primary care provider for your other health care problems and or needs.  SignedArmandina Stammer I 07/25/2011, 2:45 PM

## 2011-07-25 NOTE — [facility] Suicide Risk Assessment (Signed)
Suicide Risk Assessment  Discharge Assessment     Demographic factors:       Current Mental Status Per Nursing Assessment::   On Admission:    At Discharge:     Current Mental Status Per Physician:  Loss Factors:    Historical Factors:    Risk Reduction Factors:      Continued Clinical Symptoms:  Severe Anxiety and/or Agitation Bipolar Disorder:   Bipolar II Alcohol/Substance Abuse/Dependencies Previous Psychiatric Diagnoses and Treatments  Discharge Diagnoses:   AXIS I:  Alcohol Abuse, Bipolar, Depressed and Substance Induced Mood Disorder AXIS II:  Deferred AXIS III:   Past Medical History  Diagnosis Date  . Hypertension   . Alcoholism /alcohol abuse    AXIS IV:  other psychosocial or environmental problems and problems with primary support group AXIS V:  51-60 moderate symptoms  Cognitive Features That Contribute To Risk:  Closed-mindedness Thought constriction (tunnel vision)    Suicide Risk:  Minimal: No identifiable suicidal ideation.  Patients presenting with no risk factors but with morbid ruminations; may be classified as minimal risk based on the severity of the depressive symptoms  Diagnosis:   Axis I: Alcohol Abuse, Anxiety Disorder NOS and Substance Induced Mood Disorder Axis II: Deferred Axis III:  Past Medical History  Diagnosis Date  . Hypertension   . Alcoholism /alcohol abuse    Subjective: [name] has recently suffered the loss of his mother.  He relapsed and he now plans to go with his boss and stay with his boss until 0800 tomorrow AM when he will be dropped off at [facility]. This is a good plan for him.  ADL's:  Intact  Sleep: Good  Appetite:  Good  Suicidal Ideation:  Patient denies any thought, plan, or intent Homicidal Ideation:  Patient denies any thought, plan, or intent  AEB (as evidenced by):  Mental Status Examination/Evaluation: Objective:  Appearance: Casual  Eye Contact::  Fair  Speech:  Clear and Coherent    Volume:  Normal  Mood:  Anxious  Affect:  Congruent  Thought Process:  Logical  Orientation:  Full  Thought Content:  WDL  Suicidal Thoughts:  No  Homicidal Thoughts:  No  Memory:  Immediate;   Good  Judgement:  Fair  Insight:  Fair  Psychomotor Activity:  Normal  Concentration:  Good  Recall:  Good  Akathisia:  No  Handed:    AIMS (if indicated):     Assets:  Desire for Improvement Social Support  Sleep:  Number of Hours: 5.75    Vital Signs:Blood pressure 119/74, pulse 66, temperature 97.4 F (36.3 C), temperature source Oral, resp. rate 24, height 5\' 7"  (1.702 m), weight 80 kg (176 lb 5.9 oz), SpO2 97.00%. Current Medications: Current Facility-Administered Medications  Medication Dose Route Frequency Provider Last Rate Last Dose  . acetaminophen (TYLENOL) tablet 650 mg  650 mg Oral Q6H PRN Wonda Cerise, MD   650 mg at 07/22/11 2154  . alum & mag hydroxide-simeth (MAALOX/MYLANTA) 200-200-20 MG/5ML suspension 30 mL  30 mL Oral Q4H PRN Wonda Cerise, MD      . ARIPiprazole (ABILIFY) tablet 5 mg  5 mg Oral Daily Alyson Kuroski-Mazzei, DO   5 mg at 07/25/11 1610  . chlordiazePOXIDE (LIBRIUM) capsule 50 mg  50 mg Oral Once Sanjuana Kava, NP      . citalopram (CELEXA) tablet 30 mg  30 mg Oral Daily Alyson Kuroski-Mazzei, DO   30 mg at 07/25/11 0808  . divalproex (DEPAKOTE ER) 24 hr  tablet 750 mg  750 mg Oral Daily Alyson Kuroski-Mazzei, DO   750 mg at 07/25/11 1610  . magnesium hydroxide (MILK OF MAGNESIA) suspension 30 mL  30 mL Oral Daily PRN Wonda Cerise, MD      . multivitamin with minerals tablet 1 tablet  1 tablet Oral Daily Sanjuana Kava, NP   1 tablet at 07/25/11 9604  . potassium chloride SA (K-DUR,KLOR-CON) CR tablet 20 mEq  20 mEq Oral BID Wonda Cerise, MD   20 mEq at 07/25/11 0808  . thiamine (B-1) injection 100 mg  100 mg Intramuscular Once Sanjuana Kava, NP      . thiamine (VITAMIN B-1) tablet 100 mg  100 mg Oral Daily Sanjuana Kava, NP   100 mg at 07/25/11 5409  . traZODone  (DESYREL) tablet 100 mg  100 mg Oral QHS Alyson Kuroski-Mazzei, DO   100 mg at 07/24/11 2136    Lab Results:  Results for orders placed during the hospital encounter of 07/20/11 (from the past 72 hour(s))  VALPROIC ACID LEVEL     Status: Abnormal   Collection Time   07/24/11  6:55 AM      Component Value Range Comment   Valproic Acid Lvl 25.9 (*) 50.0 - 100.0 ug/mL     RISK REDUCTION FACTORS: What pt has learned from hospital stay is that listening to the experience, strength, and hope has helped him.  Risk of self harm is elevated by his bipolar disorder and his addictions, but he has his desire to have a clean brain and have a peaceful life.  Risk of harm to others is minimal in that he has not been involved in fights or had any legal charges filed on him.  Pt seen in treatment team where he divulged the above information. The treatment team concluded that he was ready for discharge and had met his goals for an inpatient setting.   PLAN: Discharge home Continue Medication List  As of 07/25/2011 12:37 PM   TAKE these medications      Indication    ARIPiprazole 5 MG tablet   Commonly known as: ABILIFY   Take 1 tablet (5 mg total) by mouth daily. For mood control       citalopram 10 MG tablet   Commonly known as: CELEXA   Take 3 tablets (30 mg total) by mouth daily. For depression       divalproex 250 MG 24 hr tablet   Commonly known as: DEPAKOTE ER   Take 3 tablets (750 mg total) by mouth daily. For mood stabilization       traZODone 100 MG tablet   Commonly known as: DESYREL   Take 1 tablet (100 mg total) by mouth at bedtime. For sleep            Follow-up recommendations:  Activities: Resume typical activities Diet: Resume typical diet Other: Follow up with outpatient provider and report any side effects to out patient prescriber. \ Plan: Pt has met his inpatient detox goals and is ready to shift to [facility] for rehab. Will D/C today.   [name],  [name] 07/25/2011, 12:36 PM

## 2011-07-25 NOTE — Progress Notes (Signed)
[facility] Group Notes:  (Counselor/Nursing/MHT/Case Management/Adjunct)  07/25/2011 6:00 PM  Type of Therapy:  Group Therapy at 11  Participation Level:  Active  Participation Quality:  Intrusive and Monopolizing  Affect:  Blunted and Irritable  Cognitive:  Alert and Oriented  Insight:  None  Engagement in Group:  Limited  Engagement in Therapy:  None  Modes of Intervention:  Limit-setting  Summary of Progress/Problems:  Laquon was quiet during group until conflict occurred and it was obvious that Ziv was unaware of complaints against him.  (Another patient was complaining of his flatulence.)  Norton became irritated with another male patient and began to yell in group and was not responsive to writer's limit-setting.  Group dispersed to medication room and Manning meet with Social worker.    [name] 07/25/2011, 6:00 PM

## 2011-07-25 NOTE — Progress Notes (Signed)
Patient discharged home per MD order.  Patient denies any SI/HI/AVH.  Patient vested in his treatment; stated he plans on attending AA meetings.  He describes his pattern of getting a paycheck and using it to drink.  Patient was tearful and states that his boss is going to be so disappointed in him.  Patient received all belongings and prescriptions/samples.  He left ambulatory with his employer.

## 2011-07-26 NOTE — Discharge Summary (Signed)
I agree with this D/C Summary.  

## 2011-07-27 NOTE — Progress Notes (Signed)
Patient Discharge Instructions:  After Visit Summary (AVS):   Faxed to:  07/27/2011 Psychiatric Admission Assessment Note:   Faxed to:  07/27/2011 Suicide Risk Assessment - Discharge Assessment:   Faxed to:  07/27/2011 Faxed/Sent to the Next Level Care provider:  07/27/2011  Faxed to [facility] [name] @ [phone] And to [facility] @ [phone]  Heloise Purpura Eduard Clos, 07/27/2011, 3:50 PM

## 2011-07-30 ENCOUNTER — Encounter (HOSPITAL_COMMUNITY): Payer: Self-pay | Admitting: Emergency Medicine

## 2011-07-30 ENCOUNTER — Emergency Department (HOSPITAL_COMMUNITY)
Admission: EM | Admit: 2011-07-30 | Discharge: 2011-08-01 | Disposition: A | Discharge status: Home / Self Care | Payer: No Typology Code available for payment source | Attending: Emergency Medicine | Admitting: Emergency Medicine

## 2011-07-30 DIAGNOSIS — G8929 Other chronic pain: Secondary | ICD-10-CM | POA: Insufficient documentation

## 2011-07-30 DIAGNOSIS — F1010 Alcohol abuse, uncomplicated: Secondary | ICD-10-CM | POA: Insufficient documentation

## 2011-07-30 DIAGNOSIS — F3289 Other specified depressive episodes: Secondary | ICD-10-CM | POA: Insufficient documentation

## 2011-07-30 DIAGNOSIS — F329 Major depressive disorder, single episode, unspecified: Secondary | ICD-10-CM | POA: Insufficient documentation

## 2011-07-30 DIAGNOSIS — F17200 Nicotine dependence, unspecified, uncomplicated: Secondary | ICD-10-CM | POA: Insufficient documentation

## 2011-07-30 DIAGNOSIS — F1020 Alcohol dependence, uncomplicated: Secondary | ICD-10-CM | POA: Insufficient documentation

## 2011-07-30 DIAGNOSIS — M549 Dorsalgia, unspecified: Secondary | ICD-10-CM | POA: Insufficient documentation

## 2011-07-30 DIAGNOSIS — R45851 Suicidal ideations: Secondary | ICD-10-CM | POA: Insufficient documentation

## 2011-07-30 DIAGNOSIS — I10 Essential (primary) hypertension: Secondary | ICD-10-CM | POA: Insufficient documentation

## 2011-07-30 DIAGNOSIS — Z79899 Other long term (current) drug therapy: Secondary | ICD-10-CM | POA: Insufficient documentation

## 2011-07-30 LAB — URINALYSIS, ROUTINE W REFLEX MICROSCOPIC
Bilirubin Urine: NEGATIVE
Glucose, UA: NEGATIVE mg/dL
Hgb urine dipstick: NEGATIVE
Ketones, ur: NEGATIVE mg/dL
Specific Gravity, Urine: 1.011 (ref 1.005–1.030)
pH: 5 (ref 5.0–8.0)

## 2011-07-30 LAB — CBC WITH DIFFERENTIAL/PLATELET
Basophils Relative: 0 % (ref 0–1)
HCT: 39.1 % (ref 39.0–52.0)
Hemoglobin: 13.8 g/dL (ref 13.0–17.0)
Lymphs Abs: 2.3 10*3/uL (ref 0.7–4.0)
MCHC: 35.3 g/dL (ref 30.0–36.0)
Monocytes Absolute: 0.8 10*3/uL (ref 0.1–1.0)
Monocytes Relative: 11 % (ref 3–12)
Neutro Abs: 3.4 10*3/uL (ref 1.7–7.7)
RBC: 4.22 MIL/uL (ref 4.22–5.81)

## 2011-07-30 LAB — BASIC METABOLIC PANEL
BUN: 11 mg/dL (ref 6–23)
CO2: 22 mEq/L (ref 19–32)
Chloride: 99 mEq/L (ref 96–112)
Creatinine, Ser: 0.76 mg/dL (ref 0.50–1.35)
GFR calc Af Amer: >90 mL/min (ref >90)
Glucose, Bld: 92 mg/dL (ref 70–99)

## 2011-07-30 LAB — ETHANOL: Alcohol, Ethyl (B): 138 mg/dL — ABNORMAL HIGH (ref 0–11)

## 2011-07-30 LAB — RAPID URINE DRUG SCREEN, HOSP PERFORMED
Benzodiazepines: POSITIVE — AB
Cocaine: NOT DETECTED

## 2011-07-30 MED ORDER — LORAZEPAM 1 MG PO TABS
1.0000 mg | ORAL_TABLET | Freq: Three times a day (TID) | ORAL | Status: DC | PRN
  Administered 2011-07-31: 1 mg via ORAL
  Filled 2011-07-30: qty 1

## 2011-07-30 MED ORDER — IBUPROFEN 600 MG PO TABS
600.0000 mg | ORAL_TABLET | Freq: Three times a day (TID) | ORAL | Status: DC | PRN
  Administered 2011-07-31: 600 mg via ORAL
  Filled 2011-07-30 (×2): qty 1

## 2011-07-30 MED ORDER — ZOLPIDEM TARTRATE 5 MG PO TABS: 5.0000 mg | ORAL_TABLET | Freq: Every evening | ORAL | Status: DC | PRN

## 2011-07-30 MED ORDER — ALUM & MAG HYDROXIDE-SIMETH 200-200-20 MG/5ML PO SUSP: 30.0000 mL | ORAL | Status: DC | PRN

## 2011-07-30 MED ORDER — ONDANSETRON HCL 4 MG PO TABS
4.0000 mg | ORAL_TABLET | Freq: Three times a day (TID) | ORAL | Status: DC | PRN
  Administered 2011-07-31: 4 mg via ORAL
  Filled 2011-07-30: qty 1

## 2011-07-30 MED ORDER — NICOTINE 21 MG/24HR TD PT24
21.0000 mg | MEDICATED_PATCH | Freq: Every day | TRANSDERMAL | Status: DC
  Filled 2011-07-30 (×2): qty 1

## 2011-07-30 MED ORDER — ACETAMINOPHEN 325 MG PO TABS
650.0000 mg | ORAL_TABLET | ORAL | Status: DC | PRN
  Administered 2011-07-31: 650 mg via ORAL
  Filled 2011-07-30: qty 2

## 2011-07-30 NOTE — ED Notes (Signed)
[id number]<MV> Expected date:07/30/11<BR> Expected time: 3:07 AM<BR> Means of arrival:Ambulance<BR> Comments:<BR> ETOH

## 2011-07-30 NOTE — ED Notes (Signed)
[id number]<GN> Expected date:<BR> Expected time:<BR> Means of arrival:<BR> Comments:<BR> closed

## 2011-07-30 NOTE — ED Notes (Signed)
Per ems: the patient found his girlfriend with another man, and he felt SI - the patient now wanting Detox. The patient is nauseated, and has had large amount to drink tonight

## 2011-07-30 NOTE — BH Assessment (Signed)
Assessment Note   [name] is an 46 y.o. male.    Pt mother died on Aug 15, 2011.  Pt depressed, tearful, flat affect, poor eye contact and speech soft.  Pt reports no immediate concrete plan to end life but reports "If I leave here, I am going to do something."  Pt has long hx of SA and MH related needs and pt unable to contract for safety reliably.  Pt reports he has been using alcohol daily for 3 to 4 weeks drinking approximately 12 beers daily.  Pt denies HI, AVH.  Pt cooperative.  [facility] will consider [facility] if [facility] perceives pt will be appropriate for tx setting.  Axis I: Mood Disorder NOS and alcohol dependency Axis II: Deferred Axis III:  Past Medical History  Diagnosis Date  . Hypertension   . Alcoholism /alcohol abuse    Axis IV: economic problems, other psychosocial or environmental problems, problems related to social environment and problems with primary support group Axis V: 31-40 impairment in reality testing  Past Medical History:  Past Medical History  Diagnosis Date  . Hypertension   . Alcoholism /alcohol abuse     Past Surgical History  Procedure Date  . Hernia repair     Family History: History reviewed. No pertinent family history.  Social History:  reports that he has been smoking Cigarettes.  He has a 30 pack-year smoking history. He does not have any smokeless tobacco history on file. He reports that he drinks alcohol. He reports that he does not use illicit drugs.  Additional Social History:  Alcohol / Drug Use Pain Medications: na Prescriptions: na Over the Counter: na History of alcohol / drug use?: Yes Substance #1 Name of Substance 1: etoh 1 - Age of First Use: 15 1 - Amount (size/oz): 12 beers or more 1 - Frequency: daily 1 - Duration: 4weeks 1 - Last Use / Amount: 07/29/11  CIWA: CIWA-Ar BP: 105/57 mmHg Pulse Rate: 72  Nausea and Vomiting: no nausea and no vomiting Tactile Disturbances: none Tremor: no tremor Auditory Disturbances:  not present Paroxysmal Sweats: no sweat visible Visual Disturbances: not present Anxiety: two Headache, Fullness in Head: very mild Agitation: normal activity Orientation and Clouding of Sensorium: oriented and can do serial additions CIWA-Ar Total: 3  COWS:    Allergies: No Known Allergies  Home Medications:  (Not in a hospital admission)  OB/GYN Status:  No LMP for male patient.  General Assessment Data [facility] of Assessment: [facility] Living Arrangements: Alone Can pt return to current living arrangement?: Yes Admission Status: Voluntary Is patient capable of signing voluntary admission?: Yes Transfer from: Acute Hospital Referral Source: MD  Education Status Is patient currently in school?: No  Risk to self Suicidal Ideation: Yes-Currently Present Suicidal Intent: No Is patient at risk for suicide?: Yes Suicidal Plan?: Yes-Currently Present Specify Current Suicidal Plan: "I don't know but if I leave I will do something.  I aint safe to leave" Access to Means: Yes Specify Access to Suicidal Means: can walk What has been your use of drugs/alcohol within the last 12 months?: yes Previous Attempts/Gestures: Yes How many times?: 2  Other Self Harm Risks: no Triggers for Past Attempts: Unpredictable Intentional Self Injurious Behavior: None Family Suicide History: No Recent stressful life event(s): Other (Comment) (mother died 08-15-2011) Persecutory voices/beliefs?: No Depression: Yes Depression Symptoms: Insomnia;Tearfulness;Isolating;Fatigue;Guilt;Loss of interest in usual pleasures;Feeling worthless/self pity;Feeling angry/irritable Substance abuse history and/or treatment for substance abuse?: Yes Suicide prevention information given to non-admitted patients:  Not applicable  Risk to Others Homicidal Ideation: No Thoughts of Harm to Others: No Current Homicidal Intent: No Current Homicidal Plan: No Access to Homicidal Means: No Identified Victim: na History of  harm to others?: No Assessment of Violence: None Noted Violent Behavior Description: cooperative Does patient have access to weapons?: No Criminal Charges Pending?: No Does patient have a court date: No  Psychosis Hallucinations: None noted Delusions: None noted  Mental Status Report Appear/Hygiene: Disheveled Eye Contact: Fair Motor Activity: Unremarkable Speech: Logical/coherent Level of Consciousness: Alert Mood: Depressed;Anxious Affect: Anxious;Depressed Anxiety Level: Minimal Panic attack frequency: 1 Most recent panic attack: 07-29-11 Thought Processes: Coherent Judgement: Impaired Orientation: Person;Place;Time;Situation Obsessive Compulsive Thoughts/Behaviors: None  Cognitive Functioning Concentration: Decreased Memory: Recent Intact;Remote Intact IQ: Average Insight: Poor Impulse Control: Poor Appetite: Fair Weight Loss: 0  Weight Gain: 0  Sleep: Decreased Total Hours of Sleep: 2  Vegetative Symptoms: None  ADLScreening [facility]) Patient's cognitive ability adequate to safely complete daily activities?: Yes Patient able to express need for assistance with ADLs?: Yes Independently performs ADLs?: Yes  Abuse/Neglect [facility]) Physical Abuse: Denies Verbal Abuse: Denies Sexual Abuse: Denies  Prior Inpatient Therapy Prior Inpatient Therapy: Yes Prior Therapy Dates: 2010, 2011, 2012, 2013 Prior Therapy Facilty/Provider(s): [facility], [facility], [facility], [facility] Reason for Treatment: Detox, Depression  Prior Outpatient Therapy Prior Outpatient Therapy: Yes Prior Therapy Dates: 2013-current Prior Therapy Facilty/Provider(s): [facility] Reason for Treatment: med mgnt  ADL Screening (condition at time of admission) Patient's cognitive ability adequate to safely complete daily activities?: Yes Patient able to express need for assistance with ADLs?: Yes Independently performs ADLs?: Yes Communication: Independent Dressing (OT): Independent Grooming:  Independent Feeding: Independent Bathing: Independent Toileting: Independent In/Out Bed: Independent Walks in Home: Independent Weakness of Legs: None Weakness of Arms/Hands: None  Home Assistive Devices/Equipment Home Assistive Devices/Equipment: None  Therapy Consults (therapy consults require a physician order) PT Evaluation Needed: No OT Evalulation Needed: No SLP Evaluation Needed: No Abuse/Neglect Assessment (Assessment to be complete while patient is alone) Physical Abuse: Denies Verbal Abuse: Denies Sexual Abuse: Denies Exploitation of patient/patient's resources: Denies Self-Neglect: Denies Values / Beliefs Cultural Requests During Hospitalization: None Spiritual Requests During Hospitalization: None Consults Spiritual Care Consult Needed: No Social Work Consult Needed: No Merchant navy officer (For Healthcare) Advance Directive: Patient does not have advance directive Pre-existing out of facility DNR order (yellow form or pink MOST form): No Nutrition Screen Unintentional weight loss greater than 10lbs within the last month: No Problems chewing or swallowing foods and/or liquids: No Home Tube Feeding or Total Parenteral Nutrition (TPN): No Patient appears severely malnourished: No  Additional Information 1:1 In Past 12 Months?: No CIRT Risk: No Elopement Risk: No Does patient have medical clearance?: Yes     Disposition:   Pt referred to [facility] for admit since pt is Suicidal with vague plan.  [facility] and [facility] will not accept with this level of depression and safety concerns  Disposition Disposition of Patient: Referred to;Inpatient treatment program Type of inpatient treatment program: Adult Patient referred to: Other (Comment) (referred to [facility] because [facility] and [facility] won't take with SI)  On Site Evaluation by:   Reviewed with Physician:     Titus Mould, Eppie Gibson 07/30/2011 11:10 AM

## 2011-07-30 NOTE — ED Notes (Signed)
Pt belongings: Shirt, shoes, pants, underwear and black bag.

## 2011-07-30 NOTE — ED Provider Notes (Signed)
Saw the patient and agrees that he is not actively suicidal but is depressed. They state his depression can be treated as an outpatient basis. Patient will need alcohol detox and rehabilitation.  Gwyneth Sprout, MD 07/30/11 1601

## 2011-07-30 NOTE — ED Notes (Signed)
Pt in bed sleeping

## 2011-07-30 NOTE — ED Provider Notes (Signed)
History     CSN: [id number]  Arrival date & time 07/30/11  0320   First MD Initiated Contact with Patient 07/30/11 [phone]      Chief Complaint  Patient presents with  . Suicidal  . Medical Clearance    (Consider location/radiation/quality/duration/timing/severity/associated sxs/prior treatment) HPI History provided by patient. Brought in by EMS after drinking tonight. He is requesting detox from alcohol. States he's had detox for 5 times and [facility] [place]. He denies any suicidal ideation to me. No homicidal ideation. No hallucinations or psychosis. No nausea vomiting. He denies any pain injury or trauma. Moderate severity. History of same. No known aggravating or alleviating factors. No self injury. Denies drug use Past Medical History  Diagnosis Date  . Hypertension   . Alcoholism /alcohol abuse     Past Surgical History  Procedure Date  . Hernia repair     History reviewed. No pertinent family history.  History  Substance Use Topics  . Smoking status: Current Everyday Smoker -- 1.0 packs/day for 30 years    Types: Cigarettes  . Smokeless tobacco: Not on file  . Alcohol Use: Yes     Drinks every day, 9-11 40 oz beers.      Review of Systems  Constitutional: Negative for fever and chills.  HENT: Negative for neck pain and neck stiffness.   Eyes: Negative for pain.  Respiratory: Negative for shortness of breath.   Cardiovascular: Negative for chest pain.  Gastrointestinal: Negative for abdominal pain.  Genitourinary: Negative for dysuria.  Musculoskeletal: Negative for back pain.  Skin: Negative for rash.  Neurological: Negative for headaches.  All other systems reviewed and are negative.    Allergies  Review of patient's allergies indicates no known allergies.  Home Medications   Current Outpatient Rx  Name Route Sig Dispense Refill  . ARIPIPRAZOLE 5 MG PO TABS Oral Take 1 tablet (5 mg total) by mouth daily. For mood  control 30 tablet 0  . CITALOPRAM HYDROBROMIDE 10 MG PO TABS Oral Take 3 tablets (30 mg total) by mouth daily. For depression 30 tablet 0  . DIVALPROEX SODIUM ER 250 MG PO TB24 Oral Take 3 tablets (750 mg total) by mouth daily. For mood stabilization 90 tablet 0  . TRAZODONE HCL 100 MG PO TABS Oral Take 1 tablet (100 mg total) by mouth at bedtime. For sleep 30 tablet 0    BP 135/83  Pulse 60  Temp 98.2 F (36.8 C) (Oral)  Resp 18  SpO2 97%  Physical Exam  Constitutional: He is oriented to person, place, and time. He appears well-developed and well-nourished.  HENT:  Head: Normocephalic and atraumatic.  Eyes: Conjunctivae and EOM are normal. Pupils are equal, round, and reactive to light.  Neck: Trachea normal. Neck supple. No thyromegaly present.  Cardiovascular: Normal rate, regular rhythm, S1 normal, S2 normal and normal pulses.     No systolic murmur is present   No diastolic murmur is present  Pulses:      Radial pulses are 2+ on the right side, and 2+ on the left side.  Pulmonary/Chest: Effort normal and breath sounds normal. He has no wheezes. He has no rhonchi. He has no rales. He exhibits no tenderness.  Abdominal: Soft. Normal appearance and bowel sounds are normal. There is no tenderness. There is no CVA tenderness and negative Murphy's sign.  Musculoskeletal:       BLE:s Calves nontender, no cords or erythema, negative Homans sign  Neurological: He is alert and oriented to person, place, and time. He has normal strength. No cranial nerve deficit or sensory deficit. GCS eye subscore is 4. GCS verbal subscore is 5. GCS motor subscore is 6.  Skin: Skin is warm and dry. No rash noted. He is not diaphoretic.  Psychiatric: His speech is normal.       Cooperative and appropriate    ED Course  Procedures (including critical care time)  Results for orders placed during the hospital encounter of 07/30/11  URINE RAPID DRUG SCREEN (HOSP PERFORMED)      Component Value Range    Opiates NONE DETECTED  NONE DETECTED   Cocaine NONE DETECTED  NONE DETECTED   Benzodiazepines POSITIVE (*) NONE DETECTED   Amphetamines NONE DETECTED  NONE DETECTED   Tetrahydrocannabinol NONE DETECTED  NONE DETECTED   Barbiturates NONE DETECTED  NONE DETECTED  ETHANOL      Component Value Range   Alcohol, Ethyl (B) 138 (*) 0 - 11 mg/dL  CBC WITH DIFFERENTIAL      Component Value Range   WBC 6.8  4.0 - 10.5 K/uL   RBC 4.22  4.22 - 5.81 MIL/uL   Hemoglobin 13.8  13.0 - 17.0 g/dL   HCT 19.1  47.8 - 29.5 %   MCV 92.7  78.0 - 100.0 fL   MCH 32.7  26.0 - 34.0 pg   MCHC 35.3  30.0 - 36.0 g/dL   RDW 62.1  30.8 - 65.7 %   Platelets 225  150 - 400 K/uL   Neutrophils Relative 50  43 - 77 %   Neutro Abs 3.4  1.7 - 7.7 K/uL   Lymphocytes Relative 35  12 - 46 %   Lymphs Abs 2.3  0.7 - 4.0 K/uL   Monocytes Relative 11  3 - 12 %   Monocytes Absolute 0.8  0.1 - 1.0 K/uL   Eosinophils Relative 3  0 - 5 %   Eosinophils Absolute 0.2  0.0 - 0.7 K/uL   Basophils Relative 0  0 - 1 %   Basophils Absolute 0.0  0.0 - 0.1 K/uL  BASIC METABOLIC PANEL      Component Value Range   Sodium 133 (*) 135 - 145 mEq/L   Potassium 3.1 (*) 3.5 - 5.1 mEq/L   Chloride 99  96 - 112 mEq/L   CO2 22  19 - 32 mEq/L   Glucose, Bld 92  70 - 99 mg/dL   BUN 11  6 - 23 mg/dL   Creatinine, Ser 8.46  0.50 - 1.35 mg/dL   Calcium 8.7  8.4 - 96.2 mg/dL   GFR calc non Af Amer >90  >90 mL/min   GFR calc Af Amer >90  >90 mL/min  URINALYSIS, ROUTINE W REFLEX MICROSCOPIC      Component Value Range   Color, Urine YELLOW  YELLOW   APPearance CLEAR  CLEAR   Specific Gravity, Urine 1.011  1.005 - 1.030   pH 5.0  5.0 - 8.0   Glucose, UA NEGATIVE  NEGATIVE mg/dL   Hgb urine dipstick NEGATIVE  NEGATIVE   Bilirubin Urine NEGATIVE  NEGATIVE   Ketones, ur NEGATIVE  NEGATIVE mg/dL   Protein, ur NEGATIVE  NEGATIVE mg/dL   Urobilinogen, UA 0.2  0.0 - 1.0 mg/dL   Nitrite NEGATIVE  NEGATIVE   Leukocytes, UA NEGATIVE  NEGATIVE   ACT  consult for eval and attempt placement for detox  Psych holding Orders initiated.  MDM  Alcohol abuse. Requesting detox. Passive SI. Voluntary this time.         [name], MD 07/30/11 [phone]

## 2011-07-31 MED ORDER — DIVALPROEX SODIUM ER 500 MG PO TB24
750.0000 mg | ORAL_TABLET | Freq: Every day | ORAL | Status: DC
  Administered 2011-07-31 – 2011-08-01 (×2): 750 mg via ORAL
  Filled 2011-07-31 (×2): qty 1

## 2011-07-31 MED ORDER — LORAZEPAM 1 MG PO TABS: 1.0000 mg | ORAL_TABLET | Freq: Three times a day (TID) | ORAL | Status: DC | PRN

## 2011-07-31 MED ORDER — ARIPIPRAZOLE 5 MG PO TABS
5.0000 mg | ORAL_TABLET | Freq: Every day | ORAL | Status: DC
  Administered 2011-07-31 – 2011-08-01 (×2): 5 mg via ORAL
  Filled 2011-07-31 (×2): qty 1

## 2011-07-31 MED ORDER — LORAZEPAM 2 MG/ML IJ SOLN: 2.0000 mg | Freq: Once | INTRAMUSCULAR | Status: DC

## 2011-07-31 MED ORDER — TRAZODONE HCL 100 MG PO TABS
100.0000 mg | ORAL_TABLET | Freq: Every day | ORAL | Status: DC
  Administered 2011-07-31: 100 mg via ORAL
  Filled 2011-07-31: qty 1

## 2011-07-31 MED ORDER — CITALOPRAM HYDROBROMIDE 20 MG PO TABS
30.0000 mg | ORAL_TABLET | Freq: Every day | ORAL | Status: DC
  Administered 2011-07-31 – 2011-08-01 (×2): 30 mg via ORAL
  Filled 2011-07-31 (×2): qty 1

## 2011-07-31 NOTE — ED Provider Notes (Signed)
Pt now with si with plan. Also now with chronic back pain. Will give motrin for pain. telepsych pending  Toy Baker, MD 07/31/11 [phone]

## 2011-07-31 NOTE — ED Notes (Signed)
Breakfast tray received.

## 2011-07-31 NOTE — ED Notes (Addendum)
rn accidentally placed verbal order for ativan 2mg  IM in wrong pts chart, discontinued all orders for ativan including ativan 1 mg q 8 hours prn for anxiety. Reordered pts prn ativan order

## 2011-07-31 NOTE — ED Notes (Signed)
Pt alert and oriented x4. Respirations even and unlabored, bilateral symmetrical rise and fall of chest. Skin warm and dry. In no acute distress. Denies needs.   

## 2011-08-01 NOTE — Progress Notes (Signed)
Behavioral Health Group  Facilitated behavioral health group for pt's in Psych ED. Group focused on discharge planning, maintaining wellness, and anticipating/planning for potential barriers to well-being. Group was open and engaged w/ mutual sharing and support.  Pt was active and engaged in the group. Pt shared that he checked self into Psych ED voluntarily to detox from ETOH. Pt reported that he was feeling shaky and sweaty today, but was ready to be discharged. Pt stated that he was originally thinking about going to tx center upon discharge, but had changed his mind and wanted to go home. Pt stated that he had bills and child support to pay so he needed to get back to work so he could pay these. Pt reported that he wants to find stability in his life, maintain sobriety and prevent relapse, and establish a supportive network to help him after discharge. Pt ID'd triggers to ETOH (re: bars, seeing alcohol) and cravings as greatest threats to his goal of relapse prevention. Pt stated he intended to go to AA meeting the evening of his discharge and to find a sponsor. Pt stated that he wanted to establish a support group via AA and that he wanted to use his sponsor to help him through his triggers to use when he encounters them. Pt recognized in group that a change in mindset, motivation, and proper planning are key to maintaining his sobriety.  Marylu Dudenhoeffer B MS, LPCA, NCC

## 2011-08-01 NOTE — ED Provider Notes (Signed)
Pt has stabilized.  He denies any complaints at this time.  He denies SI or and states he will take his medications.  He would like to go home.  Pt contracts for safety.  Celene Kras, MD 08/01/11 [phone]

## 2011-08-01 NOTE — Discharge Instructions (Signed)
Depression You have signs of depression. This is a common problem. It can occur at any age. It is often hard to recognize. People can suffer from depression and still have moments of enjoyment. Depression interferes with your basic ability to function in life. It upsets your relationships, sleep, eating, and work habits. CAUSES  Depression is believed to be caused by an imbalance in brain chemicals. It may be triggered by an unpleasant event. Relationship crises, a death in the family, financial worries, retirement, or other stressors are normal causes of depression. Depression may also start for no known reason. Other factors that may play a part include medical illnesses, some medicines, genetics, and alcohol or drug abuse. SYMPTOMS   Feeling unhappy or worthless.   Long-lasting (chronic) tiredness or worn-out feeling.   Self-destructive thoughts and actions.   Not being able to sleep or sleeping too much.   Eating more than usual or not eating at all.   Headaches or feeling anxious.   Trouble concentrating or making decisions.   Unexplained physical problems and substance abuse.  TREATMENT  Depression usually gets better with treatment. This can include:  Antidepressant medicines. It can take weeks before the proper dose is achieved and benefits are reached.   Talking with a therapist, clergyperson, counselor, or friend. These people can help you gain insight into your problem and regain control of your life.   Eating a good diet.   Getting regular physical exercise, such as walking for 30 minutes every day.   Not abusing alcohol or drugs.  Treating depression often takes 6 months or longer. This length of treatment is needed to keep symptoms from returning. Call your caregiver and arrange for follow-up care as suggested. SEEK IMMEDIATE MEDICAL CARE IF:   You start to have thoughts of hurting yourself or others.   Call your local emergency services (911 in [place]   Go to  your local medical emergency department.   Call the National Suicide Prevention Lifeline: 1-800-273-TALK 317-021-8915).  Document Released: 01/17/2005 Document Revised: 01/06/2011 Document Reviewed: 06/19/2009 [facility] Patient Information 2012 [place], [place].Suicidal Feelings, How to Help Yourself Everyone feels sad or unhappy at times, but depressing thoughts and feelings of hopelessness can lead to thoughts of suicide. It can seem as if life is too tough to handle. It is as if the mountain is just too high and your climbing skills are not great enough. At that moment these dark thoughts and feelings may seem overwhelming and never ending. It is important to remember these feelings are temporary! They will go away. If you feel as though you have reached the point where suicide is the only answer, it is time to let someone know immediately. This is the first step to feeling better. The following steps will move you to safer ground and lead you in a positive direction out of depression. HOW TO COPE AND PREVENT SUICIDE  Let family, friends, teachers and/or counselors know. Get help. Try not to isolate yourself from those who care about you. Even though you may not feel sociable or think that you are not good company, talk with someone everyday. It is best if it is face to face. Remember, they will want to help you.   Eat a regularly spaced and well-balanced diet, and get plenty of rest.   Avoid alcohol and drugs because they will only make you feel worse and may also lower your inhibitions. Remove them from the home. If you are thinking of taking an overdose of  your prescribed medications, give your medicines to someone who can give them to you one day at a time. If you are on antidepressants, let your caregiver know of your feelings so he or she can provide a safer medication, if that is a concern.   Remove weapons or poisons from your home.   Try to stick to routines. That may mean just walking  the dog or feeding the cat. Follow a schedule and remind yourself that you have to keep that schedule every day. Play with your pets. If it is possible, and you do not have a pet, get one. They give you a sense of well-being, lower your blood pressure and make your heart feel good. They need you, and we all want to be needed.   Set some realistic goals and achieve them. Make a list and cross things off as you go. Accomplishments give a sense of worth. Wait until you are feeling better before doing things you find difficult or unpleasant to do.   If you are able, try to start exercising. Even half-hour periods of exercise each day will make you feel better. Getting out in the sun or into nature helps you recover from depression faster. If you have a favorite place to walk, take advantage of that.   Increase safe activities that have always given you pleasure. This may include playing your favorite music, reading a good book, painting a picture or playing your favorite instrument. Do whatever takes your mind off your depression and puts a smile on your face.   Keep your living space well lit with windows open, and let the sun shine in. Bright light definitely treats depression, not just people with the seasonal affective disorders (SAD).  Above all else remember, depression is temporary. It will go away. Do not contemplate suicide. Death as a permanent solution is not the answer. Suicide will take away the beautiful rest of life, and do lifelong harm to those around you who love you. Help is available. National Suicide Help Lines with 24 hour help are: 1-800-SUICIDE 6407160332 Document Released: 07/24/2002 Document Revised: 01/06/2011 Document Reviewed: 12/12/2006 [facility] Patient Information 2012 [place], [place].

## 2011-08-01 NOTE — [facility] Counselor (Signed)
Patient presented to [facility] 08/29/2011 and per EDP's report patient was brought in by EMS after drinking the night of 08/31/2011. Patient requesting detox from alcohol. States he's had detox for 5 times and [facility] [place]. He denies any suicidal ideation to the EDP. No homicidal ideation. No hallucinations or psychosis. No nausea vomiting. He denies any pain injury or trauma. Moderate severity. History of same. No known aggravating or alleviating factors. No self injury. Denies drug use. Shift report from previous shift indicates that psychiatrist Dr. Elsie Saas will need to evaluate patient for safety.  The psychiatrist is not present today, therefore; telepsych would have to be completed.   Writer spoke to patient about his current symptoms and he sts, "I feel fine maybe some hot flashes but that's about it". Also asked patient if he had any suicidal thoughts at this time. Patient sts, "absoluetely not". Patient asked if he is able to contract for safety and he replies, "Absolutely". Patient has no thoughts of harming others. No psychotic symptoms noted. Spoke to EDP Dr. Clarene Duke to inform her of how patient is feeling today. She sts that due to notes indicating patient is suicidal from previous EDP's she would like a telepsych to clear patient for discharge today.   Writer asked patients nurse Veranetta to assist with this process. Karna Christmas was very helpful and agreeable to initiate this process.

## 2011-08-14 ENCOUNTER — Encounter (HOSPITAL_COMMUNITY): Payer: Self-pay | Admitting: *Deleted

## 2011-08-14 ENCOUNTER — Emergency Department (HOSPITAL_COMMUNITY)
Admission: EM | Admit: 2011-08-14 | Discharge: 2011-08-16 | Disposition: A | Discharge status: Other Acute Inpatient Hospital | Payer: No Typology Code available for payment source | Attending: Emergency Medicine | Admitting: Emergency Medicine

## 2011-08-14 DIAGNOSIS — R45851 Suicidal ideations: Secondary | ICD-10-CM | POA: Insufficient documentation

## 2011-08-14 DIAGNOSIS — I10 Essential (primary) hypertension: Secondary | ICD-10-CM | POA: Insufficient documentation

## 2011-08-14 DIAGNOSIS — F1010 Alcohol abuse, uncomplicated: Secondary | ICD-10-CM | POA: Insufficient documentation

## 2011-08-14 DIAGNOSIS — F17200 Nicotine dependence, unspecified, uncomplicated: Secondary | ICD-10-CM | POA: Insufficient documentation

## 2011-08-14 DIAGNOSIS — F1020 Alcohol dependence, uncomplicated: Secondary | ICD-10-CM | POA: Insufficient documentation

## 2011-08-14 LAB — COMPREHENSIVE METABOLIC PANEL
ALT: 15 U/L (ref 0–53)
AST: 19 U/L (ref 0–37)
Albumin: 3.6 g/dL (ref 3.5–5.2)
Alkaline Phosphatase: 57 U/L (ref 39–117)
CO2: 23 mEq/L (ref 19–32)
Chloride: 105 mEq/L (ref 96–112)
Creatinine, Ser: 0.75 mg/dL (ref 0.50–1.35)
GFR calc non Af Amer: >90 mL/min (ref >90)
Potassium: 3.3 mEq/L — ABNORMAL LOW (ref 3.5–5.1)
Sodium: 138 mEq/L (ref 135–145)
Total Bilirubin: 0.2 mg/dL — ABNORMAL LOW (ref 0.3–1.2)

## 2011-08-14 LAB — CBC
Platelets: 198 10*3/uL (ref 150–400)
RBC: 4.31 MIL/uL (ref 4.22–5.81)
RDW: 13.3 % (ref 11.5–15.5)
WBC: 4.6 10*3/uL (ref 4.0–10.5)

## 2011-08-14 LAB — APTT: aPTT: 30 seconds (ref 24–37)

## 2011-08-14 LAB — RAPID URINE DRUG SCREEN, HOSP PERFORMED
Amphetamines: NOT DETECTED
Barbiturates: NOT DETECTED
Opiates: NOT DETECTED
Tetrahydrocannabinol: NOT DETECTED

## 2011-08-14 LAB — PROTIME-INR: INR: 1.04 (ref 0.00–1.49)

## 2011-08-14 MED ORDER — SODIUM CHLORIDE 0.9 % IV SOLN
INTRAVENOUS | Status: DC
  Administered 2011-08-14: 23:00:00 via INTRAVENOUS

## 2011-08-14 NOTE — ED Notes (Signed)
Pt easily arousable. Pt became agitated immediately upon waking and stated "Im about to cut my throat." Emotional support given. Pt fell back to sleep immediately.

## 2011-08-14 NOTE — ED Notes (Signed)
MD bedside

## 2011-08-14 NOTE — ED Notes (Signed)
ZOX:[id number]> Expected date:<BR> Expected time:<BR> Means of arrival:<BR> Comments:<BR> Medic 10, 46 M, ETOH, SI

## 2011-08-14 NOTE — ED Notes (Signed)
Pt states he wants detox from ETOH. Pt states he has consumed 17- 40 oz beers today. Pt reports SI w/ plan to cut throat.

## 2011-08-14 NOTE — ED Notes (Signed)
Bed:WA19<BR> Expected date:<BR> Expected time:<BR> Means of arrival:<BR> Comments:<BR> Triage 1 

## 2011-08-14 NOTE — ED Provider Notes (Signed)
History     CSN: [id number]  Arrival date & time 08/14/11  2128   First MD Initiated Contact with Patient 08/14/11 2156      Chief Complaint  Patient presents with  . Medical Clearance   level V caveat due 2 intoxication.  (Consider location/radiation/quality/duration/timing/severity/associated sxs/prior treatment) The history is provided by the patient.   patient is requesting detox off of alcohol. He states he drinks 10-11 40 ounce beers a day. He states that today he drinks 17 40 ounce beers. He states he is suicidal. She's previously been seen for the same. He states he has a plan to cut his throat. He denies other drug use. Patient states he has vomited up some blood and had some blood in stool also. He states both are minimal.  Past Medical History  Diagnosis Date  . Hypertension   . Alcoholism /alcohol abuse     Past Surgical History  Procedure Date  . Hernia repair     History reviewed. No pertinent family history.  History  Substance Use Topics  . Smoking status: Current Everyday Smoker -- 1.0 packs/day for 30 years    Types: Cigarettes  . Smokeless tobacco: Not on file  . Alcohol Use: Yes     Drinks every day, 9-11 40 oz beers.      Review of Systems  Unable to perform ROS   Allergies  Review of patient's allergies indicates no known allergies.  Home Medications   Current Outpatient Rx  Name Route Sig Dispense Refill  . ARIPIPRAZOLE 5 MG PO TABS Oral Take 1 tablet (5 mg total) by mouth daily. For mood control 30 tablet 0  . CITALOPRAM HYDROBROMIDE 10 MG PO TABS Oral Take 30 mg by mouth daily. For depression    . DIVALPROEX SODIUM ER 250 MG PO TB24 Oral Take 3 tablets (750 mg total) by mouth daily. For mood stabilization 90 tablet 0  . TRAZODONE HCL 100 MG PO TABS Oral Take 1 tablet (100 mg total) by mouth at bedtime. For sleep 30 tablet 0    BP 106/51  Pulse 66  Temp 98.8 F (37.1 C) (Oral)  Resp 26  SpO2 91%  Physical Exam  Constitutional:  He appears well-developed and well-nourished.  HENT:  Head: Normocephalic.  Eyes: Pupils are equal, round, and reactive to light. No scleral icterus.  Cardiovascular: Normal rate and regular rhythm.   Pulmonary/Chest: Effort normal.  Abdominal: There is tenderness.       Right upper quadrant tenderness without rebound or guarding.  Musculoskeletal: Normal range of motion.  Neurological: He is alert.       Patient appears intoxicated.  Psychiatric:       Patient is tearful.    ED Course  Procedures (including critical care time)  Labs Reviewed  COMPREHENSIVE METABOLIC PANEL - Abnormal; Notable for the following:    Potassium 3.3 (*)     BUN 4 (*)     Total Bilirubin 0.2 (*)     All other components within normal limits  ETHANOL - Abnormal; Notable for the following:    Alcohol, Ethyl (B) 325 (*)     All other components within normal limits  VALPROIC ACID LEVEL - Abnormal; Notable for the following:    Valproic Acid Lvl <10.0 (*)     All other components within normal limits  CBC  URINE RAPID DRUG SCREEN (HOSP PERFORMED)  PROTIME-INR  APTT   No results found.   1. Alcoholism /alcohol abuse  2. Suicidal ideation       MDM  Patient is requesting detox and help with his alcoholism and depression/suicidal thoughts. Alcohol is elevated. Was complaining of gi bleed, but will not allow rectal exam. Hgb is normal. To be seen by [facility].        Juliet Rude. Rubin Payor, MD 08/15/11 1610

## 2011-08-15 ENCOUNTER — Emergency Department (HOSPITAL_COMMUNITY): Payer: Self-pay

## 2011-08-15 DIAGNOSIS — F17200 Nicotine dependence, unspecified, uncomplicated: Secondary | ICD-10-CM

## 2011-08-15 DIAGNOSIS — F1020 Alcohol dependence, uncomplicated: Secondary | ICD-10-CM

## 2011-08-15 DIAGNOSIS — F1010 Alcohol abuse, uncomplicated: Secondary | ICD-10-CM

## 2011-08-15 DIAGNOSIS — R45851 Suicidal ideations: Secondary | ICD-10-CM

## 2011-08-15 MED ORDER — THIAMINE HCL 100 MG/ML IJ SOLN: 100.0000 mg | Freq: Every day | INTRAMUSCULAR | Status: DC

## 2011-08-15 MED ORDER — ADULT MULTIVITAMIN W/MINERALS CH
1.0000 | ORAL_TABLET | Freq: Every day | ORAL | Status: DC
  Administered 2011-08-15: 1 via ORAL
  Filled 2011-08-15: qty 1

## 2011-08-15 MED ORDER — FOLIC ACID 1 MG PO TABS
1.0000 mg | ORAL_TABLET | Freq: Every day | ORAL | Status: DC
  Administered 2011-08-15: 1 mg via ORAL
  Filled 2011-08-15: qty 1

## 2011-08-15 MED ORDER — LORAZEPAM 2 MG/ML IJ SOLN: 1.0000 mg | Freq: Four times a day (QID) | INTRAMUSCULAR | Status: DC | PRN

## 2011-08-15 MED ORDER — LORAZEPAM 1 MG PO TABS
1.0000 mg | ORAL_TABLET | Freq: Four times a day (QID) | ORAL | Status: DC | PRN
  Administered 2011-08-15: 1 mg via ORAL
  Filled 2011-08-15: qty 1

## 2011-08-15 MED ORDER — VITAMIN B-1 100 MG PO TABS
100.0000 mg | ORAL_TABLET | Freq: Every day | ORAL | Status: DC
  Administered 2011-08-15: 100 mg via ORAL
  Filled 2011-08-15: qty 1

## 2011-08-15 NOTE — ED Notes (Signed)
Pt reports feeling shaky and jittery, nausea, reports seeing images "like im hallucinating". Also reports Si and states that he has a plan to harm himself.

## 2011-08-15 NOTE — ED Notes (Signed)
Pt states he wants to go to [facility] for detox.  Denies SI/HI.

## 2011-08-15 NOTE — ED Notes (Signed)
Sitter at bedside.

## 2011-08-15 NOTE — ED Notes (Signed)
Pt pulled out his IV upon wakening. Bleeding stopped and bandage applied.

## 2011-08-15 NOTE — ED Notes (Signed)
Pt states that he is suppose to take Celexa and Depakote,.

## 2011-08-15 NOTE — Progress Notes (Signed)
Pt's information has been sent to [facility] for review. Per Dennie Bible at [facility], pt's information has been received. Pat added that if the pt is accepted, transportation would not be available until after 11pm.

## 2011-08-15 NOTE — Consult Note (Signed)
Reason for Consult: Alcohol intoxication and dependence with the multiple previous treatments Referring Physician: Dr. Elicia Lamp Marte is an 46 y.o. male.  HPI: Patient was seen and chart reviewed. Patient has been suffering with the alcohol dependence for over several years and has a multiple acute psychiatric hospitalization for detox treatment and also alcohol rehabilitation services. [facility] [place] [facility], [facility], [facility], at least 4 times and [facility] at least 3 times. Patient reported he stopped taking his medication 4 days ago and has been relapsed on his drinking alcohol. He was arrived with blood alcohol level of 325. Patient was initially endorses suicidal ideation and a plan to cut his throat. Now he repeatedly saying he was not suicidal. He does not want to kill himself and he need help. Patient has a 106 years old daughter who stays with her mother in her [place] who came and saw him 2 weeks ago, since then he changed his mind and he want to really get help. At this time he feels he was ready to get help because he got rock bottom on his alcoholism. He was somewhat argumentative, irritable, and combative during this evaluation. Patient threatened to leave the hospital, if he does not get help. He was felt he was put down during this evaluation when confronted about his suicidal thoughts and repeated changes. Patient stated he was emberassing for him to keep on asking for the help, which he had been failing himself.  Past Medical History  Diagnosis Date  . Hypertension   . Alcoholism /alcohol abuse     Past Surgical History  Procedure Date  . Hernia repair     History reviewed. No pertinent family history.  Social History:  reports that he has been smoking Cigarettes.  He has a 30 pack-year smoking history. He does not have any smokeless tobacco history on file. He reports that he drinks alcohol. He reports  that he does not use illicit drugs.  Allergies: No Known Allergies  Medications: I have reviewed the patient's current medications.  Results for orders placed during the hospital encounter of 08/14/11 (from the past 48 hour(s))  URINE RAPID DRUG SCREEN (HOSP PERFORMED)     Status: Normal   Collection Time   08/14/11  9:42 PM      Component Value Range Comment   Opiates NONE DETECTED  NONE DETECTED    Cocaine NONE DETECTED  NONE DETECTED    Benzodiazepines NONE DETECTED  NONE DETECTED    Amphetamines NONE DETECTED  NONE DETECTED    Tetrahydrocannabinol NONE DETECTED  NONE DETECTED    Barbiturates NONE DETECTED  NONE DETECTED   CBC     Status: Normal   Collection Time   08/14/11  9:45 PM      Component Value Range Comment   WBC 4.6  4.0 - 10.5 K/uL    RBC 4.31  4.22 - 5.81 MIL/uL    Hemoglobin 14.0  13.0 - 17.0 g/dL    HCT 16.1  09.6 - 04.5 %    MCV 93.5  78.0 - 100.0 fL    MCH 32.5  26.0 - 34.0 pg    MCHC 34.7  30.0 - 36.0 g/dL    RDW 40.9  81.1 - 91.4 %    Platelets 198  150 - 400 K/uL   COMPREHENSIVE METABOLIC PANEL     Status: Abnormal   Collection Time   08/14/11  9:45 PM  Component Value Range Comment   Sodium 138  135 - 145 mEq/L    Potassium 3.3 (*) 3.5 - 5.1 mEq/L    Chloride 105  96 - 112 mEq/L    CO2 23  19 - 32 mEq/L    Glucose, Bld 92  70 - 99 mg/dL    BUN 4 (*) 6 - 23 mg/dL    Creatinine, Ser 1.61  0.50 - 1.35 mg/dL    Calcium 8.6  8.4 - 09.6 mg/dL    Total Protein 6.5  6.0 - 8.3 g/dL    Albumin 3.6  3.5 - 5.2 g/dL    AST 19  0 - 37 U/L    ALT 15  0 - 53 U/L    Alkaline Phosphatase 57  39 - 117 U/L    Total Bilirubin 0.2 (*) 0.3 - 1.2 mg/dL    GFR calc non Af Amer >90  >90 mL/min    GFR calc Af Amer >90  >90 mL/min   ETHANOL     Status: Abnormal   Collection Time   08/14/11  9:45 PM      Component Value Range Comment   Alcohol, Ethyl (B) 325 (*) 0 - 11 mg/dL   VALPROIC ACID LEVEL     Status: Abnormal   Collection Time   08/14/11  9:45 PM       Component Value Range Comment   Valproic Acid Lvl <10.0 (*) 50.0 - 100.0 ug/mL   PROTIME-INR     Status: Normal   Collection Time   08/14/11 10:15 PM      Component Value Range Comment   Prothrombin Time 13.8  11.6 - 15.2 seconds    INR 1.04  0.00 - 1.49   APTT     Status: Normal   Collection Time   08/14/11 10:15 PM      Component Value Range Comment   aPTT 30  24 - 37 seconds     Dg Chest 2 View  08/15/2011  *RADIOLOGY REPORT*  Clinical Data: Cough  CHEST - 2 VIEW  Comparison: 05/03/2010  Findings: Mild cardiomegaly again noted without edema. Lung volumes are low with crowding of the bronchovascular markings.  No pleural effusion.  No acute osseous abnormality.  IMPRESSION: Low volumes with crowding of the bronchovascular markings but no focal acute abnormality. If the patient's symptoms continue, consider PA and lateral chest radiographs obtained at full inspiration when the patient is clinically able.  Original Report Authenticated By: Harrel Lemon, M.D.    No psychosis and Positive for anxiety, bad mood, depression, excessive alcohol consumption and sleep disturbance Blood pressure 124/80, pulse 50, temperature 98.8 F (37.1 C), temperature source Oral, resp. rate 20, SpO2 96.00%.   Assessment/Plan: Alcohol intoxication Alcohol dependence Substance-induced mood disorder  Patient is requesting for the detox and rehabilitation services. Patient was declined at behavioral [facility] for maximizing his benefits and he will be referred to the other substance abuse treatment facilities. Patient denies current suicidal ideation and plans. He has no previous suicidal attempts.  Kathleene Bergemann,JANARDHAHA R. 08/15/2011, 4:40 PM

## 2011-08-15 NOTE — ED Notes (Signed)
Pt belongings in locker 41  
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
## 2011-08-16 NOTE — [facility] Counselor (Signed)
[facility]  [facility] driver will transport pt to facility for treatment.  Notified Dr. Nicanor Alcon of acceptance.

## 2011-08-24 ENCOUNTER — Emergency Department (HOSPITAL_COMMUNITY)
Admission: EM | Admit: 2011-08-24 | Discharge: 2011-08-24 | Disposition: A | Discharge status: Home / Self Care | Payer: Self-pay | Attending: Emergency Medicine | Admitting: Emergency Medicine

## 2011-08-24 ENCOUNTER — Encounter (HOSPITAL_COMMUNITY): Payer: Self-pay | Admitting: *Deleted

## 2011-08-24 DIAGNOSIS — F3289 Other specified depressive episodes: Secondary | ICD-10-CM | POA: Insufficient documentation

## 2011-08-24 DIAGNOSIS — F329 Major depressive disorder, single episode, unspecified: Secondary | ICD-10-CM | POA: Insufficient documentation

## 2011-08-24 DIAGNOSIS — F1010 Alcohol abuse, uncomplicated: Secondary | ICD-10-CM | POA: Insufficient documentation

## 2011-08-24 DIAGNOSIS — F17200 Nicotine dependence, unspecified, uncomplicated: Secondary | ICD-10-CM | POA: Insufficient documentation

## 2011-08-24 DIAGNOSIS — I10 Essential (primary) hypertension: Secondary | ICD-10-CM | POA: Insufficient documentation

## 2011-08-24 LAB — CBC WITH DIFFERENTIAL/PLATELET
Eosinophils Absolute: 0.3 10*3/uL (ref 0.0–0.7)
Eosinophils Relative: 6 % — ABNORMAL HIGH (ref 0–5)
Lymphs Abs: 1.7 10*3/uL (ref 0.7–4.0)
MCH: 32.4 pg (ref 26.0–34.0)
MCV: 92.5 fL (ref 78.0–100.0)
Platelets: 173 10*3/uL (ref 150–400)
RBC: 4.51 MIL/uL (ref 4.22–5.81)

## 2011-08-24 LAB — COMPREHENSIVE METABOLIC PANEL
ALT: 16 U/L (ref 0–53)
AST: 21 U/L (ref 0–37)
Albumin: 4 g/dL (ref 3.5–5.2)
Alkaline Phosphatase: 63 U/L (ref 39–117)
Chloride: 98 mEq/L (ref 96–112)
Creatinine, Ser: 0.71 mg/dL (ref 0.50–1.35)
Potassium: 3.2 mEq/L — ABNORMAL LOW (ref 3.5–5.1)
Sodium: 133 mEq/L — ABNORMAL LOW (ref 135–145)
Total Bilirubin: 0.4 mg/dL (ref 0.3–1.2)

## 2011-08-24 LAB — VALPROIC ACID LEVEL: Valproic Acid Lvl: <10 ug/mL — ABNORMAL LOW (ref 50.0–100.0)

## 2011-08-24 LAB — ETHANOL
Alcohol, Ethyl (B): 191 mg/dL — ABNORMAL HIGH (ref 0–11)
Alcohol, Ethyl (B): <11 mg/dL (ref 0–11)

## 2011-08-24 LAB — LIPASE, BLOOD: Lipase: 29 U/L (ref 11–59)

## 2011-08-24 LAB — RAPID URINE DRUG SCREEN, HOSP PERFORMED
Amphetamines: NOT DETECTED
Tetrahydrocannabinol: NOT DETECTED

## 2011-08-24 MED ORDER — CITALOPRAM HYDROBROMIDE 20 MG PO TABS
30.0000 mg | ORAL_TABLET | Freq: Every day | ORAL | Status: DC
  Administered 2011-08-24: 30 mg via ORAL
  Filled 2011-08-24: qty 1

## 2011-08-24 MED ORDER — ONDANSETRON HCL 4 MG PO TABS: 4.0000 mg | ORAL_TABLET | Freq: Three times a day (TID) | ORAL | Status: DC | PRN

## 2011-08-24 MED ORDER — IBUPROFEN 200 MG PO TABS
600.0000 mg | ORAL_TABLET | Freq: Three times a day (TID) | ORAL | Status: DC | PRN
  Administered 2011-08-24: 600 mg via ORAL
  Filled 2011-08-24: qty 3

## 2011-08-24 MED ORDER — ALUM & MAG HYDROXIDE-SIMETH 200-200-20 MG/5ML PO SUSP: 30.0000 mL | ORAL | Status: DC | PRN

## 2011-08-24 MED ORDER — NICOTINE 21 MG/24HR TD PT24
21.0000 mg | MEDICATED_PATCH | Freq: Every day | TRANSDERMAL | Status: DC
  Administered 2011-08-24: 21 mg via TRANSDERMAL
  Filled 2011-08-24: qty 1

## 2011-08-24 MED ORDER — THIAMINE HCL 100 MG/ML IJ SOLN: 100.0000 mg | Freq: Every day | INTRAMUSCULAR | Status: DC

## 2011-08-24 MED ORDER — TRAZODONE HCL 100 MG PO TABS
100.0000 mg | ORAL_TABLET | Freq: Every day | ORAL | Status: DC
Start: 2011-08-24 — End: 2011-08-24

## 2011-08-24 MED ORDER — LORAZEPAM 2 MG/ML IJ SOLN: 1.0000 mg | Freq: Four times a day (QID) | INTRAMUSCULAR | Status: DC | PRN

## 2011-08-24 MED ORDER — ACETAMINOPHEN 325 MG PO TABS
650.0000 mg | ORAL_TABLET | ORAL | Status: DC | PRN
  Administered 2011-08-24: 650 mg via ORAL
  Filled 2011-08-24: qty 2

## 2011-08-24 MED ORDER — LORAZEPAM 1 MG PO TABS: 0.0000 mg | ORAL_TABLET | Freq: Two times a day (BID) | ORAL | Status: DC

## 2011-08-24 MED ORDER — ADULT MULTIVITAMIN W/MINERALS CH
1.0000 | ORAL_TABLET | Freq: Every day | ORAL | Status: DC
  Administered 2011-08-24: 1 via ORAL
  Filled 2011-08-24: qty 1

## 2011-08-24 MED ORDER — LORAZEPAM 1 MG PO TABS
1.0000 mg | ORAL_TABLET | Freq: Four times a day (QID) | ORAL | Status: DC | PRN
  Administered 2011-08-24: 1 mg via ORAL
  Filled 2011-08-24: qty 1

## 2011-08-24 MED ORDER — FOLIC ACID 1 MG PO TABS
1.0000 mg | ORAL_TABLET | Freq: Every day | ORAL | Status: DC
  Administered 2011-08-24: 1 mg via ORAL
  Filled 2011-08-24: qty 1

## 2011-08-24 MED ORDER — ARIPIPRAZOLE 5 MG PO TABS
5.0000 mg | ORAL_TABLET | Freq: Every day | ORAL | Status: DC
  Administered 2011-08-24: 5 mg via ORAL
  Filled 2011-08-24: qty 1

## 2011-08-24 MED ORDER — POTASSIUM CHLORIDE CRYS ER 20 MEQ PO TBCR
40.0000 meq | EXTENDED_RELEASE_TABLET | Freq: Once | ORAL | Status: AC
  Administered 2011-08-24: 40 meq via ORAL
  Filled 2011-08-24 (×2): qty 1

## 2011-08-24 MED ORDER — DIVALPROEX SODIUM ER 500 MG PO TB24
750.0000 mg | ORAL_TABLET | Freq: Every day | ORAL | Status: DC
  Administered 2011-08-24: 750 mg via ORAL
  Filled 2011-08-24: qty 1

## 2011-08-24 MED ORDER — LORAZEPAM 1 MG PO TABS
0.0000 mg | ORAL_TABLET | Freq: Four times a day (QID) | ORAL | Status: DC
  Administered 2011-08-24: 2 mg via ORAL
  Filled 2011-08-24: qty 2

## 2011-08-24 MED ORDER — VITAMIN B-1 100 MG PO TABS
100.0000 mg | ORAL_TABLET | Freq: Every day | ORAL | Status: DC
  Administered 2011-08-24: 100 mg via ORAL
  Filled 2011-08-24: qty 1

## 2011-08-24 MED ORDER — LORAZEPAM 1 MG PO TABS: 1.0000 mg | ORAL_TABLET | Freq: Three times a day (TID) | ORAL | Status: DC | PRN
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
## 2011-08-24 NOTE — ED Notes (Signed)
Pt received from acute care. Pt stable on arrival. Pt c/o dizziness. Pt calm, skin pink dry and intact. Respirations even and unlabored. Pt in no distress. Sprite given per pt request.

## 2011-08-24 NOTE — ED Notes (Signed)
States head ache is still a "7", visible tremors in hands, states feels "dizzy" while in bed, has been sleeping for past 2 hours.

## 2011-08-24 NOTE — ED Provider Notes (Signed)
History     CSN: [id number]  Arrival date & time 08/24/11  4010   First MD Initiated Contact with Patient 08/24/11 0715      Chief Complaint  Patient presents with  . Medical Clearance  . Abdominal Pain    (Consider location/radiation/quality/duration/timing/severity/associated sxs/prior treatment) HPI Comments: Patient presents with alcohol intoxication and depression.  He notes a recurrent history of alcohol abuse.  He states he drinks approximately 12 40 ounce beers/day.  Patient denies that he is suicidal today.  He does endorse depression but states he's been compliant with his medications.  He notes that his mother died this year and he states that it has significantly affected him.  He does have family in the area that includes an uncle and a fiance.  He also has a 46 year old daughter.  The history is provided by the patient. No language interpreter was used.    Past Medical History  Diagnosis Date  . Hypertension   . Alcoholism /alcohol abuse     Past Surgical History  Procedure Date  . Hernia repair     History reviewed. No pertinent family history.  History  Substance Use Topics  . Smoking status: Current Everyday Smoker -- 1.0 packs/day for 30 years    Types: Cigarettes  . Smokeless tobacco: Not on file  . Alcohol Use: Yes     Drinks every day, 9-11 40 oz beers.      Review of Systems  Constitutional: Negative.  Negative for fever and chills.  HENT: Negative.   Eyes: Negative.   Respiratory: Negative.  Negative for cough and shortness of breath.   Cardiovascular: Negative.  Negative for chest pain.  Gastrointestinal: Positive for abdominal pain. Negative for nausea and vomiting.  Genitourinary: Negative.   Musculoskeletal: Negative.  Negative for back pain.  Skin: Negative.  Negative for color change and rash.  Neurological: Negative for headaches.  Hematological: Negative.  Negative for adenopathy.  Psychiatric/Behavioral: Positive for dysphoric  mood. Negative for confusion.  All other systems reviewed and are negative.    Allergies  Bee venom  Home Medications   Current Outpatient Rx  Name Route Sig Dispense Refill  . ARIPIPRAZOLE 5 MG PO TABS Oral Take 1 tablet (5 mg total) by mouth daily. For mood control 30 tablet 0  . CITALOPRAM HYDROBROMIDE 10 MG PO TABS Oral Take 30 mg by mouth daily. For depression    . DIVALPROEX SODIUM ER 250 MG PO TB24 Oral Take 3 tablets (750 mg total) by mouth daily. For mood stabilization 90 tablet 0  . TRAZODONE HCL 100 MG PO TABS Oral Take 1 tablet (100 mg total) by mouth at bedtime. For sleep 30 tablet 0    BP 104/66  Pulse 58  Temp 98 F (36.7 C)  Resp 20  SpO2 100%  Physical Exam  Nursing note and vitals reviewed. Constitutional: He is oriented to person, place, and time. He appears well-developed and well-nourished.  Non-toxic appearance. He does not have a sickly appearance.  HENT:  Head: Normocephalic and atraumatic.  Eyes: Conjunctivae, EOM and lids are normal. Pupils are equal, round, and reactive to light.  Neck: Trachea normal, normal range of motion and full passive range of motion without pain. Neck supple.  Cardiovascular: Normal rate, regular rhythm and normal heart sounds.   Pulmonary/Chest: Effort normal and breath sounds normal. No respiratory distress.  Abdominal: Soft. Normal appearance. He exhibits no distension. There is tenderness. There is no rebound and no CVA tenderness.  Mild epigastric tenderness  Musculoskeletal: Normal range of motion.  Neurological: He is alert and oriented to person, place, and time. He has normal strength.  Skin: Skin is warm, dry and intact. No rash noted.  Psychiatric: His behavior is normal. Judgment and thought content normal.       Depressed mood and affect    ED Course  Procedures (including critical care time)  Results for orders placed during the hospital encounter of 08/24/11  COMPREHENSIVE METABOLIC PANEL       Component Value Range   Sodium 133 (*) 135 - 145 mEq/L   Potassium 3.2 (*) 3.5 - 5.1 mEq/L   Chloride 98  96 - 112 mEq/L   CO2 21  19 - 32 mEq/L   Glucose, Bld 93  70 - 99 mg/dL   BUN 9  6 - 23 mg/dL   Creatinine, Ser 1.61  0.50 - 1.35 mg/dL   Calcium 8.8  8.4 - 09.6 mg/dL   Total Protein 7.4  6.0 - 8.3 g/dL   Albumin 4.0  3.5 - 5.2 g/dL   AST 21  0 - 37 U/L   ALT 16  0 - 53 U/L   Alkaline Phosphatase 63  39 - 117 U/L   Total Bilirubin 0.4  0.3 - 1.2 mg/dL   GFR calc non Af Amer >90  >90 mL/min   GFR calc Af Amer >90  >90 mL/min  CBC WITH DIFFERENTIAL      Component Value Range   WBC 4.2  4.0 - 10.5 K/uL   RBC 4.51  4.22 - 5.81 MIL/uL   Hemoglobin 14.6  13.0 - 17.0 g/dL   HCT 04.5  40.9 - 81.1 %   MCV 92.5  78.0 - 100.0 fL   MCH 32.4  26.0 - 34.0 pg   MCHC 35.0  30.0 - 36.0 g/dL   RDW 91.4  78.2 - 95.6 %   Platelets 173  150 - 400 K/uL   Neutrophils Relative 36 (*) 43 - 77 %   Neutro Abs 1.5 (*) 1.7 - 7.7 K/uL   Lymphocytes Relative 41  12 - 46 %   Lymphs Abs 1.7  0.7 - 4.0 K/uL   Monocytes Relative 17 (*) 3 - 12 %   Monocytes Absolute 0.7  0.1 - 1.0 K/uL   Eosinophils Relative 6 (*) 0 - 5 %   Eosinophils Absolute 0.3  0.0 - 0.7 K/uL   Basophils Relative 1  0 - 1 %   Basophils Absolute 0.0  0.0 - 0.1 K/uL  ETHANOL      Component Value Range   Alcohol, Ethyl (B) 191 (*) 0 - 11 mg/dL  URINE RAPID DRUG SCREEN (HOSP PERFORMED)      Component Value Range   Opiates NONE DETECTED  NONE DETECTED   Cocaine NONE DETECTED  NONE DETECTED   Benzodiazepines NONE DETECTED  NONE DETECTED   Amphetamines NONE DETECTED  NONE DETECTED   Tetrahydrocannabinol NONE DETECTED  NONE DETECTED   Barbiturates NONE DETECTED  NONE DETECTED    1. Alcohol abuse   2. Depression       MDM  Patient with a history of depression and alcohol abuse.  He is currently requesting an alcohol rehabilitation program.  He denies that he is suicidal or that he's done anything to harm himself at this time.   I will place the patient on a CIWA protocol and contact the [facility] behavioral health team.       Nicholos Johns  Nicola Girt, MD 08/24/11 [phone]

## 2011-08-24 NOTE — ED Notes (Signed)
Pt and belongings wanded 

## 2011-08-24 NOTE — ED Notes (Signed)
Pt states he had 17 40 oz beers tonight; requesting treatment
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
## 2011-08-24 NOTE — ED Notes (Signed)
Pt escorted to discharge window, bus pass given

## 2011-08-24 NOTE — ED Notes (Signed)
Pt changed into blue scrubs and belongings placed in bag

## 2011-08-24 NOTE — ED Notes (Signed)
Pt c/o  dizziness without activity. RN [name] notified.

## 2011-10-15 ENCOUNTER — Emergency Department (HOSPITAL_COMMUNITY)
Admission: EM | Admit: 2011-10-15 | Discharge: 2011-10-18 | Disposition: A | Discharge status: Home / Self Care | Payer: Federal, State, Local not specified - Other | Attending: Emergency Medicine | Admitting: Emergency Medicine

## 2011-10-15 ENCOUNTER — Emergency Department (HOSPITAL_COMMUNITY): Payer: No Typology Code available for payment source

## 2011-10-15 DIAGNOSIS — R109 Unspecified abdominal pain: Secondary | ICD-10-CM | POA: Insufficient documentation

## 2011-10-15 DIAGNOSIS — F17200 Nicotine dependence, unspecified, uncomplicated: Secondary | ICD-10-CM | POA: Insufficient documentation

## 2011-10-15 DIAGNOSIS — F10929 Alcohol use, unspecified with intoxication, unspecified: Secondary | ICD-10-CM

## 2011-10-15 DIAGNOSIS — F1010 Alcohol abuse, uncomplicated: Secondary | ICD-10-CM | POA: Insufficient documentation

## 2011-10-15 DIAGNOSIS — I10 Essential (primary) hypertension: Secondary | ICD-10-CM | POA: Insufficient documentation

## 2011-10-15 DIAGNOSIS — R10816 Epigastric abdominal tenderness: Secondary | ICD-10-CM | POA: Insufficient documentation

## 2011-10-15 LAB — COMPREHENSIVE METABOLIC PANEL
ALT: 15 U/L (ref 0–53)
AST: 22 U/L (ref 0–37)
Alkaline Phosphatase: 66 U/L (ref 39–117)
CO2: 26 mEq/L (ref 19–32)
Chloride: 102 mEq/L (ref 96–112)
GFR calc Af Amer: >90 mL/min (ref >90)
GFR calc non Af Amer: >90 mL/min (ref >90)
Glucose, Bld: 99 mg/dL (ref 70–99)
Potassium: 3.5 mEq/L (ref 3.5–5.1)
Sodium: 138 mEq/L (ref 135–145)

## 2011-10-15 LAB — CBC WITH DIFFERENTIAL/PLATELET
Basophils Absolute: 0 10*3/uL (ref 0.0–0.1)
Lymphocytes Relative: 43 % (ref 12–46)
Lymphs Abs: 2.1 10*3/uL (ref 0.7–4.0)
MCV: 92.1 fL (ref 78.0–100.0)
Neutro Abs: 2 10*3/uL (ref 1.7–7.7)
Neutrophils Relative %: 39 % — ABNORMAL LOW (ref 43–77)
Platelets: 204 10*3/uL (ref 150–400)
RBC: 4.56 MIL/uL (ref 4.22–5.81)
RDW: 12.8 % (ref 11.5–15.5)
WBC: 5 10*3/uL (ref 4.0–10.5)

## 2011-10-15 LAB — ETHANOL: Alcohol, Ethyl (B): 279 mg/dL — ABNORMAL HIGH (ref 0–11)

## 2011-10-15 LAB — URINALYSIS, ROUTINE W REFLEX MICROSCOPIC
Bilirubin Urine: NEGATIVE
Hgb urine dipstick: NEGATIVE
Ketones, ur: NEGATIVE mg/dL
Nitrite: NEGATIVE
Specific Gravity, Urine: 1.007 (ref 1.005–1.030)
Urobilinogen, UA: 0.2 mg/dL (ref 0.0–1.0)
pH: 6 (ref 5.0–8.0)

## 2011-10-15 LAB — RAPID URINE DRUG SCREEN, HOSP PERFORMED
Amphetamines: NOT DETECTED
Benzodiazepines: NOT DETECTED
Opiates: NOT DETECTED
Tetrahydrocannabinol: NOT DETECTED

## 2011-10-15 MED ORDER — ONDANSETRON HCL 4 MG PO TABS: 4.0000 mg | ORAL_TABLET | Freq: Three times a day (TID) | ORAL | Status: DC | PRN

## 2011-10-15 MED ORDER — LORAZEPAM 2 MG/ML IJ SOLN: 1.0000 mg | Freq: Four times a day (QID) | INTRAMUSCULAR | Status: DC | PRN

## 2011-10-15 MED ORDER — VITAMIN B-1 100 MG PO TABS
100.0000 mg | ORAL_TABLET | Freq: Every day | ORAL | Status: DC
  Administered 2011-10-16 – 2011-10-18 (×4): 100 mg via ORAL
  Filled 2011-10-15 (×4): qty 1

## 2011-10-15 MED ORDER — ONDANSETRON HCL 4 MG/2ML IJ SOLN
4.0000 mg | Freq: Once | INTRAMUSCULAR | Status: AC
  Administered 2011-10-15: 4 mg via INTRAVENOUS
  Filled 2011-10-15: qty 2

## 2011-10-15 MED ORDER — SODIUM CHLORIDE 0.9 % IV BOLUS (SEPSIS)
1000.0000 mL | Freq: Once | INTRAVENOUS | Status: AC
  Administered 2011-10-15: 1000 mL via INTRAVENOUS

## 2011-10-15 MED ORDER — GI COCKTAIL ~~LOC~~
30.0000 mL | Freq: Once | ORAL | Status: AC
  Administered 2011-10-15: 30 mL via ORAL
  Filled 2011-10-15: qty 30

## 2011-10-15 MED ORDER — SODIUM CHLORIDE 0.9 % IV SOLN
INTRAVENOUS | Status: DC
  Administered 2011-10-16 (×3): via INTRAVENOUS

## 2011-10-15 MED ORDER — ADULT MULTIVITAMIN W/MINERALS CH
1.0000 | ORAL_TABLET | Freq: Every day | ORAL | Status: DC
  Administered 2011-10-16 – 2011-10-18 (×4): 1 via ORAL
  Filled 2011-10-15 (×4): qty 1

## 2011-10-15 MED ORDER — ALUM & MAG HYDROXIDE-SIMETH 200-200-20 MG/5ML PO SUSP: 30.0000 mL | ORAL | Status: DC | PRN

## 2011-10-15 MED ORDER — NICOTINE 21 MG/24HR TD PT24
21.0000 mg | MEDICATED_PATCH | Freq: Every day | TRANSDERMAL | Status: DC
  Administered 2011-10-16 – 2011-10-17 (×3): 21 mg via TRANSDERMAL
  Filled 2011-10-15 (×4): qty 1

## 2011-10-15 MED ORDER — ACETAMINOPHEN 325 MG PO TABS
650.0000 mg | ORAL_TABLET | ORAL | Status: DC | PRN
  Administered 2011-10-16 (×2): 650 mg via ORAL
  Filled 2011-10-15 (×2): qty 2

## 2011-10-15 MED ORDER — IBUPROFEN 600 MG PO TABS
600.0000 mg | ORAL_TABLET | Freq: Three times a day (TID) | ORAL | Status: DC | PRN
  Administered 2011-10-18: 600 mg via ORAL
  Filled 2011-10-15: qty 1

## 2011-10-15 MED ORDER — THIAMINE HCL 100 MG/ML IJ SOLN: 100.0000 mg | Freq: Every day | INTRAMUSCULAR | Status: DC

## 2011-10-15 MED ORDER — LORAZEPAM 1 MG PO TABS
0.0000 mg | ORAL_TABLET | Freq: Two times a day (BID) | ORAL | Status: DC
  Administered 2011-10-17: 2 mg via ORAL
  Administered 2011-10-18: 1 mg via ORAL
  Filled 2011-10-15: qty 2

## 2011-10-15 MED ORDER — LORAZEPAM 1 MG PO TABS
1.0000 mg | ORAL_TABLET | Freq: Four times a day (QID) | ORAL | Status: DC | PRN
  Administered 2011-10-16: 1 mg via ORAL
  Filled 2011-10-15 (×2): qty 2
  Filled 2011-10-15 (×2): qty 1

## 2011-10-15 MED ORDER — LORAZEPAM 1 MG PO TABS
0.0000 mg | ORAL_TABLET | Freq: Four times a day (QID) | ORAL | Status: AC
  Administered 2011-10-16: 1 mg via ORAL
  Administered 2011-10-16: 2 mg via ORAL
  Administered 2011-10-16: 1 mg via ORAL
  Administered 2011-10-16 – 2011-10-17 (×5): 2 mg via ORAL
  Filled 2011-10-15 (×2): qty 2
  Filled 2011-10-15: qty 1
  Filled 2011-10-15 (×2): qty 2

## 2011-10-15 MED ORDER — FAMOTIDINE 20 MG PO TABS
40.0000 mg | ORAL_TABLET | Freq: Once | ORAL | Status: AC
  Administered 2011-10-15: 40 mg via ORAL
  Filled 2011-10-15: qty 2

## 2011-10-15 MED ORDER — FOLIC ACID 1 MG PO TABS
1.0000 mg | ORAL_TABLET | Freq: Every day | ORAL | Status: DC
  Administered 2011-10-16 – 2011-10-18 (×4): 1 mg via ORAL
  Filled 2011-10-15 (×4): qty 1

## 2011-10-15 NOTE — ED Notes (Signed)
[id number]> Expected date:<BR> Expected time:<BR> Means of arrival:<BR> Comments:<BR> Rm 9, Medic, 46 M, ETOH, Abd Pain

## 2011-10-15 NOTE — [facility] Counselor (Signed)
Pt unable to stay awake for assessment. Will attempt assessment in am.

## 2011-10-15 NOTE — ED Provider Notes (Signed)
History     CSN: [id number]  Arrival date & time 10/15/11  1953   First MD Initiated Contact with Patient 10/15/11 2027      Chief Complaint  Patient presents with  . Abdominal Pain  . Drug / Alcohol Assessment    (Consider location/radiation/quality/duration/timing/severity/associated sxs/prior treatment) Patient is a 46 y.o. male presenting with abdominal pain and drug/alcohol assessment. The history is provided by the patient.  Abdominal Pain The primary symptoms of the illness include abdominal pain.  Drug / Alcohol Assessment   patient here with abdominal pain secondary to copious amounts of alcohol use today. States that he's drank approximately 15  40 ounce beers. Notes nausea without vomiting. Pain is burning and located in his epigastric area without radiation. Denies any bloody stools. States he wants help with his alcohol addiction. Denies any suicidal or homicidal ideations.  Past Medical History  Diagnosis Date  . Hypertension   . Alcoholism /alcohol abuse     Past Surgical History  Procedure Date  . Hernia repair     No family history on file.  History  Substance Use Topics  . Smoking status: Current Every Day Smoker -- 1.0 packs/day for 30 years    Types: Cigarettes  . Smokeless tobacco: Not on file  . Alcohol Use: Yes     Drinks every day, 9-11 40 oz beers.      Review of Systems  Gastrointestinal: Positive for abdominal pain.  All other systems reviewed and are negative.    Allergies  Bee venom and Coffee bean extract  Home Medications   Current Outpatient Rx  Name Route Sig Dispense Refill  . CITALOPRAM HYDROBROMIDE 10 MG PO TABS Oral Take 30 mg by mouth daily. For depression    . DIVALPROEX SODIUM ER 250 MG PO TB24 Oral Take 3 tablets (750 mg total) by mouth daily. For mood stabilization 90 tablet 0  . ARIPIPRAZOLE 5 MG PO TABS Oral Take 1 tablet (5 mg total) by mouth daily. For mood control 30 tablet 0  . TRAZODONE HCL 100 MG PO TABS  Oral Take 1 tablet (100 mg total) by mouth at bedtime. For sleep 30 tablet 0    BP 117/77  Pulse 70  Temp 98 F (36.7 C) (Oral)  Resp 16  Ht 5\' 5"  (1.651 m)  Wt 205 lb (92.987 kg)  BMI 34.11 kg/m2  SpO2 99%  Physical Exam  Nursing note and vitals reviewed. Constitutional: He is oriented to person, place, and time. He appears well-developed and well-nourished.  Non-toxic appearance. No distress.  HENT:  Head: Normocephalic and atraumatic.  Eyes: Conjunctivae normal, EOM and lids are normal. Pupils are equal, round, and reactive to light.  Neck: Normal range of motion. Neck supple. No tracheal deviation present. No mass present.  Cardiovascular: Normal rate, regular rhythm and normal heart sounds.  Exam reveals no gallop.   No murmur heard. Pulmonary/Chest: Effort normal and breath sounds normal. No stridor. No respiratory distress. He has no decreased breath sounds. He has no wheezes. He has no rhonchi. He has no rales.  Abdominal: Soft. Normal appearance and bowel sounds are normal. He exhibits no distension. There is tenderness in the epigastric area. There is no rigidity, no rebound, no guarding and no CVA tenderness.  Musculoskeletal: Normal range of motion. He exhibits no edema and no tenderness.  Neurological: He is alert and oriented to person, place, and time. He has normal strength. No cranial nerve deficit or sensory deficit. GCS eye subscore  is 4. GCS verbal subscore is 5. GCS motor subscore is 6.  Skin: Skin is warm and dry. No abrasion and no rash noted.  Psychiatric: He has a normal mood and affect. His speech is normal and behavior is normal.    ED Course  Procedures (including critical care time)   Labs Reviewed  CBC WITH DIFFERENTIAL  COMPREHENSIVE METABOLIC PANEL  URINALYSIS, ROUTINE W REFLEX MICROSCOPIC  LIPASE, BLOOD  ETHANOL  URINE RAPID DRUG SCREEN (HOSP PERFORMED)   No results found.   No diagnosis found.    MDM  Patient with likely gastritis.  No signs of pancreatitis at this time. He is requesting detox from alcohol.        Toy Baker, MD 10/15/11 2125

## 2011-10-15 NOTE — ED Notes (Signed)
Pt in via EMS. Pt is homeless. EMS reports pt c/o abd pain and wanting detox from ETOH.

## 2011-10-15 NOTE — ED Notes (Signed)
Pt reports SI/HI. AC called for sitter. No sitter available. Security called to be wanded.

## 2011-10-16 NOTE — ED Notes (Signed)
[facility] team called. Pt ready for consult.

## 2011-10-16 NOTE — ED Provider Notes (Signed)
12:42 PM Patient states that he is comfortable.   VSS  Per RN, no new complaints.  Placement pending.  Gerhard Munch, MD 10/16/11 1242

## 2011-10-16 NOTE — ED Notes (Signed)
AC called again. No sitter available. Pt door open and no curtains closed the whole time.
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
## 2011-10-16 NOTE — ED Notes (Signed)
Dr Jeraldine Loots was in to see pt.
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
## 2011-10-16 NOTE — ED Notes (Signed)
ACT team consult completed.

## 2011-10-17 ENCOUNTER — Encounter (HOSPITAL_COMMUNITY): Payer: Self-pay | Admitting: Emergency Medicine

## 2011-10-17 NOTE — ED Notes (Signed)
Tele psych consult done, RN at bedside

## 2011-10-17 NOTE — ED Provider Notes (Signed)
7:35 AM  Filed Vitals:   10/17/11 0629  BP: 130/72  Pulse: 62  Temp: 98 F (36.7 C)  Resp: 18   Pt seen and assessed. NAD. No complaints this am. Pt still pending placement.  Raeford Razor, MD 10/17/11 [phone]

## 2011-10-18 DIAGNOSIS — F1010 Alcohol abuse, uncomplicated: Secondary | ICD-10-CM

## 2011-10-18 MED ORDER — CITALOPRAM HYDROBROMIDE 20 MG PO TABS
60.0000 mg | ORAL_TABLET | ORAL | Status: DC
  Administered 2011-10-18: 60 mg via ORAL
  Filled 2011-10-18 (×2): qty 1

## 2011-10-18 NOTE — ED Provider Notes (Signed)
Pt alert, content, vitals normal. No tremor, no nv.  The psychiatrist, Dr Shela Commons has reevaluated pt and feels psychiatrically stable for d/c. Will provide community resources for etoh/substance abuse rehab.   Suzi Roots, MD 10/18/11 [phone]

## 2011-10-18 NOTE — ED Provider Notes (Signed)
Vitals ok, increased celexa per recommendations, awaiting dispo.  No complaints.    Gavin Pound. Oletta Lamas, MD 10/18/11 [phone]

## 2011-10-18 NOTE — Consult Note (Signed)
Reason for Consult: Alcohol intoxication and dependence, substance-induced mood disorder Referring Physician: Dr. Theodoro Parma is an 46 y.o. male.  HPI: Patient was seen and chart reviewed. Patient was presented to the [facility] long emergency department voluntarily through the emergency medical services for alcohol detox treatment on 10/16/2011. Patient has successfully completed his detox treatment in the [facility] long emergency department. Patient has no symptoms of depression, anxiety or psychosis. Patient was appeared staying in his bed and watching television without distress. Patient has received multiple alcohol detox treatment at least 5 times from the [facility] and 3 times from [facility] and [facility] [place]. Patient reported he tried to quit his alcohol, stays about 2-3 weeks sober every single time. He receives a detox treatment. Patient has no suicidal or homicidal ideations, intentions, or plans. Patient can be manipulative making statements like, suicidal thoughts when he has no place to live. Patient main problem was a psychosocial support, and a place to live.  Past Medical History  Diagnosis Date  . Hypertension   . Alcoholism /alcohol abuse     Past Surgical History  Procedure Date  . Hernia repair     History reviewed. No pertinent family history.  Social History:  reports that he has been smoking Cigarettes.  He has a 30 pack-year smoking history. He does not have any smokeless tobacco history on file. He reports that he drinks alcohol. He reports that he does not use illicit drugs.  Allergies:  Allergies  Allergen Reactions  . Bee Venom Anaphylaxis    Uses epi pen-- last used 08/23/11  . Coffee Bean Extract (Coffea Arabica) Anaphylaxis    Medications: I have reviewed the patient's current medications.  No results found for this or any previous visit (from the  past 48 hour(s)).  No results found.  No depression, No anxiety, No psychosis and Positive for anxiety, bad mood and excessive alcohol consumption Blood pressure 142/71, pulse 58, temperature 98.4 F (36.9 C), temperature source Oral, resp. rate 18, height 5\' 5"  (1.651 m), weight 205 lb (92.987 kg), SpO2 97.00%.   Assessment/Plan: Alcohol intoxication ,abuse versus dependence Homelessness.  Patient does not have meet criteria for acute psychiatric hospitalization. Patient does not have symptoms of for alcohol withdrawal and has successfully completed his detox treatment. Patient will be referred to the substance abuse rehabilitation services. Patient stated he preferred to go [facility] or [facility], but he does not have a means of going there. His current medications are Celexa 60 mg daily morning, and Thiamine 100 mg tablets daily. No medication changes made during this visit.  Sandhya Denherder,JANARDHAHA R. 10/18/2011, 6:04 PM

## 2011-11-02 ENCOUNTER — Emergency Department (HOSPITAL_COMMUNITY)
Admission: EM | Admit: 2011-11-02 | Discharge: 2011-11-04 | Disposition: A | Discharge status: Home / Self Care | Payer: Self-pay | Attending: Emergency Medicine | Admitting: Emergency Medicine

## 2011-11-02 ENCOUNTER — Encounter (HOSPITAL_COMMUNITY): Payer: Self-pay | Admitting: *Deleted

## 2011-11-02 DIAGNOSIS — R4589 Other symptoms and signs involving emotional state: Secondary | ICD-10-CM

## 2011-11-02 DIAGNOSIS — F329 Major depressive disorder, single episode, unspecified: Secondary | ICD-10-CM | POA: Insufficient documentation

## 2011-11-02 DIAGNOSIS — Z79899 Other long term (current) drug therapy: Secondary | ICD-10-CM | POA: Insufficient documentation

## 2011-11-02 DIAGNOSIS — I10 Essential (primary) hypertension: Secondary | ICD-10-CM | POA: Insufficient documentation

## 2011-11-02 DIAGNOSIS — R45851 Suicidal ideations: Secondary | ICD-10-CM | POA: Insufficient documentation

## 2011-11-02 DIAGNOSIS — F3289 Other specified depressive episodes: Secondary | ICD-10-CM | POA: Insufficient documentation

## 2011-11-02 DIAGNOSIS — F1010 Alcohol abuse, uncomplicated: Secondary | ICD-10-CM

## 2011-11-02 LAB — CBC WITH DIFFERENTIAL/PLATELET
Basophils Absolute: 0 K/uL (ref 0.0–0.1)
Basophils Relative: 1 % (ref 0–1)
Eosinophils Absolute: 0.3 K/uL (ref 0.0–0.7)
Eosinophils Relative: 5 % (ref 0–5)
HCT: 43.5 % (ref 39.0–52.0)
Hemoglobin: 15.8 g/dL (ref 13.0–17.0)
Lymphocytes Relative: 52 % — ABNORMAL HIGH (ref 12–46)
Lymphs Abs: 2.6 K/uL (ref 0.7–4.0)
MCH: 33.6 pg (ref 26.0–34.0)
MCHC: 36.3 g/dL — ABNORMAL HIGH (ref 30.0–36.0)
MCV: 92.6 fL (ref 78.0–100.0)
Monocytes Absolute: 0.5 K/uL (ref 0.1–1.0)
Monocytes Relative: 10 % (ref 3–12)
Neutro Abs: 1.7 K/uL (ref 1.7–7.7)
Neutrophils Relative %: 33 % — ABNORMAL LOW (ref 43–77)
Platelets: 207 K/uL (ref 150–400)
RBC: 4.7 MIL/uL (ref 4.22–5.81)
RDW: 13.1 % (ref 11.5–15.5)
WBC: 5.1 K/uL (ref 4.0–10.5)

## 2011-11-02 LAB — BASIC METABOLIC PANEL
BUN: 4 mg/dL — ABNORMAL LOW (ref 6–23)
CO2: 26 mEq/L (ref 19–32)
Chloride: 104 mEq/L (ref 96–112)
Creatinine, Ser: 0.85 mg/dL (ref 0.50–1.35)

## 2011-11-02 LAB — VALPROIC ACID LEVEL: Valproic Acid Lvl: <10 ug/mL — ABNORMAL LOW (ref 50.0–100.0)

## 2011-11-02 LAB — RAPID URINE DRUG SCREEN, HOSP PERFORMED
Amphetamines: NOT DETECTED
Barbiturates: NOT DETECTED
Benzodiazepines: NOT DETECTED
Cocaine: NOT DETECTED
Opiates: NOT DETECTED
Tetrahydrocannabinol: NOT DETECTED

## 2011-11-02 MED ORDER — NICOTINE 21 MG/24HR TD PT24
21.0000 mg | MEDICATED_PATCH | Freq: Every day | TRANSDERMAL | Status: DC
  Administered 2011-11-02: 21 mg via TRANSDERMAL
  Filled 2011-11-02: qty 1

## 2011-11-02 MED ORDER — ARIPIPRAZOLE 5 MG PO TABS
5.0000 mg | ORAL_TABLET | Freq: Every day | ORAL | Status: DC
  Administered 2011-11-02 – 2011-11-04 (×3): 5 mg via ORAL
  Filled 2011-11-02 (×4): qty 1

## 2011-11-02 MED ORDER — LORAZEPAM 1 MG PO TABS
1.0000 mg | ORAL_TABLET | Freq: Three times a day (TID) | ORAL | Status: DC | PRN
  Administered 2011-11-03 (×2): 1 mg via ORAL
  Filled 2011-11-02 (×2): qty 1

## 2011-11-02 MED ORDER — ACETAMINOPHEN 325 MG PO TABS: 650.0000 mg | ORAL_TABLET | ORAL | Status: DC | PRN

## 2011-11-02 MED ORDER — IBUPROFEN 200 MG PO TABS
600.0000 mg | ORAL_TABLET | Freq: Three times a day (TID) | ORAL | Status: DC | PRN
  Administered 2011-11-03: 600 mg via ORAL
  Filled 2011-11-02: qty 3

## 2011-11-02 MED ORDER — ZOLPIDEM TARTRATE 5 MG PO TABS: 5.0000 mg | ORAL_TABLET | Freq: Every evening | ORAL | Status: DC | PRN

## 2011-11-02 MED ORDER — TRAZODONE HCL 100 MG PO TABS
100.0000 mg | ORAL_TABLET | Freq: Every day | ORAL | Status: DC
  Administered 2011-11-02 – 2011-11-03 (×2): 100 mg via ORAL
  Filled 2011-11-02 (×2): qty 1

## 2011-11-02 MED ORDER — CITALOPRAM HYDROBROMIDE 20 MG PO TABS
30.0000 mg | ORAL_TABLET | Freq: Every day | ORAL | Status: DC
  Administered 2011-11-02 – 2011-11-04 (×3): 30 mg via ORAL
  Filled 2011-11-02 (×4): qty 1

## 2011-11-02 MED ORDER — ONDANSETRON HCL 4 MG PO TABS
4.0000 mg | ORAL_TABLET | Freq: Three times a day (TID) | ORAL | Status: DC | PRN
  Administered 2011-11-03: 4 mg via ORAL
  Filled 2011-11-02: qty 1

## 2011-11-02 MED ORDER — DIVALPROEX SODIUM ER 500 MG PO TB24
750.0000 mg | ORAL_TABLET | Freq: Every day | ORAL | Status: DC
  Administered 2011-11-02 – 2011-11-04 (×3): 750 mg via ORAL
  Filled 2011-11-02 (×4): qty 1

## 2011-11-02 NOTE — ED Notes (Signed)
Pt reports abdominal pain - hx of ulcers and hiatal hernia

## 2011-11-02 NOTE — ED Notes (Signed)
Patient wanded by security.  He has two personal belonging bags at the desk.

## 2011-11-02 NOTE — ED Notes (Signed)
Patient has been put in blue scrubs and red socks

## 2011-11-02 NOTE — ED Notes (Signed)
AC called, sitter on way

## 2011-11-02 NOTE — ED Notes (Signed)
Per ems: pt found at bosses house on lawn, has been drinking since 0400 this morning. Reports drinking 19 40oz since 4am. Pt yelling that he is suicidal, plans to cut his throat, wants to die. Appears anxious, paranoid, tearful. Pt reports he lost his mother recently. bp 122/68, pulse 80, cbg 83.
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
## 2011-11-03 ENCOUNTER — Encounter (HOSPITAL_COMMUNITY): Payer: Self-pay | Admitting: *Deleted

## 2011-11-03 DIAGNOSIS — F10988 Alcohol use, unspecified with other alcohol-induced disorder: Secondary | ICD-10-CM

## 2011-11-03 DIAGNOSIS — F1020 Alcohol dependence, uncomplicated: Secondary | ICD-10-CM
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
## 2011-11-03 NOTE — ED Notes (Signed)
Specialist on call contacted 

## 2011-11-03 NOTE — ED Notes (Signed)
Pt is a little bit sweaty, clammy, denies any other complaints at this time.

## 2011-11-03 NOTE — Consult Note (Signed)
Reason for Consult: Alcohol withdrawal symptoms and intoxication Referring Physician: Dr. Jeraldine Loots who  [name] is an 46 y.o. male.  HPI: Patient was seen and chart reviewed. Patient was known to this provider from his previous the [facility] long emergency department visits for the alcohol intoxication and withdrawal symptoms. Patient was relapsed after a month and requesting to be detox treatment. His a blood alcohol level was 308 on arrival. Patient is having sweating, mild tremors, and nausea. Patient stated his grandmother was able to quit smoking tobacco cold [place] and is questioning why he cannot quit drinking alcohol. Patient has been drinking alcohol for more than 32 years and has tried multiple acute psychiatric hospitalization for detox and also rehabilitation services. Patient has denied current symptoms of depression, anxiety, psychotic symptoms. Patient has denied suicidal ideation, intentions and and plans. Patient denied homicidal ideation, intentions and plans.  Past Medical History  Diagnosis Date  . Hypertension   . Alcoholism /alcohol abuse     Past Surgical History  Procedure Date  . Hernia repair     History reviewed. No pertinent family history.  Social History:  reports that he has been smoking Cigarettes.  He has a 30 pack-year smoking history. He does not have any smokeless tobacco history on file. He reports that he drinks alcohol. He reports that he does not use illicit drugs.  Allergies:  Allergies  Allergen Reactions  . Bee Venom Anaphylaxis    Uses epi pen-- last used 08/23/11  . Coffee Bean Extract (Coffea Arabica) Anaphylaxis    Medications: I have reviewed the patient's current medications.  Results for orders placed during the hospital encounter of 11/02/11 (from the past 48 hour(s))  CBC WITH DIFFERENTIAL     Status: Abnormal   Collection Time   11/02/11  7:37 PM      Component Value Range Comment   WBC 5.1  4.0 - 10.5 K/uL    RBC 4.70  4.22 -  5.81 MIL/uL    Hemoglobin 15.8  13.0 - 17.0 g/dL    HCT 16.1  09.6 - 04.5 %    MCV 92.6  78.0 - 100.0 fL    MCH 33.6  26.0 - 34.0 pg    MCHC 36.3 (*) 30.0 - 36.0 g/dL    RDW 40.9  81.1 - 91.4 %    Platelets 207  150 - 400 K/uL    Neutrophils Relative 33 (*) 43 - 77 %    Neutro Abs 1.7  1.7 - 7.7 K/uL    Lymphocytes Relative 52 (*) 12 - 46 %    Lymphs Abs 2.6  0.7 - 4.0 K/uL    Monocytes Relative 10  3 - 12 %    Monocytes Absolute 0.5  0.1 - 1.0 K/uL    Eosinophils Relative 5  0 - 5 %    Eosinophils Absolute 0.3  0.0 - 0.7 K/uL    Basophils Relative 1  0 - 1 %    Basophils Absolute 0.0  0.0 - 0.1 K/uL   BASIC METABOLIC PANEL     Status: Abnormal   Collection Time   11/02/11  7:37 PM      Component Value Range Comment   Sodium 141  135 - 145 mEq/L    Potassium 3.8  3.5 - 5.1 mEq/L    Chloride 104  96 - 112 mEq/L    CO2 26  19 - 32 mEq/L    Glucose, Bld 86  70 - 99 mg/dL  BUN 4 (*) 6 - 23 mg/dL    Creatinine, Ser 7.82  0.50 - 1.35 mg/dL    Calcium 8.8  8.4 - 95.6 mg/dL    GFR calc non Af Amer >90  >90 mL/min    GFR calc Af Amer >90  >90 mL/min   ETHANOL     Status: Abnormal   Collection Time   11/02/11  7:37 PM      Component Value Range Comment   Alcohol, Ethyl (B) 308 (*) 0 - 11 mg/dL   VALPROIC ACID LEVEL     Status: Abnormal   Collection Time   11/02/11  7:37 PM      Component Value Range Comment   Valproic Acid Lvl <10.0 (*) 50.0 - 100.0 ug/mL   URINE RAPID DRUG SCREEN (HOSP PERFORMED)     Status: Normal   Collection Time   11/02/11  8:01 PM      Component Value Range Comment   Opiates NONE DETECTED  NONE DETECTED    Cocaine NONE DETECTED  NONE DETECTED    Benzodiazepines NONE DETECTED  NONE DETECTED    Amphetamines NONE DETECTED  NONE DETECTED    Tetrahydrocannabinol NONE DETECTED  NONE DETECTED    Barbiturates NONE DETECTED  NONE DETECTED     No results found.  Positive for anxiety, bad mood, excessive alcohol consumption, sleep disturbance and tobacco  use Blood pressure 117/73, pulse 68, temperature 98.6 F (37 C), temperature source Oral, resp. rate 16, SpO2 98.00%.   Assessment/Plan: Alcohol dependence and withdrawal  Substance induced mood disorder   Recommended acute psychiatric hospitalization for detox treatment and possibly rehabilitation services as a disposition. He will continue his current treatment.  [name],JANARDHAHA R. 11/03/2011, 5:47 PM

## 2011-11-03 NOTE — ED Notes (Signed)
Pt is asleep. Respirations even and unlabored.  

## 2011-11-03 NOTE — ED Provider Notes (Signed)
On my AM rounds the patient is asleep.   Per RN, no overnight events.  Gerhard Munch, MD 11/03/11 [phone]

## 2011-11-03 NOTE — ED Notes (Signed)
Pt had telepsych. Pt pysch cleared. telepysch recommends "admit to detox/rehab".  Upon rn assessment pt denies SI/HI. Pt reports minor depression, but no thoughts or desires to hurt himself.

## 2011-11-04 NOTE — ED Provider Notes (Signed)
ACT team called. Patient was cleared by telepsych yesterday from suicidal ideations. Patient now just need detox from alcohol. No withdrawal symptoms so far. He is set up for outpatient detox.   Richardean Canal, MD 11/04/11 [phone]

## 2011-11-04 NOTE — [facility] Counselor (Signed)
Patient discharged to follow up with residential treatment at [facility] Wednesday 11/09/2011 @ 8am. Patient agreed with plan for treatment. Patient given phone number and address to the facility.

## 2011-12-04 ENCOUNTER — Encounter (HOSPITAL_COMMUNITY): Payer: Self-pay | Admitting: *Deleted

## 2011-12-04 ENCOUNTER — Emergency Department (HOSPITAL_COMMUNITY)
Admission: EM | Admit: 2011-12-04 | Discharge: 2011-12-04 | Disposition: A | Discharge status: Home / Self Care | Payer: Self-pay | Attending: Emergency Medicine | Admitting: Emergency Medicine

## 2011-12-04 DIAGNOSIS — F1010 Alcohol abuse, uncomplicated: Secondary | ICD-10-CM

## 2011-12-04 DIAGNOSIS — F10929 Alcohol use, unspecified with intoxication, unspecified: Secondary | ICD-10-CM

## 2011-12-04 DIAGNOSIS — F17200 Nicotine dependence, unspecified, uncomplicated: Secondary | ICD-10-CM | POA: Insufficient documentation

## 2011-12-04 DIAGNOSIS — K2970 Gastritis, unspecified, without bleeding: Secondary | ICD-10-CM

## 2011-12-04 DIAGNOSIS — I10 Essential (primary) hypertension: Secondary | ICD-10-CM | POA: Insufficient documentation

## 2011-12-04 LAB — ETHANOL: Alcohol, Ethyl (B): 111 mg/dL — ABNORMAL HIGH (ref 0–11)

## 2011-12-04 LAB — CBC
MCH: 33.2 pg (ref 26.0–34.0)
MCHC: 35.3 g/dL (ref 30.0–36.0)
Platelets: 191 10*3/uL (ref 150–400)
RBC: 4.64 MIL/uL (ref 4.22–5.81)
RDW: 13.3 % (ref 11.5–15.5)

## 2011-12-04 LAB — COMPREHENSIVE METABOLIC PANEL
ALT: 18 U/L (ref 0–53)
AST: 21 U/L (ref 0–37)
Albumin: 3.7 g/dL (ref 3.5–5.2)
CO2: 27 mEq/L (ref 19–32)
Calcium: 9.1 mg/dL (ref 8.4–10.5)
Sodium: 140 mEq/L (ref 135–145)
Total Protein: 7.2 g/dL (ref 6.0–8.3)

## 2011-12-04 MED ORDER — FAMOTIDINE 20 MG PO TABS
20.0000 mg | ORAL_TABLET | Freq: Once | ORAL | Status: AC
  Administered 2011-12-04: 20 mg via ORAL
  Filled 2011-12-04: qty 1

## 2011-12-04 MED ORDER — PANTOPRAZOLE SODIUM 40 MG PO TBEC: 40.0000 mg | DELAYED_RELEASE_TABLET | Freq: Every day | ORAL | Status: DC

## 2011-12-04 MED ORDER — ONDANSETRON 8 MG PO TBDP
8.0000 mg | ORAL_TABLET | Freq: Once | ORAL | Status: AC
  Administered 2011-12-04: 8 mg via ORAL
  Filled 2011-12-04: qty 1

## 2011-12-04 NOTE — Discharge Instructions (Signed)
Your lab work appears unremarkable, except for your alcohol level which is high - no driving for the next 4 hours, or any time when drinking alcohol. For stomach pain, avoid alcohol, take protonix (acid blocker medication), you may also try pepcid and maalox for symptom relief. Avoid alcohol use - follow up with AA and use resource guide provided for additional community resources. For depression issues/mental health follow up - follow up with [facility] in the next couple days and see resource guide provided.  Also follow up with primary care doctor in coming week.       Alcohol Intoxication You have alcohol intoxication when the amount of alcohol that you have consumed has impaired your ability to mentally and physically function. There are a variety of factors that contribute to the level at which alcohol intoxication can occur, such as age, gender, weight, frequency of alcohol consumption, medication use, and the presence of other medical conditions, such as diabetes, seizures, or heart conditions. The blood alcohol level test measures the concentration of alcohol in your blood. In most states, your blood alcohol level must be lower than 80 mg/dL (9.14%) to legally drive. However, many dangerous effects of alcohol can occur at much lower levels. Alcohol directly impairs the normal chemical activity of the brain and is said to be a chemical depressant. Alcohol can cause drowsiness, stupor, respiratory failure, and coma. Other physical effects can include headache, vomiting, vomiting of blood, abdominal pain, a fast heartbeat, difficulty breathing, anxiety, and amnesia. Alcohol intoxication can also lead to dangerous and life-threatening activities, such as fighting, dangerous operation of vehicles or heavy machinery, and risky sexual behavior. Alcohol can be especially dangerous when taken with other drugs. Some of these drugs  are:  Sedatives.  Painkillers.  Marijuana.  Tranquilizers.  Antihistamines.  Muscle relaxants.  Seizure medicine. Many of the effects of acute alcohol intoxication are temporary. However, repeated alcohol intoxication can lead to severe medical illnesses. If you have alcohol intoxication, you should:  Stay hydrated. Drink enough water and fluids to keep your urine clear or pale yellow. Avoid excessive caffeine because this can further lead to dehydration.  Eat a healthy diet. You may have residual nausea, headache, and loss of appetite, but it is still important that you maintain good nutrition. You can start with clear liquids.  Take nonsteroidal anti-inflammatory medications as needed for headaches, but make sure to do so with small meals. You should avoid acetaminophen for several days after having alcohol intoxication because the combination of alcohol and acetaminophen can be toxic to your liver. If you have frequent alcohol intoxication, ask your friends and family if they think you have a drinking problem. For further help, contact:  Your caregiver.  Alcoholics Anonymous (AA).  A drug or alcohol rehabilitation program. SEEK MEDICAL CARE IF:   You have persistent vomiting.  You have persistent pain in any part of your body.  You do not feel better after a few days. SEEK IMMEDIATE MEDICAL CARE IF:   You become shaky or tremble when you try to stop drinking.  You shake uncontrollably (seizure).  You throw up (vomit) blood. This may be bright red or it may look like black coffee grounds.  You have blood in the stool. This may be bright red or appear as a black, tarry, bad smelling stool.  You become lightheaded or faint. ANY OF THESE SYMPTOMS MAY REPRESENT A SERIOUS PROBLEM THAT IS AN EMERGENCY. Do not wait to see if the symptoms will go away.  Get medical help right away. Call your local emergency services (911 in U.[place] DO NOT drive yourself to the hospital. MAKE  SURE YOU:   Understand these instructions.  Will watch your condition.  Will get help right away if you are not doing well or get worse. Document Released: 10/27/2004 Document Revised: 04/11/2011 Document Reviewed: 07/06/2009 [facility] Patient Information 2013 [place], [place].    Alcohol Problems Most adults who drink alcohol drink in moderation (not a lot) are at low risk for developing problems related to their drinking. However, all drinkers, including low-risk drinkers, should know about the health risks connected with drinking alcohol. RECOMMENDATIONS FOR LOW-RISK DRINKING  Drink in moderation. Moderate drinking is defined as follows:   Men - no more than 2 drinks per day.  Nonpregnant women - no more than 1 drink per day.  Over age 54 - no more than 1 drink per day. A standard drink is 12 grams of pure alcohol, which is equal to a 12 ounce bottle of beer or wine cooler, a 5 ounce glass of wine, or 1.5 ounces of distilled spirits (such as whiskey, brandy, vodka, or rum).  ABSTAIN FROM (DO NOT DRINK) ALCOHOL:  When pregnant or considering pregnancy.  When taking a medication that interacts with alcohol.  If you are alcohol dependent.  A medical condition that prohibits drinking alcohol (such as ulcer, liver disease, or heart disease). DISCUSS WITH YOUR CAREGIVER:  If you are at risk for coronary heart disease, discuss the potential benefits and risks of alcohol use: Light to moderate drinking is associated with lower rates of coronary heart disease in certain populations (for example, men over age 40 and postmenopausal women). Infrequent or nondrinkers are advised not to begin light to moderate drinking to reduce the risk of coronary heart disease so as to avoid creating an alcohol-related problem. Similar protective effects can likely be gained through proper diet and exercise.  Women and the elderly have smaller amounts of body water than men. As a result women and the  elderly achieve a higher blood alcohol concentration after drinking the same amount of alcohol.  Exposing a fetus to alcohol can cause a broad range of birth defects referred to as Fetal Alcohol Syndrome (FAS) or Alcohol-Related Birth Defects (ARBD). Although FAS/ARBD is connected with excessive alcohol consumption during pregnancy, studies also have reported neurobehavioral problems in infants born to mothers reporting drinking an average of 1 drink per day during pregnancy.  Heavier drinking (the consumption of more than 4 drinks per occasion by men and more than 3 drinks per occasion by women) impairs learning (cognitive) and psychomotor functions and increases the risk of alcohol-related problems, including accidents and injuries. CAGE QUESTIONS:   Have you ever felt that you should Cut down on your drinking?  Have people Annoyed you by criticizing your drinking?  Have you ever felt bad or Guilty about your drinking?  Have you ever had a drink first thing in the morning to steady your nerves or get rid of a hangover (Eye opener)? If you answered positively to any of these questions: You may be at risk for alcohol-related problems if alcohol consumption is:   Men: Greater than 14 drinks per week or more than 4 drinks per occasion.  Women: Greater than 7 drinks per week or more than 3 drinks per occasion. Do you or your family have a medical history of alcohol-related problems, such as:  Blackouts.  Sexual dysfunction.  Depression.  Trauma.  Liver dysfunction.  Sleep  disorders.  Hypertension.  Chronic abdominal pain.  Has your drinking ever caused you problems, such as problems with your family, problems with your work (or school) performance, or accidents/injuries?  Do you have a compulsion to drink or a preoccupation with drinking?  Do you have poor control or are you unable to stop drinking once you have started?  Do you have to drink to avoid withdrawal  symptoms?  Do you have problems with withdrawal such as tremors, nausea, sweats, or mood disturbances?  Does it take more alcohol than in the past to get you high?  Do you feel a strong urge to drink?  Do you change your plans so that you can have a drink?  Do you ever drink in the morning to relieve the shakes or a hangover? If you have answered a number of the previous questions positively, it may be time for you to talk to your caregivers, family, and friends and see if they think you have a problem. Alcoholism is a chemical dependency that keeps getting worse and will eventually destroy your health and relationships. Many alcoholics end up dead, impoverished, or in prison. This is often the end result of all chemical dependency.  Do not be discouraged if you are not ready to take action immediately.  Decisions to change behavior often involve up and down desires to change and feeling like you cannot decide.  Try to think more seriously about your drinking behavior.  Think of the reasons to quit. WHERE TO GO FOR ADDITIONAL INFORMATION   The National Institute on Alcohol Abuse and Alcoholism (NIAAA)www.niaaa.nih.gov  [employer] on Alcoholism and Drug Dependence (NCADD)www.ncadd.org  American Society of Addiction Medicine (ASAM)www.https://anderson-johnson.com/ Document Released: 01/17/2005 Document Revised: 04/11/2011 Document Reviewed: 09/05/2007 [facility] Patient Information 2013 [place], [place].    Gastritis, Adult Gastritis is soreness and swelling (inflammation) of the lining of the stomach. Gastritis can develop as a sudden onset (acute) or long-term (chronic) condition. If gastritis is not treated, it can lead to stomach bleeding and ulcers. CAUSES  Gastritis occurs when the stomach lining is weak or damaged. Digestive juices from the stomach then inflame the weakened stomach lining. The stomach lining may be weak or damaged due to viral or bacterial infections. One common bacterial  infection is the Helicobacter pylori infection. Gastritis can also result from excessive alcohol consumption, taking certain medicines, or having too much acid in the stomach.  SYMPTOMS  In some cases, there are no symptoms. When symptoms are present, they may include:  Pain or a burning sensation in the upper abdomen.  Nausea.  Vomiting.  An uncomfortable feeling of fullness after eating. DIAGNOSIS  Your caregiver may suspect you have gastritis based on your symptoms and a physical exam. To determine the cause of your gastritis, your caregiver may perform the following:  Blood or stool tests to check for the H pylori bacterium.  Gastroscopy. A thin, flexible tube (endoscope) is passed down the esophagus and into the stomach. The endoscope has a light and camera on the end. Your caregiver uses the endoscope to view the inside of the stomach.  Taking a tissue sample (biopsy) from the stomach to examine under a microscope. TREATMENT  Depending on the cause of your gastritis, medicines may be prescribed. If you have a bacterial infection, such as an H pylori infection, antibiotics may be given. If your gastritis is caused by too much acid in the stomach, H2 blockers or antacids may be given. Your caregiver may recommend that you  stop taking aspirin, ibuprofen, or other nonsteroidal anti-inflammatory drugs (NSAIDs). HOME CARE INSTRUCTIONS  Only take over-the-counter or prescription medicines as directed by your caregiver.  If you were given antibiotic medicines, take them as directed. Finish them even if you start to feel better.  Drink enough fluids to keep your urine clear or pale yellow.  Avoid foods and drinks that make your symptoms worse, such as:  Caffeine or alcoholic drinks.  Chocolate.  Peppermint or mint flavorings.  Garlic and onions.  Spicy foods.  Citrus fruits, such as oranges, lemons, or limes.  Tomato-based foods such as sauce, chili, salsa, and pizza.  Fried  and fatty foods.  Eat small, frequent meals instead of large meals. SEEK IMMEDIATE MEDICAL CARE IF:   You have black or dark red stools.  You vomit blood or material that looks like coffee grounds.  You are unable to keep fluids down.  Your abdominal pain gets worse.  You have a fever.  You do not feel better after 1 week.  You have any other questions or concerns. MAKE SURE YOU:  Understand these instructions.  Will watch your condition.  Will get help right away if you are not doing well or get worse. Document Released: 01/11/2001 Document Revised: 07/19/2011 Document Reviewed: 03/02/2011 [employer] Patient Information 2013 [place], [place].     Depression, Adult Depression refers to feeling sad, low, down in the dumps, blue, gloomy, or empty. In general, there are two kinds of depression: 1. Depression that we all experience from time to time because of upsetting life experiences, including the loss of a job or the ending of a relationship (normal sadness or normal grief). This kind of depression is considered normal, is short lived, and resolves within a few days to 2 weeks. (Depression experienced after the loss of a loved one is called bereavement. Bereavement often lasts longer than 2 weeks but normally gets better with time.) 2. Clinical depression, which lasts longer than normal sadness or normal grief or interferes with your ability to function at home, at work, and in school. It also interferes with your personal relationships. It affects almost every aspect of your life. Clinical depression is an illness. Symptoms of depression also can be caused by conditions other than normal sadness and grief or clinical depression. Examples of these conditions are listed as follows:  Physical illness Some physical illnesses, including underactive thyroid gland (hypothyroidism), severe anemia, specific types of cancer, diabetes, uncontrolled seizures, heart and lung problems, strokes,  and chronic pain are commonly associated with symptoms of depression.  Side effects of some prescription medicine In some people, certain types of prescription medicine can cause symptoms of depression.  Substance abuse Abuse of alcohol and illicit drugs can cause symptoms of depression. SYMPTOMS Symptoms of normal sadness and normal grief include the following:  Feeling sad or crying for short periods of time.  Not caring about anything (apathy).  Difficulty sleeping or sleeping too much.  No longer able to enjoy the things you used to enjoy.  Desire to be by oneself all the time (social isolation).  Lack of energy or motivation.  Difficulty concentrating or remembering.  Change in appetite or weight.  Restlessness or agitation. Symptoms of clinical depression include the same symptoms of normal sadness or normal grief and also the following symptoms:  Feeling sad or crying all the time.  Feelings of guilt or worthlessness.  Feelings of hopelessness or helplessness.  Thoughts of suicide or the desire to harm yourself (suicidal ideation).  Loss of touch with reality (psychotic symptoms). Seeing or hearing things that are not real (hallucinations) or having false beliefs about your life or the people around you (delusions and paranoia). DIAGNOSIS  The diagnosis of clinical depression usually is based on the severity and duration of the symptoms. Your caregiver also will ask you questions about your medical history and substance use to find out if physical illness, use of prescription medicine, or substance abuse is causing your depression. Your caregiver also may order blood tests. TREATMENT  Typically, normal sadness and normal grief do not require treatment. However, sometimes antidepressant medicine is prescribed for bereavement to ease the depressive symptoms until they resolve. The treatment for clinical depression depends on the severity of your symptoms but typically  includes antidepressant medicine, counseling with a mental health professional, or a combination of both. Your caregiver will help to determine what treatment is best for you. Depression caused by physical illness usually goes away with appropriate medical treatment of the illness. If prescription medicine is causing depression, talk with your caregiver about stopping the medicine, decreasing the dose, or substituting another medicine. Depression caused by abuse of alcohol or illicit drugs abuse goes away with abstinence from these substances. Some adults need professional help in order to stop drinking or using drugs. SEEK IMMEDIATE CARE IF:  You have thoughts about hurting yourself or others.  You lose touch with reality (have psychotic symptoms).  You are taking medicine for depression and have a serious side effect. FOR MORE INFORMATION National Alliance on Mental Illness: www.nami.[employer] of Mental Health: http://www.maynard.net/ Document Released: 01/15/2000 Document Revised: 07/19/2011 Document Reviewed: 04/18/2011 [facility] Patient Information 2013 [place], [place].       RESOURCE GUIDE  Chronic Pain Problems: Contact [facility]  [phone] Patients need to be referred by their primary care doctor.  Insufficient Money for Medicine: Contact United Way:  call "211" or [facility] 305-187-4124.  No Primary Care Doctor: - Call [facility]  [phone] - can help you locate a primary care doctor that  accepts your insurance, provides certain services, etc. - Physician Referral [phone]  Agencies that provide inexpensive medical care: - [facility]  [phone] - [facility]  [phone] - [facility]  [phone] [facility]  [phone] - [facility]  [phone] [facility]  [phone]  Medicaid-accepting [facility] Providers: - [facility]- [address]  [phone], Mon-Fri 9am-7pm, Sat 9am-1pm - [facility]- [address] [place], [address] [place]  [phone] - [facility] [employer] [facility] - [facility]- [address], [address] [place]  [phone] [facility]- [address]  [phone] - Renaye Rakers- [address] [place], [address], [phone]  Only accepts [place] patients after they have their name  applied to their card  Self Pay (no insurance) in [place]: - Sickle Cell Patients: Dr Willey Blade, [facility]  [address] [place], [phone] - [facility]- [address] [place]  [phone]       [facility] [place]- [address]       -     [facility]- see information above (Speak to [employer] if you do not have insurance)       -  [facility]- [address]  [phone]       [facility]- [address]  [phone]       -  [facility]- [address] [phone]       -  Dr Julio Sicks-  [address], [place], [phone]       -  [facility]- [address] [phone]       -  [facility]- [address], [phone], also [address], [phone]       -    [facility]- [address] [place], [phone], 1st & 3rd Saturday   every month, 10am-1pm  1) Find a Doctor and Pay Out of Pocket Although you won't have to find out who is covered by your insurance plan, it is a good idea to ask around and get recommendations. You will then need to call the office and see if the doctor you have chosen will accept you as a new patient and what types of options they offer for patients who are self-pay. Some doctors offer discounts or will set up payment plans for their patients who do not have insurance, but you will need to ask so you aren't surprised when you get to your appointment.  2) Contact Your Local Health  Department Not all health departments have doctors that can see patients for sick visits, but many do, so it is worth a call to see if yours does. If you don't know where your local health department is, you can check in your phone book. The CDC also has a tool to help you locate your state's health department, and many state websites also have listings of all of their local health departments.  3) Find a Walk-in Clinic If your illness is not likely to be very severe or complicated, you may want to try a walk in clinic. These are popping up all over the country in pharmacies, drugstores, and shopping centers. They're usually staffed by nurse practitioners or physician assistants that have been trained to treat common illnesses and complaints. They're usually fairly quick and inexpensive. However, if you have serious medical issues or chronic medical problems, these are probably not your best option  STD Testing - [facility] Department of [facility], STD Clinic, [address], [place], phone [phone] or [phone].  Monday - Friday, call for an appointment. [facility] Department of [employer], STD Clinic, [place] [address], [place], phone [phone] or [phone].  Monday - Friday, call for an appointment.  Abuse/Neglect: [facility] Child Abuse Hotline 9093924244 [facility] Child Abuse Hotline 347-624-2484 (After Hours)  Emergency Shelter:  [facility] ([phone]  Maternity Homes: - [facility] 203-867-4327 - [facility] [phone]  MRSA Hotline #:   256-239-8657  [facility] Resources  [facility] [place]  United Way [facility] Dept. [address]                 [address]         [address] [place] [address]  [address]  [facility] Phone:  [phone]                                   Phone:  [phone]                   Phone:  [phone]  [facility], [phone] - [facility] - [phone]       -     [facility] in [place], [address],                                  [phone], [facility] Child Abuse Hotline 425 660 7883 or [phone] (After Hours)   Behavioral Health Services  Substance Abuse Resources: - Alcohol and Drug Services  (367)[phone] - [facility] [phone] - [facility] [place] [phone] Floydene Flock ([phone] - Residential & Outpatient Substance Abuse Program  7175553572  Psychological Services: [facility]  [phone] [facility] Services  [phone] - [facility], [phone] [place]. [address], [place], ACCESS LINE: [phone] or ([phone], [employer]urLoan.co.za  Dental Assistance  If unable to pay or uninsured, contact:  [facility] or [facility]. to become qualified for the adult dental clinic.  Patients with Medicaid: [facility] 313-691-0123 [address] Joellyn Quails, [phone] [address], [phone]  If unable to pay, or uninsured, contact [facility] [phone]) or [facility] Department [phone] in [place], [phone] in [facility]) to become qualified for the adult dental clinic  Other Low-Cost Community Dental Services: - [facility]- [address] [place], [place], [place], [address], [phone], [address]. [address], 2nd and 4th Thursday of the month at 6:30am.  10 clients each day by appointment, can sometimes see walk-in patients if someone does not show for an appointment. [facility]- [address] [place], [place], [address], [phone] - [facility]- [address] [place], [place], [address], [phone] - [facility]  Department- [phone] [facility] Health Department- [phone] [facility] Department- [phone]

## 2011-12-04 NOTE — ED Notes (Signed)
Pt unable to void at this time. Advised that we need a specimen and he was given a urinal. He states he will call when he has urine.

## 2011-12-04 NOTE — Progress Notes (Signed)
Upon assessment of pt, he requests "paper scrubs." Advised pt that those scrubs were reserved for our behavioral pts and not generally given to pts unless they have no clothes or extenuating circumstances. Advised pt that our gowns are for medical pts. He then stated that he wants help with alcohol and is having suicidal thoughts. I asked if he told the ED MD, he said, "no, I didn't think about it." When asked if he had a plan, he stated yes, but did not want to tell us. Continued to ask pt about his situation and he reports that he has been having "trouble with ex-girlfriend" which is what has prompted these SI. He would not elaborate on the trouble. He states he has never felt like this before, and states he tried to attempt suicide last week with a chainsaw while he is gesturing to his R hand. I asked him to show me what he had done, and he shows me a very well-healed scar on his R hand. I asked, "you did this last week?" He states, "no, I did this last winter trying to cut my thumb off." Clarified with pt about inconsistency in history given by him and he states, "yes I have tried before." Concerning pt's drinking. He reports drinking 9-11, 40 oz beers per day. His last drink was last night at 11 pm.

## 2011-12-04 NOTE — ED Provider Notes (Signed)
History     CSN: [id number]  Arrival date & time 12/04/11  4098   First MD Initiated Contact with Patient 12/04/11 0700      Chief Complaint  Patient presents with  . Abdominal Pain  . Alcohol Intoxication    (Consider location/radiation/quality/duration/timing/severity/associated sxs/prior treatment) Patient is a 46 y.o. male presenting with abdominal pain and intoxication. The history is provided by the patient.  Abdominal Pain The primary symptoms of the illness include abdominal pain. The primary symptoms of the illness do not include fever, shortness of breath, vomiting or diarrhea.  Symptoms associated with the illness do not include back pain.  Alcohol Intoxication Associated symptoms include abdominal pain. Pertinent negatives include no chest pain, no headaches and no shortness of breath.  [facility] c/o abdominal pain. C/o epigastric pain. Pt w hx etoh abuse, states drinks several beers per day. Denies hx complicated alcohol withdrawal, seizures, or dts. Epigastric pain constant, dull, moderate, non radiating, no specific exacerbating or alleviating factors. Denies hx pud, gallstones , or pancreatitis.  No fever or chills. Nausea. No vomiting. Having normal bms. No diarrhea or constipation. Only prior abd surgery is remote hx umbilical hernia repair, no pain or swelling to area. Denies abdominal trauma or fall. No cp or sob. Hx depression, states feels depressed at times, no acute or abrupt change in these symptoms. Denies SI/HI.   Past Medical History  Diagnosis Date  . Hypertension   . Alcoholism /alcohol abuse     Past Surgical History  Procedure Date  . Hernia repair     History reviewed. No pertinent family history.  History  Substance Use Topics  . Smoking status: Current Every Day Smoker -- 1.0 packs/day for 30 years    Types: Cigarettes  . Smokeless tobacco: Not on file  . Alcohol Use: Yes     Comment: Drinks every day, 9-11 40 oz  beers.      Review of Systems  Constitutional: Negative for fever.  HENT: Negative for neck pain.   Eyes: Negative for pain.  Respiratory: Negative for cough and shortness of breath.   Cardiovascular: Negative for chest pain.  Gastrointestinal: Positive for abdominal pain. Negative for vomiting and diarrhea.  Genitourinary: Negative for flank pain.  Musculoskeletal: Negative for back pain.  Skin: Negative for rash.  Neurological: Negative for headaches.  Hematological: Does not bruise/bleed easily.  Psychiatric/Behavioral: Negative for confusion.    Allergies  Bee venom and Coffee bean extract  Home Medications  No current outpatient prescriptions on file.  There were no vitals taken for this visit.  Physical Exam  Nursing note and vitals reviewed. Constitutional: He is oriented to person, place, and time. He appears well-developed and well-nourished. No distress.  HENT:  Head: Atraumatic.  Mouth/Throat: Oropharynx is clear and moist.  Eyes: Pupils are equal, round, and reactive to light. No scleral icterus.  Neck: Neck supple. No tracheal deviation present.  Cardiovascular: Normal rate, regular rhythm, normal heart sounds and intact distal pulses.   Pulmonary/Chest: Effort normal and breath sounds normal. No accessory muscle usage. No respiratory distress.  Abdominal: Soft. Bowel sounds are normal. He exhibits no distension and no mass. There is tenderness. There is no rebound and no guarding.       Epigastric tenderness. No rebound or guarding. No hernias.   Genitourinary:       No cva tenderness  Musculoskeletal: Normal range of motion. He exhibits no edema and no tenderness.  Neurological: He is alert  and oriented to person, place, and time.  Skin: Skin is warm and dry.  Psychiatric: He has a normal mood and affect.    ED Course  Procedures (including critical care time)   Labs Reviewed  CBC  COMPREHENSIVE METABOLIC PANEL  LIPASE, BLOOD  ETHANOL  URINE  RAPID DRUG SCREEN (HOSP PERFORMED)    Results for orders placed during the hospital encounter of 12/04/11  CBC      Component Value Range   WBC 5.2  4.0 - 10.5 K/uL   RBC 4.64  4.22 - 5.81 MIL/uL   Hemoglobin 15.4  13.0 - 17.0 g/dL   HCT 16.1  09.6 - 04.5 %   MCV 94.0  78.0 - 100.0 fL   MCH 33.2  26.0 - 34.0 pg   MCHC 35.3  30.0 - 36.0 g/dL   RDW 40.9  81.1 - 91.4 %   Platelets 191  150 - 400 K/uL  COMPREHENSIVE METABOLIC PANEL      Component Value Range   Sodium 140  135 - 145 mEq/L   Potassium 4.4  3.5 - 5.1 mEq/L   Chloride 104  96 - 112 mEq/L   CO2 27  19 - 32 mEq/L   Glucose, Bld 85  70 - 99 mg/dL   BUN 7  6 - 23 mg/dL   Creatinine, Ser 7.82  0.50 - 1.35 mg/dL   Calcium 9.1  8.4 - 95.6 mg/dL   Total Protein 7.2  6.0 - 8.3 g/dL   Albumin 3.7  3.5 - 5.2 g/dL   AST 21  0 - 37 U/L   ALT 18  0 - 53 U/L   Alkaline Phosphatase 64  39 - 117 U/L   Total Bilirubin 0.2 (*) 0.3 - 1.2 mg/dL   GFR calc non Af Amer >90  >90 mL/min   GFR calc Af Amer >90  >90 mL/min  LIPASE, BLOOD      Component Value Range   Lipase 34  11 - 59 U/L  ETHANOL      Component Value Range   Alcohol, Ethyl (B) 111 (*) 0 - 11 mg/dL     MDM  Labs.  pepcid po, zofran po.  Reviewed nursing notes and prior charts for additional history.  Multiple ed visits related to issues with etoh abuse and depression.  Pt w recent referral to rehab/detox program which he was not compliant with.   Recheck abd soft nt. No nv.   Pt encourage to stop drinking, giving resource guide for both substance abuse and mental health follow up. Also encourage close pcp follow up.            Suzi Roots, MD 12/04/11 [phone]

## 2011-12-04 NOTE — ED Notes (Signed)
Patient is alert and oriented x3. [facility] on [address] Found patient sitting on ground.  He was complaining of abdominal pain

## 2011-12-11 ENCOUNTER — Encounter (HOSPITAL_COMMUNITY): Payer: Self-pay

## 2011-12-11 ENCOUNTER — Emergency Department (HOSPITAL_COMMUNITY)
Admission: EM | Admit: 2011-12-11 | Discharge: 2011-12-12 | Disposition: A | Discharge status: Home / Self Care | Payer: No Typology Code available for payment source | Attending: Emergency Medicine | Admitting: Emergency Medicine

## 2011-12-11 DIAGNOSIS — R45851 Suicidal ideations: Secondary | ICD-10-CM | POA: Insufficient documentation

## 2011-12-11 DIAGNOSIS — I10 Essential (primary) hypertension: Secondary | ICD-10-CM | POA: Insufficient documentation

## 2011-12-11 DIAGNOSIS — F17200 Nicotine dependence, unspecified, uncomplicated: Secondary | ICD-10-CM | POA: Insufficient documentation

## 2011-12-11 DIAGNOSIS — Z008 Encounter for other general examination: Secondary | ICD-10-CM | POA: Insufficient documentation

## 2011-12-11 DIAGNOSIS — R109 Unspecified abdominal pain: Secondary | ICD-10-CM | POA: Insufficient documentation

## 2011-12-11 DIAGNOSIS — F1010 Alcohol abuse, uncomplicated: Secondary | ICD-10-CM | POA: Insufficient documentation

## 2011-12-11 LAB — CBC
HCT: 43.7 % (ref 39.0–52.0)
Hemoglobin: 15.7 g/dL (ref 13.0–17.0)
MCV: 93.6 fL (ref 78.0–100.0)
RBC: 4.67 MIL/uL (ref 4.22–5.81)
WBC: 6.2 10*3/uL (ref 4.0–10.5)

## 2011-12-11 LAB — RAPID URINE DRUG SCREEN, HOSP PERFORMED
Amphetamines: NOT DETECTED
Opiates: NOT DETECTED
Tetrahydrocannabinol: NOT DETECTED

## 2011-12-11 LAB — COMPREHENSIVE METABOLIC PANEL
Alkaline Phosphatase: 66 U/L (ref 39–117)
BUN: 9 mg/dL (ref 6–23)
CO2: 22 mEq/L (ref 19–32)
Chloride: 101 mEq/L (ref 96–112)
Creatinine, Ser: 0.75 mg/dL (ref 0.50–1.35)
GFR calc non Af Amer: >90 mL/min (ref >90)
Potassium: 3.5 mEq/L (ref 3.5–5.1)
Total Bilirubin: 0.4 mg/dL (ref 0.3–1.2)

## 2011-12-11 LAB — LIPASE, BLOOD: Lipase: 46 U/L (ref 11–59)

## 2011-12-11 LAB — ETHANOL: Alcohol, Ethyl (B): 221 mg/dL — ABNORMAL HIGH (ref 0–11)

## 2011-12-11 MED ORDER — NICOTINE 21 MG/24HR TD PT24
21.0000 mg | MEDICATED_PATCH | Freq: Every day | TRANSDERMAL | Status: DC
  Filled 2011-12-11: qty 1

## 2011-12-11 MED ORDER — FOLIC ACID 1 MG PO TABS
1.0000 mg | ORAL_TABLET | Freq: Every day | ORAL | Status: DC
  Administered 2011-12-11: 1 mg via ORAL
  Filled 2011-12-11: qty 1

## 2011-12-11 MED ORDER — PANTOPRAZOLE SODIUM 40 MG PO TBEC
40.0000 mg | DELAYED_RELEASE_TABLET | Freq: Every day | ORAL | Status: DC
  Administered 2011-12-11: 40 mg via ORAL
  Filled 2011-12-11: qty 1

## 2011-12-11 MED ORDER — ALUM & MAG HYDROXIDE-SIMETH 200-200-20 MG/5ML PO SUSP: 30.0000 mL | ORAL | Status: DC | PRN

## 2011-12-11 MED ORDER — VITAMIN B-1 100 MG PO TABS
100.0000 mg | ORAL_TABLET | Freq: Every day | ORAL | Status: DC
  Administered 2011-12-11: 100 mg via ORAL
  Filled 2011-12-11: qty 1

## 2011-12-11 MED ORDER — LORAZEPAM 1 MG PO TABS: 1.0000 mg | ORAL_TABLET | Freq: Four times a day (QID) | ORAL | Status: DC | PRN

## 2011-12-11 MED ORDER — THIAMINE HCL 100 MG/ML IJ SOLN: 100.0000 mg | Freq: Every day | INTRAMUSCULAR | Status: DC

## 2011-12-11 MED ORDER — ZOLPIDEM TARTRATE 5 MG PO TABS: 5.0000 mg | ORAL_TABLET | Freq: Every evening | ORAL | Status: DC | PRN

## 2011-12-11 MED ORDER — ADULT MULTIVITAMIN W/MINERALS CH
1.0000 | ORAL_TABLET | Freq: Every day | ORAL | Status: DC
  Administered 2011-12-11: 1 via ORAL
  Filled 2011-12-11: qty 1

## 2011-12-11 MED ORDER — LORAZEPAM 2 MG/ML IJ SOLN: 1.0000 mg | Freq: Four times a day (QID) | INTRAMUSCULAR | Status: DC | PRN

## 2011-12-11 NOTE — ED Notes (Signed)
MD at bedside. 

## 2011-12-11 NOTE — ED Provider Notes (Signed)
History     CSN: [id number]  Arrival date & time 12/11/11  [name]   First MD Initiated Contact with Patient 12/11/11 2140      Chief Complaint  Patient presents with  . Medical Clearance  . Alcohol Intoxication  . Suicidal  . Abdominal Pain    (Consider location/radiation/quality/duration/timing/severity/associated sxs/prior treatment) HPI Patient requesting detox from alcohol. He also reports that he is feeling highly suicidal. He admits to drinking alcohol earlier today. Denies pain anywhere presently. Past Medical History  Diagnosis Date  . Hypertension   . Alcoholism /alcohol abuse     Past Surgical History  Procedure Date  . Hernia repair     No family history on file.  History  Substance Use Topics  . Smoking status: Current Every Day Smoker -- 1.0 packs/day for 30 years    Types: Cigarettes  . Smokeless tobacco: Not on file  . Alcohol Use: Yes     Comment: Drinks every day, 9-11 40 oz beers.      Review of Systems  Constitutional: Negative.   HENT: Negative.   Respiratory: Negative.   Cardiovascular: Negative.   Gastrointestinal: Negative.   Musculoskeletal: Negative.   Skin: Negative.   Neurological: Negative.   Hematological: Negative.   Psychiatric/Behavioral: Positive for dysphoric mood.    Allergies  Bee venom and Coffee bean extract  Home Medications   Current Outpatient Rx  Name  Route  Sig  Dispense  Refill  . PANTOPRAZOLE SODIUM 40 MG PO TBEC   Oral   Take 1 tablet (40 mg total) by mouth daily.   20 tablet   0     BP 110/75  Pulse 87  Temp 98.7 F (37.1 C) (Oral)  SpO2 100%  Physical Exam  Nursing note and vitals reviewed. Constitutional: He is oriented to person, place, and time. He appears well-developed and well-nourished. He appears distressed.       Anxious appearing odor of alcohol on breath  HENT:  Head: Normocephalic and atraumatic.  Eyes: Conjunctivae normal are normal. Pupils are equal, round, and reactive to  light.  Neck: Neck supple. No tracheal deviation present. No thyromegaly present.  Cardiovascular: Normal rate and regular rhythm.   No murmur heard. Pulmonary/Chest: Effort normal and breath sounds normal.  Abdominal: Soft. Bowel sounds are normal. He exhibits no distension. There is no tenderness.  Musculoskeletal: Normal range of motion. He exhibits no edema and no tenderness.  Neurological: He is alert and oriented to person, place, and time. Coordination normal.       Gait slightly unsteady  Skin: Skin is warm and dry. No rash noted.  Psychiatric: He has a normal mood and affect.    ED Course  Procedures (including critical care time)  Labs Reviewed  ETHANOL - Abnormal; Notable for the following:    Alcohol, Ethyl (B) 221 (*)     All other components within normal limits  CBC  COMPREHENSIVE METABOLIC PANEL  URINE RAPID DRUG SCREEN (HOSP PERFORMED)  LIPASE, BLOOD   No results found.   No diagnosis found.  Results for orders placed during the hospital encounter of 12/11/11  CBC      Component Value Range   WBC 6.2  4.0 - 10.5 K/uL   RBC 4.67  4.22 - 5.81 MIL/uL   Hemoglobin 15.7  13.0 - 17.0 g/dL   HCT 08.6  57.8 - 46.9 %   MCV 93.6  78.0 - 100.0 fL   MCH 33.6  26.0 - 34.0 pg  MCHC 35.9  30.0 - 36.0 g/dL   RDW 16.1  09.6 - 04.5 %   Platelets 186  150 - 400 K/uL  COMPREHENSIVE METABOLIC PANEL      Component Value Range   Sodium 136  135 - 145 mEq/L   Potassium 3.5  3.5 - 5.1 mEq/L   Chloride 101  96 - 112 mEq/L   CO2 22  19 - 32 mEq/L   Glucose, Bld 84  70 - 99 mg/dL   BUN 9  6 - 23 mg/dL   Creatinine, Ser 4.09  0.50 - 1.35 mg/dL   Calcium 9.4  8.4 - 81.1 mg/dL   Total Protein 7.6  6.0 - 8.3 g/dL   Albumin 4.0  3.5 - 5.2 g/dL   AST 30  0 - 37 U/L   ALT 21  0 - 53 U/L   Alkaline Phosphatase 66  39 - 117 U/L   Total Bilirubin 0.4  0.3 - 1.2 mg/dL   GFR calc non Af Amer >90  >90 mL/min   GFR calc Af Amer >90  >90 mL/min  ETHANOL      Component Value Range     Alcohol, Ethyl (B) 221 (*) 0 - 11 mg/dL  URINE RAPID DRUG SCREEN (HOSP PERFORMED)      Component Value Range   Opiates NONE DETECTED  NONE DETECTED   Cocaine NONE DETECTED  NONE DETECTED   Benzodiazepines NONE DETECTED  NONE DETECTED   Amphetamines NONE DETECTED  NONE DETECTED   Tetrahydrocannabinol NONE DETECTED  NONE DETECTED   Barbiturates NONE DETECTED  NONE DETECTED  LIPASE, BLOOD      Component Value Range   Lipase 46  11 - 59 U/L   No results found.   MDM  Patient to be evaluated by psychiatry and by ACT team for placement Diagnosis #1 suicidal ideation #2 alcohol abuse        Doug Sou, MD 12/12/11 9147

## 2011-12-11 NOTE — ED Notes (Signed)
When collecting labs, pt kept talking about how he was going to find something to kill himself with tonight.  Whether he is here or in the streets.  Triage RN aware and Psych RN aware.

## 2011-12-11 NOTE — ED Notes (Signed)
PTAR reports pt here for detox from ETOH- Pt is intoxicated- 9 (40's) beer today.  Admits to SI and stomach pain

## 2011-12-12 NOTE — ED Notes (Addendum)
Pt undergoing telepsych consult.

## 2011-12-12 NOTE — BHH Counselor (Signed)
Telepsych consult recommends discharge. Pt able to contract for safety. Pt encouraged to followup with [facility]. He refused to take list of outpatient resources offered.

## 2011-12-12 NOTE — ED Provider Notes (Signed)
Telepsych consult 12-12-11 Dr Jacky Kindle recs OK for discharge home with Outpatient follow up community Mental Health with ACT referral to alcohol and drug programs. No SI.  [name], MD 12/12/11 [phone]
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
## 2011-12-25 ENCOUNTER — Emergency Department (HOSPITAL_COMMUNITY)
Admission: EM | Admit: 2011-12-25 | Discharge: 2011-12-25 | Disposition: A | Discharge status: Home / Self Care | Payer: No Typology Code available for payment source | Attending: Emergency Medicine | Admitting: Emergency Medicine

## 2011-12-25 ENCOUNTER — Emergency Department (HOSPITAL_COMMUNITY): Payer: No Typology Code available for payment source

## 2011-12-25 ENCOUNTER — Encounter (HOSPITAL_COMMUNITY): Payer: Self-pay | Admitting: Emergency Medicine

## 2011-12-25 DIAGNOSIS — F10929 Alcohol use, unspecified with intoxication, unspecified: Secondary | ICD-10-CM

## 2011-12-25 DIAGNOSIS — F1010 Alcohol abuse, uncomplicated: Secondary | ICD-10-CM | POA: Insufficient documentation

## 2011-12-25 DIAGNOSIS — I10 Essential (primary) hypertension: Secondary | ICD-10-CM | POA: Insufficient documentation

## 2011-12-25 DIAGNOSIS — F17200 Nicotine dependence, unspecified, uncomplicated: Secondary | ICD-10-CM | POA: Insufficient documentation

## 2011-12-25 LAB — CBC WITH DIFFERENTIAL/PLATELET
Basophils Relative: 0 % (ref 0–1)
Eosinophils Absolute: 0.3 10*3/uL (ref 0.0–0.7)
Eosinophils Relative: 6 % — ABNORMAL HIGH (ref 0–5)
Hemoglobin: 14.5 g/dL (ref 13.0–17.0)
MCH: 33.2 pg (ref 26.0–34.0)
MCHC: 35.1 g/dL (ref 30.0–36.0)
MCV: 94.5 fL (ref 78.0–100.0)
Monocytes Absolute: 0.4 10*3/uL (ref 0.1–1.0)
Monocytes Relative: 9 % (ref 3–12)
Neutrophils Relative %: 41 % — ABNORMAL LOW (ref 43–77)

## 2011-12-25 LAB — COMPREHENSIVE METABOLIC PANEL
Albumin: 3.6 g/dL (ref 3.5–5.2)
BUN: 10 mg/dL (ref 6–23)
Calcium: 8.8 mg/dL (ref 8.4–10.5)
Creatinine, Ser: 0.73 mg/dL (ref 0.50–1.35)
GFR calc Af Amer: >90 mL/min (ref >90)
Glucose, Bld: 92 mg/dL (ref 70–99)
Potassium: 3.6 mEq/L (ref 3.5–5.1)
Total Protein: 6.8 g/dL (ref 6.0–8.3)

## 2011-12-25 LAB — RAPID URINE DRUG SCREEN, HOSP PERFORMED
Cocaine: NOT DETECTED
Opiates: NOT DETECTED
Tetrahydrocannabinol: NOT DETECTED

## 2011-12-25 LAB — ETHANOL: Alcohol, Ethyl (B): 226 mg/dL — ABNORMAL HIGH (ref 0–11)

## 2011-12-25 MED ORDER — ONDANSETRON HCL 4 MG/2ML IJ SOLN
4.0000 mg | Freq: Once | INTRAMUSCULAR | Status: AC
  Administered 2011-12-25: 4 mg via INTRAVENOUS
  Filled 2011-12-25: qty 2

## 2011-12-25 NOTE — ED Notes (Signed)
Water offered to pt.
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
## 2011-12-25 NOTE — ED Notes (Addendum)
Pt. In new blue scrubs.pt. Searched and wanded by security. Pt. Belongings searched and wanded by security.pt. Has 1 belongings bag. Pt. Has pant, belt, underpants, socks, tennis shoes,shirt, t-shirt, cell phone and bandana rag.pt. Belongings locked up in nurses station in the yellow zone.

## 2011-12-25 NOTE — ED Notes (Signed)
Pt did not drink any fluids, states he still feels nauseated. Has not vomited

## 2011-12-25 NOTE — ED Notes (Signed)
Pt. called EMS with the complaint of alcohol intoxication with  nausea , vomiting and claimed that he had 12 of 40oz of beer since 9:15 am of yesterday. Pt. Is alert and oriented x 4, denied SOB. Pt. Was found at [address], near [place] on [address].

## 2012-08-27 ENCOUNTER — Encounter (HOSPITAL_COMMUNITY): Payer: Self-pay | Admitting: Emergency Medicine

## 2012-08-27 ENCOUNTER — Emergency Department (HOSPITAL_COMMUNITY)
Admission: EM | Admit: 2012-08-27 | Discharge: 2012-08-27 | Disposition: A | Discharge status: Home / Self Care | Payer: Self-pay | Attending: Emergency Medicine | Admitting: Emergency Medicine

## 2012-08-27 DIAGNOSIS — M545 Low back pain, unspecified: Secondary | ICD-10-CM | POA: Insufficient documentation

## 2012-08-27 DIAGNOSIS — R52 Pain, unspecified: Secondary | ICD-10-CM | POA: Insufficient documentation

## 2012-08-27 DIAGNOSIS — F17200 Nicotine dependence, unspecified, uncomplicated: Secondary | ICD-10-CM | POA: Insufficient documentation

## 2012-08-27 DIAGNOSIS — Z87828 Personal history of other (healed) physical injury and trauma: Secondary | ICD-10-CM | POA: Insufficient documentation

## 2012-08-27 DIAGNOSIS — Z8659 Personal history of other mental and behavioral disorders: Secondary | ICD-10-CM | POA: Insufficient documentation

## 2012-08-27 DIAGNOSIS — I10 Essential (primary) hypertension: Secondary | ICD-10-CM | POA: Insufficient documentation

## 2012-08-27 DIAGNOSIS — G8929 Other chronic pain: Secondary | ICD-10-CM | POA: Insufficient documentation

## 2012-08-27 HISTORY — DX: Anxiety disorder, unspecified: F41.9

## 2012-08-27 HISTORY — DX: Depression, unspecified: F32.A

## 2012-08-27 HISTORY — DX: Major depressive disorder, single episode, unspecified: F32.9

## 2012-08-27 MED ORDER — HYDROCODONE-ACETAMINOPHEN 5-325 MG PO TABS: 1.0000 | ORAL_TABLET | ORAL | Status: DC | PRN

## 2012-08-27 NOTE — ED Notes (Signed)
Woke Sat am with severe lower back pain. Has had back issues on and off in the past.

## 2012-08-27 NOTE — ED Provider Notes (Signed)
  CSN: [id number]     Arrival date & time 08/27/12  0806 History     First MD Initiated Contact with Patient 08/27/12 [phone]     Chief Complaint  Patient presents with  . Back Pain   (Consider location/radiation/quality/duration/timing/severity/associated sxs/prior Treatment) The history is provided by the patient and medical records.   Patient presents to the ED for low back pain. States he woke up Saturday morning and had a feeling of something in his back was "off".  No recent injury or trauma.  Patient states he has flares of back pain on and off for the past several years following an old football injury. Denies any numbness or paresthesias of lower extremities. No loss of bowel or bladder function. Has not taken any medications for his symptoms.  Pt states "i don't think i can go to work today, i may not even be able to go tomorrow" and requests note for work.  Past Medical History  Diagnosis Date  . Hypertension   . Alcoholism /alcohol abuse   . Anxiety and depression    Past Surgical History  Procedure Laterality Date  . Hernia repair     History reviewed. No pertinent family history. History  Substance Use Topics  . Smoking status: Current Every Day Smoker -- 1.00 packs/day for 30 years    Types: Cigarettes  . Smokeless tobacco: Not on file  . Alcohol Use: No     Comment: Drinks every day, 9-11 40 oz beers. Pt denies ETOH use at the time.    Review of Systems  Musculoskeletal: Positive for back pain.  All other systems reviewed and are negative.    Allergies  Bee venom and Coffee bean extract  Home Medications  No current outpatient prescriptions on file. Pulse 43  Temp(Src) 98.1 F (36.7 C) (Oral)  Resp 20  SpO2 98%  Physical Exam  Nursing note and vitals reviewed. Constitutional: He is oriented to person, place, and time. He appears well-developed and well-nourished.  HENT:  Head: Normocephalic and atraumatic.  Mouth/Throat: Oropharynx is clear and  moist.  Eyes: Conjunctivae and EOM are normal. Pupils are equal, round, and reactive to light.  Neck: Normal range of motion. Neck supple.  Cardiovascular: Normal rate, regular rhythm and normal heart sounds.   Pulmonary/Chest: Effort normal and breath sounds normal.  Musculoskeletal: Normal range of motion.       Lumbar back: He exhibits tenderness and pain. He exhibits normal range of motion, no bony tenderness, no swelling, no edema, no deformity, no laceration, no spasm and normal pulse.       Back:  TTP of lumbar paraspinal areas bilaterally, full ROM maintained, distal sensation intact, normal gait  Neurological: He is alert and oriented to person, place, and time.  Skin: Skin is warm and dry.  Psychiatric: He has a normal mood and affect.    ED Course   Procedures (including critical care time)  Labs Reviewed - No data to display No results found.  1. Low back pain     MDM   Acute on chronic back pain. No new injury-- imaging deferred.  No concern for cauda equina.  Rx vicodin.  FU with [facility] if problems occur.  Discussed plan with pt, he agreed.  Return precautions advised.  Garlon Hatchet, PA-C 08/27/12 1549  Garlon Hatchet, PA-C 08/27/12 1550

## 2012-08-27 NOTE — ED Notes (Signed)
Pt states he is supposed to be on meds for anxiety and depression but he stopped them. "I really need to get back on them". Pt has no PCP.

## 2012-08-27 NOTE — ED Provider Notes (Signed)
Medical screening examination/treatment/procedure(s) were performed by non-physician practitioner and as supervising physician I was immediately available for consultation/collaboration.   Paxton Kanaan, MD 08/27/12 2157 

## 2013-01-15 ENCOUNTER — Encounter (HOSPITAL_COMMUNITY): Payer: Self-pay | Admitting: Emergency Medicine

## 2013-01-15 ENCOUNTER — Emergency Department (HOSPITAL_COMMUNITY): Payer: Self-pay

## 2013-01-15 ENCOUNTER — Other Ambulatory Visit: Payer: Self-pay

## 2013-01-15 ENCOUNTER — Inpatient Hospital Stay (HOSPITAL_COMMUNITY)
Admission: EM | Admit: 2013-01-15 | Discharge: 2013-01-18 | DRG: 897 | Disposition: A | Discharge status: Home / Self Care | Payer: Self-pay | Attending: Internal Medicine | Admitting: Internal Medicine

## 2013-01-15 DIAGNOSIS — R079 Chest pain, unspecified: Secondary | ICD-10-CM

## 2013-01-15 DIAGNOSIS — F3289 Other specified depressive episodes: Secondary | ICD-10-CM | POA: Diagnosis present

## 2013-01-15 DIAGNOSIS — Z6826 Body mass index (BMI) 26.0-26.9, adult: Secondary | ICD-10-CM

## 2013-01-15 DIAGNOSIS — F341 Dysthymic disorder: Secondary | ICD-10-CM

## 2013-01-15 DIAGNOSIS — R5381 Other malaise: Secondary | ICD-10-CM | POA: Diagnosis present

## 2013-01-15 DIAGNOSIS — Z5900 Homelessness unspecified: Secondary | ICD-10-CM

## 2013-01-15 DIAGNOSIS — F10239 Alcohol dependence with withdrawal, unspecified: Principal | ICD-10-CM

## 2013-01-15 DIAGNOSIS — F10929 Alcohol use, unspecified with intoxication, unspecified: Secondary | ICD-10-CM | POA: Insufficient documentation

## 2013-01-15 DIAGNOSIS — IMO0001 Reserved for inherently not codable concepts without codable children: Secondary | ICD-10-CM | POA: Diagnosis present

## 2013-01-15 DIAGNOSIS — R110 Nausea: Secondary | ICD-10-CM | POA: Diagnosis present

## 2013-01-15 DIAGNOSIS — R404 Transient alteration of awareness: Secondary | ICD-10-CM | POA: Diagnosis present

## 2013-01-15 DIAGNOSIS — R000 Tachycardia, unspecified: Secondary | ICD-10-CM | POA: Diagnosis present

## 2013-01-15 DIAGNOSIS — I4892 Unspecified atrial flutter: Secondary | ICD-10-CM | POA: Diagnosis present

## 2013-01-15 DIAGNOSIS — I10 Essential (primary) hypertension: Secondary | ICD-10-CM | POA: Diagnosis present

## 2013-01-15 DIAGNOSIS — R109 Unspecified abdominal pain: Secondary | ICD-10-CM | POA: Diagnosis present

## 2013-01-15 DIAGNOSIS — F1020 Alcohol dependence, uncomplicated: Secondary | ICD-10-CM | POA: Diagnosis present

## 2013-01-15 DIAGNOSIS — Z8249 Family history of ischemic heart disease and other diseases of the circulatory system: Secondary | ICD-10-CM

## 2013-01-15 DIAGNOSIS — F1010 Alcohol abuse, uncomplicated: Secondary | ICD-10-CM

## 2013-01-15 DIAGNOSIS — F10939 Alcohol use, unspecified with withdrawal, unspecified: Principal | ICD-10-CM | POA: Diagnosis present

## 2013-01-15 DIAGNOSIS — F411 Generalized anxiety disorder: Secondary | ICD-10-CM | POA: Diagnosis present

## 2013-01-15 DIAGNOSIS — R42 Dizziness and giddiness: Secondary | ICD-10-CM | POA: Diagnosis present

## 2013-01-15 DIAGNOSIS — F329 Major depressive disorder, single episode, unspecified: Secondary | ICD-10-CM | POA: Insufficient documentation

## 2013-01-15 DIAGNOSIS — F17200 Nicotine dependence, unspecified, uncomplicated: Secondary | ICD-10-CM | POA: Diagnosis present

## 2013-01-15 DIAGNOSIS — R269 Unspecified abnormalities of gait and mobility: Secondary | ICD-10-CM | POA: Diagnosis present

## 2013-01-15 DIAGNOSIS — Z720 Tobacco use: Secondary | ICD-10-CM | POA: Diagnosis present

## 2013-01-15 DIAGNOSIS — R0789 Other chest pain: Secondary | ICD-10-CM | POA: Diagnosis present

## 2013-01-15 DIAGNOSIS — Y92410 Unspecified street and highway as the place of occurrence of the external cause: Secondary | ICD-10-CM

## 2013-01-15 HISTORY — DX: Tobacco use: Z72.0

## 2013-01-15 LAB — RAPID URINE DRUG SCREEN, HOSP PERFORMED
Amphetamines: NOT DETECTED
Barbiturates: NOT DETECTED
Benzodiazepines: NOT DETECTED
Cocaine: NOT DETECTED
Tetrahydrocannabinol: NOT DETECTED

## 2013-01-15 LAB — BASIC METABOLIC PANEL
CO2: 23 mEq/L (ref 19–32)
Calcium: 9.6 mg/dL (ref 8.4–10.5)
Creatinine, Ser: 0.93 mg/dL (ref 0.50–1.35)
Glucose, Bld: 140 mg/dL — ABNORMAL HIGH (ref 70–99)
Potassium: 4.2 mEq/L (ref 3.5–5.1)
Sodium: 132 mEq/L — ABNORMAL LOW (ref 135–145)

## 2013-01-15 LAB — CBC WITH DIFFERENTIAL/PLATELET
Basophils Absolute: 0 10*3/uL (ref 0.0–0.1)
Basophils Relative: 0 % (ref 0–1)
Lymphocytes Relative: 34 % (ref 12–46)
MCHC: 34.9 g/dL (ref 30.0–36.0)
Monocytes Relative: 12 % (ref 3–12)
Neutro Abs: 2.8 10*3/uL (ref 1.7–7.7)
Neutrophils Relative %: 51 % (ref 43–77)
Platelets: 184 10*3/uL (ref 150–400)
RDW: 12.4 % (ref 11.5–15.5)
WBC: 5.5 10*3/uL (ref 4.0–10.5)

## 2013-01-15 LAB — HEPATIC FUNCTION PANEL
Bilirubin, Direct: 0.1 mg/dL (ref 0.0–0.3)
Indirect Bilirubin: 0.5 mg/dL (ref 0.3–0.9)
Total Bilirubin: 0.6 mg/dL (ref 0.3–1.2)
Total Protein: 6.6 g/dL (ref 6.0–8.3)

## 2013-01-15 LAB — COMPREHENSIVE METABOLIC PANEL
AST: 43 U/L — ABNORMAL HIGH (ref 0–37)
BUN: 11 mg/dL (ref 6–23)
CO2: 28 mEq/L (ref 19–32)
Calcium: 8.8 mg/dL (ref 8.4–10.5)
Creatinine, Ser: 0.91 mg/dL (ref 0.50–1.35)
GFR calc Af Amer: >90 mL/min (ref >90)
GFR calc non Af Amer: >90 mL/min (ref >90)
Glucose, Bld: 132 mg/dL — ABNORMAL HIGH (ref 70–99)
Total Protein: 6.4 g/dL (ref 6.0–8.3)

## 2013-01-15 LAB — MAGNESIUM: Magnesium: 2.1 mg/dL (ref 1.5–2.5)

## 2013-01-15 LAB — CBC
HCT: 42.7 % (ref 39.0–52.0)
Hemoglobin: 15 g/dL (ref 13.0–17.0)
MCH: 32.6 pg (ref 26.0–34.0)
MCV: 92.8 fL (ref 78.0–100.0)
Platelets: 211 10*3/uL (ref 150–400)
RBC: 4.6 MIL/uL (ref 4.22–5.81)

## 2013-01-15 LAB — PROTIME-INR: INR: 1.08 (ref 0.00–1.49)

## 2013-01-15 LAB — PHOSPHORUS: Phosphorus: 2.9 mg/dL (ref 2.3–4.6)

## 2013-01-15 LAB — APTT: aPTT: 35 seconds (ref 24–37)

## 2013-01-15 LAB — POCT I-STAT TROPONIN I

## 2013-01-15 MED ORDER — ACETAMINOPHEN 325 MG PO TABS: 650.0000 mg | ORAL_TABLET | Freq: Four times a day (QID) | ORAL | Status: DC | PRN

## 2013-01-15 MED ORDER — VITAMIN B-1 100 MG PO TABS
100.0000 mg | ORAL_TABLET | Freq: Every day | ORAL | Status: DC
  Administered 2013-01-16 – 2013-01-17 (×2): 100 mg via ORAL
  Filled 2013-01-15 (×4): qty 1

## 2013-01-15 MED ORDER — LORAZEPAM 2 MG/ML IJ SOLN
1.0000 mg | Freq: Four times a day (QID) | INTRAMUSCULAR | Status: DC | PRN
  Administered 2013-01-15 – 2013-01-17 (×2): 1 mg via INTRAVENOUS
  Filled 2013-01-15 (×3): qty 1

## 2013-01-15 MED ORDER — DILTIAZEM LOAD VIA INFUSION
20.0000 mg | Freq: Once | INTRAVENOUS | Status: AC
  Administered 2013-01-15: 20 mg via INTRAVENOUS
  Filled 2013-01-15: qty 20

## 2013-01-15 MED ORDER — SODIUM CHLORIDE 0.9 % IJ SOLN: 3.0000 mL | Freq: Two times a day (BID) | INTRAMUSCULAR | Status: DC

## 2013-01-15 MED ORDER — ACETAMINOPHEN 650 MG RE SUPP: 650.0000 mg | Freq: Four times a day (QID) | RECTAL | Status: DC | PRN

## 2013-01-15 MED ORDER — DILTIAZEM HCL 100 MG IV SOLR
15.0000 mg/h | INTRAVENOUS | Status: DC
  Filled 2013-01-15: qty 100

## 2013-01-15 MED ORDER — LORAZEPAM 1 MG PO TABS
1.0000 mg | ORAL_TABLET | Freq: Four times a day (QID) | ORAL | Status: DC | PRN
  Administered 2013-01-16: 09:00:00 1 mg via ORAL
  Filled 2013-01-15: qty 1

## 2013-01-15 MED ORDER — ASPIRIN 81 MG PO CHEW
81.0000 mg | CHEWABLE_TABLET | Freq: Every day | ORAL | Status: DC
  Administered 2013-01-15 – 2013-01-17 (×3): 81 mg via ORAL
  Filled 2013-01-15 (×4): qty 1

## 2013-01-15 MED ORDER — FOLIC ACID 1 MG PO TABS
1.0000 mg | ORAL_TABLET | Freq: Every day | ORAL | Status: DC
  Administered 2013-01-15 – 2013-01-17 (×3): 1 mg via ORAL
  Filled 2013-01-15 (×4): qty 1

## 2013-01-15 MED ORDER — METOPROLOL TARTRATE 1 MG/ML IV SOLN
5.0000 mg | Freq: Once | INTRAVENOUS | Status: AC
  Administered 2013-01-15: 5 mg via INTRAVENOUS
  Filled 2013-01-15: qty 5

## 2013-01-15 MED ORDER — THIAMINE HCL 100 MG/ML IJ SOLN: 100.0000 mg | Freq: Every day | INTRAMUSCULAR | Status: DC

## 2013-01-15 MED ORDER — THIAMINE HCL 100 MG/ML IJ SOLN
100.0000 mg | Freq: Every day | INTRAMUSCULAR | Status: DC
  Filled 2013-01-15 (×2): qty 1

## 2013-01-15 MED ORDER — ONDANSETRON HCL 4 MG/2ML IJ SOLN: 4.0000 mg | Freq: Four times a day (QID) | INTRAMUSCULAR | Status: DC | PRN

## 2013-01-15 MED ORDER — DILTIAZEM HCL 100 MG IV SOLR
15.0000 mg/h | Freq: Once | INTRAVENOUS | Status: AC
  Administered 2013-01-15: 15 mg/h via INTRAVENOUS

## 2013-01-15 MED ORDER — SODIUM CHLORIDE 0.9 % IV SOLN
INTRAVENOUS | Status: DC
  Administered 2013-01-17: 08:00:00 via INTRAVENOUS

## 2013-01-15 MED ORDER — METOPROLOL TARTRATE 25 MG PO TABS
25.0000 mg | ORAL_TABLET | Freq: Two times a day (BID) | ORAL | Status: DC
  Administered 2013-01-16 – 2013-01-17 (×2): 25 mg via ORAL
  Filled 2013-01-15 (×7): qty 1

## 2013-01-15 MED ORDER — ADENOSINE 6 MG/2ML IV SOLN
INTRAVENOUS | Status: AC
  Administered 2013-01-15: 6 mg
  Filled 2013-01-15: qty 4

## 2013-01-15 MED ORDER — SODIUM CHLORIDE 0.9 % IV SOLN
INTRAVENOUS | Status: DC
  Administered 2013-01-15: 75 mL/h via INTRAVENOUS

## 2013-01-15 MED ORDER — LORAZEPAM 2 MG/ML IJ SOLN
1.0000 mg | Freq: Once | INTRAMUSCULAR | Status: DC
  Filled 2013-01-15: qty 1

## 2013-01-15 MED ORDER — DILTIAZEM HCL 25 MG/5ML IV SOLN
10.0000 mg | Freq: Once | INTRAVENOUS | Status: DC
  Filled 2013-01-15: qty 5

## 2013-01-15 MED ORDER — SODIUM CHLORIDE 0.9 % IJ SOLN
3.0000 mL | Freq: Two times a day (BID) | INTRAMUSCULAR | Status: DC
  Administered 2013-01-16 – 2013-01-17 (×2): 3 mL via INTRAVENOUS

## 2013-01-15 MED ORDER — LORAZEPAM 2 MG/ML IJ SOLN
0.0000 mg | Freq: Four times a day (QID) | INTRAMUSCULAR | Status: AC
  Administered 2013-01-15 – 2013-01-16 (×3): 2 mg via INTRAVENOUS
  Administered 2013-01-16: 12:00:00 1 mg via INTRAVENOUS
  Filled 2013-01-15 (×4): qty 1

## 2013-01-15 MED ORDER — ONDANSETRON HCL 4 MG PO TABS: 4.0000 mg | ORAL_TABLET | Freq: Four times a day (QID) | ORAL | Status: DC | PRN

## 2013-01-15 MED ORDER — METOPROLOL TARTRATE 1 MG/ML IV SOLN: 2.5000 mg | Freq: Once | INTRAVENOUS | Status: DC

## 2013-01-15 MED ORDER — MAGNESIUM SULFATE IN D5W 10-5 MG/ML-% IV SOLN
1.0000 g | Freq: Once | INTRAVENOUS | Status: AC
  Administered 2013-01-15: 1 g via INTRAVENOUS
  Filled 2013-01-15: qty 100

## 2013-01-15 MED ORDER — DILTIAZEM HCL 100 MG IV SOLR
10.0000 mg/h | Freq: Once | INTRAVENOUS | Status: AC
  Administered 2013-01-15: 10 mg/h via INTRAVENOUS
  Filled 2013-01-15: qty 100

## 2013-01-15 MED ORDER — ADULT MULTIVITAMIN W/MINERALS CH
1.0000 | ORAL_TABLET | Freq: Every day | ORAL | Status: DC
  Administered 2013-01-15 – 2013-01-17 (×3): 1 via ORAL
  Filled 2013-01-15 (×4): qty 1

## 2013-01-15 MED ORDER — DILTIAZEM HCL 25 MG/5ML IV SOLN
20.0000 mg | Freq: Once | INTRAVENOUS | Status: DC
  Filled 2013-01-15: qty 5

## 2013-01-15 MED ORDER — HYDROCODONE-ACETAMINOPHEN 5-325 MG PO TABS: 1.0000 | ORAL_TABLET | ORAL | Status: DC | PRN

## 2013-01-15 MED ORDER — SODIUM CHLORIDE 0.9 % IV BOLUS (SEPSIS)
1000.0000 mL | Freq: Once | INTRAVENOUS | Status: AC
  Administered 2013-01-15: 1000 mL via INTRAVENOUS

## 2013-01-15 MED ORDER — LORAZEPAM 2 MG/ML IJ SOLN
1.0000 mg | Freq: Once | INTRAMUSCULAR | Status: AC
  Administered 2013-01-15: 1 mg via INTRAVENOUS

## 2013-01-15 MED ORDER — MORPHINE SULFATE 2 MG/ML IJ SOLN
1.0000 mg | INTRAMUSCULAR | Status: DC | PRN
  Administered 2013-01-17: 1 mg via INTRAVENOUS
  Filled 2013-01-15: qty 1

## 2013-01-15 MED ORDER — VITAMIN B-1 100 MG PO TABS
100.0000 mg | ORAL_TABLET | Freq: Every day | ORAL | Status: DC
  Administered 2013-01-15: 100 mg via ORAL
  Filled 2013-01-15: qty 1

## 2013-01-15 MED ORDER — DILTIAZEM HCL 25 MG/5ML IV SOLN: 20.0000 mg | Freq: Once | INTRAVENOUS | Status: DC

## 2013-01-15 MED ORDER — LORAZEPAM 2 MG/ML IJ SOLN
0.0000 mg | Freq: Two times a day (BID) | INTRAMUSCULAR | Status: DC
  Administered 2013-01-17 – 2013-01-18 (×2): 2 mg via INTRAVENOUS
  Filled 2013-01-15 (×2): qty 1

## 2013-01-15 NOTE — ED Notes (Signed)
Patient transported to X-ray 

## 2013-01-15 NOTE — ED Notes (Addendum)
MD at bedside. 1445 Cardiology at [facility] aware of current vitals. Pt stable. Cardizem stopped

## 2013-01-15 NOTE — Progress Notes (Signed)
Pt rhythm going in and out of A-fib/NSR/Bigemminy though heart rate is between 45 and 100 at all times, generally around 60-70's.  MD notified.  No new orders.  Will continue to monitor pt.
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
## 2013-01-15 NOTE — Progress Notes (Signed)
[facility] provided pt with a list of primary care resources, [facility] [employer] application and [facility] information.

## 2013-01-15 NOTE — ED Notes (Signed)
Bed: WA08 Expected date:  Expected time:  Means of arrival:  Comments: 
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
## 2013-01-15 NOTE — Progress Notes (Signed)
    CARE MANAGEMENT ED NOTE 01/15/2013  Patient:  [name],[name]   Account Number:  [id number]  Date Initiated:  01/15/2013  Documentation initiated by:  Edd Arbour  Subjective/Objective Assessment:   47 yr old [place]/self pay pt with a [address] [place] [address] address He reports he is homeless Confirms no pcp Pt seen by [employer] staff member     Subjective/Objective Assessment Detail:   CM consult for Pt is homeless. Has trouble affording medications. Is interested in going to a [facility] upon discharge. Please provide available resources. Also interested in [facility] on [address] in [place]  Confirmed with Cm he is a present [facility] client &has been assisted with shelter from [facility] has been to [facility] Verifies he understands the rules for [facility] house Pt states he wants to go to [facility] also seen by [facility] staff member, Kennyth Arnold See her note     Action/Plan:   Cm spoke with pt to provide resources, review [facility] MATCH programs and to provide him with a pharmacy medication discount card plus the 211 community resources card Placed all items in his light brown/beige bad in a hospital belongings bag   Action/Plan Detail:   Reviewed MATCH -CM reviewed EPIC notes & chart review information CM spoke with the pt about [facility] MATCH program ($3 co pay for each Rx through [facility] program, does not include refills,7 day expiration of MATCH letter and choice of pharmacies   Anticipated DC Date:  01/18/2013     Status Recommendation to Physician:   Result of Recommendation:    Other ED Services  Consult Working Plan    DC Planning Services  Other  PCP issues  GCCN / P4HM (established/new)  Outpatient Services - Pt will follow up  Medication Assistance    Choice offered to / List presented to:            Status of service:  Completed, signed off  ED Comments:  MATCH assistance pending confirmation of d/c  medication list  ED Comments Detail:  CM discussed and provided written information for self pay pcps, importance of pcp for f/u care, www.needymeds.org, discounted pharmacies and other [employer] such as financial assistance, DSS and  health department Reviewed resources for [employer] self pay pcps like [facility], family medicine at [address], [facility] family practice, general medical clinics, [facility] urgent care plus others, [facility] out patient pharmacies and housing Pt voiced understanding and appreciation of resources provided
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
## 2013-01-15 NOTE — Progress Notes (Addendum)
NURSING PROGRESS NOTE  [name] [id number] Admitted to 4E: 01/15/2013 Attending Provider: Alison Murray, MD    [name] is a 47 y.o. male patient admitted from ED awake, alert  & orientated  X 4,  Full Code, VSS - Blood pressure 106/48, pulse 81, temperature 98.4 F (36.9 C), temperature source Oral, resp. rate 18, height 5\' 7"  (1.702 m), weight 76.3 kg (168 lb 3.4 oz), SpO2 99.00%., O2 2 L nasal cannular, no c/o shortness of breath, no c/o chest pain, no distress noted. Tele # 4 placed and pt is currently running:NSR.   IV site WDL: Rt AC=NSL, Rt FA= NS @75  with a transparent dsg that's clean dry and intact.  Allergies:   Allergies  Allergen Reactions  . Bee Venom Anaphylaxis    Uses epi pen-- last used 08/23/11  . Coffee Bean Extract [Coffea Arabica] Anaphylaxis     Past Medical History  Diagnosis Date  . Hypertension   . Alcoholism /alcohol abuse   . Anxiety and depression   . Tobacco abuse     History:  obtained from patient.  Pt orientation to unit, room and routine. Information packet given to patient/family and safety video watched.  Admission INP armband ID verified with patient/family, and in place. SR up x 2, fall risk assessment complete with Patient and family verbalizing understanding of risks associated with falls. Pt verbalizes an understanding of how to use the call bell and to call for help before getting out of bed.  Skin, clean-dry- intact without evidence of bruising, or skin tears.   No evidence of skin break down noted on exam.    Will cont to monitor and assist as needed.  Ubaldo Glassing, RN 01/15/2013

## 2013-01-15 NOTE — Progress Notes (Signed)
Utilization Review completed.  Jazzlynn Rawe RN CM  

## 2013-01-15 NOTE — ED Notes (Signed)
Pt reports he has not drank in 4 months attempting to get visitation with child. Pt at present is pain free.

## 2013-01-15 NOTE — ED Notes (Signed)
MG ++ infusing Ativan 1mg  given before transfer Irving Burton RN aware of pending meds CIWA 6.  Pt stable
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
## 2013-01-16 DIAGNOSIS — R079 Chest pain, unspecified: Secondary | ICD-10-CM

## 2013-01-16 DIAGNOSIS — I4892 Unspecified atrial flutter: Secondary | ICD-10-CM

## 2013-01-16 DIAGNOSIS — F1020 Alcohol dependence, uncomplicated: Secondary | ICD-10-CM

## 2013-01-16 LAB — TSH: TSH: 1.112 u[IU]/mL (ref 0.350–4.500)

## 2013-01-16 LAB — TROPONIN I: Troponin I: <0.3 ng/mL (ref <0.30)

## 2013-01-16 LAB — PROTIME-INR: INR: 1.05 (ref 0.00–1.49)

## 2013-01-16 LAB — COMPREHENSIVE METABOLIC PANEL
ALT: 36 U/L (ref 0–53)
AST: 40 U/L — ABNORMAL HIGH (ref 0–37)
Albumin: 3 g/dL — ABNORMAL LOW (ref 3.5–5.2)
BUN: 12 mg/dL (ref 6–23)
Calcium: 8.8 mg/dL (ref 8.4–10.5)
Chloride: 103 mEq/L (ref 96–112)
Creatinine, Ser: 0.78 mg/dL (ref 0.50–1.35)
GFR calc non Af Amer: >90 mL/min (ref >90)
Glucose, Bld: 103 mg/dL — ABNORMAL HIGH (ref 70–99)
Potassium: 3.9 mEq/L (ref 3.5–5.1)
Total Bilirubin: 0.4 mg/dL (ref 0.3–1.2)

## 2013-01-16 LAB — CBC
Hemoglobin: 11.9 g/dL — ABNORMAL LOW (ref 13.0–17.0)
MCH: 31.2 pg (ref 26.0–34.0)
MCV: 95.3 fL (ref 78.0–100.0)
Platelets: 144 10*3/uL — ABNORMAL LOW (ref 150–400)
RBC: 3.81 MIL/uL — ABNORMAL LOW (ref 4.22–5.81)
RDW: 12.7 % (ref 11.5–15.5)
WBC: 4.7 10*3/uL (ref 4.0–10.5)

## 2013-01-16 LAB — APTT: aPTT: 32 seconds (ref 24–37)

## 2013-01-16 LAB — MRSA PCR SCREENING: MRSA by PCR: NEGATIVE

## 2013-01-16 MED ORDER — DILTIAZEM HCL 25 MG/5ML IV SOLN
10.0000 mg | Freq: Once | INTRAVENOUS | Status: AC
  Administered 2013-01-16: 10:00:00 10 mg via INTRAVENOUS
  Filled 2013-01-16: qty 5

## 2013-01-16 MED ORDER — DILTIAZEM HCL 100 MG IV SOLR
5.0000 mg/h | INTRAVENOUS | Status: DC
  Administered 2013-01-16 (×2): 5 mg/h via INTRAVENOUS
  Filled 2013-01-16: qty 100
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
## 2013-01-16 NOTE — Progress Notes (Signed)
[facility]  2D Echocardiogram completed.  Jalani Cullifer L Kristiane Morsch, RDCS 01/16/2013 9:15 AM

## 2013-01-16 NOTE — Clinical Social Work Psychosocial (Signed)
     Clinical Social Work Department BRIEF PSYCHOSOCIAL ASSESSMENT 01/16/2013  Patient:  [name], [name]     Account Number:  [id number]     Admit date:  01/15/2013  Clinical Social Worker:  Hattie Perch  Date/Time:  01/16/2013 12:00 M  Referred by:  Physician  Date Referred:  01/16/2013 Referred for  Substance Abuse   Other Referral:   Interview type:  Patient Other interview type:    PSYCHOSOCIAL DATA Living Status:  ALONE Admitted from facility:   Level of care:   Primary support name:  Everett Graff Primary support relationship to patient:  FAMILY Degree of support available:   poo    CURRENT CONCERNS Current Concerns  Substance Abuse   Other Concerns:    SOCIAL WORK ASSESSMENT / PLAN CSW met with patient. CSW consulted for multiple concerns including ETOH abuse and homelessness. patient is alert and oriented X3. patient states that he was living in someones car prior to coming in. States that he drank 9 40's a day for the past five days. patient states that he doesnt want substance abuse treatment. states that he will go stay with his uncle upon discharge. he states that all he would need is a bus pass. uncle lives here in [place]. CSW asked about how he previously discussed shelter in [employer]. He states that is an option but he will stay with his uncle he thinks.   Assessment/plan status:   Other assessment/ plan:   Information/referral to community resources:    PATIENTS/FAMILYS RESPONSE TO PLAN OF CARE: patient is anxious and started getting aggravated while talking with Child psychotherapist. other than a bus pass, patient denies any other CSW needs. Nurse states that patient begins to get agitated right before his ativan is due. Patient may transfer to step down unit due to uncontrolled blood pressure.

## 2013-01-16 NOTE — Evaluation (Addendum)
Physical Therapy Evaluation Patient Details Name: [name] MRN: [id number] DOB: Jun 27, 1965 Today's Date: 01/16/2013 Time: 2952-8413 PT Time Calculation (min): 24 min  PT Assessment / Plan / Recommendation History of Present Illness  47 yo male admitted with tachycardia, A flutter with RVR, dizziness. Pt is homeless.   Clinical Impression  On eval, pt required Mod assist for safe mobility-walked ~35 feet with walker. Numerous "near falls" during session however pt did not allow himself to really fall/hit ground. Pt's actions are very unpredictable. Pt did c/o headache and dizziness during session. At this time, based off pt's performance, recommend SNF (unless mobility and safety improve greatly). Noted in chart that pt is homeless. Pt stated to therapist that he is planning to d/c "to my uncle's house."    PT Assessment  Patient needs continued PT services    Follow Up Recommendations  SNF;Supervision/Assistance - 24 hour (unless mobility improves significantly )    Does the patient have the potential to tolerate intense rehabilitation      Barriers to Discharge        Equipment Recommendations  Rolling walker with 5" wheels    Recommendations for Other Services OT consult   Frequency Min 3X/week    Precautions / Restrictions Precautions Precautions: Fall Precaution Comments: pt is unpredicatable with his actions Restrictions Weight Bearing Restrictions: No   Pertinent Vitals/Pain Headache 8/10. Medicated just prior to session Increased HR during session-140s per RN.      Mobility  Bed Mobility Bed Mobility: Supine to Sit Supine to Sit: 4: Min guard Transfers Transfers: Sit to Stand;Stand to Sit Sit to Stand: 4: Min assist;From bed Stand to Sit: 4: Min assist;To chair/3-in-1;With armrests Ambulation/Gait Ambulation/Gait Assistance: 3: Mod assist Ambulation Distance (Feet): 35 Feet Assistive device: Rolling walker Ambulation/Gait Assistance Details: Pt with  numerous "near falls" during ambulation however pt did not allow himself to fall. Pt c/o dizziness-limited distance.  Gait Pattern: Trunk flexed;Decreased stride length;Step-through pattern    Exercises     PT Diagnosis: Difficulty walking;Abnormality of gait;Generalized weakness;Acute pain  PT Problem List: Decreased activity tolerance;Decreased balance;Decreased mobility;Decreased knowledge of use of DME;Decreased strength;Pain PT Treatment Interventions: DME instruction;Gait training;Functional mobility training;Therapeutic activities;Therapeutic exercise     PT Goals(Current goals can be found in the care plan section) Acute Rehab PT Goals Patient Stated Goal: to go to uncle's house at discharge???? PT Goal Formulation: With patient Time For Goal Achievement: 01/30/13 Potential to Achieve Goals: Good  Visit Information  Last PT Received On: 01/30/13 Assistance Needed: +1 History of Present Illness: 47 yo male admitted with tachycardia, A flutter with RVR, dizziness. Pt is homeless.        Prior Functioning  Home Living Family/patient expects to be discharged to:: Unsure Additional Comments: Pt is homeless. pt states he may d/c to his uncle's home????   house with 10 steps to enter Prior Function Level of Independence: Independent Communication Communication: No difficulties    Cognition  Cognition Arousal/Alertness: Awake/alert Behavior During Therapy: WFL for tasks assessed/performed Overall Cognitive Status: Within Functional Limits for tasks assessed    Extremity/Trunk Assessment Upper Extremity Assessment Upper Extremity Assessment: Overall WFL for tasks assessed Lower Extremity Assessment Lower Extremity Assessment: Generalized weakness (pt is not putting forth full effort for testing of LEs) Cervical / Trunk Assessment Cervical / Trunk Assessment: Normal   Balance Balance Balance Assessed: Yes Static Standing Balance Static Standing - Balance Support:  Bilateral upper extremity supported Static Standing - Level of Assistance: 3: Mod assist Dynamic Standing  Balance Dynamic Standing - Balance Support: Bilateral upper extremity supported;During functional activity Dynamic Standing - Level of Assistance: 3: Mod assist  End of Session PT - End of Session Equipment Utilized During Treatment: Gait belt Activity Tolerance: Other (comment) (Limited by pt's report of dizziness) Patient left: in chair;with call bell/phone within reach Nurse Communication: Mobility status;Precautions  GP     Rebeca Alert, MPT Pager: [phone]

## 2013-01-16 NOTE — Progress Notes (Signed)
Pt heart rate sustaining in 160's.  MD notified, orders obtained and carried out.  Will continue to monitor.
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
## 2013-01-17 DIAGNOSIS — F1994 Other psychoactive substance use, unspecified with psychoactive substance-induced mood disorder: Secondary | ICD-10-CM

## 2013-01-17 DIAGNOSIS — I4892 Unspecified atrial flutter: Secondary | ICD-10-CM

## 2013-01-17 LAB — GLUCOSE, CAPILLARY: Glucose-Capillary: 103 mg/dL — ABNORMAL HIGH (ref 70–99)

## 2013-01-17 MED ORDER — SODIUM CHLORIDE 0.9 % IV BOLUS (SEPSIS)
1000.0000 mL | Freq: Once | INTRAVENOUS | Status: AC
  Administered 2013-01-17: 1000 mL via INTRAVENOUS

## 2013-01-17 MED ORDER — METOPROLOL TARTRATE 50 MG PO TABS
50.0000 mg | ORAL_TABLET | ORAL | Status: AC
  Administered 2013-01-17: 50 mg via ORAL
  Filled 2013-01-17 (×2): qty 1

## 2013-01-17 NOTE — Progress Notes (Signed)
AT 1800 Pt went in to AFIB RVR with heart rate in 160's. MD Vanessa Barbara notified. Advised by Md to hold Pt transfer to tele, give pt 50 mg metoprolol PO and hold 25 mg PO metoprolol dose due @ 2200. Advised by MD not to start back Cardizem drip. Pt complained of anxiety and pain. Ativan and Morphine given for relief. See MAR. By 1610 Pt had converted back to NSR 60-70's. All other VSS. MD notified about conversion back to NSR. Nurse still  told to still give metoprolol. Will continue to monitor.

## 2013-01-17 NOTE — Progress Notes (Signed)
SUBJECTIVE: dizzy, no chest pain.   Tele: sinus  BP 117/74  Pulse 69  Temp(Src) 98 F (36.7 C) (Oral)  Resp 15  Ht 5\' 8"  (1.727 m)  Wt 176 lb 12.9 oz (80.2 kg)  BMI 26.89 kg/m2  SpO2 99%  Intake/Output Summary (Last 24 hours) at 01/17/13 1610 Last data filed at 01/17/13 0358  Gross per 24 hour  Intake    293 ml  Output   1100 ml  Net   -807 ml    PHYSICAL EXAM General: Well developed, well nourished, in no acute distress. Alert and oriented x 3.  Psych:  Good affect, responds appropriately Neck: No JVD. No masses noted.  Lungs: Clear bilaterally with no wheezes or rhonci noted.  Heart: RRR with no murmurs noted. Abdomen: Bowel sounds are present. Soft, non-tender.  Extremities: No lower extremity edema.   LABS: Basic Metabolic Panel:  Recent Labs  96/04/54 1120 01/15/13 1734 01/16/13 0504  NA 132* 136 136  K 4.2 3.7 3.9  CL 96 100 103  CO2 23 28 26   GLUCOSE 140* 132* 103*  BUN 16 11 12   CREATININE 0.93 0.91 0.78  CALCIUM 9.6 8.8 8.8  MG  --  2.1  --   PHOS  --  2.9  --    CBC:  Recent Labs  01/15/13 1734 01/16/13 0504  WBC 5.5 4.7  NEUTROABS 2.8  --   HGB 13.4 11.9*  HCT 38.4* 36.3*  MCV 94.6 95.3  PLT 184 144*   Cardiac Enzymes:  Recent Labs  01/15/13 2230 01/16/13 0504 01/16/13 1016  TROPONINI <0.30 <0.30 <0.30   Current Meds: . aspirin  81 mg Oral Daily  . folic acid  1 mg Oral Daily  . LORazepam  0-4 mg Intravenous Q6H   Followed by  . LORazepam  0-4 mg Intravenous Q12H  . LORazepam  1 mg Intravenous Once  . metoprolol tartrate  25 mg Oral BID  . multivitamin with minerals  1 tablet Oral Daily  . sodium chloride  3 mL Intravenous Q12H  . thiamine  100 mg Oral Daily   Or  . thiamine  100 mg Intravenous Daily   Echo 01/16/13: Left ventricle: The cavity size was normal. Wall thickness was normal. Systolic function was normal. The estimated ejection fraction was in the range of 55% to 60%. Wall motion was normal; there  were no regional wall motion abnormalities. Left ventricular diastolic function parameters were normal. There was no evidence of elevated ventricular filling pressure by Doppler parameters. - Aortic valve: Trileaflet; mildly thickened leaflets. No significant regurgitation. - Aorta: The aorta was normal, not dilated, and non-diseased. - Mitral valve: No significant regurgitation. - Left atrium: The atrium was normal in size. - Right atrium: The atrium was normal in size. - Atrial septum: No defect or patent foramen ovale was identified. - Tricuspid valve: No significant regurgitation. - Pericardium, extracardiac: There was no pericardial effusion.  ASSESSMENT AND PLAN:  1. Atrial flutter: Converted to atrial flutter yesterday, started on diltiazem drip and now back in sinus. Off of diltiazem drip. Echo normal. Would increase metoprolol to 50 mg po BID. Given his etoh abuse and social situation (homeless), not a good candidate for long term anti-coagulation. Risk for intracardiac thrombus with atrial flutter is not high.  It will likely be hard to keep him out of atrial flutter given his history of alcohol abuse. Cardizem CD is an expensive option and I am not sure he would  be compliant with that medication. He can have f/u planned with Dr. Marca Ancona in our [facility] office in several weeks.   2. Etoh abuse: Per primary team.     Verne Carrow  12/18/20146:22 AM

## 2013-01-17 NOTE — Progress Notes (Signed)
[facility] PROGRESS NOTE  [name] WUJ:[id number] DOB: 06-Nov-1965 DOA: 01/15/2013 PCP: No PCP Per Patient  Assessment/Plan: 1. Atrial flutter. Patient going into atrial flutter yesterday, started on a Cardizem drip, converted to sinus rhythm. He has been seen and evaluated by cardiology who recommended increasing his metoprolol from 25-50 mg by mouth twice a day. Over the course of the day he has remained in sinus rhythm. A transthoracic echocardiogram on 01/16/2013 showed an ejection fraction of 55-60% without wall motion abnormalities. 2. Chest pain. Likely secondary to atrial fibrillation, status post 3 negative troponins, which transthoracic echocardiogram showed an ejection fraction of 55-60% without wall motion abnormalities. 3. Alcohol withdrawal. Will transition patient to Librium 10 mg by mouth 3 times a day 4. Unsteady gait. Patient ambulated today, having unsteady gait, which I feel makes him an unsafe discharge today. This is likely due to history of alcohol abuse and going into withdrawals and this hospitalization.  Code Status: Full code Disposition Plan: Plan to transfer out of step down unit to telemetry   Consultants:  Cardiology  Procedures:  Transthoracic echocardiogram performed on 01/16/2013 showing ejection fraction of 55%   HPI/Subjective: Patient complains of dizziness particularly from going from sitting as he was orthostatic negative.to standing position. Orthostatics were checked, his blood pressures actually increased going from laying to standing. He remains unstable on his feet, was not able to do much with physical therapy today.   Objective: Filed Vitals:   01/17/13 1200  BP: 114/82  Pulse: 57  Temp:   Resp: 21    Intake/Output Summary (Last 24 hours) at 01/17/13 1532 Last data filed at 01/17/13 1300  Gross per 24 hour  Intake   1100 ml  Output   1650 ml  Net   -550 ml   Filed Weights   01/16/13 0516 01/16/13 1335 01/17/13 0500   Weight: 78.1 kg (172 lb 2.9 oz) 79.7 kg (175 lb 11.3 oz) 80.2 kg (176 lb 12.9 oz)    Exam:  General: Patient having a few tremors, however in no acute distress.  Cardiovascular: Tachycardic, NL S1S2  Respiratory: Lungs are clear to auscultation B/L, no wheezes rhochi or rales.  Abdomen: Soft NT ND  Musculoskeletal: No edema   Data Reviewed: Basic Metabolic Panel:  Recent Labs Lab 01/15/13 1120 01/15/13 1734 01/16/13 0504  NA 132* 136 136  K 4.2 3.7 3.9  CL 96 100 103  CO2 23 28 26   GLUCOSE 140* 132* 103*  BUN 16 11 12   CREATININE 0.93 0.91 0.78  CALCIUM 9.6 8.8 8.8  MG  --  2.1  --   PHOS  --  2.9  --    Liver Function Tests:  Recent Labs Lab 01/15/13 1225 01/15/13 1734 01/16/13 0504  AST 40* 43* 40*  ALT 34 36 36  ALKPHOS 75 66 63  BILITOT 0.6 0.8 0.4  PROT 6.6 6.4 5.6*  ALBUMIN 3.7 3.4* 3.0*   No results found for this basename: LIPASE, AMYLASE,  in the last 168 hours No results found for this basename: AMMONIA,  in the last 168 hours CBC:  Recent Labs Lab 01/15/13 1120 01/15/13 1734 01/16/13 0504  WBC 6.4 5.5 4.7  NEUTROABS  --  2.8  --   HGB 15.0 13.4 11.9*  HCT 42.7 38.4* 36.3*  MCV 92.8 94.6 95.3  PLT 211 184 144*   Cardiac Enzymes:  Recent Labs Lab 01/15/13 1620 01/15/13 2230 01/16/13 0504 01/16/13 1016  TROPONINI <0.30 <0.30 <0.30 <0.30   BNP (  last 3 results) No results found for this basename: PROBNP,  in the last 8760 hours CBG:  Recent Labs Lab 01/16/13 0717 01/17/13 0730  GLUCAP 93 103*    Recent Results (from the past 240 hour(s))  MRSA PCR SCREENING     Status: None   Collection Time    01/16/13  1:59 PM      Result Value Range Status   MRSA by PCR NEGATIVE  NEGATIVE Final   Comment:            The GeneXpert MRSA Assay (FDA     approved for NASAL specimens     only), is one component of a     comprehensive MRSA colonization     surveillance program. It is not     intended to diagnose MRSA     infection nor  to guide or     monitor treatment for     MRSA infections.     Studies: No results found.  Scheduled Meds: . aspirin  81 mg Oral Daily  . folic acid  1 mg Oral Daily  . LORazepam  0-4 mg Intravenous Q12H  . LORazepam  1 mg Intravenous Once  . metoprolol tartrate  25 mg Oral BID  . multivitamin with minerals  1 tablet Oral Daily  . sodium chloride  3 mL Intravenous Q12H  . thiamine  100 mg Oral Daily   Continuous Infusions: . sodium chloride 10 mL/hr at 01/17/13 1191    Active Problems:   Alcoholism /alcohol abuse   Tachycardia   Tobacco abuse   Hypertension   Alcohol withdrawal    Time spent: 35 minutes     Jeralyn Bennett  [facility] Pager [phone]. If 7PM-7AM, please contact night-coverage at [email or web address], password [facility] 01/17/2013, 3:32 PM  LOS: 2 days

## 2013-01-17 NOTE — Progress Notes (Signed)
CSW continuing to follow.  CSW met with pt at bedside.  CSW inquired with pt about his current difficulty with ambulation. Pt states that he is independent at baseline and feels that his dizziness is impacting his mobility at this time. Per chart and pt description of symptoms, pt going into withdrawals.  Pt continues to state that he plans to discharge to his uncles home. CSW explained that CSW would have to see how he progresses with mobility in order to determine safe discharge plan.   Pt appears anxious and eager to transition to another floor and asked CSW if he would be transferring today. CSW inquired with unit secretary who stated that pt has transfer orders, but there is not yet a bed available.   CSW updated pt.   CSW to follow up with pt to continue to provide support and assist with discharge planning as appropriate. Pt indicates that he feels once his dizziness is more manageable that he will not have difficulty with ambulating.    Jacklynn Lewis, MSW, LCSW Clinical Social Work [phone]

## 2013-01-17 NOTE — Progress Notes (Signed)
Physical Therapy Treatment Patient Details Name: [name] MRN: [id number] DOB: [birth year] Today's Date: 01/17/2013 Time: 2130-8657 PT Time Calculation (min): 24 min  PT Assessment / Plan / Recommendation  History of Present Illness 47 yo male admitted with tachycardia, A flutter with RVR, dizziness. Pt is homeless.    PT Comments   Pt would encouraged to sit in chair and spend time OOB however he called to go back to bed jsut after PT left.  He continues to c/o of dizziness with transitions; BPs were erratic and ?orthostatic; RN aware; encouraged gaze stabilization and keeping eyes open, pt only marginally compliant with this.  Follow Up Recommendations  SNF;Supervision/Assistance - 24 hour     Does the patient have the potential to tolerate intense rehabilitation     Barriers to Discharge        Equipment Recommendations  Rolling walker with 5" wheels    Recommendations for Other Services    Frequency Min 3X/week   Progress towards PT Goals Progress towards PT goals: Progressing toward goals  Plan Current plan remains appropriate    Precautions / Restrictions Precautions Precautions: Fall;Other (comment) Precaution Comments: pt is unpredicatable with behaviors, LOB etc; Also with BP issues this session and continues to c/o of dizziness   Pertinent Vitals/Pain Supine BP   118/79 Sitting BP    127/60 Standing BP 97/67 OOB to chair BP---> 125/77      Mobility  Bed Mobility Bed Mobility: Supine to Sit Supine to Sit: 4: Min assist Details for Bed Mobility Assistance: pt with LOB upon sitting up requiring min assist to recover and prevent forward LOB off EOB Transfers Transfers: Sit to Stand;Stand to Sit Sit to Stand: 4: Min assist;From bed Stand to Sit: 4: Min assist;To chair/3-in-1;With armrests Details for Transfer Assistance: multi-modal cues for safety  Ambulation/Gait Ambulation/Gait Assistance: 3: Mod assist Ambulation Distance (Feet): 5 Feet Assistive  device: Rolling walker Ambulation/Gait Assistance Details: pt again with c/o of severe 'spinning with transition to stand more so than to sit from supine; see below for BPs Gait Pattern: Trunk flexed;Wide base of support    Exercises     PT Diagnosis:    PT Problem List:   PT Treatment Interventions:     PT Goals (current goals can now be found in the care plan section) Acute Rehab PT Goals Patient Stated Goal: does not state Time For Goal Achievement: 01/30/13 Potential to Achieve Goals: Good  Visit Information  Last PT Received On: 01/17/13 Assistance Needed: +2 History of Present Illness: 47 yo male admitted with tachycardia, A flutter with RVR, dizziness. Pt is homeless.     Subjective Data  Patient Stated Goal: does not state   Cognition  Cognition Arousal/Alertness: Awake/alert Behavior During Therapy: WFL for tasks assessed/performed Overall Cognitive Status: Within Functional Limits for tasks assessed    Balance  Static Sitting Balance Static Sitting - Balance Support: Right upper extremity supported;No upper extremity supported;Feet supported Static Sitting - Level of Assistance: 3: Mod assist;2: Max assist;4: Min assist Static Sitting - Comment/# of Minutes: LOB anteriorly with initial sitting,  Static Standing Balance Static Standing - Balance Support: Bilateral upper extremity supported Static Standing - Level of Assistance: 3: Mod assist Dynamic Standing Balance Dynamic Standing - Balance Support: Bilateral upper extremity supported;During functional activity Dynamic Standing - Level of Assistance: 3: Mod assist;2: Max assist  End of Session PT - End of Session Equipment Utilized During Treatment: Gait belt Activity Tolerance: Other (comment) (dizziness) Patient left: in chair;with  call bell/phone within reach Nurse Communication: Mobility status;Precautions   GP     [facility] 01/17/2013, 11:36 AM

## 2013-01-17 NOTE — Progress Notes (Signed)
Patients heart rate/rhythm continues to be erratic. Sinus brady in the 30's to Afib RVR to Ventricular bigeminy and back to normal sinus. Currently patient is normal sinus to sinus brady with multiform PVC's .  Kirtland Bouchard Schorr NP for [facility] tonight, made aware of rhythm changes. No new orders given pertaining to changes. CBC and BMET for tomorrow morning. Other VSS. Patient resting comfortably in no acute distress. Will continue to monitor.

## 2013-01-18 ENCOUNTER — Encounter (HOSPITAL_COMMUNITY): Payer: Self-pay | Admitting: Emergency Medicine

## 2013-01-18 ENCOUNTER — Emergency Department (HOSPITAL_COMMUNITY)
Admission: EM | Admit: 2013-01-18 | Discharge: 2013-01-19 | Disposition: A | Discharge status: Other Acute Inpatient Hospital | Payer: Federal, State, Local not specified - Other | Attending: Emergency Medicine | Admitting: Emergency Medicine

## 2013-01-18 DIAGNOSIS — F17200 Nicotine dependence, unspecified, uncomplicated: Secondary | ICD-10-CM | POA: Insufficient documentation

## 2013-01-18 DIAGNOSIS — F10229 Alcohol dependence with intoxication, unspecified: Secondary | ICD-10-CM | POA: Insufficient documentation

## 2013-01-18 DIAGNOSIS — F1020 Alcohol dependence, uncomplicated: Secondary | ICD-10-CM

## 2013-01-18 DIAGNOSIS — I10 Essential (primary) hypertension: Secondary | ICD-10-CM

## 2013-01-18 DIAGNOSIS — F10239 Alcohol dependence with withdrawal, unspecified: Secondary | ICD-10-CM

## 2013-01-18 DIAGNOSIS — R45851 Suicidal ideations: Secondary | ICD-10-CM | POA: Insufficient documentation

## 2013-01-18 DIAGNOSIS — F411 Generalized anxiety disorder: Secondary | ICD-10-CM | POA: Insufficient documentation

## 2013-01-18 DIAGNOSIS — F329 Major depressive disorder, single episode, unspecified: Secondary | ICD-10-CM

## 2013-01-18 DIAGNOSIS — Z79899 Other long term (current) drug therapy: Secondary | ICD-10-CM | POA: Insufficient documentation

## 2013-01-18 LAB — CBC
HCT: 36.2 % — ABNORMAL LOW (ref 39.0–52.0)
HCT: 40.4 % (ref 39.0–52.0)
Hemoglobin: 12.3 g/dL — ABNORMAL LOW (ref 13.0–17.0)
MCH: 32.8 pg (ref 26.0–34.0)
MCH: 33.2 pg (ref 26.0–34.0)
MCHC: 34 g/dL (ref 30.0–36.0)
MCHC: 34.9 g/dL (ref 30.0–36.0)
MCV: 96.5 fL (ref 78.0–100.0)
MCV: 95.1 fL (ref 78.0–100.0)
Platelets: 133 10*3/uL — ABNORMAL LOW (ref 150–400)
Platelets: 169 10*3/uL (ref 150–400)
RBC: 3.75 MIL/uL — ABNORMAL LOW (ref 4.22–5.81)
RBC: 4.25 MIL/uL (ref 4.22–5.81)
RDW: 12.6 % (ref 11.5–15.5)
RDW: 12.4 % (ref 11.5–15.5)
WBC: 5.7 10*3/uL (ref 4.0–10.5)
WBC: 7 10*3/uL (ref 4.0–10.5)

## 2013-01-18 LAB — BASIC METABOLIC PANEL
BUN: 16 mg/dL (ref 6–23)
CO2: 25 mEq/L (ref 19–32)
Calcium: 8.4 mg/dL (ref 8.4–10.5)
Chloride: 103 mEq/L (ref 96–112)
Creatinine, Ser: 0.93 mg/dL (ref 0.50–1.35)
GFR calc Af Amer: >90 mL/min (ref >90)
GFR calc non Af Amer: >90 mL/min (ref >90)
Glucose, Bld: 127 mg/dL — ABNORMAL HIGH (ref 70–99)
Potassium: 3.5 mEq/L (ref 3.5–5.1)
Sodium: 136 mEq/L (ref 135–145)

## 2013-01-18 LAB — COMPREHENSIVE METABOLIC PANEL
AST: 24 U/L (ref 0–37)
Albumin: 3.7 g/dL (ref 3.5–5.2)
BUN: 10 mg/dL (ref 6–23)
CO2: 24 mEq/L (ref 19–32)
Calcium: 9.1 mg/dL (ref 8.4–10.5)
Chloride: 103 mEq/L (ref 96–112)
Creatinine, Ser: 0.72 mg/dL (ref 0.50–1.35)
GFR calc Af Amer: >90 mL/min (ref >90)
GFR calc non Af Amer: >90 mL/min (ref >90)
Total Bilirubin: 0.2 mg/dL — ABNORMAL LOW (ref 0.3–1.2)

## 2013-01-18 LAB — MAGNESIUM: Magnesium: 1.6 mg/dL (ref 1.5–2.5)

## 2013-01-18 LAB — ETHANOL: Alcohol, Ethyl (B): 143 mg/dL — ABNORMAL HIGH (ref 0–11)

## 2013-01-18 LAB — PHOSPHORUS: Phosphorus: 3.7 mg/dL (ref 2.3–4.6)

## 2013-01-18 LAB — SALICYLATE LEVEL: Salicylate Lvl: <2 mg/dL — ABNORMAL LOW (ref 2.8–20.0)

## 2013-01-18 MED ORDER — CHLORDIAZEPOXIDE HCL 10 MG PO CAPS: ORAL_CAPSULE | ORAL | Status: DC

## 2013-01-18 MED ORDER — DILTIAZEM HCL 60 MG PO TABS
60.0000 mg | ORAL_TABLET | Freq: Four times a day (QID) | ORAL | Status: DC
  Filled 2013-01-18 (×5): qty 1

## 2013-01-18 MED ORDER — DILTIAZEM HCL ER COATED BEADS 120 MG PO CP24: 120.0000 mg | ORAL_CAPSULE | Freq: Every day | ORAL | Status: DC

## 2013-01-18 MED ORDER — CHLORDIAZEPOXIDE HCL 10 MG PO CAPS: 10.0000 mg | ORAL_CAPSULE | Freq: Three times a day (TID) | ORAL | Status: DC

## 2013-01-18 NOTE — ED Notes (Signed)
[place] with c/o abd pain and wanting detox. Pt reports being d/c from [facility] this am, pt reports drinking (14) 40oz beers today since leaving ICU. Pt endorses SI
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
## 2013-01-18 NOTE — Progress Notes (Signed)
Pt received notification from RN that pt medically ready for discharge today and needs assistance with transportation home. Per RN, pt may need cab voucher, but CSW will assess further.  CSW met with pt at bedside to discuss. Pt reported that he planned to go to Uncles home and provided CSW with an address. CSW looked into the address and could not locate that address in [place]. CSW discussed with pt at bedside and notified pt that CSW could not identify address. Pt stated that he now [place] [address] his uncle gets off this evening. CSW explored with pt if there were any other supports that he could stay with. Pt did not identify anyone else. CSW offered to assist pt in arranging for a shelter and pt refused. Pt became agitated and states the he just wants a bus pass in order [place] [employer] or [place].   CSW spoke with attending MD who stated that pt is medically stable and walked well this morning and felt that if pt wished to go by bus then MD did not have an objection to that.   CSW discussed with Clinical Social Work Environmental manager, Gretta Cool who stated that if pt plans to make own arrangements for shelter then it is appropriate for CSW to provide pt with a bus pass.  CSW met with pt at bedside and offered again to assist with arranging a shelter for pt. Pt declined and stated that he planned to make his own arrangements. CSW offered pt a list of resources that included homeless shelters and pt accepted. Pt discussed that he is also familiar with the [facility]) if he needs any further assistance.   CSW provided pt bus pass and notified RN.  No further social work needs identified at this time.  CSW signing off.   Jacklynn Lewis, MSW, LCSW Clinical Social Work [phone]

## 2013-01-18 NOTE — Progress Notes (Signed)
Pt to discharge home. Patients VSS. Pt given information about Alchol Abuse and where to receive help if he so chooses. Pt given prescriptions for MD for HR and  alchol abuse. Nurse called Social Work for possible cab voucher to take patient to [employer].
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
## 2013-01-19 ENCOUNTER — Encounter (HOSPITAL_COMMUNITY): Payer: Self-pay

## 2013-01-19 ENCOUNTER — Inpatient Hospital Stay (HOSPITAL_COMMUNITY)
Admission: AD | Admit: 2013-01-19 | Discharge: 2013-01-25 | DRG: 897 | Disposition: A | Discharge status: Home / Self Care | Payer: Federal, State, Local not specified - Other | Source: Intra-hospital | Attending: Emergency Medicine | Admitting: Emergency Medicine

## 2013-01-19 DIAGNOSIS — F17200 Nicotine dependence, unspecified, uncomplicated: Secondary | ICD-10-CM | POA: Diagnosis present

## 2013-01-19 DIAGNOSIS — F341 Dysthymic disorder: Secondary | ICD-10-CM

## 2013-01-19 DIAGNOSIS — F1020 Alcohol dependence, uncomplicated: Secondary | ICD-10-CM

## 2013-01-19 DIAGNOSIS — I10 Essential (primary) hypertension: Secondary | ICD-10-CM

## 2013-01-19 DIAGNOSIS — Z5900 Homelessness unspecified: Secondary | ICD-10-CM

## 2013-01-19 DIAGNOSIS — F10929 Alcohol use, unspecified with intoxication, unspecified: Secondary | ICD-10-CM

## 2013-01-19 DIAGNOSIS — F329 Major depressive disorder, single episode, unspecified: Secondary | ICD-10-CM | POA: Diagnosis present

## 2013-01-19 DIAGNOSIS — Z91199 Patient's noncompliance with other medical treatment and regimen due to unspecified reason: Secondary | ICD-10-CM

## 2013-01-19 DIAGNOSIS — F32A Depression, unspecified: Secondary | ICD-10-CM | POA: Diagnosis present

## 2013-01-19 DIAGNOSIS — I4891 Unspecified atrial fibrillation: Secondary | ICD-10-CM | POA: Diagnosis present

## 2013-01-19 DIAGNOSIS — I498 Other specified cardiac arrhythmias: Secondary | ICD-10-CM | POA: Diagnosis present

## 2013-01-19 DIAGNOSIS — F10939 Alcohol use, unspecified with withdrawal, unspecified: Principal | ICD-10-CM

## 2013-01-19 DIAGNOSIS — F1994 Other psychoactive substance use, unspecified with psychoactive substance-induced mood disorder: Secondary | ICD-10-CM

## 2013-01-19 DIAGNOSIS — Z8249 Family history of ischemic heart disease and other diseases of the circulatory system: Secondary | ICD-10-CM

## 2013-01-19 DIAGNOSIS — IMO0001 Reserved for inherently not codable concepts without codable children: Secondary | ICD-10-CM

## 2013-01-19 DIAGNOSIS — F411 Generalized anxiety disorder: Secondary | ICD-10-CM | POA: Diagnosis present

## 2013-01-19 DIAGNOSIS — R000 Tachycardia, unspecified: Secondary | ICD-10-CM

## 2013-01-19 DIAGNOSIS — R001 Bradycardia, unspecified: Secondary | ICD-10-CM

## 2013-01-19 DIAGNOSIS — Z9119 Patient's noncompliance with other medical treatment and regimen: Secondary | ICD-10-CM

## 2013-01-19 DIAGNOSIS — F419 Anxiety disorder, unspecified: Secondary | ICD-10-CM

## 2013-01-19 DIAGNOSIS — F10239 Alcohol dependence with withdrawal, unspecified: Secondary | ICD-10-CM

## 2013-01-19 DIAGNOSIS — F3289 Other specified depressive episodes: Secondary | ICD-10-CM | POA: Diagnosis present

## 2013-01-19 DIAGNOSIS — Z720 Tobacco use: Secondary | ICD-10-CM

## 2013-01-19 LAB — RAPID URINE DRUG SCREEN, HOSP PERFORMED
Amphetamines: NOT DETECTED
Opiates: NOT DETECTED

## 2013-01-19 MED ORDER — ONDANSETRON HCL 4 MG PO TABS
4.0000 mg | ORAL_TABLET | Freq: Three times a day (TID) | ORAL | Status: DC | PRN
  Administered 2013-01-19: 4 mg via ORAL
  Filled 2013-01-19: qty 1

## 2013-01-19 MED ORDER — ONDANSETRON HCL 4 MG PO TABS
4.0000 mg | ORAL_TABLET | Freq: Three times a day (TID) | ORAL | Status: DC | PRN
  Administered 2013-01-19 – 2013-01-25 (×2): 4 mg via ORAL
  Filled 2013-01-19 (×3): qty 1

## 2013-01-19 MED ORDER — LORAZEPAM 1 MG PO TABS
0.0000 mg | ORAL_TABLET | Freq: Four times a day (QID) | ORAL | Status: DC
  Administered 2013-01-19: 1 mg via ORAL
  Administered 2013-01-19: 2 mg via ORAL
  Filled 2013-01-19: qty 1
  Filled 2013-01-19: qty 2

## 2013-01-19 MED ORDER — LORAZEPAM 1 MG PO TABS: 0.0000 mg | ORAL_TABLET | Freq: Two times a day (BID) | ORAL | Status: DC

## 2013-01-19 MED ORDER — NICOTINE 21 MG/24HR TD PT24
21.0000 mg | MEDICATED_PATCH | Freq: Every day | TRANSDERMAL | Status: DC
  Administered 2013-01-21 – 2013-01-25 (×6): 21 mg via TRANSDERMAL
  Filled 2013-01-19 (×10): qty 1

## 2013-01-19 MED ORDER — LORAZEPAM 1 MG PO TABS
0.0000 mg | ORAL_TABLET | Freq: Four times a day (QID) | ORAL | Status: AC
  Administered 2013-01-19: 2 mg via ORAL
  Administered 2013-01-20: 1 mg via ORAL
  Administered 2013-01-20: 2 mg via ORAL
  Administered 2013-01-21: 1 mg via ORAL
  Filled 2013-01-19: qty 2
  Filled 2013-01-19: qty 1
  Filled 2013-01-19: qty 2
  Filled 2013-01-19: qty 1

## 2013-01-19 MED ORDER — LORAZEPAM 1 MG PO TABS
0.0000 mg | ORAL_TABLET | Freq: Two times a day (BID) | ORAL | Status: AC
  Administered 2013-01-21: 2 mg via ORAL
  Administered 2013-01-22: 1 mg via ORAL
  Administered 2013-01-22: 2 mg via ORAL
  Administered 2013-01-23: 1 mg via ORAL
  Filled 2013-01-19: qty 2
  Filled 2013-01-19: qty 1
  Filled 2013-01-19: qty 2
  Filled 2013-01-19: qty 1

## 2013-01-19 MED ORDER — MAGNESIUM HYDROXIDE 400 MG/5ML PO SUSP
30.0000 mL | Freq: Every day | ORAL | Status: DC | PRN
  Filled 2013-01-19: qty 30

## 2013-01-19 MED ORDER — ALUM & MAG HYDROXIDE-SIMETH 200-200-20 MG/5ML PO SUSP
30.0000 mL | ORAL | Status: DC | PRN
  Filled 2013-01-19: qty 30

## 2013-01-19 MED ORDER — IBUPROFEN 600 MG PO TABS
600.0000 mg | ORAL_TABLET | Freq: Three times a day (TID) | ORAL | Status: DC | PRN
  Administered 2013-01-19: 600 mg via ORAL
  Filled 2013-01-19: qty 1

## 2013-01-19 MED ORDER — ALUM & MAG HYDROXIDE-SIMETH 200-200-20 MG/5ML PO SUSP: 30.0000 mL | ORAL | Status: DC | PRN

## 2013-01-19 MED ORDER — NICOTINE 21 MG/24HR TD PT24
21.0000 mg | MEDICATED_PATCH | Freq: Every day | TRANSDERMAL | Status: DC
  Administered 2013-01-19: 21 mg via TRANSDERMAL
  Filled 2013-01-19: qty 1

## 2013-01-19 MED ORDER — ACETAMINOPHEN 325 MG PO TABS: 650.0000 mg | ORAL_TABLET | Freq: Four times a day (QID) | ORAL | Status: DC | PRN

## 2013-01-19 MED ORDER — IBUPROFEN 200 MG PO TABS
600.0000 mg | ORAL_TABLET | Freq: Three times a day (TID) | ORAL | Status: DC | PRN
  Administered 2013-01-19: 600 mg via ORAL
  Filled 2013-01-19: qty 3

## 2013-01-19 MED ORDER — TRAZODONE HCL 50 MG PO TABS
50.0000 mg | ORAL_TABLET | Freq: Every evening | ORAL | Status: DC | PRN
  Administered 2013-01-20 – 2013-01-24 (×5): 50 mg via ORAL
  Filled 2013-01-19 (×5): qty 1
  Filled 2013-01-19: qty 28
  Filled 2013-01-19: qty 1

## 2013-01-19 NOTE — Progress Notes (Signed)
Patient did attend the evening speaker AA meeting.  

## 2013-01-19 NOTE — ED Notes (Signed)
Attempted to call report to secure area, EKG SR, Dr. Ranae Palms signed EKG, nothing acute. Tonya B. States she will call [facility] and return call when pt can be moved to area.

## 2013-01-19 NOTE — ED Provider Notes (Signed)
CSN: [id number]     Arrival date & time 01/18/13  2112 History   None    Chief Complaint  Patient presents with  . Medical Clearance   (Consider location/radiation/quality/duration/timing/severity/associated sxs/prior Treatment) HPI History provided by pt.   Pt presets w/ request for alcohol detox as well as complaint of SI.  Has been suicidal since he was 47 years old and he wants to get it taken care of today so he doesn't have to live like this for the rest of his life.  Was admitted to hospital for tachycardia secondary to alcohol withdrawal 01/15/13 until yesterday am and has had nine 40oz beers since then.  Denies abusing any other substances.  Initially reports HI, but then says that the person he wants to kill is himself.  Has h/o depression/anxiety and is compliant w/ meds.  Past Medical History  Diagnosis Date  . Hypertension   . Alcoholism /alcohol abuse   . Anxiety and depression   . Tobacco abuse    Past Surgical History  Procedure Laterality Date  . Hernia repair     Family History  Problem Relation Age of Onset  . Heart attack Mother   . Heart attack Father   . Hypertension Mother   . Hypertension Father    History  Substance Use Topics  . Smoking status: Current Every Day Smoker -- 1.00 packs/day for 30 years    Types: Cigarettes  . Smokeless tobacco: Never Used  . Alcohol Use: 0.0 oz/week     Comment: Drinks every day, 9-11 40 oz beers.     Review of Systems  All other systems reviewed and are negative.    Allergies  Bee venom and Coffee bean extract  Home Medications   Current Outpatient Rx  Name  Route  Sig  Dispense  Refill  . chlordiazePOXIDE (LIBRIUM) 10 MG capsule      Take 1 tab three times a day for 1 week, then 1 tab twice daily for 1 week, then 1 tab daily for 1 week, then stop         . diltiazem (CARDIZEM CD) 120 MG 24 hr capsule   Oral   Take 1 capsule (120 mg total) by mouth daily.   30 capsule   1    BP 106/76  Pulse 78   Temp(Src) 98.2 F (36.8 C) (Oral)  Resp 20  Wt 162 lb (73.483 kg)  SpO2 97% Physical Exam  Nursing note and vitals reviewed. Constitutional: He is oriented to person, place, and time. He appears well-developed and well-nourished. No distress.  HENT:  Head: Normocephalic and atraumatic.  Eyes:  Normal appearance  Neck: Normal range of motion.  Cardiovascular: Normal rate and regular rhythm.   Pulmonary/Chest: Effort normal and breath sounds normal. No respiratory distress.  Musculoskeletal: Normal range of motion.  Neurological: He is alert and oriented to person, place, and time.  No tremors  Skin: Skin is warm and dry. No rash noted.  Psychiatric: He has a normal mood and affect. His behavior is normal.    ED Course  Procedures (including critical care time) Labs Review Labs Reviewed  COMPREHENSIVE METABOLIC PANEL - Abnormal; Notable for the following:    Total Bilirubin 0.2 (*)    All other components within normal limits  ETHANOL - Abnormal; Notable for the following:    Alcohol, Ethyl (B) 143 (*)    All other components within normal limits  SALICYLATE LEVEL - Abnormal; Notable for the following:  Salicylate Lvl <2.0 (*)    All other components within normal limits  ACETAMINOPHEN LEVEL  CBC  URINE RAPID DRUG SCREEN (HOSP PERFORMED)   Imaging Review No results found.  EKG Interpretation   None       MDM   1. Alcoholism   2. Suicidal ideation    47yo alcoholic M, d/c'd from hospital yesterday after being treated for severe tachycardia secondary to alcohol withdrawal, presents w/ SI and request for alcohol detox.  Drank somewhere between four and nine 40oz beers today.  Medically clear; VS w/in nml range today and no sign of withdrawal on my exam.  Psych holding orders and CIWA protocol orders written. 1:22 AM     Otilio Miu, PA-C 01/19/13 [phone]

## 2013-01-19 NOTE — ED Notes (Signed)
Talked to [facility] and she said to call her when EKG was done so she could have Alberteen Sam, NP look at it. Since then EKG done, Autumn, RN Research officer, political party and told her pt's EKG showed NSR, explained to her writer would need to call the [facility] because she'd previously spoke to her about the pt and she wanted NP to review EKG. Called [facility], AC back and she said she'll have Drenda Freeze, NP look over pt's chart and call writer back.

## 2013-01-19 NOTE — Tx Team (Signed)
Initial Interdisciplinary Treatment Plan  PATIENT STRENGTHS: (choose at least two) Ability for insight General fund of knowledge  PATIENT STRESSORS: Substance abuse   PROBLEM LIST: Problem List/Patient Goals Date to be addressed Date deferred Reason deferred Estimated date of resolution  Substance Abuse 01/19/13                                                      DISCHARGE CRITERIA:  Motivation to continue treatment in a less acute level of care  PRELIMINARY DISCHARGE PLAN: Outpatient therapy  PATIENT/FAMIILY INVOLVEMENT: This treatment plan has been presented to and reviewed with the patient, [name], and/or family member.  The patient and family have been given the opportunity to ask questions and make suggestions.  Gretta Arab [facility] 01/19/2013, 2:06 PM

## 2013-01-19 NOTE — BH Assessment (Signed)
Assessment complete. Consulted with Alberteen Sam, NP who agrees Pt's criteria for inpatient admission to [facility] is weak but at Pt's request [facility] and [facility] will be contacted for consideration for their programs. If Pt is not accepted at [facility] or [facility] he will be evaluated by psychiatry for inpatient treatment at [facility]. Notified Dr. Loren Racer of recommendation.  Harlin Rain Ria Comment, [facility] Triage Specialist

## 2013-01-19 NOTE — ED Notes (Signed)
[facility] called Clinical research associate and stated that pt is ok to move back to psych ed as he isn't having any arrhythmias per Drenda Freeze, NP. Will call Autuman, RN to move pt back to room 40.

## 2013-01-19 NOTE — ED Notes (Signed)
Writer asked if pt has been on the Telemetry monitor this evening since he's been here because there wasn't a lot of information as to why he was just medically admitted other than irregular/abnormal heart rhythms. Autumn, RN said she'll get an EKG and no he hasn't been on Telemetry since his been here.

## 2013-01-19 NOTE — ED Notes (Signed)
[name] here to transport to [facility]. Belongs bag x2 given to driver. Ambulatory without difficulty. No complaints voiced.

## 2013-01-19 NOTE — BH Assessment (Signed)
Received request for tele-assessment. Spoke with Loren Racer, MD regarding Pt. Tele-assessment will be initiated.  Harlin Rain Ria Comment, [facility] Triage Specialist
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
## 2013-01-19 NOTE — Progress Notes (Signed)
Pt accepted to [facility] 301-2 by Dr. Alta Corning to the services of Dr. Dub Mikes.  Pt will go by [facility] transport.  Pt can arrive after noon on 01-19-13.  Nurse to call report [phone]  Paper work to go with pt to [facility] NP putting in med orders and will turn pt green for d/c; transition status

## 2013-01-19 NOTE — ED Notes (Addendum)
[facility] notified of transportation needed to [facility]. 
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
## 2013-01-19 NOTE — ED Notes (Signed)
Report called to Museum/gallery curator at [facility]. Has been accepted by Dr.Lugo to room 301-2. Will be transported by [facility].

## 2013-01-19 NOTE — Progress Notes (Signed)
Patient ID: [name], male   DOB: 06-Sep-1965, 47 y.o.   MRN: [id number] Pt denies SI/HI/AVH.  Pt admitted voluntarily today for ETOH.  Pt states that he drinks 9 40oz daily.  Pt states that he has been drinking heavily for the past 32 years.  He states that he began drinking at age 17.  Pt's longest period of sobriety was for 8 months in 1998.  Pt is currently homeless but lives with his uncle.  Pt states that he has a 92 year old daughter from his previous wife.  Pt states that he recently ended a 1.5 year relationship which ended because of his drinking.  Pt searched and oriented to the unit.

## 2013-01-19 NOTE — ED Provider Notes (Signed)
Medical screening examination/treatment/procedure(s) were performed by non-physician practitioner and as supervising physician I was immediately available for consultation/collaboration.  EKG Interpretation   None         Stevana Dufner, MD 01/19/13 2357 

## 2013-01-20 ENCOUNTER — Other Ambulatory Visit: Payer: Self-pay

## 2013-01-20 DIAGNOSIS — I10 Essential (primary) hypertension: Secondary | ICD-10-CM

## 2013-01-20 LAB — CBC WITH DIFFERENTIAL/PLATELET
Basophils Absolute: 0 10*3/uL (ref 0.0–0.1)
Basophils Relative: 0 % (ref 0–1)
Eosinophils Relative: 7 % — ABNORMAL HIGH (ref 0–5)
HCT: 36.8 % — ABNORMAL LOW (ref 39.0–52.0)
MCHC: 34.8 g/dL (ref 30.0–36.0)
MCV: 95.1 fL (ref 78.0–100.0)
Monocytes Absolute: 0.5 10*3/uL (ref 0.1–1.0)
Neutro Abs: 3.3 10*3/uL (ref 1.7–7.7)
RDW: 12.4 % (ref 11.5–15.5)

## 2013-01-20 LAB — BASIC METABOLIC PANEL
Calcium: 9.7 mg/dL (ref 8.4–10.5)
Chloride: 104 mEq/L (ref 96–112)
Creatinine, Ser: 0.84 mg/dL (ref 0.50–1.35)
GFR calc Af Amer: >90 mL/min (ref >90)
Potassium: 3.9 mEq/L (ref 3.5–5.1)

## 2013-01-20 LAB — TROPONIN I: Troponin I: <0.3 ng/mL (ref <0.30)

## 2013-01-20 MED ORDER — SODIUM CHLORIDE 0.9 % IV BOLUS (SEPSIS)
500.0000 mL | Freq: Once | INTRAVENOUS | Status: AC
  Administered 2013-01-20: 500 mL via INTRAVENOUS

## 2013-01-20 NOTE — ED Notes (Signed)
IV discontinued.

## 2013-01-20 NOTE — ED Provider Notes (Signed)
CSN: [id number]     Arrival date & time 01/20/13  1027 History   First MD Initiated Contact with Patient 01/20/13 0737     Chief Complaint  Patient presents with  . Chest Pain   (Consider location/radiation/quality/duration/timing/severity/associated sxs/prior Treatment) Patient is a 47 y.o. male presenting with chest pain. The history is provided by the patient.  Chest Pain Associated symptoms: no abdominal pain, no back pain, no headache, no nausea, no numbness, no shortness of breath, not vomiting and no weakness    patient presents after an episode of chest pain began around 6 morning. He was pressure on his chest and he felt lightheaded. Found to be bradycardic in the 40s. He is at behavioral health for alcohol withdrawal and treatment. His had drink in about a week per patient. He drinks around 9 40 ounces a day. He was recently admitted to hospital for an atrial fibrillation with RVR. He was switched from metoprolol to Cardizem. We are unsure of his been getting up at behavioral health. He states that he felt lightheaded. No cough. No fevers. No tremors.  Past Medical History  Diagnosis Date  . Hypertension   . Alcoholism /alcohol abuse   . Anxiety and depression   . Tobacco abuse    Past Surgical History  Procedure Laterality Date  . Hernia repair     Family History  Problem Relation Age of Onset  . Heart attack Mother   . Heart attack Father   . Hypertension Mother   . Hypertension Father    History  Substance Use Topics  . Smoking status: Current Every Day Smoker -- 1.00 packs/day for 30 years    Types: Cigarettes  . Smokeless tobacco: Never Used  . Alcohol Use: 0.0 oz/week     Comment: Drinks every day, 9-11 40 oz beers.     Review of Systems  Constitutional: Negative for activity change and appetite change.  Eyes: Negative for pain.  Respiratory: Negative for chest tightness and shortness of breath.   Cardiovascular: Positive for chest pain. Negative for leg  swelling.  Gastrointestinal: Negative for nausea, vomiting, abdominal pain and diarrhea.  Genitourinary: Negative for flank pain.  Musculoskeletal: Negative for back pain and neck stiffness.  Skin: Negative for rash.  Neurological: Positive for light-headedness. Negative for weakness, numbness and headaches.  Psychiatric/Behavioral: Positive for suicidal ideas. Negative for behavioral problems.    Allergies  Bee venom  Home Medications   Current Outpatient Rx  Name  Route  Sig  Dispense  Refill  . diltiazem (CARDIZEM CD) 120 MG 24 hr capsule   Oral   Take 1 capsule (120 mg total) by mouth daily.   30 capsule   1    BP 119/73  Pulse 64  Temp(Src) 97.8 F (36.6 C) (Oral)  Resp 20  Ht 5' 6.25" (1.683 m)  Wt 161 lb (73.029 kg)  BMI 25.78 kg/m2  SpO2 98% Physical Exam  Nursing note and vitals reviewed. Constitutional: He is oriented to person, place, and time. He appears well-developed and well-nourished.  HENT:  Head: Normocephalic and atraumatic.  Eyes: EOM are normal. Pupils are equal, round, and reactive to light.  Neck: Normal range of motion. Neck supple.  Cardiovascular: Regular rhythm and normal heart sounds.   No murmur heard. Bradycardia  Pulmonary/Chest: Effort normal and breath sounds normal.  Abdominal: Soft. Bowel sounds are normal. He exhibits no distension and no mass. There is no tenderness. There is no rebound and no guarding.  Musculoskeletal: Normal  range of motion. He exhibits no edema.  Neurological: He is alert and oriented to person, place, and time. No cranial nerve deficit.  Skin: Skin is warm and dry.  Psychiatric: He has a normal mood and affect.    ED Course  Procedures (including critical care time) Labs Review Labs Reviewed  CBC WITH DIFFERENTIAL - Abnormal; Notable for the following:    RBC 3.87 (*)    Hemoglobin 12.8 (*)    HCT 36.8 (*)    Eosinophils Relative 7 (*)    All other components within normal limits  BASIC METABOLIC  PANEL - Abnormal; Notable for the following:    Glucose, Bld 114 (*)    All other components within normal limits  TROPONIN I  MAGNESIUM   Imaging Review No results found.  EKG Interpretation    Date/Time:  Sunday January 20 2013 07:42:03 EST Ventricular Rate:  42 PR Interval:  141 QRS Duration: 88 QT Interval:  444 QTC Calculation: 371 R Axis:   60 Text Interpretation:  Sinus bradycardia Confirmed by Stockton Nunley  MD, Rolanda Campa ([id number]) on 01/20/2013 7:58:57 AM            MDM   1. Bradycardia    Patient sent from behavioral health for bradycardia. Had chest pain. Doubt ischemia as the cause of the bradycardia. Has had some bradycardia previously and also had atrial fibrillation RVR and was started on Cardizem in the hospital. From what I can tell from the medical records patient is not have Cardizem at least 24 hours, potentially more. His blood pressures been maintaining his been able to light. I requested a consult by cardiology, who saw him in the hospital about continuation of the medication versus other treatment. In the 5 hours since I have called and they have not been at bedside yet. We are waiting the consult to transfer back to behavioral health vs further monitoring. Heart rate is improved somewhat.   Juliet Rude. Rubin Payor, MD 01/20/13 1452

## 2013-01-20 NOTE — ED Notes (Addendum)
PT transferred from [facility] this AM for reported [place] and CP. Per report of EMS PT had a HR of 33. Pt was last seen at [facility] long for rapid HR in 150's. EMS gave  Calcium gluconate IV , sodium bicarb 50 and 2 glucagon. EMS reports peak T-waves. Pt reports last alcohol  One week ago.

## 2013-01-20 NOTE — Progress Notes (Signed)
Patient provided his consent for this Writer to e-mail the [facility] and help himt  get established with a PCP.

## 2013-01-20 NOTE — Progress Notes (Signed)
Patient ID: [name], male   DOB: [birth year], 47 y.o.   MRN: [id number] D)  Has been out and about on the hall this evening, attended group came to med window afterward, said he was feeling a little anxious.  Talked about his episode of tachycardia at the ED, worries that it can happen again.  Has been pleasant and cooperative, interacting appropriately with staff and peers. A)  Will continue to mnitor for safety, continue POC R)  Safety maintained.

## 2013-01-20 NOTE — Progress Notes (Signed)
Patient ID: [name], male   DOB: 1965-08-17, 47 y.o.   MRN: [id number] Pt awoke feeling weak, somewhat SOB, pale and c/o chest pressure, pain rated at 4.  Pale, warm and dry,  Taken to treatment room , EKG run showing sinus brady 40's.  NP shown EKG, also shopwing ST changes.  Will be transferring to [facility] for eval.

## 2013-01-20 NOTE — Progress Notes (Signed)
Case Manager consult.Met patient at bedside.Patient reports he was transferred from behavioral health to [facility] ED Secondary to Chest pain.Patient reports he is feeling  Much better.Patient reports when is discharged form behavioral health he can stay with his uncle in [place] but he would like a Barrister's clerk as another option.Patient was provided With the [facility] and [employer] list.Patient reports he wants to turn things around and look at his options for Inpatient / Outpatient Detox programs.Patient was provided With the Substance Abuse treatment list.Patient has per face sheet information Primary Coverage with [facility]. Information resource sheet provided to patient for [facility].Patient provided with resource sheet for Social services /Health Department.Patient reports he does not have a PCP. Education provided on the Importance of establishing a PCP.Information provided to patient on the [facility].This Clinical research associate explained to the patient with his consent  She was able to e-mail the [facility] and help with the appointment set up.Patient provided the contact number of a family friend.He reports he will inform his friend the Clinic will be  Calling his phone.Teach back method used to ensure patient understanding.

## 2013-01-20 NOTE — ED Provider Notes (Signed)
1600 - Care from Dr. Rubin Payor. 63M here with bradycardia from behavioral health. On cardizem for Afib.  [facility] evaluated patient, they want to hold his cardizem. He can be transferred back to behavioral health as this bradycardia has happened previously. EMTALA done, Dr. Dub Mikes accepting back to behavioral health.  1. Bradycardia   2. Alcohol dependence   3. Alcohol withdrawal   4. Hypertension      Dagmar Hait, MD 01/20/13 1718

## 2013-01-20 NOTE — ED Notes (Signed)
PT Hr 39  On assessment . Pt placed on zole.

## 2013-01-20 NOTE — Consult Note (Signed)
CARDIOLOGY CONSULT NOTE   Patient ID: [name] MRN: [id number] DOB/AGE: 47-16-1967 47 y.o.  Admit date: 01/19/2013  Primary Physician   No PCP Per Patient Has not seen a PCP for 8-10 years Primary Cardiologist   New Reason for Consultation   A flutter   HPI: [name] is a 47 y.o. homeless male with a history of alcholol abuse, 32 pack year tobacco abuse, HTN, and anxiety/depression who was recently hospitalized with atrial flutter with RVR that cardioverted on Cardizem drip. The patient was jsut discherged yesterday and sent to behavioral health for withdrawal therapy. It is unclear if he was getting PO cardizem at behavioral health.   He states that this morning he had an episode of SOB. EMS was called and per patient he received iv meds (Ca gluconate, Na bicarb) after which he developed bradycardia  For which we are consulted. While in the ER his HR was down to upper 30'.   The patient he now completely asymptomatic, wants to return to behavioral health. His HR is in 60', he denies any chest pain, SOB, lightheadedness, or palpitations.    Past Medical History  Diagnosis Date  . Hypertension   . Alcoholism /alcohol abuse   . Anxiety and depression   . Tobacco abuse      Past Surgical History  Procedure Laterality Date  . Hernia repair      Allergies  Allergen Reactions  . Bee Venom Anaphylaxis    Uses epi pen-- last used 08/23/11    I have reviewed the patient's current medications . LORazepam  0-4 mg Oral Q6H   Followed by  . [START ON 01/21/2013] LORazepam  0-4 mg Oral Q12H  . nicotine  21 mg Transdermal Daily      Prior to Admission medications   Not on File     History   Social History  . Marital Status: Single    Spouse Name: N/A    Number of Children: N/A  . Years of Education: N/A   Occupational History  . Not on file.   Social History Main Topics  . Smoking status: Current Every Day Smoker -- 1.00 packs/day for 30 years    Types:  Cigarettes  . Smokeless tobacco: Never Used  . Alcohol Use: 0.0 oz/week     Comment: Drinks every day, 9-11 40 oz beers.   . Drug Use: No  . Sexual Activity: Yes    Birth Control/ Protection: Condom   Other Topics Concern  . Not on file   Social History Narrative   He is currently homeless and sleeps in an abandoned truck in sleeping bags. He works part time for [employer]. He has a 72 year old daughter who lives in [place]. He is originally from [place].     Family Status  Relation Status Death Age  . Mother Other     estranged, does not know  . Father Other     estranged, does not know   Family History  Problem Relation Age of Onset  . Heart attack Mother   . Heart attack Father   . Hypertension Mother   . Hypertension Father      ROS:  Full 14 point review of systems complete and found to be negative unless listed above.  Physical Exam: Blood pressure 116/70, pulse 59, temperature 97.8 F (36.6 C), temperature source Oral, resp. rate 18, height 5' 6.25" (1.683 m), weight 161 lb (73.029 kg), SpO2 97.00%.  General: Well developed, well nourished, male in no acute distress Head: Eyes PERRLA, No xanthomas.   Normocephalic and atraumatic, oropharynx without edema or exudate. :  Lungs: CTAB Heart: HRRR S1 S2, no rub/gallop, irregular rate and rhythm with S1, S2  murmur. pulses are 2+ extrem.   Neck: No carotid bruits. No lymphadenopathy.  JVD. Abdomen: Bowel sounds present, abdomen soft and Very tender to palpation in all quadrants. No masses or hernias noted. Msk:  No spine or cva tenderness. No weakness, no joint deformities or effusions. Extremities: No clubbing or cyanosis.  No edema.  Neuro: Alert and oriented X 3. No focal deficits noted. Psych:  Good affect, responds appropriately Skin: Scrapes on knuckles of hands, bruising on face. Ruddy appearing .  Labs:   Lab Results  Component Value Date   WBC 5.5 01/20/2013   HGB 12.8* 01/20/2013   HCT  36.8* 01/20/2013   MCV 95.1 01/20/2013   PLT 155 01/20/2013      Recent Labs Lab 01/18/13 2215 01/20/13 0758  NA 140 140  K 3.7 3.9  CL 103 104  CO2 24 30  BUN 10 19  CREATININE 0.72 0.84  CALCIUM 9.1 9.7  PROT 7.1  --   BILITOT 0.2*  --   ALKPHOS 70  --   ALT 31  --   AST 24  --   GLUCOSE 90 114*  ALBUMIN 3.7  --    TTE 01/16/2013  - Left ventricle: The cavity size was normal. Wall thickness was normal. Systolic function was normal. The estimated ejection fraction was in the range of 55% to 60%. Wall motion was normal; there were no regional wall motion abnormalities. Left ventricular diastolic function parameters were normal. There was no evidence of elevated ventricular filling pressure by Doppler parameters. - Aortic valve: Trileaflet; mildly thickened leaflets. No significant regurgitation. - Aorta: The aorta was normal, not dilated, and non-diseased. - Mitral valve: No significant regurgitation. - Left atrium: The atrium was normal in size. - Right atrium: The atrium was normal in size. - Atrial septum: No defect or patent foramen ovale was identified. - Tricuspid valve: No significant regurgitation. - Pericardium, extracardiac: There was no pericardial effusion. Impressions:  - Normal study.  ECG:  SR, HR 69 BPM Telemetry; Sinus bradycardia in 40' and short episodes of atrial fibrillation   Radiology:  No results found.   ASSESSMENT AND PLAN:    Active Problems:   Alcohol dependence   1. Sinus bradycardia  And short episodes of atrial fibrillation This was happening during the last admission as well and is suspicious for SSS. Foe now we would recommend to hold AV blocking agents (cardizem). Considering his a-fib he is not a great candidate for long term anticoagulation as he is homeless and non-compliant with his meds.  CHADSVASc score 1. His echocardiogram was completely normal.  He can be transferred back to behavioral health.   2. Alcohol  withdrawal- AST slightly elevated - on PRN Lorazepam  3. History of hematemsis and Hematochezia -- H/H normal   Signed: Lars Masson, MD 01/20/2013 4:28 PM

## 2013-01-20 NOTE — Progress Notes (Signed)
Adult Psychoeducational Group Note  Date:  01/20/2013 Time:  9:43 PM  Group Topic/Focus:  AA group  Participation Level:  Active  Participation Quality:  Appropriate  Affect:  Appropriate  Cognitive:  Alert  Insight: Appropriate  Engagement in Group:  Engaged  Modes of Intervention:  Discussion  Additional Comments:    Flonnie Hailstone 01/20/2013, 9:43 PM

## 2013-01-21 DIAGNOSIS — F1020 Alcohol dependence, uncomplicated: Secondary | ICD-10-CM

## 2013-01-21 NOTE — Progress Notes (Signed)
The focus of this group is to help patients review their daily goal of treatment and discuss progress on daily workbooks. Pt attended the evening group session and responded to all discussion prompts from the Writer. Pt shared that today was a good day and that he was feeling very focused on his after care plans. He also shared that he felt like he didn't follow through with after care following his last admission and wanted to learn from that mistake. Pt reported having no additional needs from Nursing Staff. Pt's affect was appropriate.

## 2013-01-21 NOTE — BHH Group Notes (Signed)
[facility] LCSW Aftercare Discharge Planning Group Note   01/21/2013 12:28 PM  Participation Quality:  Active and cooperative  Mood/Affect:  Anxious very pressured speech  Depression Rating:  Reports low depression  Anxiety Rating:  High anxiety as he reports he wants to get into [facility]  Thoughts of Suicide:  No Will you contract for safety?   Yes  Current AVH:  No  Plan for Discharge/Comments:  Patient reports he needs long term treatment and a short stay with detox will not help him. He shares he has called everyday to get into [facility] and his date of admission is 12.27. LCSW will call and confirm. Patient is very anxious and pressured in speech.  Transportation Means: Part Bus to [employer].   Supports: Has an Uncle who he was living with, but is embarrassed to return after his behavior.  Nail, Catalina Gravel

## 2013-01-21 NOTE — Progress Notes (Signed)
Adult Psychoeducational Group Note  Date:  01/21/2013 Time:  1:14 PM  Group Topic/Focus:  Self-Care Assessment  Participation Level:  Active  Participation Quality:  Appropriate, Sharing and Supportive  Affect:  Appropriate  Cognitive:  Alert and Appropriate  Insight: Appropriate  Engagement in Group:  Engaged and Supportive  Modes of Intervention:  Discussion, Education and Exploration  Additional Comments:  During this group Writer discussed the importance of Wellness and Self care. Patients had the opportunity to complete self care assessment in daily packet and discuss the results of assessment. Patients and Clinical research associate discussed the importance of working on the weak parts of the assessment and how to improve them  Lauralee Evener 01/21/2013, 1:14 PM
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
## 2013-01-21 NOTE — Progress Notes (Signed)
Patient ID: [name], male   DOB: [birth year], 46 y.o.   MRN: [id number] D)   Spent most of the day at the ED being evaluated, returned shortly before the beginning of this shift.  States is feeling a little better, although still somewhat lightheaded and c/o palpitations at times.  Has been interacting appropriately with staff and peers, was seen laughing and making some jokes, attended group and participating.  Came to med window afterward,  Requested 1 mg ativan rather than 2 mg, states will try to use less meds if he can, and only if he needs them.   A)  Will continue to monitor for safety, encourage  Participation in the milieu. R)  Safety maintained
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
## 2013-01-21 NOTE — Progress Notes (Signed)
Adult Psychoeducational Group Note  Date:  01/21/2013 Time:  11:00AM Group Topic/Focus:  Therapuetic Activity  Participation Level:  Active  Participation Quality:  Appropriate and Attentive  Affect:  Appropriate  Cognitive:  Alert and Appropriate  Insight: Appropriate  Engagement in Group:  Engaged  Modes of Intervention:  Discussion  Additional Comments: Pt. Was attentive and appropriate during today's therapeutic activity. Pt was able to discuss developing a wellness toolbox. Pt stated that he is willing to try to do things different and have a better attitude towards life.    Bing Plume D 01/21/2013, 11:08 AM
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
## 2013-01-21 NOTE — Progress Notes (Signed)
Patient ID: [name], male   DOB: 1965-05-02, 48 y.o.   MRN: [id number] He has been up[ and to groups interacting with peers and staff.He Is concerned about having ;ow potassium like they said he had in the Hospital. NP will look at labs results. He has been up and to groups interacting with peers and staff. Self inventory: depression 9, hopelessness 9, denies SI thoughts, and indicated  Having diarrhea, agitation, tremors but has not requested a prn.

## 2013-01-21 NOTE — Progress Notes (Signed)
D   Pt reports feeling depressed and anxious   He denies suicidal ideation at present but said if he began to feel that way he would talk to staff   He attends and participates in groups  He reports minimal signs of withdrawal  He interacts appropriately with others A   Verbal support given   Medications administered and effectiveness monitored   Q 15 min checks R   Pt safe at present

## 2013-01-22 DIAGNOSIS — I498 Other specified cardiac arrhythmias: Secondary | ICD-10-CM

## 2013-01-22 DIAGNOSIS — F10239 Alcohol dependence with withdrawal, unspecified: Principal | ICD-10-CM

## 2013-01-22 DIAGNOSIS — F17200 Nicotine dependence, unspecified, uncomplicated: Secondary | ICD-10-CM

## 2013-01-22 DIAGNOSIS — F10939 Alcohol use, unspecified with withdrawal, unspecified: Principal | ICD-10-CM

## 2013-01-22 DIAGNOSIS — F1010 Alcohol abuse, uncomplicated: Secondary | ICD-10-CM

## 2013-01-22 DIAGNOSIS — F1994 Other psychoactive substance use, unspecified with psychoactive substance-induced mood disorder: Secondary | ICD-10-CM

## 2013-01-22 MED ORDER — HYDROXYZINE HCL 25 MG PO TABS
25.0000 mg | ORAL_TABLET | ORAL | Status: DC | PRN
  Administered 2013-01-22 – 2013-01-25 (×5): 25 mg via ORAL
  Filled 2013-01-22 (×5): qty 1

## 2013-01-22 NOTE — Progress Notes (Signed)
[facility] MD Progress Note  01/22/2013 11:08 AM [name]  MRN:  [id number]  Subjective:  [name] is doing fairly well. However, is over concerned about where to go from here after discharge. He is hoping and wishing to get into the [facility] for substance abuse treatment. He is currently endorsing tremors, fatigue and restlessness. Denies SIHI, AVH.  Diagnosis:   DSM5: Schizophrenia Disorders:  NA Obsessive-Compulsive Disorders:  NA Trauma-Stressor Disorders:  NA Substance/Addictive Disorders:  Alcohol Related Disorder - Severe (303.90) Depressive Disorders:  NA  Axis I: Alcohol Related Disorder - Severe (303.90) Axis II: Deferred Axis III:  Past Medical History  Diagnosis Date  . Hypertension   . Alcoholism /alcohol abuse   . Anxiety and depression   . Tobacco abuse    Axis IV: other psychosocial or environmental problems and Alcoholism Axis V: 62  ADL's:  Fairly intact  Sleep: Good  Appetite:  Good  Suicidal Ideation:  Plan:  Denies Intent:  Denies Means:  Denies  Homicidal Ideation:  Plan:  Denies Intent:  Denies Means:  Denies AEB (as evidenced by):  Psychiatric Specialty Exam: Review of Systems  Constitutional: Negative.   HENT: Negative.   Eyes: Negative.   Respiratory: Negative.   Cardiovascular: Negative.   Gastrointestinal: Negative.   Genitourinary: Negative.   Musculoskeletal: Negative.   Skin: Negative.   Neurological: Negative.   Endo/Heme/Allergies: Negative.   Psychiatric/Behavioral: Positive for substance abuse (Alcoholism). Negative for depression, suicidal ideas, hallucinations and memory loss. The patient has insomnia. The patient is not nervous/anxious.     Blood pressure 148/92, pulse 76, temperature 99.1 F (37.3 C), temperature source Oral, resp. rate 18, height 5' 6.25" (1.683 m), weight 73.029 kg (161 lb), SpO2 99.00%.Body mass index is 25.78 kg/(m^2).  General Appearance: Casual and Fairly Groomed  Patent attorney::   Good  Speech:  Clear and Coherent  Volume:  Normal  Mood:  Anxious  Affect:  Flat  Thought Process:  Coherent and Intact  Orientation:  Full (Time, Place, and Person)  Thought Content:  Rumination  Suicidal Thoughts:  No  Homicidal Thoughts:  No  Memory:  Immediate;   Good Recent;   Good Remote;   Good  Judgement:  Fair  Insight:  Lacking  Psychomotor Activity:  Restlessness  Concentration:  Fair  Recall:  Good  Akathisia:  No  Handed:  Right  AIMS (if indicated):     Assets:  Communication Skills Desire for Improvement  Sleep:  Number of Hours: 5   Current Medications: Current Facility-Administered Medications  Medication Dose Route Frequency Provider Last Rate Last Dose  . acetaminophen (TYLENOL) tablet 650 mg  650 mg Oral Q6H PRN Earney Navy, NP      . alum & mag hydroxide-simeth (MAALOX/MYLANTA) 200-200-20 MG/5ML suspension 30 mL  30 mL Oral PRN Earney Navy, NP      . alum & mag hydroxide-simeth (MAALOX/MYLANTA) 200-200-20 MG/5ML suspension 30 mL  30 mL Oral Q4H PRN Earney Navy, NP      . ibuprofen (ADVIL,MOTRIN) tablet 600 mg  600 mg Oral Q8H PRN Earney Navy, NP   600 mg at 01/19/13 2147  . LORazepam (ATIVAN) tablet 0-4 mg  0-4 mg Oral Q12H Earney Navy, NP   1 mg at 01/22/13 0811  . magnesium hydroxide (MILK OF MAGNESIA) suspension 30 mL  30 mL Oral Daily PRN Earney Navy, NP      . nicotine (NICODERM CQ - dosed in mg/24 hours)  patch 21 mg  21 mg Transdermal Daily Earney Navy, NP   21 mg at 01/22/13 0814  . ondansetron (ZOFRAN) tablet 4 mg  4 mg Oral Q8H PRN Earney Navy, NP   4 mg at 01/19/13 2150  . traZODone (DESYREL) tablet 50 mg  50 mg Oral QHS PRN,MR X 1 Earney Navy, NP   50 mg at 01/21/13 2247    Lab Results: No results found for this or any previous visit (from the past 48 hour(s)).  Physical Findings: AIMS: Facial and Oral Movements Muscles of Facial Expression: None, normal Lips and Perioral Area:  None, normal Jaw: None, normal Tongue: None, normal,Extremity Movements Upper (arms, wrists, hands, fingers): None, normal Lower (legs, knees, ankles, toes): None, normal, Trunk Movements Neck, shoulders, hips: None, normal, Overall Severity Severity of abnormal movements (highest score from questions above): None, normal Incapacitation due to abnormal movements: None, normal Patient's awareness of abnormal movements (rate only patient's report): No Awareness, Dental Status Current problems with teeth and/or dentures?: No Does patient usually wear dentures?: No  CIWA:  CIWA-Ar Total: 1 COWS:     Treatment Plan Summary: Daily contact with patient to assess and evaluate symptoms and progress in treatment Medication management  Plan: Supportive approach/coping skills/relapse prevention. Encouraged out of room, participation in group sessions and application of coping skills when distressed. Will continue to monitor response to/adverse effects of medications in use to assure effectiveness. Continue to monitor mood, behavior and interaction with staff and other patients. Discharge plan in progress. Continue current plan of care.  Medical Decision Making Problem Points:  Review of last therapy session (1) and Review of psycho-social stressors (1) Data Points:  Review of medication regiment & side effects (2) Review of new medications or change in dosage (2)  I certify that inpatient services furnished can reasonably be expected to improve the patient's condition.   Sanjuana Kava, PMHNP, FNP 01/22/2013, 11:08 AM

## 2013-01-22 NOTE — [facility] Group Notes (Signed)
[facility] LCSW Group Therapy  01/22/2013 1:30 PM  Type of Therapy:  Group Therapy  Participation Level:  Active  Participation Quality:  Attentive  Affect:  Appropriate  Cognitive:  Alert and Oriented  Insight:  Improving  Engagement in Therapy:  Improving  Modes of Intervention:  Discussion, Education, Exploration, Problem-solving, Rapport Building, Socialization and Support  Summary of Progress/Problems: MHA Speaker came to talk about his personal journey with substance abuse and addiction. The pt processed ways by which to relate to the speaker. MHA speaker provided handouts and educational information pertaining to groups and services offered by the [facility]. [name] was attentive and engaged throughout today's group. He actively listened as speaker shared his personal experience regarding SA and MI. [name] followed along as speaker reviewed various groups/services offered by [facility].    Smart, HeatherLCSWA 01/22/2013, 1:30 PM

## 2013-01-22 NOTE — Progress Notes (Signed)
Recreation Therapy Notes   Date: 12.23.2014 Time: 3:00pm Location: [address]   Group Topic: Communication, Team Building, Problem Solving  Goal Area(s) Addresses:  Patient will effectively work with peer towards shared goal.  Patient will identify skill used to make activity successful.  Patient will identify how skills used during activity can be used to reach post d/c goals.   Behavioral Response: Engaged, Attentive, Appropriate   Intervention: Problem Solving Activitiy  Activity: Life Boat. Patients were given a scenario about being on a sinking yacht. Patients were informed the yacht included 15 guest, 8 of which could be placed on the life boat, along with all group members. Individuals on guest list were of varying socioeconomic classes such as a Education officer, museum, Materials engineer, Midwife, Tree surgeon.   Education: Pharmacist, community, Discharge Planning   Education Outcome: Acknowledges understanding  Clinical Observations/Feedback: Patient actively engaged in group activity, voicing her opinion and debating with peers appropriately. Patient made no contributions to group discussion, but appeared to actively listen as he maintained appropriate eye contact with speaker.   Marykay Lex Mahari Vankirk, LRT/CTRS  Jearl Klinefelter 01/22/2013 4:45 PM

## 2013-01-22 NOTE — Progress Notes (Signed)
Patient ID: [name], male   DOB: 04-03-1965, 46 y.o.   MRN: [id number] He has been up and to groups interacting with peers and staff. Self inventory:  Depression 8, hopelessness 7, w/d,s diarrhea depression H/As, agitation, tremors, and blurred vision. He has not requested and prn medications.   Continue to monitor.

## 2013-01-22 NOTE — Progress Notes (Signed)
Recreation Therapy Notes  Animal-Assisted Activity/Therapy (AAA/T) Program Checklist/Progress Notes Patient Eligibility Criteria Checklist & Daily Group note for Rec Tx Intervention  Date: 12.23.2014 Time: 2:30pm Location: [address]   AAA/T Program Assumption of Risk Form signed by Patient/ or Parent Legal Guardian yes  Patient is free of allergies or sever asthma yes  Patient reports no fear of animals yes  Patient reports no history of cruelty to animals yes   Patient understands his/her participation is voluntary yes  Patient washes hands before animal contact yes  Patient washes hands after animal contact yes  Behavioral Response: Appropriate   Education: Hand Washing, Appropriate Animal Interaction   Education Outcome: Acknowledges understanding  Clinical Observations/Feedback: Patient actively engaged in group session with therapeutic dog team.   Jearl Klinefelter, LRT/CTRS  Jearl Klinefelter 01/22/2013 4:44 PM

## 2013-01-22 NOTE — Progress Notes (Signed)
Adult Psychoeducational Group Note  Date:  01/22/2013 Time:  1:30 PM  Group Topic/Focus:  Recovery Goals:   The focus of this group is to identify appropriate goals for recovery and establish a plan to achieve them.  Participation Level:  Active  Participation Quality:  Appropriate, Attentive and Sharing  Affect:  Appropriate and Tearful  Cognitive:  Alert and Appropriate  Insight: Good  Engagement in Group:  Engaged  Modes of Intervention:  Activity, Discussion, Education, Exploration, Socialization and Support  Additional Comments:  Pt came to group and began tearful when talking about what is standing between him and recovery. Pt shared that alcohol and forgiveness are standing between him and recovery. Pt plans on changing this by attending [facility] and going to therapy and learning how to forgive himself.    Cathlean Cower 01/22/2013, 1:30 PM

## 2013-01-22 NOTE — Tx Team (Signed)
Interdisciplinary Treatment Plan Update (Adult)   Date: 01/22/2013   Time Reviewed: 11:09 AM   Progress in Treatment:  Attending groups: Yes  Participating in groups: Yes  Taking medication as prescribed: Yes  Tolerating medication: Yes  Family/Significant othe contact made: none. Patient refused. Completed PSA with patient.  Patient understands diagnosis: Yes  Discussing patient identified problems/goals with staff: Yes  Medical problems stabilized or resolved: Yes  Denies suicidal/homicidal ideation: Yes  Issues/concerns per patient self-inventory: None identified  Other: N/A   New problem(s) identified: None Identified   Reason for Continuation of Hospitalization:  Anxiety  Depression  Medication stabilization  Withdrawal symptoms   Interventions implemented related to continuation of hospitalization: mood stabilization, medication monitoring and adjustment, group therapy and psycho education, safety checks q 15 mins   Additional comments: N/A   Estimated length of stay: 2-3 days  Discharge Plan: Pt will follow up with [facility] as he has completed prescreen and also [facility] for medications and therapy.    Review of initial/current patient see care plan   Attendees:  Patient:    01/22/2013 11:09 AM  Physician:  01/22/2013 11:09 AM  Nursing:    01/22/2013 11:09 AM  Clinical Social Worker: Ashley Jacobs, LCSW 01/22/2013 11:09 AM  Other:  01/22/2013 11:09 AM  Other:    Other:    Other:    Other:    Scribe for Treatment Team:  Ashley Jacobs, LCSW 01/22/2013 11:09 AM

## 2013-01-22 NOTE — [facility] Suicide Risk Assessment (Signed)
[facility] INPATIENT:  Family/Significant Other Suicide Prevention Education  Suicide Prevention Education:  Patient Refusal for Family/Significant Other Suicide Prevention Education: The patient [name] has refused to provide written consent for family/significant other to be provided Family/Significant Other Suicide Prevention Education during admission and/or prior to discharge.  Physician notified.  Nail, Catalina Gravel 01/22/2013, 11:11 AM

## 2013-01-22 NOTE — Progress Notes (Deleted)
Recreation Therapy Notes   Animal-Assisted Activity/Therapy (AAA/T) Program Checklist/Progress Notes Patient Eligibility Criteria Checklist & Daily Group note for Rec Tx Intervention  Date: 12.23.2014 Time: 2:30pm Location: [address]   AAA/T Program Assumption of Risk Form signed by Patient/ or Parent Legal Guardian yes  Patient is free of allergies or sever asthma yes  Patient reports no fear of animals yes  Patient reports no history of cruelty to animals yes   Patient understands his/her participation is voluntary yes  Behavioral Response: Did not attend.   Nicolas Sisler L Aundre Hietala, LRT/CTRS  Gelena Klosinski L 01/22/2013 4:25 PM 

## 2013-01-22 NOTE — Progress Notes (Deleted)
Recreation Therapy Notes  Date: 12.23.2014 Time: 3:00pm Location: [address]   Group Topic: Communication, Team Building, Problem Solving  Goal Area(s) Addresses:  Patient will effectively work with peer towards shared goal.  Patient will identify skill used to make activity successful.  Patient will identify how skills used during activity can be used to reach post d/c goals.   Behavioral Response: Did not attend.   Teya Otterson L Ravi Tuccillo, LRT/CTRS  [name] L 01/22/2013 4:29 PM 

## 2013-01-22 NOTE — Progress Notes (Signed)
D   Pt is anxious and irritable at times   He attends and participates in groups and interacts well with others   He reports some withdrawal symptoms which are relieved by ativan   Pt expresses concerns for his treatment after he is discharged from [facility]   A   Verbal support given   Medications administered and effectiveness monitored   Discussed treatment options for after care   Q 15 min checks R   Pt safe at present

## 2013-01-22 NOTE — Progress Notes (Signed)
Attended AA group 

## 2013-01-23 DIAGNOSIS — F341 Dysthymic disorder: Secondary | ICD-10-CM

## 2013-01-23 MED ORDER — LOPERAMIDE HCL 2 MG PO CAPS: 2.0000 mg | ORAL_CAPSULE | Freq: Three times a day (TID) | ORAL | Status: DC | PRN

## 2013-01-23 NOTE — Progress Notes (Signed)
Patient ID: [name], male   DOB: [birth year], 47 y.o.   MRN: [id number] He has been up and to groups interacting with peers and staff. He was anxious this AM and was given vistaril prn and he calmed. Stated that he was worried about being discharged home Friday.

## 2013-01-23 NOTE — Progress Notes (Addendum)
Chaplain provided spiritual support with pt in response to nursing referral.  Pt processed motivation for recovery with chaplain.  Discussed areas of strength, worked with chaplain on planning for and coping with life after discharge.  Pt shared prayers with chaplain.  Chaplain will follow up 9:30 Friday morning prior to pt's anticipated discharge.   Belva Crome MDiv

## 2013-01-23 NOTE — Progress Notes (Signed)
[facility] MD Progress Note  01/23/2013 11:53 AM [name]  MRN:  [id number] Subjective:  [name] reports improvement in depression, anxiety, and alcohol-related symptoms. Reports improvement in tremors, but says he feels very jittery. Reports nausea with 3-4x daily of diarrhea. Denies SI, HI, Psychosis, seizures, or diaphoresis. He is hoping to get into [name] now as his job is in [name] and his boss provided him with assurance that he can resume his job. He states that he wants closer to 30 days of tx vs. 14 days.  Pt reports hx of blackouts.    Diagnosis:   DSM5: Substance/Addictive Disorders:  Alcohol Related Disorder - Severe (303.90)  Axis I: Alcohol Related Disorder - Severe (303.90) Axis II: Deferred Axis III:  Past Medical History  Diagnosis Date  . Hypertension   . Alcoholism /alcohol abuse   . Anxiety and depression   . Tobacco abuse    Axis IV: economic problems, occupational problems and problems related to social environment Axis V: 41-50 serious symptoms  ADL's:  Intact  Sleep: Fair4.5hrs  Appetite:  Fair (approximately 40%)  Suicidal Ideation:  Denies Homicidal Ideation:  Denies AEB (as evidenced by):  Psychiatric Specialty Exam: Review of Systems  Constitutional: Negative.   HENT: Negative for congestion, ear discharge, ear pain, hearing loss, nosebleeds, sore throat and tinnitus.   Eyes: Negative.   Respiratory: Negative.  Negative for stridor.   Cardiovascular: Negative.   Gastrointestinal: Positive for nausea and diarrhea. Negative for heartburn, vomiting, abdominal pain, constipation, blood in stool and melena.  Musculoskeletal: Negative.   Skin: Negative.   Neurological: Positive for tremors and headaches. Negative for dizziness, tingling, sensory change, speech change, focal weakness, seizures and loss of consciousness.  Endo/Heme/Allergies: Negative.   Psychiatric/Behavioral: Positive for depression and substance abuse. Negative for suicidal  ideas, hallucinations and memory loss. The patient is nervous/anxious. The patient does not have insomnia.     Blood pressure 110/77, pulse 76, temperature 97.5 F (36.4 C), temperature source Oral, resp. rate 18, height 5' 6.25" (1.683 m), weight 73.029 kg (161 lb), SpO2 99.00%.Body mass index is 25.78 kg/(m^2).  General Appearance: Casual  Eye Contact::  Good  Speech:  Clear and Coherent and Normal Rate  Volume:  Normal  Mood:  Anxious  Affect:  Appropriate  Thought Process:  Coherent  Orientation:  Full (Time, Place, and Person)  Thought Content:  WDL  Suicidal Thoughts:  No  Homicidal Thoughts:  No  Memory:  Immediate;   Good Recent;   Good Remote;   Good  Judgement:  Good  Insight:  Good  Psychomotor Activity:  Restlessness  Concentration:  Good  Recall:  Good  Akathisia:  No  Handed:  Right  AIMS (if indicated):     Assets:  Communication Skills Desire for Improvement Housing Resilience Social Support  Sleep:  4.5   Current Medications: Current Facility-Administered Medications  Medication Dose Route Frequency Provider Last Rate Last Dose  . acetaminophen (TYLENOL) tablet 650 mg  650 mg Oral Q6H PRN Earney Navy, NP      . alum & mag hydroxide-simeth (MAALOX/MYLANTA) 200-200-20 MG/5ML suspension 30 mL  30 mL Oral PRN Earney Navy, NP      . alum & mag hydroxide-simeth (MAALOX/MYLANTA) 200-200-20 MG/5ML suspension 30 mL  30 mL Oral Q4H PRN Earney Navy, NP      . hydrOXYzine (ATARAX/VISTARIL) tablet 25 mg  25 mg Oral Q4H PRN Sanjuana Kava, NP   25 mg at 01/23/13 0956  .  ibuprofen (ADVIL,MOTRIN) tablet 600 mg  600 mg Oral Q8H PRN Earney Navy, NP   600 mg at 01/19/13 2147  . magnesium hydroxide (MILK OF MAGNESIA) suspension 30 mL  30 mL Oral Daily PRN Earney Navy, NP      . nicotine (NICODERM CQ - dosed in mg/24 hours) patch 21 mg  21 mg Transdermal Daily Earney Navy, NP   21 mg at 01/23/13 0806  . ondansetron (ZOFRAN) tablet 4 mg   4 mg Oral Q8H PRN Earney Navy, NP   4 mg at 01/19/13 2150  . traZODone (DESYREL) tablet 50 mg  50 mg Oral QHS PRN,MR X 1 Earney Navy, NP   50 mg at 01/22/13 2158    Lab Results: No results found for this or any previous visit (from the past 48 hour(s)).  Physical Findings: AIMS: Facial and Oral Movements Muscles of Facial Expression: None, normal Lips and Perioral Area: None, normal Jaw: None, normal Tongue: None, normal,Extremity Movements Upper (arms, wrists, hands, fingers): None, normal Lower (legs, knees, ankles, toes): None, normal, Trunk Movements Neck, shoulders, hips: None, normal, Overall Severity Severity of abnormal movements (highest score from questions above): None, normal Incapacitation due to abnormal movements: None, normal Patient's awareness of abnormal movements (rate only patient's report): No Awareness , Dental Status Current problems with teeth and/or dentures?: No Does patient usually wear dentures?: No  CIWA:  CIWA-Ar Total: 1 COWS:     Treatment Plan Summary: Daily contact with patient to assess and evaluate symptoms and progress in treatment Medication management  Plan: Supportive approach/coping skills/relapse prevention. Encouraged out of room, participation in group sessions and application of coping skills when distressed. Will continue to monitor response to/adverse effects of medications in use to assure effectiveness. Continue to monitor mood, behavior and interaction with staff and other patients. Discharge plan in progress. Continue current plan of care.  Medical Decision Making Problem Points:  Review of last therapy session (1) and Review of psycho-social stressors (1) Data Points:  Review and summation of old records (2) Review of medication regiment & side effects (2) Review of new medications or change in dosage (2)  I certify that inpatient services furnished can reasonably be expected to improve the patient's  condition.   [name], [place], FNP-BC 01/23/2013, 11:53 AM  I agreed with the findings, treatment and disposition plan of this patient. Robert Sharper, MD

## 2013-01-23 NOTE — Progress Notes (Signed)
Patient ID: [name], male   DOB: [birth year], 47 y.o.   MRN: [id number] Pt attended Pharmacy Group.

## 2013-01-23 NOTE — Progress Notes (Signed)
Pt came to med window, upset saying he was anxious. Pt said he didn't know why he was anxious, but then stated he was anxious about leaving Friday.

## 2013-01-23 NOTE — Progress Notes (Signed)
Attended Group

## 2013-01-24 NOTE — Progress Notes (Signed)
Patient ID: [name], male   DOB: 11-20-1965, 47 y.o.   MRN: [id number] [facility] MD Progress Note  01/24/2013 9:14 AM [name]  MRN:  [id number]  Subjective:  [name] reports improved mood. States he is ready to be discharged to go to [facility] in am for 2 weeks. After [facility], will take some days off of work and go to [facility] for further treatment. Currently denies any new issues and or concerns, rather same anxiety coming from not knowing what to expect after treatment.  Diagnosis:   DSM5: Substance/Addictive Disorders:  Alcohol Related Disorder - Severe (303.90)  Axis I: Alcohol Related Disorder - Severe (303.90) Axis II: Deferred Axis III:  Past Medical History  Diagnosis Date  . Hypertension   . Alcoholism /alcohol abuse   . Anxiety and depression   . Tobacco abuse    Axis IV: economic problems, occupational problems and problems related to social environment Axis V: 41-50 serious symptoms  ADL's:  Intact  Sleep: Fair4.5hrs  Appetite:  Fair (approximately 40%)  Suicidal Ideation:  Denies Homicidal Ideation:  Denies AEB (as evidenced by):  Psychiatric Specialty Exam: Review of Systems  Constitutional: Negative.   HENT: Negative for congestion, ear discharge, ear pain, hearing loss, nosebleeds, sore throat and tinnitus.   Eyes: Negative.   Respiratory: Negative.  Negative for stridor.   Cardiovascular: Negative.   Gastrointestinal: Positive for nausea and diarrhea. Negative for heartburn, vomiting, abdominal pain, constipation, blood in stool and melena.  Musculoskeletal: Negative.   Skin: Negative.   Neurological: Positive for tremors and headaches. Negative for dizziness, tingling, sensory change, speech change, focal weakness, seizures and loss of consciousness.  Endo/Heme/Allergies: Negative.   Psychiatric/Behavioral: Positive for depression and substance abuse. Negative for suicidal ideas, hallucinations and memory loss. The patient is nervous/anxious. The patient does  not have insomnia.     Blood pressure 111/72, pulse 70, temperature 98.5 F (36.9 C), temperature source Oral, resp. rate 16, height 5' 6.25" (1.683 m), weight 73.029 kg (161 lb), SpO2 99.00%.Body mass index is 25.78 kg/(m^2).  General Appearance: Casual  Eye Contact::  Good  Speech:  Clear and Coherent and Normal Rate  Volume:  Normal  Mood:  "improving"  Affect:  Appropriate  Thought Process:  Coherent  Orientation:  Full (Time, Place, and Person)  Thought Content:  WDL  Suicidal Thoughts:  No  Homicidal Thoughts:  No  Memory:  Immediate;   Good Recent;   Good Remote;   Good  Judgement:  Good  Insight:  Good  Psychomotor Activity:  Normal  Concentration:  Good  Recall:  Good  Akathisia:  No  Handed:  Right  AIMS (if indicated):     Assets:  Communication Skills Desire for Improvement Housing Resilience Social Support  Sleep:  4.5   Current Medications: Current Facility-Administered Medications  Medication Dose Route Frequency Provider Last Rate Last Dose  . acetaminophen (TYLENOL) tablet 650 mg  650 mg Oral Q6H PRN Earney Navy, NP      . alum & mag hydroxide-simeth (MAALOX/MYLANTA) 200-200-20 MG/5ML suspension 30 mL  30 mL Oral PRN Earney Navy, NP      . alum & mag hydroxide-simeth (MAALOX/MYLANTA) 200-200-20 MG/5ML suspension 30 mL  30 mL Oral Q4H PRN Earney Navy, NP      . hydrOXYzine (ATARAX/VISTARIL) tablet 25 mg  25 mg Oral Q4H PRN Sanjuana Kava, NP   25 mg at 01/23/13 2248  . ibuprofen (ADVIL,MOTRIN) tablet 600 mg  600 mg Oral Q8H  PRN Earney Navy, NP   600 mg at 01/19/13 2147  . loperamide (IMODIUM) capsule 2 mg  2 mg Oral TID PRN Beau Fanny, FNP      . magnesium hydroxide (MILK OF MAGNESIA) suspension 30 mL  30 mL Oral Daily PRN Earney Navy, NP      . nicotine (NICODERM CQ - dosed in mg/24 hours) patch 21 mg  21 mg Transdermal Daily Earney Navy, NP   21 mg at 01/24/13 4098  . ondansetron (ZOFRAN) tablet 4 mg  4 mg Oral  Q8H PRN Earney Navy, NP   4 mg at 01/19/13 2150  . traZODone (DESYREL) tablet 50 mg  50 mg Oral QHS PRN,MR X 1 Earney Navy, NP   50 mg at 01/23/13 2248    Lab Results: No results found for this or any previous visit (from the past 48 hour(s)).  Physical Findings: AIMS: Facial and Oral Movements Muscles of Facial Expression: None, normal Lips and Perioral Area: None, normal Jaw: None, normal Tongue: None, normal,Extremity Movements Upper (arms, wrists, hands, fingers): None, normal Lower (legs, knees, ankles, toes): None, normal, Trunk Movements Neck, shoulders, hips: None, normal, Overall Severity Severity of abnormal movements (highest score from questions above): None, normal Incapacitation due to abnormal movements: None, normal Patient's awareness of abnormal movements (rate only patient's report): No Awareness , Dental Status Current problems with teeth and/or dentures?: No Does patient usually wear dentures?: No  CIWA:  CIWA-Ar Total: 1 COWS:     Treatment Plan Summary: Daily contact with patient to assess and evaluate symptoms and progress in treatment Medication management  Plan: Supportive approach/coping skills/relapse prevention. Encouraged out of room, participation in group sessions and application of coping skills when distressed. Will continue to monitor response to/adverse effects of medications in use to assure effectiveness. Continue to monitor mood, behavior and interaction with staff and other patients. Discharge plan in progress, will be going to [facility] treatment center in am Continue current plan of care.  Medical Decision Making Problem Points:  Review of last therapy session (1) and Review of psycho-social stressors (1) Data Points:  Review and summation of old records (2) Review of medication regiment & side effects (2) Review of new medications or change in dosage (2)  I certify that inpatient services furnished can reasonably be  expected to improve the patient's condition.   Armandina Stammer I, FNP-BC 01/24/2013, 9:14 AM  I agreed with the findings, treatment and disposition plan of this patient. Kathryne Sharper, MD

## 2013-01-25 MED ORDER — DILTIAZEM HCL ER COATED BEADS 120 MG PO CP24
120.0000 mg | ORAL_CAPSULE | Freq: Every day | ORAL | Status: DC
  Filled 2013-01-25 (×2): qty 14

## 2013-01-25 MED ORDER — HYDROXYZINE HCL 25 MG PO TABS
25.0000 mg | ORAL_TABLET | Freq: Three times a day (TID) | ORAL | Status: DC
  Filled 2013-01-25 (×2): qty 1
  Filled 2013-01-25 (×2): qty 42
  Filled 2013-01-25: qty 1
  Filled 2013-01-25: qty 42

## 2013-01-25 MED ORDER — HYDROXYZINE HCL 25 MG PO TABS: 25.0000 mg | ORAL_TABLET | Freq: Three times a day (TID) | ORAL | Status: DC

## 2013-01-25 MED ORDER — TRAZODONE HCL 50 MG PO TABS: 50.0000 mg | ORAL_TABLET | Freq: Every evening | ORAL | Status: DC | PRN

## 2013-01-25 NOTE — Progress Notes (Signed)
D   Pt is pleasant and appropriate   He denies suicidal ideation  Reports he is going to [facility] but doesn't know exactly what time tomorrow   He reports being nervous about it  A   Verbal support given   Medications administered and effectiveness monitored   Q 15 min checks R   Pt safe at present

## 2013-01-25 NOTE — Progress Notes (Signed)
[facility] Adult Case Management Discharge Plan :  Will you be returning to the same living situation after discharge: No.[facility] At discharge, do you have transportation home?:Yes,  [facility] provided transport Do you have the ability to pay for your medications:Yes,  mental health  Release of information consent forms completed and submitted to Medical Records by CSW.  Patient to Follow up at: Follow-up Information   Follow up with [facility]. (Walk in between 8am-9am Monday through Friday for hospital followup/medication management/assessment for therapy services. )    Contact information:   [address], [place] [address] Phone: [phone] Fax: [phone]      Follow up with [facility] On 01/25/2013. ([facility] will transport you to facility at 10:15AM)    Contact information:   [address] [place], [place] [address] Phone: [phone] Fax: [phone]      Patient denies SI/HI:   Yes,  during group/self report.    Safety Planning and Suicide Prevention discussed:  Yes,  SPE completed with pt as he refused to consent to family contact. SPI pamphlet provided to pt and he was encouraged to share information with support network, ask questions, and talk about any concerns relating to SPE.  Smart, HeatherLCSWA  01/25/2013, 11:17 AM

## 2013-01-25 NOTE — Progress Notes (Signed)
Patient ID: [name], male   DOB: Sep 17, 1965, 47 y.o.   MRN: [id number] Discharge orders received.AVS reviewed, copy provided. Rx given as well as free medication supply. All belongings returned. Pt verbalized understanding of discharge plan and follow up care. Pt aware of crisis services. Pt denies any SI/HI A/V Hallucinations.No sings of acute decompensation. Patient escorted to lobby to care of [facility] staff.

## 2013-01-25 NOTE — Discharge Summary (Signed)
Physician Discharge Summary Note  Patient:  [name] is an 47 y.o., male MRN:  [id number] DOB:  [birth year] Patient phone:  [phone] (home)  Patient address:   [address] [place] [address],   Date of Admission:  01/19/2013 Date of Discharge: 01/25/2013  Reason for Admission:  Alcohol detox/dependency  Discharge Diagnoses: Active Problems:   Alcoholism /alcohol abuse   Anxiety and depression   Alcohol dependence  Review of Systems  Constitutional: Negative.   HENT: Negative.   Eyes: Negative.   Respiratory: Negative.   Cardiovascular: Negative.   Gastrointestinal: Negative.   Genitourinary: Negative.   Musculoskeletal: Negative.   Skin: Negative.   Neurological: Negative.   Endo/Heme/Allergies: Negative.   Psychiatric/Behavioral: Positive for substance abuse. The patient is nervous/anxious.     DSM5:  Substance/Addictive Disorders:  Alcohol Related Disorder - Severe (303.90), Alcohol Intoxication with Use Disorder - Severe (F10.229) and Alcohol Withdrawal (291.81)  Axis Diagnosis:   AXIS I:  Alcohol Abuse, Generalized Anxiety Disorder and Substance Induced Mood Disorder AXIS II:  Deferred AXIS III:   Past Medical History  Diagnosis Date  . Hypertension   . Alcoholism /alcohol abuse   . Anxiety and depression   . Tobacco abuse    AXIS IV:  economic problems, other psychosocial or environmental problems, problems related to social environment and problems with primary support group AXIS V:  61-70 mild symptoms  Level of Care:  [facility]  Hospital Course:  On admission:  47 year old Caucasian male. Admitted to [facility] the [facility] ED with complaints of alcohol intoxication requesting detox. Patient reports, I was taken to the [facility] ED on Saturday by an ambulance. I was actually at the bus stop waiting for the bus when it happened. I was also drinking quite a bit prior to this. All I realized was that I started feeling very  bad. My heart was hurting. I lost my balance and hit my face on the concrete. Then an ambulance was called and I was taken to the hospital. While at the hospital, I was hooked on a heart monitor. An EKG was done as well. I was told that the result was good and I was sent up here for alcohol detox. I was in this hospital 9 months ago. After discharge, I stayed sober for 8 months. Then I met a lady that I loved so much. I gave her my heart. She later told me that she was never in love with me. I was crushed. I felt so bad that I needed to feel better. I relapsed. I have been drinking 7-10 (40 oz) beer daily. I drink flat out beer, no liquor or drugs. Right now, I realized that I needed to focus on myself for a change. I need to get sober again. Right now, I'm feeling irritable, shaky, I feel nervous and depressed. I am disappointed in myself as as you are looking at me".   During hospitalization:  Medications managed--[facility]rium alcohol detox protocol utilized successfully.  Vistaril 25 mg TID for anxiety and Trazodone 50 mg.  Jonny Ruiz attended and participated in therapy.  Patient denied suicidal/homicidal ideations and auditory/visual hallucinations, follow-up appointments encouraged to attend, outside support groups encouraged and information given, Rx given with a 14 day supply of medications.  Hulbert is mentally and physically stable for discharge.  He will continue his rehab at [facility].  Consults:  None  Significant Diagnostic Studies:  labs: completed, reviewed, stable  Discharge Vitals:   Blood  pressure 110/70, pulse 62, temperature 97.5 F (36.4 C), temperature source Oral, resp. rate 17, height 5' 6.25" (1.683 m), weight 73.029 kg (161 lb), SpO2 99.00%. Body mass index is 25.78 kg/(m^2). Lab Results:   No results found for this or any previous visit (from the past 72 hour(s)).  Physical Findings: AIMS: Facial and Oral Movements Muscles of Facial Expression: None, normal Lips and Perioral Area: None,  normal Jaw: None, normal Tongue: None, normal,Extremity Movements Upper (arms, wrists, hands, fingers): None, normal Lower (legs, knees, ankles, toes): None, normal, Trunk Movements Neck, shoulders, hips: None, normal, Overall Severity Severity of abnormal movements (highest score from questions above): None, normal Incapacitation due to abnormal movements: None, normal Patient's awareness of abnormal movements (rate only patient's report): No Awareness, Dental Status Current problems with teeth and/or dentures?: No Does patient usually wear dentures?: No  CIWA:  CIWA-Ar Total: 1 COWS:     Psychiatric Specialty Exam: See Psychiatric Specialty Exam and Suicide Risk Assessment completed by Attending Physician prior to discharge.  Discharge destination:  [facility]  Is patient on multiple antipsychotic therapies at discharge:  No   Has Patient had three or more failed trials of antipsychotic monotherapy by history:  No  Recommended Plan for Multiple Antipsychotic Therapies: NA  Discharge Orders   Future Orders Complete By Expires   Activity as tolerated - No restrictions  As directed    Diet - low sodium heart healthy  As directed        Medication List       Indication   CARDIZEM CD 120 MG 24 hr capsule  Generic drug:  diltiazem  Take 1 capsule (120 mg total) by mouth daily.   Indication:  High Blood Pressure     hydrOXYzine 25 MG tablet  Commonly known as:  ATARAX/VISTARIL  Take 1 tablet (25 mg total) by mouth 3 (three) times daily.   Indication:  Anxiety Neurosis     traZODone 50 MG tablet  Commonly known as:  DESYREL  Take 1 tablet (50 mg total) by mouth at bedtime as needed and may repeat dose one time if needed for sleep.            Follow-up Information   Follow up with [facility]. (Walk in between 8am-9am Monday through Friday for hospital followup/medication management/assessment for therapy services. )    Contact information:   [address], [place]  [address] Phone: [phone] Fax: [phone]      Follow up with [facility] On 01/25/2013. (Pt accepted for Friday. Transportation arrangments must be made. 14 day med supply needed. )    Contact information:   [address] [place], [place] [address] Phone: [phone] Fax: [phone]      Follow-up recommendations:  Activity:  as tolerated Diet:  Low-sodium heart healthy diet  Comments:  Take all your medications as prescribed by your mental healthcare provider. Report any adverse effects and or reactions from your medicines to your outpatient provider promptly. Patient is instructed and cautioned to not engage in alcohol and or illegal drug use while on prescription medicines. In the event of worsening symptoms, patient is instructed to call the crisis hotline, 911 and or go to the nearest ED for appropriate evaluation and treatment of symptoms. Follow-up with your primary care provider for your other medical issues, concerns and or health care needs.  Total Discharge Time:  Greater than 30 minutes.  SignedNanine Means, PMH-NP 01/25/2013, 9:37 AM  I have personally seen the patient  and agreed with the findings and involved in the treatment plan and disposition. Kathryne Sharper, MD

## 2013-01-25 NOTE — [facility] Suicide Risk Assessment (Signed)
Suicide Risk Assessment  Discharge Assessment     Demographic Factors:  Male, Divorced or widowed and Caucasian  Mental Status Per Nursing Assessment::   On Admission:  NA  Current Mental Status by Physician: See note Patient seen chart reviewed.  Patient is anxious but cooperative.  His speech is fast but clear and coherent.  He describes his mood as nervous and his affect is mood appropriate.  He denies any auditory or visual hallucination.  He denies any active or passive suicidal thoughts or homicidal thoughts there were no paranoia, delusions obsession comes in at this time.  He has no tremors, shakes or any muscle stiffness.  Psychomotor activity is slightly increased.  There were no flight of ideas or any looseness session.  He is anxious about discharge today however he like to get long-term treatment after he complete the treatment at [facility].  He is alert and oriented x3.  His insight judgment and impulse control is okay.  Loss Factors: No significant changes or stressor  Historical Factors: Family history of mental illness or substance abuse and Impulsivity  Risk Reduction Factors:   Employed, Living with another person, especially a relative, Positive social support, Positive therapeutic relationship and Positive coping skills or problem solving skills  Continued Clinical Symptoms:  Alcohol/Substance Abuse/Dependencies Previous Psychiatric Diagnoses and Treatments  Cognitive Features That Contribute To Risk:  Closed-mindedness    Suicide Risk:  Minimal: No identifiable suicidal ideation.  Patients presenting with no risk factors but with morbid ruminations; may be classified as minimal risk based on the severity of the depressive symptoms  Discharge Diagnoses:   AXIS I:  Substance Abuse and Alcohol dependence AXIS II:  Deferred AXIS III:   Past Medical History  Diagnosis Date  . Hypertension   . Alcoholism /alcohol abuse   . Anxiety and depression   . Tobacco abuse     AXIS IV:  other psychosocial or environmental problems and problems related to social environment AXIS V:  61-70 mild symptoms  Plan Of Care/Follow-up recommendations:  Activity:  As tolerated Diet:  Unchanged from the past Other:  Discharged to [facility]  Is patient on multiple antipsychotic therapies at discharge:  No   Has Patient had three or more failed trials of antipsychotic monotherapy by history:  No  Recommended Plan for Multiple Antipsychotic Therapies: NA  Myrtle Haller T. 01/25/2013, 9:13 AM

## 2013-01-25 NOTE — Progress Notes (Signed)
Patient ID: [name], male   DOB: 02-Dec-1965, 47 y.o.   MRN: [id number] D. Patient in am presents with anxious mood, affect congruent. He states '' I need to talk to you, I'm supposed to be leaving today, but I don't know I'm just so nervous, what time am I supposed to be going ? When is the doctor going to be in here? You see I'm just so nervous and I think that I need more time here, I've got shakiness and I'm nauseated and I threw up last night and had diarrhea I don't want to go to [facility]'' Patient completed self inventory and rates depression at 8/10 on depression scale, 10 being worst 1 being least. Noted of above complaints that staff have not observed any emesis, vomiting or diarrhea from patient., Vitals stable. A. Discussed above information with Molli Knock NP , and heather SW in regards to d/c to [facility]. Support and encouragement provided. Medications given as ordered. R. Patient sitting in dayroom interactive with peers. In no acute distress at this time. Will continue to monitor q 15 minutes for safety.

## 2013-01-25 NOTE — Progress Notes (Signed)
Chaplain follow up re: discharge, anxiety.  Pt spoke about motivation for recovery, coping skills for anxiety (deep breathing, praying, exercise).  Pt shared prayers with chaplain.

## 2013-01-29 NOTE — Progress Notes (Signed)
Patient Discharge Instructions:  After Visit Summary (AVS):   Faxed to:  01/29/13 Discharge Summary Note:   Faxed to:  01/29/13 Psychiatric Admission Assessment Note:   Faxed to:  01/29/13 Suicide Risk Assessment - Discharge Assessment:   Faxed to:  01/29/13 Faxed/Sent to the Next Level Care provider:  01/29/13 Faxed to [facility] @ [phone] Faxed to [facility] @ [phone]  Jerelene Redden, 01/29/2013, 3:32 PM

## 2013-05-04 ENCOUNTER — Inpatient Hospital Stay (HOSPITAL_COMMUNITY)
Admission: EM | Admit: 2013-05-04 | Discharge: 2013-05-09 | DRG: 885 | Disposition: A | Discharge status: Other Acute Inpatient Hospital | Payer: Federal, State, Local not specified - Other | Attending: Internal Medicine | Admitting: Internal Medicine

## 2013-05-04 ENCOUNTER — Encounter (HOSPITAL_COMMUNITY): Payer: Self-pay | Admitting: Emergency Medicine

## 2013-05-04 ENCOUNTER — Other Ambulatory Visit: Payer: Self-pay

## 2013-05-04 DIAGNOSIS — Z7982 Long term (current) use of aspirin: Secondary | ICD-10-CM

## 2013-05-04 DIAGNOSIS — F1020 Alcohol dependence, uncomplicated: Secondary | ICD-10-CM

## 2013-05-04 DIAGNOSIS — Z79899 Other long term (current) drug therapy: Secondary | ICD-10-CM

## 2013-05-04 DIAGNOSIS — I10 Essential (primary) hypertension: Secondary | ICD-10-CM

## 2013-05-04 DIAGNOSIS — Z91038 Other insect allergy status: Secondary | ICD-10-CM

## 2013-05-04 DIAGNOSIS — F10939 Alcohol use, unspecified with withdrawal, unspecified: Secondary | ICD-10-CM

## 2013-05-04 DIAGNOSIS — Z7189 Other specified counseling: Secondary | ICD-10-CM

## 2013-05-04 DIAGNOSIS — I959 Hypotension, unspecified: Secondary | ICD-10-CM | POA: Diagnosis present

## 2013-05-04 DIAGNOSIS — F1994 Other psychoactive substance use, unspecified with psychoactive substance-induced mood disorder: Secondary | ICD-10-CM | POA: Diagnosis present

## 2013-05-04 DIAGNOSIS — F329 Major depressive disorder, single episode, unspecified: Principal | ICD-10-CM

## 2013-05-04 DIAGNOSIS — F17200 Nicotine dependence, unspecified, uncomplicated: Secondary | ICD-10-CM | POA: Diagnosis present

## 2013-05-04 DIAGNOSIS — Z634 Disappearance and death of family member: Secondary | ICD-10-CM

## 2013-05-04 DIAGNOSIS — F419 Anxiety disorder, unspecified: Secondary | ICD-10-CM

## 2013-05-04 DIAGNOSIS — I498 Other specified cardiac arrhythmias: Secondary | ICD-10-CM | POA: Diagnosis present

## 2013-05-04 DIAGNOSIS — F10239 Alcohol dependence with withdrawal, unspecified: Secondary | ICD-10-CM

## 2013-05-04 DIAGNOSIS — F32A Depression, unspecified: Secondary | ICD-10-CM

## 2013-05-04 DIAGNOSIS — F411 Generalized anxiety disorder: Secondary | ICD-10-CM | POA: Diagnosis present

## 2013-05-04 DIAGNOSIS — I4891 Unspecified atrial fibrillation: Secondary | ICD-10-CM

## 2013-05-04 DIAGNOSIS — Z8249 Family history of ischemic heart disease and other diseases of the circulatory system: Secondary | ICD-10-CM

## 2013-05-04 DIAGNOSIS — R45851 Suicidal ideations: Secondary | ICD-10-CM

## 2013-05-04 DIAGNOSIS — IMO0001 Reserved for inherently not codable concepts without codable children: Secondary | ICD-10-CM

## 2013-05-04 DIAGNOSIS — Z720 Tobacco use: Secondary | ICD-10-CM

## 2013-05-04 LAB — COMPREHENSIVE METABOLIC PANEL
ALBUMIN: 4.4 g/dL (ref 3.5–5.2)
ALK PHOS: 68 U/L (ref 39–117)
ALT: 21 U/L (ref 0–53)
AST: 23 U/L (ref 0–37)
BILIRUBIN TOTAL: 0.5 mg/dL (ref 0.3–1.2)
BUN: 9 mg/dL (ref 6–23)
CHLORIDE: 97 meq/L (ref 96–112)
CO2: 19 mEq/L (ref 19–32)
Calcium: 9.3 mg/dL (ref 8.4–10.5)
Creatinine, Ser: 0.83 mg/dL (ref 0.50–1.35)
GFR calc Af Amer: >90 mL/min (ref >90)
Glucose, Bld: 62 mg/dL — ABNORMAL LOW (ref 70–99)
Potassium: 4 mEq/L (ref 3.7–5.3)
Sodium: 134 mEq/L — ABNORMAL LOW (ref 137–147)
Total Protein: 7.7 g/dL (ref 6.0–8.3)

## 2013-05-04 LAB — CBC
HEMATOCRIT: 39.2 % (ref 39.0–52.0)
HEMOGLOBIN: 13.9 g/dL (ref 13.0–17.0)
MCH: 32.6 pg (ref 26.0–34.0)
MCHC: 35.5 g/dL (ref 30.0–36.0)
MCV: 91.8 fL (ref 78.0–100.0)
Platelets: 207 10*3/uL (ref 150–400)
RBC: 4.27 MIL/uL (ref 4.22–5.81)
RDW: 13.1 % (ref 11.5–15.5)
WBC: 5 10*3/uL (ref 4.0–10.5)

## 2013-05-04 LAB — LIPASE, BLOOD: LIPASE: 29 U/L (ref 11–59)

## 2013-05-04 LAB — ETHANOL: ALCOHOL ETHYL (B): 168 mg/dL — AB (ref 0–11)

## 2013-05-04 MED ORDER — SODIUM CHLORIDE 0.9 % IV BOLUS (SEPSIS)
1000.0000 mL | Freq: Once | INTRAVENOUS | Status: AC
  Administered 2013-05-05: 1000 mL via INTRAVENOUS

## 2013-05-04 MED ORDER — ZOLPIDEM TARTRATE 5 MG PO TABS
5.0000 mg | ORAL_TABLET | Freq: Every evening | ORAL | Status: DC | PRN
  Administered 2013-05-05: 5 mg via ORAL
  Filled 2013-05-04: qty 1

## 2013-05-04 MED ORDER — ALUM & MAG HYDROXIDE-SIMETH 200-200-20 MG/5ML PO SUSP
30.0000 mL | ORAL | Status: DC | PRN
Start: 2013-05-04 — End: 2013-05-09
  Administered 2013-05-09: 30 mL via ORAL
  Filled 2013-05-04: qty 30

## 2013-05-04 MED ORDER — ONDANSETRON HCL 4 MG PO TABS: 4.0000 mg | ORAL_TABLET | Freq: Three times a day (TID) | ORAL | Status: DC | PRN

## 2013-05-04 MED ORDER — ONDANSETRON 8 MG PO TBDP
8.0000 mg | ORAL_TABLET | Freq: Once | ORAL | Status: AC
  Administered 2013-05-04: 8 mg via ORAL
  Filled 2013-05-04: qty 1

## 2013-05-04 MED ORDER — ACETAMINOPHEN 325 MG PO TABS: 650.0000 mg | ORAL_TABLET | ORAL | Status: DC | PRN

## 2013-05-04 NOTE — BH Assessment (Signed)
Received a call for a tele-assessment. Spoke with Robert Anda, PA-C who stated that patient was brought in voluntarily by [employer] because a friend was concern about patient's safety. It has been reported that the patient has been drinking for the past two days. His daughter recently died in [place] a few days ago. Pt is endorsing suicidal ideations stating "I will kill myself by slitting my throat". Pt has also made statements in the past after his mother passed away. Pt has a history of anxiety and depression. Pt is reporting auditory hallucinations stating that he can hear his daughter's voices saying "I love you daddy. Assessment will be initiated.

## 2013-05-04 NOTE — ED Notes (Signed)
Pt here voluntarily with [employer] from [facility], he states he will kill hisself tonight because his daughter was killed in a car accident in [place], he admits to drinking tonight. Pt states he's suppose to go to [place] next Thursday for her funeral.

## 2013-05-04 NOTE — ED Notes (Signed)
Patient has been put in scrubs and he and his belongings have been wanded. His belongings are as follows:  Lexicographer Purple Clear lighter White Adidas hat White ear buds Power stick deodorant Mini axe deodorant x2 Black Samsung cell phone in Careers information officer case Blue electronic device with attached white ear buds Black samsung phone charger White pair of socks with gold toe, connected by safety pin [employer] x2 Red support out troops lanyard with attached silver key White polo shirt White wash cloth Small Jergen's ultra healing lotion bottle Mini germ-x moisturizing hand sanitizer BG body classic splash after shave [employer] mini body lotion [employer] Driver's license  Tall blue and white umbrella Biohazard bag:         1 dollar bill         8 quarters         6 nickels         1 dime         4 pennies Navy blue Nikes hat with red check Navy blue hoodie Yellow polo w/ tan collar Royal blue t shirt Blue jeans Black slip on shoes with white socks inside Green boxer briefs

## 2013-05-04 NOTE — ED Provider Notes (Signed)
CSN: [id number]     Arrival date & time 05/04/13  2026 History   This chart was scribed for non-physician practitioner Francoise CeoJacob Gray Savon Cobbs, PA-C, working with Toy BakerAnthony T Allen, MD, by Yevette EdwardsAngela Bracken, ED Scribe. This patient was seen in room [facility]TR4/[facility]PT4 and the patient's care was started at 8:53 PM. First MD Initiated Contact with Patient 05/04/13 2042     Chief Complaint  Patient presents with  . Medical Clearance    The history is provided by the patient. No language interpreter was used.   HPI Comments: [name] is a 48 y.o. male, with a h/o depression and alcohol withdrawals, who was brought to the Emergency Department by the police, voluntarily requesting medical clearance.  [name] reports he began drinking at 9 am this morning and has drunk nine 40 ounces as well as 4 jars of white liquor in the subsequent 11 hours. The pt states he was drinking today because he "does not want to live." He reports that "if I walk out of here tonight, I will kill myself. I will cut my throat. I am not playing. I am serious." He reports his 45eleven year old [place] in [place] few [place] ago, catalyzing his suicidal ideations. The pt states a [facility] wanting to "hang myself [because] my mother got shot and killed." He denies current homicidal ideations.  The pt voices he has not eaten in two days and that he has experienced sleep disturbances, stating instead of sleeping he has been "drinking and drinking and drinking." He also states "people in general are out to get me." He denies auditory hallucinations, though he reports he "hears my daughter's voice [saying] I love you, Daddy".  He denies visual hallucinations. He denies access to firearms. He has been prescribed medications, though he denies taking them currently; he cannot remember what medications he has been prescribed.  He denies known allergies to medications.  He smokes cigarettes. He denies drug usage.  PMH significant for HTN.   Past Medical History  Diagnosis Date  . Hypertension   . Alcoholism /alcohol abuse   . Anxiety and depression   . Tobacco abuse    Past Surgical History  Procedure Laterality Date  . Hernia repair     Family History  Problem Relation Age of Onset  . Heart attack Mother   . Heart attack Father   . Hypertension Mother   . Hypertension Father    History  Substance Use Topics  . Smoking status: Current Every Day Smoker -- 1.00 packs/day for 30 years    Types: Cigarettes  . Smokeless tobacco: Never Used  . Alcohol Use: 0.0 oz/week     Comment: Drinks every day, 9-11 40 oz beers.     Review of Systems  Constitutional: Negative for fever.  Respiratory: Positive for shortness of breath (Mild).   Cardiovascular: Negative for chest pain.  Gastrointestinal: Positive for nausea, vomiting and abdominal pain.       Acid reflux.   Psychiatric/Behavioral: Positive for suicidal ideas, hallucinations, sleep disturbance, dysphoric mood and agitation. The patient is nervous/anxious.   All other systems reviewed and are negative.    Allergies  Bee venom  Home Medications   Current Outpatient Rx  Name  Route  Sig  Dispense  Refill  . aspirin 325 MG tablet   Oral   Take 650 mg by mouth every 6 (six) hours as needed (pain).         .Marland Kitchen  diltiazem (CARDIZEM CD) 120 MG 24 hr capsule   Oral   Take 1 capsule (120 mg total) by mouth daily.   30 capsule   1    Triage Vitals: BP 132/73  Pulse 83  Temp(Src) 97.8 F (36.6 C) (Oral)  Resp 18  SpO2 98%  Physical Exam  Nursing note and vitals reviewed. Constitutional: He is oriented to person, place, and time. He appears well-developed and well-nourished. No distress.  HENT:  Head: Normocephalic and atraumatic.  Eyes: Conjunctivae and EOM are normal. Pupils are equal, round, and reactive to light. No scleral icterus.  Neck: Normal range of motion. Neck supple. No tracheal deviation present.   Cardiovascular: Normal rate, regular rhythm and normal heart sounds.  Exam reveals no gallop and no friction rub.   No murmur heard. Pulmonary/Chest: Effort normal and breath sounds normal. No respiratory distress. He has no wheezes. He has no rales.  Abdominal: Soft. Normal appearance and bowel sounds are normal. He exhibits no distension. There is tenderness. There is no rigidity, no guarding, no tenderness at McBurney's point and negative Murphy's sign.  Tenderness to epigastric and left upper quadrant.   Musculoskeletal: Normal range of motion.  Neurological: He is alert and oriented to person, place, and time. He has normal strength. No cranial nerve deficit or sensory deficit.  Skin: Skin is warm and dry. No rash noted.  Psychiatric: His mood appears anxious. He is agitated. Thought content is paranoid. Thought content is not delusional. He expresses suicidal ideation. He expresses no homicidal ideation. He expresses suicidal plans. He expresses no homicidal plans.  Patient tearful in room. Appears very emotionally distressed.     ED Course  Procedures (including critical care time)  DIAGNOSTIC STUDIES: Oxygen Saturation is 98% on room air, normal by my interpretation.    COORDINATION OF CARE:  9:06 PM- Discussed treatment plan with patient, and the patient agreed to the plan.   Labs Review Labs Reviewed  COMPREHENSIVE METABOLIC PANEL - Abnormal; Notable for the following:    Sodium 134 (*)    Glucose, Bld 62 (*)    All other components within normal limits  ETHANOL - Abnormal; Notable for the following:    Alcohol, Ethyl (B) 168 (*)    All other components within normal limits  CBC  LIPASE, BLOOD  URINE RAPID DRUG SCREEN (HOSP PERFORMED)   Imaging Review No results found.   EKG Interpretation None      MDM   Final diagnoses:  None    Patient afebrile with normal VS.  Filed Vitals:   05/04/13 2033  BP: 132/73  Pulse: 83  Temp: 97.8 F (36.6 C)  Resp: 18   Mild hyponatremia likely secondary to alcohol abuse.  Suspect patient mild abdominal pain related to his recent binge drinking behavior Hypoglycemia likely secondary to patient not eating for 2 days in combination with his alcohol abuse CBC WNL Lipase negative, doubt pancreatitis UDS pending Patient medically cleared by my examination. Consulted TTS. Patient currently awaiting further evaluation by TTS.   Meds given in ED:  Medications  alum & mag hydroxide-simeth (MAALOX/MYLANTA) 200-200-20 MG/5ML suspension 30 mL (not administered)  ondansetron (ZOFRAN) tablet 4 mg (not administered)  zolpidem (AMBIEN) tablet 5 mg (not administered)  acetaminophen (TYLENOL) tablet 650 mg (not administered)  ondansetron (ZOFRAN-ODT) disintegrating tablet 8 mg (8 mg Oral Given 05/04/13 2148)    New Prescriptions   No medications on file    I personally performed the services described in this documentation,  which was scribed in my presence. The recorded information has been reviewed and is accurate.     Rudene Anda, PA-C 05/04/13 [phone]

## 2013-05-05 ENCOUNTER — Encounter (HOSPITAL_COMMUNITY): Payer: Self-pay | Admitting: Registered Nurse

## 2013-05-05 ENCOUNTER — Emergency Department (HOSPITAL_COMMUNITY): Payer: Self-pay

## 2013-05-05 DIAGNOSIS — F329 Major depressive disorder, single episode, unspecified: Secondary | ICD-10-CM | POA: Diagnosis present

## 2013-05-05 DIAGNOSIS — F1020 Alcohol dependence, uncomplicated: Secondary | ICD-10-CM

## 2013-05-05 DIAGNOSIS — F10239 Alcohol dependence with withdrawal, unspecified: Secondary | ICD-10-CM

## 2013-05-05 DIAGNOSIS — F10939 Alcohol use, unspecified with withdrawal, unspecified: Secondary | ICD-10-CM

## 2013-05-05 DIAGNOSIS — I10 Essential (primary) hypertension: Secondary | ICD-10-CM

## 2013-05-05 DIAGNOSIS — I4891 Unspecified atrial fibrillation: Secondary | ICD-10-CM | POA: Diagnosis present

## 2013-05-05 DIAGNOSIS — R45851 Suicidal ideations: Secondary | ICD-10-CM

## 2013-05-05 DIAGNOSIS — I517 Cardiomegaly: Secondary | ICD-10-CM

## 2013-05-05 LAB — RAPID URINE DRUG SCREEN, HOSP PERFORMED
AMPHETAMINES: NOT DETECTED
Barbiturates: NOT DETECTED
Benzodiazepines: NOT DETECTED
Cocaine: NOT DETECTED
Opiates: NOT DETECTED
Tetrahydrocannabinol: NOT DETECTED

## 2013-05-05 LAB — CBC
HCT: 37.1 % — ABNORMAL LOW (ref 39.0–52.0)
HEMOGLOBIN: 12.4 g/dL — AB (ref 13.0–17.0)
MCH: 31.6 pg (ref 26.0–34.0)
MCHC: 33.4 g/dL (ref 30.0–36.0)
MCV: 94.6 fL (ref 78.0–100.0)
Platelets: 164 10*3/uL (ref 150–400)
RBC: 3.92 MIL/uL — ABNORMAL LOW (ref 4.22–5.81)
RDW: 13.6 % (ref 11.5–15.5)
WBC: 4.6 10*3/uL (ref 4.0–10.5)

## 2013-05-05 LAB — MRSA PCR SCREENING: MRSA by PCR: NEGATIVE

## 2013-05-05 LAB — TROPONIN I
Troponin I: <0.3 ng/mL (ref <0.30)
Troponin I: <0.3 ng/mL (ref <0.30)

## 2013-05-05 LAB — TSH
TSH: 0.524 u[IU]/mL (ref 0.350–4.500)
TSH: 0.532 u[IU]/mL (ref 0.350–4.500)

## 2013-05-05 LAB — CREATININE, SERUM: CREATININE: 0.87 mg/dL (ref 0.50–1.35)

## 2013-05-05 LAB — CBG MONITORING, ED: GLUCOSE-CAPILLARY: 120 mg/dL — AB (ref 70–99)

## 2013-05-05 LAB — MAGNESIUM: MAGNESIUM: 2.1 mg/dL (ref 1.5–2.5)

## 2013-05-05 MED ORDER — METOPROLOL TARTRATE 1 MG/ML IV SOLN
5.0000 mg | INTRAVENOUS | Status: DC | PRN
  Administered 2013-05-05 (×2): 5 mg via INTRAVENOUS
  Filled 2013-05-05: qty 5

## 2013-05-05 MED ORDER — ONDANSETRON HCL 4 MG/2ML IJ SOLN: 4.0000 mg | Freq: Four times a day (QID) | INTRAMUSCULAR | Status: DC | PRN

## 2013-05-05 MED ORDER — ASPIRIN 325 MG PO TABS: 325.0000 mg | ORAL_TABLET | Freq: Every day | ORAL | Status: DC

## 2013-05-05 MED ORDER — VITAMIN B-1 100 MG PO TABS
100.0000 mg | ORAL_TABLET | Freq: Every day | ORAL | Status: DC
  Administered 2013-05-05 – 2013-05-09 (×5): 100 mg via ORAL
  Filled 2013-05-05 (×5): qty 1

## 2013-05-05 MED ORDER — POTASSIUM CHLORIDE IN NACL 20-0.9 MEQ/L-% IV SOLN
INTRAVENOUS | Status: DC
  Administered 2013-05-05: 18:00:00 via INTRAVENOUS
  Filled 2013-05-05 (×5): qty 1000

## 2013-05-05 MED ORDER — LORAZEPAM 2 MG/ML IJ SOLN: 1.0000 mg | Freq: Four times a day (QID) | INTRAMUSCULAR | Status: AC | PRN

## 2013-05-05 MED ORDER — FOLIC ACID 1 MG PO TABS
1.0000 mg | ORAL_TABLET | Freq: Every day | ORAL | Status: DC
  Administered 2013-05-05 – 2013-05-09 (×5): 1 mg via ORAL
  Filled 2013-05-05 (×5): qty 1

## 2013-05-05 MED ORDER — SODIUM CHLORIDE 0.9 % IV BOLUS (SEPSIS): 500.0000 mL | INTRAVENOUS | Status: DC | PRN

## 2013-05-05 MED ORDER — LORAZEPAM 2 MG/ML IJ SOLN: 0.0000 mg | Freq: Two times a day (BID) | INTRAMUSCULAR | Status: AC

## 2013-05-05 MED ORDER — SODIUM CHLORIDE 0.9 % IV BOLUS (SEPSIS)
1000.0000 mL | Freq: Once | INTRAVENOUS | Status: AC
  Administered 2013-05-05: 1000 mL via INTRAVENOUS

## 2013-05-05 MED ORDER — ENOXAPARIN SODIUM 40 MG/0.4ML ~~LOC~~ SOLN: 40.0000 mg | SUBCUTANEOUS | Status: DC

## 2013-05-05 MED ORDER — DILTIAZEM HCL 100 MG IV SOLR
5.0000 mg/h | INTRAVENOUS | Status: DC
  Administered 2013-05-05 (×2): 10 mg/h via INTRAVENOUS
  Filled 2013-05-05: qty 100

## 2013-05-05 MED ORDER — DEXTROSE 5 % IV SOLN: 60.0000 mg/h | Freq: Once | INTRAVENOUS | Status: DC

## 2013-05-05 MED ORDER — SODIUM CHLORIDE 0.9 % IJ SOLN
3.0000 mL | Freq: Two times a day (BID) | INTRAMUSCULAR | Status: DC
  Administered 2013-05-05 – 2013-05-09 (×8): 3 mL via INTRAVENOUS

## 2013-05-05 MED ORDER — LORAZEPAM 1 MG PO TABS: 1.0000 mg | ORAL_TABLET | Freq: Four times a day (QID) | ORAL | Status: AC | PRN

## 2013-05-05 MED ORDER — ASPIRIN EC 81 MG PO TBEC
81.0000 mg | DELAYED_RELEASE_TABLET | Freq: Every day | ORAL | Status: DC
  Filled 2013-05-05: qty 1

## 2013-05-05 MED ORDER — AMIODARONE IV BOLUS ONLY 150 MG/100ML
150.0000 mg | Freq: Once | INTRAVENOUS | Status: AC
  Administered 2013-05-05: 150 mg via INTRAVENOUS

## 2013-05-05 MED ORDER — THIAMINE HCL 100 MG/ML IJ SOLN
100.0000 mg | Freq: Every day | INTRAMUSCULAR | Status: DC
  Filled 2013-05-05 (×5): qty 1

## 2013-05-05 MED ORDER — DILTIAZEM HCL 30 MG PO TABS
30.0000 mg | ORAL_TABLET | ORAL | Status: AC
  Administered 2013-05-05: 30 mg via ORAL
  Filled 2013-05-05: qty 1

## 2013-05-05 MED ORDER — METOPROLOL TARTRATE 25 MG PO TABS
25.0000 mg | ORAL_TABLET | Freq: Two times a day (BID) | ORAL | Status: DC
  Administered 2013-05-05: 25 mg via ORAL
  Filled 2013-05-05 (×5): qty 1

## 2013-05-05 MED ORDER — LORAZEPAM 2 MG/ML IJ SOLN: 0.0000 mg | Freq: Four times a day (QID) | INTRAMUSCULAR | Status: AC

## 2013-05-05 MED ORDER — ATROPINE SULFATE 0.1 MG/ML IJ SOLN
INTRAMUSCULAR | Status: AC
  Filled 2013-05-05: qty 10

## 2013-05-05 MED ORDER — AMIODARONE HCL IN DEXTROSE 360-4.14 MG/200ML-% IV SOLN
60.0000 mg/h | INTRAVENOUS | Status: DC
  Administered 2013-05-05 (×2): 60 mg/h via INTRAVENOUS
  Filled 2013-05-05 (×13): qty 200

## 2013-05-05 MED ORDER — ACETAMINOPHEN 325 MG PO TABS: 650.0000 mg | ORAL_TABLET | Freq: Four times a day (QID) | ORAL | Status: DC | PRN

## 2013-05-05 MED ORDER — APIXABAN 5 MG PO TABS
5.0000 mg | ORAL_TABLET | Freq: Two times a day (BID) | ORAL | Status: DC
  Administered 2013-05-05 – 2013-05-09 (×9): 5 mg via ORAL
  Filled 2013-05-05 (×10): qty 1

## 2013-05-05 MED ORDER — ACETAMINOPHEN 650 MG RE SUPP: 650.0000 mg | Freq: Four times a day (QID) | RECTAL | Status: DC | PRN

## 2013-05-05 MED ORDER — ONDANSETRON HCL 4 MG PO TABS: 4.0000 mg | ORAL_TABLET | Freq: Four times a day (QID) | ORAL | Status: DC | PRN

## 2013-05-05 MED ORDER — ASPIRIN 325 MG PO TABS
325.0000 mg | ORAL_TABLET | Freq: Once | ORAL | Status: AC
  Administered 2013-05-05: 325 mg via ORAL
  Filled 2013-05-05: qty 1

## 2013-05-05 MED ORDER — ADULT MULTIVITAMIN W/MINERALS CH
1.0000 | ORAL_TABLET | Freq: Every day | ORAL | Status: DC
  Administered 2013-05-05 – 2013-05-09 (×5): 1 via ORAL
  Filled 2013-05-05 (×5): qty 1

## 2013-05-05 MED ORDER — LORAZEPAM 2 MG/ML IJ SOLN
1.0000 mg | Freq: Once | INTRAMUSCULAR | Status: AC
  Administered 2013-05-05: 1 mg via INTRAVENOUS
  Filled 2013-05-05: qty 1

## 2013-05-05 NOTE — ED Notes (Signed)
I have just phoned report to Misty Stanley, RN in ICU and will transport shortly with con't. ecg monitoring.  Sitter remains with im.

## 2013-05-05 NOTE — ED Notes (Signed)
Dr. Laurian Brim instructs me to give Metoprolol IV, which I do.

## 2013-05-05 NOTE — ED Notes (Addendum)
Pt is awake and alert, patient is reporting SI. Denies H/A, dizziness or CP.  EKG order moved to medical side to be cleared for [facility]. Report and belongings  given to RN Merle.

## 2013-05-05 NOTE — ED Provider Notes (Signed)
Medical screening examination/treatment/procedure(s) were conducted as a shared visit with non-physician practitioner(s) and myself.  I personally evaluated the patient during the encounter.   EKG Interpretation None       Pt comes in with cc of detox. Hx of afib, on cardiazem.  Pt was moved to the psych area, where an ekg was completed, and he was noted to be in afib with RVR. I had patient moved to the main ED. He had no chest pain, palpitations, dib.   Date: 05/05/2013  Rate: 125  Rhythm: atrial fibrillation  QRS Axis: left  Intervals: normal  ST/T Wave abnormalities: nonspecific T wave changes  Conduction Disutrbances:none  Narrative Interpretation:   Old EKG Reviewed: changes noted - afiv with RVR  Pt's BP was in the 90s/60s. We gave an oral cardiazem 30 mg - with no response.  Subsequently, his BP dropped to 80s/50s with HR in the 150s. i suspected that some of his tachycardia was related to the alcohol withdrawals. Pt was still alert and asymptomatic, so we called Cardiology, rather than electric cardioversion, (as i still thought that patient's underlying etiology was alcohol withdrawal), so Dr. Sharyn Lull was paged - who recommended amiodarone and cardiovert if needed.  Pt responded to amio bolus quite well. HR improved to 120s and BP went up to 110/70.  Pt then however, had his HR increase again to 150s. Dr. Sharyn Lull to see patient, Hospitalist to admit. Still stable mentation, normal mentation, and chest pain free.   CRITICAL CARE Performed by: Derwood Kaplan   Total critical care time: 75 minutes  Critical care time was exclusive of separately billable procedures and treating other patients.  Critical care was necessary to treat or prevent imminent or life-threatening deterioration.  Critical care was time spent personally by me on the following activities: development of treatment plan with patient and/or surrogate as well as nursing, discussions with  consultants, evaluation of patient's response to treatment, examination of patient, obtaining history from patient or surrogate, ordering and performing treatments and interventions, ordering and review of laboratory studies, ordering and review of radiographic studies, pulse oximetry and re-evaluation of patient's condition.      Derwood Kaplan, MD 05/05/13 [phone]

## 2013-05-05 NOTE — ED Notes (Signed)
While Dr. Sharyn Lull is here, pt. Abruptly decreases heart rate to irregular and in the 80's.  Additional EKG transmitted.  It remains an irregular, supraventricular rhythm a-fib and at times it appears to be a-flutter.  Pt. Remains alert, oriented and in no  Distress.  He denies pain.  NOte:  This occurred before IV cardizem and after two doses of IV metoprolol.

## 2013-05-05 NOTE — BH Assessment (Signed)
Assessment completed. Consulted with Alberteen Sam, NP. Pt will need an EKG complete before the evaluation for admission can be completed.

## 2013-05-05 NOTE — H&P (Addendum)
[facility] History and Physical  [name] YEM:[id number] DOB: [birth year] DOA: 05/04/2013  Referring physician:  PCP: No PCP Per Patient  Specialists:   Chief Complaint: SI  HPI: [name] is a 48 y.o. male with PMH of A FIB, HTN, tobacco use, alcoholism, depression brought to the Emergency Department by the police, voluntarily requesting medical clearance. He reported drinking alcohol daily after and persistent suicidal ideation after loosing his 46 year old daughter who died in [place] few days ago; The pt states he was drinking today because he "does not want to live." He reports that "if I walk out of here tonight, I will kill myself. He ahd mild non exertional chest pain, SOB, and vomited x 2; denies fever, cough;  -in ED he is found to be in A fib RVR, which did not improve after IV BB, IV amiodarone   Review of Systems: The patient denies anorexia, fever, weight loss,, vision loss, decreased hearing, hoarseness, chest pain, syncope, dyspnea on exertion, peripheral edema, balance deficits, hemoptysis, abdominal pain, melena, hematochezia, severe indigestion/heartburn, hematuria, incontinence, genital sores, muscle weakness, suspicious skin lesions, transient blindness, difficulty walking, depression, unusual weight change, abnormal bleeding, enlarged lymph nodes, angioedema, and breast masses.   Past Medical History  Diagnosis Date  . Hypertension   . Alcoholism /alcohol abuse   . Anxiety and depression   . Tobacco abuse    Past Surgical History  Procedure Laterality Date  . Hernia repair     Social History:  reports that he has been smoking Cigarettes.  He has a 30 pack-year smoking history. He has never used smokeless tobacco. He reports that he drinks alcohol. He reports that he does not use illicit drugs. Home;  where does patient live--home, ALF, SNF? and with whom if at home? Yes;  Can patient participate in ADLs?  Allergies  Allergen Reactions  . Bee  Venom Anaphylaxis    Uses epi pen-- last used 08/23/11    Family History  Problem Relation Age of Onset  . Heart attack Mother   . Heart attack Father   . Hypertension Mother   . Hypertension Father     (be sure to complete)  Prior to Admission medications   Medication Sig Start Date End Date Taking? Authorizing Provider  aspirin 325 MG tablet Take 650 mg by mouth every 6 (six) hours as needed (pain).   Yes Historical Provider, MD  diltiazem (CARDIZEM CD) 120 MG 24 hr capsule Take 1 capsule (120 mg total) by mouth daily. 01/25/13  Yes Nanine Means, NP   Physical Exam: Filed Vitals:   05/05/13 0830  BP: 93/74  Pulse:   Temp:   Resp:      General:  alert  Eyes: eom-i  ENT: no oral ulcers   Neck: supple   Cardiovascular: s1,s2 irregular  Respiratory: CTA BL  Abdomen: soft, nt,nd   Skin: some echymosis   Musculoskeletal: no LE edema  Psychiatric: as above hallucinations   Neurologic: CN 2-12 intact, motor 5/5 BL  Labs on Admission:  Basic Metabolic Panel:  Recent Labs Lab 05/04/13 2051 05/05/13 0027  NA 134*  --   K 4.0  --   CL 97  --   CO2 19  --   GLUCOSE 62*  --   BUN 9  --   CREATININE 0.83  --   CALCIUM 9.3  --   MG  --  2.1   Liver Function Tests:  Recent Labs Lab 05/04/13 2051  AST 23  ALT 21  ALKPHOS 68  BILITOT 0.5  PROT 7.7  ALBUMIN 4.4    Recent Labs Lab 05/04/13 2051  LIPASE 29   No results found for this basename: AMMONIA,  in the last 168 hours CBC:  Recent Labs Lab 05/04/13 2051  WBC 5.0  HGB 13.9  HCT 39.2  MCV 91.8  PLT 207   Cardiac Enzymes:  Recent Labs Lab 05/05/13 0027 05/05/13 0450  TROPONINI <0.30 <0.30    BNP (last 3 results) No results found for this basename: PROBNP,  in the last 8760 hours CBG:  Recent Labs Lab 05/05/13 0005  GLUCAP 120*    Radiological Exams on Admission: Dg Chest Portable 1 View  05/05/2013   CLINICAL DATA:  Medical clearance; shortness of breath and  right-sided abdominal pain. History of smoking.  EXAM: PORTABLE CHEST - 1 VIEW  COMPARISON:  Chest radiograph performed 01/15/2013  FINDINGS: The lungs are well-aerated. Vascular congestion is noted. Mildly increased interstitial markings could reflect minimal interstitial edema. There is no evidence of pleural effusion or pneumothorax.  The cardiomediastinal silhouette is borderline enlarged. No acute osseous abnormalities are seen.  IMPRESSION: Vascular congestion and borderline cardiomegaly. Mildly increased interstitial markings could reflect minimal interstitial edema.   Electronically Signed   By: Roanna RaiderJeffery  [name] M.D.   On: 05/05/2013 05:32    EKG: Independently reviewed. A fib RVR, diffuse ST changes   Assessment/Plan Principal Problem:   MDD (major depressive disorder) Active Problems:   Alcoholism /alcohol abuse   Tobacco abuse   Anxiety and depression   Hypertension   Alcohol dependence  48 y.o. male with PMH of A FIB, HTN, tobacco use, alcoholism, depression brought to the Emergency Department by the police, voluntarily requesting medical clearance found in A FIB RVR, SI  1. A FIB RVR; with intermittent hypotension with tachycardia; diffuse ST changes but trop neg; echo (2014): LVEF 55%, o regional wall motion abnormalities -HR not improved on IV BB push, IV amiodarone in ED; cont IV amiodarone, start IV Cardizem infusion as BP tolerates; cont IVF; ASA; ECG diffuse ST changes d/w cardiology; Dr. Sharyn LullHarwani performed carotid sinus massage HR beter controlled; defer further management to cardiology, appreciate the input   2. Depression; SI, patient need inpatient psychiatry treatment -consulted psychiatry, bedside sitter      3. Alcoholism, h/o withdrawals; denies seizures;  -monitor on CIWA; need detox   4. HTN currently intermittent hypotension; monitor on Cardizem    Cardiology;  if consultant consulted, please document name and whether formally or informally consulted  Code  Status: full (must indicate code status--if unknown or must be presumed, indicate so) Family Communication: d/w patient  (indicate person spoken with, if applicable, with phone number if by telephone) Disposition Plan: pend clinical improvement; inpatient psychiatry  (indicate anticipated LOS)  Time spent: >35 minutes   Esperanza SheetsBURIEV, [name] N [facility] Pager [phone]  If 7PM-7AM, please contact night-coverage [email or web address] Password [email or web address] 05/05/2013, 8:42 AM

## 2013-05-05 NOTE — Progress Notes (Signed)
  Echocardiogram 2D Echocardiogram has been performed.  Nestor Ramp M 05/05/2013, 3:22 PM

## 2013-05-05 NOTE — ED Notes (Signed)
As I have observed him since ~0720 he has been in a multifocal svt.  Pt. Is easily aroused and has no requests at this time.  He is oriented to all but date/time with clear speech.  Dr. Laurian Brim has informed me to hold the metoprolol until Dr. Sharyn Lull sees him as he feels he is too hypotensive at times to safely receive the beta blocker.  Sitter at bedside.
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
## 2013-05-05 NOTE — Progress Notes (Signed)
ANTICOAGULATION CONSULT NOTE - Initial Consult  Pharmacy Consult for Apixaban (Eliquis) Indication: atrial fibrillation  Allergies  Allergen Reactions  . Bee Venom Anaphylaxis    Uses epi pen-- last used 08/23/11    Patient Measurements:   Last documented weight 73 kg (01/19/13)  Vital Signs: Temp: 98.4 F (36.9 C) (04/05 0504) Temp src: Oral (04/05 0504) BP: 93/74 mmHg (04/05 0830) Pulse Rate: 155 (04/05 0730)  Labs:  Recent Labs  05/04/13 2051 05/05/13 0027 05/05/13 0450  HGB 13.9  --   --   HCT 39.2  --   --   PLT 207  --   --   CREATININE 0.83  --   --   TROPONINI  --  <0.30 <0.30    The CrCl is unknown because both a height and weight (above a minimum accepted value) are required for this calculation.  Medical History: Past Medical History  Diagnosis Date  . Hypertension   . Alcoholism /alcohol abuse   . Anxiety and depression   . Tobacco abuse     Medications:  Scheduled:  . metoprolol tartrate  25 mg Oral BID   Infusions:  . amiodarone (NEXTERONE PREMIX) 360 mg/200 mL dextrose 60 mg/hr (05/05/13 0537)  . diltiazem (CARDIZEM) infusion 10 mg/hr (05/05/13 0915)   PRN: acetaminophen, alum & mag hydroxide-simeth, metoprolol, ondansetron, zolpidem  Assessment: 48 yoM [facility] ED by police requesting medical clearance for suicidal ideation and alcohol abuse.  He is found to be in atrial fibrillation, flutter, with RVR.  Pharmacy is consulted to dose apixaban.  SCr 0.83, CrCl > 100 ml/min  CBC: Hgb 13.9, Plt 207  Drug interactions: Amiodarone (P-glycoprotein inhibitor) and Diltiazem (moderate CYP3A4 inhibitor) may increase serum concentrations of apixaban.  Dosage adjustment not required, monitor therapy.  Goal of Therapy:  Monitor platelets by anticoagulation protocol: Yes   Plan:   Apixaban 5mg  PO BID  Pharmacy will continue to follow.  Lynann Beaver PharmD, BCPS Pager [phone] 05/05/2013 9:37 AM

## 2013-05-05 NOTE — ED Notes (Signed)
He has eaten breakfast without difficulty and remains in no distress.  He again went into an rvr response; so IV cardizem therapy initiated.  As I write this he varies between s-fib and a-flutter--current heart rate ~95. His skin is normal, warm and dry and he is breathing normally.

## 2013-05-05 NOTE — Progress Notes (Signed)
Dr. Sharyn Lull called to update on pts heart rate. Pt is having increased chest pressure as well as a head ache and some shortness of breath. Pts heart rate remains in the high 30s to lower 40s. An EKG was done and it was read by Dr. Sharyn Lull while on phone with RN. MD ordered another 500cc bolus and to keep monitoring pt.

## 2013-05-05 NOTE — Progress Notes (Signed)
Dr. Sharyn Lull called to update on pts heart rate. Pt had an episode of bradycardia in the 30s in the ED prior to arrival on floor. Pt is currently have another episode. Pt is currently in the 30s for heart rate. Pt currently has amiodarone at 60mg  running and the cardizem drip has been off for about an hour. Verbal order given to stop amiodarone drip and to give a 500cc bolus of normal saline. Will continue to monitor.

## 2013-05-05 NOTE — Consult Note (Signed)
Reason for Consult: Atrial fibrillation/ flutter with RVR Referring Physician: [facility] hospitalist  [name] is an 48 y.o. male.  HPI: Patient is 48 year old male with past medical history significant for hypertension, EtOH abuse  tobacco abuse depression anxiety came to the ER due to an acute depression and suicidal ideation patient states he just came to know recently that his daughter has a past away due to accidental death in [place] and has been drinking heavily. Cardiologic consultation is called as patient was noted to be in A. fib flutter with RVR. Patient denies any palpitations lightheadedness or syncope. Denies any chest pain nausea vomiting diaphoresis. Denies any history of PND orthopnea leg swelling. Denies any thyroid problems in the past or history of rheumatic fever. Patient initially received by mouth Cardizem without effect in the ED and then was started on IV amiodarone without much improvement in heart rate and then followed by received IV Lopressor x2 and had carotid sinus massage with control of heart rate in 80s to 90s.  Past Medical History  Diagnosis Date  . Hypertension   . Alcoholism /alcohol abuse   . Anxiety and depression   . Tobacco abuse     Past Surgical History  Procedure Laterality Date  . Hernia repair      Family History  Problem Relation Age of Onset  . Heart attack Mother   . Heart attack Father   . Hypertension Mother   . Hypertension Father     Social History:  reports that he has been smoking Cigarettes.  He has a 30 pack-year smoking history. He has never used smokeless tobacco. He reports that he drinks alcohol. He reports that he does not use illicit drugs.  Allergies:  Allergies  Allergen Reactions  . Bee Venom Anaphylaxis    Uses epi pen-- last used 08/23/11    Medications: I have reviewed the patient's current medications.  Results for orders placed during the hospital encounter of 05/04/13 (from the past 48 hour(s))  CBC      Status: None   Collection Time    05/04/13  8:51 PM      Result Value Ref Range   WBC 5.0  4.0 - 10.5 K/uL   RBC 4.27  4.22 - 5.81 MIL/uL   Hemoglobin 13.9  13.0 - 17.0 g/dL   HCT 39.2  39.0 - 52.0 %   MCV 91.8  78.0 - 100.0 fL   MCH 32.6  26.0 - 34.0 pg   MCHC 35.5  30.0 - 36.0 g/dL   RDW 13.1  11.5 - 15.5 %   Platelets 207  150 - 400 K/uL  COMPREHENSIVE METABOLIC PANEL     Status: Abnormal   Collection Time    05/04/13  8:51 PM      Result Value Ref Range   Sodium 134 (*) 137 - 147 mEq/L   Potassium 4.0  3.7 - 5.3 mEq/L   Chloride 97  96 - 112 mEq/L   CO2 19  19 - 32 mEq/L   Glucose, Bld 62 (*) 70 - 99 mg/dL   BUN 9  6 - 23 mg/dL   Creatinine, Ser 0.83  0.50 - 1.35 mg/dL   Calcium 9.3  8.4 - 10.5 mg/dL   Total Protein 7.7  6.0 - 8.3 g/dL   Albumin 4.4  3.5 - 5.2 g/dL   AST 23  0 - 37 U/L   ALT 21  0 - 53 U/L   Alkaline Phosphatase 68  39 -  117 U/L   Total Bilirubin 0.5  0.3 - 1.2 mg/dL   GFR calc non Af Amer >90  >90 mL/min   GFR calc Af Amer >90  >90 mL/min   Comment: (NOTE)     The eGFR has been calculated using the CKD EPI equation.     This calculation has not been validated in all clinical situations.     eGFR's persistently <90 mL/min signify possible Chronic Kidney     Disease.  ETHANOL     Status: Abnormal   Collection Time    05/04/13  8:51 PM      Result Value Ref Range   Alcohol, Ethyl (B) 168 (*) 0 - 11 mg/dL   Comment:            LOWEST DETECTABLE LIMIT FOR     SERUM ALCOHOL IS 11 mg/dL     FOR MEDICAL PURPOSES ONLY  LIPASE, BLOOD     Status: None   Collection Time    05/04/13  8:51 PM      Result Value Ref Range   Lipase 29  11 - 59 U/L  CBG MONITORING, ED     Status: Abnormal   Collection Time    05/05/13 12:05 AM      Result Value Ref Range   Glucose-Capillary 120 (*) 70 - 99 mg/dL  MAGNESIUM     Status: None   Collection Time    05/05/13 12:27 AM      Result Value Ref Range   Magnesium 2.1  1.5 - 2.5 mg/dL  TROPONIN I     Status: None    Collection Time    05/05/13 12:27 AM      Result Value Ref Range   Troponin I <0.30  <0.30 ng/mL   Comment:            Due to the release kinetics of cTnI,     a negative result within the first hours     of the onset of symptoms does not rule out     myocardial infarction with certainty.     If myocardial infarction is still suspected,     repeat the test at appropriate intervals.  TSH     Status: None   Collection Time    05/05/13 12:27 AM      Result Value Ref Range   TSH 0.524  0.350 - 4.500 uIU/mL   Comment: Please note change in reference range.     Performed at [facility] (HOSP PERFORMED)     Status: None   Collection Time    05/05/13 12:45 AM      Result Value Ref Range   Opiates NONE DETECTED  NONE DETECTED   Cocaine NONE DETECTED  NONE DETECTED   Benzodiazepines NONE DETECTED  NONE DETECTED   Amphetamines NONE DETECTED  NONE DETECTED   Tetrahydrocannabinol NONE DETECTED  NONE DETECTED   Barbiturates NONE DETECTED  NONE DETECTED   Comment:            DRUG SCREEN FOR MEDICAL PURPOSES     ONLY.  IF CONFIRMATION IS NEEDED     FOR ANY PURPOSE, NOTIFY LAB     WITHIN 5 DAYS.                LOWEST DETECTABLE LIMITS     FOR URINE DRUG SCREEN     Drug Class       Cutoff (ng/mL)  Amphetamine      1000     Barbiturate      200     Benzodiazepine   268     Tricyclics       341     Opiates          300     Cocaine          300     THC              50  TROPONIN I     Status: None   Collection Time    05/05/13  4:50 AM      Result Value Ref Range   Troponin I <0.30  <0.30 ng/mL   Comment:            Due to the release kinetics of cTnI,     a negative result within the first hours     of the onset of symptoms does not rule out     myocardial infarction with certainty.     If myocardial infarction is still suspected,     repeat the test at appropriate intervals.    Dg Chest Portable 1 View  05/05/2013   CLINICAL DATA:  Medical  clearance; shortness of breath and right-sided abdominal pain. History of smoking.  EXAM: PORTABLE CHEST - 1 VIEW  COMPARISON:  Chest radiograph performed 01/15/2013  FINDINGS: The lungs are well-aerated. Vascular congestion is noted. Mildly increased interstitial markings could reflect minimal interstitial edema. There is no evidence of pleural effusion or pneumothorax.  The cardiomediastinal silhouette is borderline enlarged. No acute osseous abnormalities are seen.  IMPRESSION: Vascular congestion and borderline cardiomegaly. Mildly increased interstitial markings could reflect minimal interstitial edema.   Electronically Signed   By: Garald Balding M.D.   On: 05/05/2013 05:32    Review of Systems  Constitutional: Negative for fever, chills and weight loss.  Eyes: Negative for blurred vision and double vision.  Respiratory: Negative for cough, hemoptysis and sputum production.   Cardiovascular: Negative for chest pain, palpitations, orthopnea and claudication.  Gastrointestinal: Negative for nausea, vomiting and abdominal pain.  Genitourinary: Negative for dysuria and urgency.  Neurological: Negative for dizziness and headaches.   Blood pressure 93/74, pulse 155, temperature 98.4 F (36.9 C), temperature source Oral, resp. rate 18, SpO2 95.00%. Physical Exam  Constitutional: He is oriented to person, place, and time.  HENT:  Head: Normocephalic and atraumatic.  Eyes: Conjunctivae are normal. Pupils are equal, round, and reactive to light. Left eye exhibits no discharge. No scleral icterus.  Neck: Normal range of motion. Neck supple. No JVD present. No tracheal deviation present. No thyromegaly present.  Cardiovascular:  Irregularly irregular S1 and S2 soft there is soft systolic murmur no S3 gallop  Respiratory: Effort normal and breath sounds normal. No respiratory distress. He has no wheezes. He has no rales.  GI: Soft. Bowel sounds are normal. He exhibits no distension. There is no  tenderness. There is no rebound.  Musculoskeletal: He exhibits no edema.  Neurological: He is alert and oriented to person, place, and time.    Assessment/Plan: New-onset A. fib flutter with RVR Hypertension EtOH abuse Tobacco abuse Suicidal ideation Depression Plan Continue present management Start Lopressor as per orders Start Eliquis as per orders  Check 2-D echo Check TSH Psych consult Smoking and EtOH cessation consult Christepher Melchior N 05/05/2013, 9:01 AM

## 2013-05-06 NOTE — Progress Notes (Signed)
Nutrition Brief Note  Patient identified on the Malnutrition Screening Tool (MST) Report  Wt Readings from Last 4 Encounters:  05/06/13 196 lb 3.4 oz (89 kg)  01/19/13 161 lb (73.029 kg)  01/18/13 162 lb (73.483 kg)  01/18/13 177 lb 0.5 oz (80.3 kg)     Body mass index is 32.65 kg/(m^2). Patient meets criteria for class I obesity based on current BMI.   Current diet order is regular, patient is consuming approximately 100% of meals at this time. Labs and medications reviewed. Admitted with suicidal ideation after loss of 42 year old daughter a few days ago, killed in a car accident. Met with pt who reports drinking a lot for the past 3 months, recently d/c from [facility]. States his appetite has been poor during this time due to drinking and did not eat anything for 2 days PTA. Currently with excellent appetite and 100% meal intake. Requests help for his drinking problem, Education officer, museum following. Getting thiamine, folic acid, and multivitamin. Eating well with no nutritional concerns at this time. Ordered spiritual care consult to help cope with pt's loss.   No nutrition interventions warranted at this time. If nutrition issues arise, please consult RD.   Mikey College MS, [place], [place] Pager [phone] After Hours Pager
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
## 2013-05-06 NOTE — Discharge Instructions (Addendum)
You have an appointment with Dr. Sharyn Lull on 05/15/2013 at 3:15 pm. It is very important that you do not miss this appointment.    Information on my medicine - ELIQUIS (apixaban)  This medication education was reviewed with me or my healthcare representative as part of my discharge preparation.  The pharmacist that spoke with me during my hospital stay was:  Clance Boll, [facility]  Why was Eliquis prescribed for you? Eliquis was prescribed for you to reduce the risk of a blood clot forming that can cause a stroke if you have a medical condition called atrial fibrillation (a type of irregular heartbeat).  What do You need to know about Eliquis ? Take your Eliquis TWICE DAILY - one tablet in the morning and one tablet in the evening with or without food. If you have difficulty swallowing the tablet whole please discuss with your pharmacist how to take the medication safely.  Take Eliquis exactly as prescribed by your doctor and DO NOT stop taking Eliquis without talking to the doctor who prescribed the medication.  Stopping may increase your risk of developing a stroke.  Refill your prescription before you run out.  After discharge, you should have regular check-up appointments with your healthcare provider that is prescribing your Eliquis.  In the future your dose may need to be changed if your kidney function or weight changes by a significant amount or as you get older.  What do you do if you miss a dose? If you miss a dose, take it as soon as you remember on the same day and resume taking twice daily.  Do not take more than one dose of ELIQUIS at the same time to make up a missed dose.  Important Safety Information A possible side effect of Eliquis is bleeding. You should call your healthcare provider right away if you experience any of the following:   Bleeding from an injury or your nose that does not stop.   Unusual colored urine (red or dark brown) or unusual colored stools (red or  black).   Unusual bruising for unknown reasons.   A serious fall or if you hit your head (even if there is no bleeding).  Some medicines may interact with Eliquis and might increase your risk of bleeding or clotting while on Eliquis. To help avoid this, consult your healthcare provider or pharmacist prior to using any new prescription or non-prescription medications, including herbals, vitamins, non-steroidal anti-inflammatory drugs (NSAIDs) and supplements.  This website has more information on Eliquis (apixaban): [email or web address].com.cy.

## 2013-05-06 NOTE — Progress Notes (Signed)
Chaplain notified of spiritual care consult.  RN spoke with patient at length concerning loss of his daughter and his ex-wife's mental status.  Comfort and support given. [name]

## 2013-05-06 NOTE — Care Management Note (Addendum)
    Page 1 of 1   05/08/2013     3:08:38 PM   CARE MANAGEMENT NOTE 05/08/2013  Patient:  Robert, [name]   Account Number:  [id number]  Date Initiated:  05/06/2013  Documentation initiated by:  Lanier Clam  Subjective/Objective Assessment:   48 Y/O M ADMITTED W/AFIB W/RVR.TI:[id number],QP.     Action/Plan:   FROM HOME.   Anticipated DC Date:  05/08/2013   Anticipated DC Plan:  PSYCHIATRIC HOSPITAL      DC Planning Services  CM consult      Choice offered to / List presented to:             Status of service:  In process, will continue to follow Medicare Important Message given?   (If response is "NO", the following Medicare IM given date fields will be blank) Date Medicare IM given:   Date Additional Medicare IM given:    Discharge Disposition:    Per UR Regulation:  Reviewed for med. necessity/level of care/duration of stay  If discussed at Long Length of Stay Meetings, dates discussed:    Comments:  05/08/13 Robert Liese RN,BSN NCM [phone] CARDIO CONS-AFIB/BRADY.D/C PLAN INPT PSYCH.  05/06/13 Robert Riviere RN,BSN NCM [phone] TRANSFER FROM 5E.PSYCH/PSYCH CSW FOLLOWING.D/C PLAN INPT PSYCH.

## 2013-05-06 NOTE — Progress Notes (Signed)
[facility] PROGRESS NOTE  Castle Dougall DUK:[id number] DOB: [birth year] DOA: 05/04/2013 PCP: No PCP Per Patient  Assessment/Plan:  Principal Problem:  MDD (major depressive disorder)  Active Problems:  Alcoholism /alcohol abuse  Tobacco abuse  Anxiety and depression  Hypertension  Alcohol dependence   48 y.o. male with PMH of A FIB, HTN, tobacco use, alcoholism, depression brought to the Emergency Department by the police, voluntarily requesting medical clearance found in A FIB RVR, SI   1. A FIB RVR; HR improved post BB, Cardizem, and Dr. Sharyn Lull performed carotid sinus massage; transient bradycardia resolving; currently in a fib brady  -echo : LVEF 50-55%, Mild pulmonary hypertension  -started on BB PO, apixaban per cardiology;  defer further management to cardiology, appreciate the input   2. Depression; SI, patient need inpatient psychiatry treatment  -consulted psychiatry, bedside sitter   3. Alcoholism, h/o withdrawals; denies seizures;  -monitor on CIWA; need detox   4. HTN intermittent hypotension, resolving   TF to [facility] 4/6  Cardiology; if consultant consulted, please document name and whether formally or informally consulted   Code Status: full (must indicate code status--if unknown or must be presumed, indicate so)  Family Communication: d/w patient (indicate person spoken with, if applicable, with phone number if by telephone)  Disposition Plan: pend clinical improvement; inpatient psychiatry (indicate anticipated LOS)   Consultants:  cardiology   Procedures:  None   Antibiotics:  None  (indicate start date, and stop date if known)  HPI/Subjective: alert  Objective: Filed Vitals:   05/06/13 0600  BP: 114/73  Pulse: 48  Temp:   Resp: 18    Intake/Output Summary (Last 24 hours) at 05/06/13 0744 Last data filed at 05/06/13 0616  Gross per 24 hour  Intake 2221.75 ml  Output   1375 ml  Net 846.75 ml   Filed Weights   05/05/13 0504  05/06/13 0400  Weight: 86.4 kg (190 lb 7.6 oz) 89 kg (196 lb 3.4 oz)    Exam:   General:  alert  Cardiovascular: s1,s2 rrr  Respiratory: CTA BL  Abdomen: soft,nt,nd   Musculoskeletal: no LE edema   Data Reviewed: Basic Metabolic Panel:  Recent Labs Lab 05/04/13 2051 05/05/13 0027 05/05/13 1151  NA 134*  --   --   K 4.0  --   --   CL 97  --   --   CO2 19  --   --   GLUCOSE 62*  --   --   BUN 9  --   --   CREATININE 0.83  --  0.87  CALCIUM 9.3  --   --   MG  --  2.1  --    Liver Function Tests:  Recent Labs Lab 05/04/13 2051  AST 23  ALT 21  ALKPHOS 68  BILITOT 0.5  PROT 7.7  ALBUMIN 4.4    Recent Labs Lab 05/04/13 2051  LIPASE 29   No results found for this basename: AMMONIA,  in the last 168 hours CBC:  Recent Labs Lab 05/04/13 2051 05/05/13 1151  WBC 5.0 4.6  HGB 13.9 12.4*  HCT 39.2 37.1*  MCV 91.8 94.6  PLT 207 164   Cardiac Enzymes:  Recent Labs Lab 05/05/13 0027 05/05/13 0450  TROPONINI <0.30 <0.30   BNP (last 3 results) No results found for this basename: PROBNP,  in the last 8760 hours CBG:  Recent Labs Lab 05/05/13 0005  GLUCAP 120*    Recent Results (from the past 240 hour(s))  MRSA PCR SCREENING     Status: None   Collection Time    05/05/13 10:20 AM      Result Value Ref Range Status   MRSA by PCR NEGATIVE  NEGATIVE Final   Comment:            The GeneXpert MRSA Assay (FDA     approved for NASAL specimens     only), is one component of a     comprehensive MRSA colonization     surveillance program. It is not     intended to diagnose MRSA     infection nor to guide or     monitor treatment for     MRSA infections.     Studies: Dg Chest Portable 1 View  05/05/2013   CLINICAL DATA:  Medical clearance; shortness of breath and right-sided abdominal pain. History of smoking.  EXAM: PORTABLE CHEST - 1 VIEW  COMPARISON:  Chest radiograph performed 01/15/2013  FINDINGS: The lungs are well-aerated. Vascular  congestion is noted. Mildly increased interstitial markings could reflect minimal interstitial edema. There is no evidence of pleural effusion or pneumothorax.  The cardiomediastinal silhouette is borderline enlarged. No acute osseous abnormalities are seen.  IMPRESSION: Vascular congestion and borderline cardiomegaly. Mildly increased interstitial markings could reflect minimal interstitial edema.   Electronically Signed   By: Roanna RaiderJeffery  [name] M.D.   On: 05/05/2013 05:32    Scheduled Meds: . apixaban  5 mg Oral BID  . aspirin EC  81 mg Oral Daily  . folic acid  1 mg Oral Daily  . LORazepam  0-4 mg Intravenous Q6H   Followed by  . [START ON 05/07/2013] LORazepam  0-4 mg Intravenous Q12H  . metoprolol tartrate  25 mg Oral BID  . multivitamin with minerals  1 tablet Oral Daily  . sodium chloride  3 mL Intravenous Q12H  . thiamine  100 mg Oral Daily   Or  . thiamine  100 mg Intravenous Daily   Continuous Infusions: . 0.9 % NaCl with KCl 20 mEq / L 75 mL/hr at 05/05/13 2000  . amiodarone (NEXTERONE PREMIX) 360 mg/200 mL dextrose 60 mg/hr (05/05/13 0928)    Principal Problem:   MDD (major depressive disorder) Active Problems:   Alcoholism /alcohol abuse   Tobacco abuse   Anxiety and depression   Hypertension   Alcohol dependence   Atrial fibrillation with RVR    Time spent: >35 minutes     Esperanza SheetsBURIEV, Karime Scheuermann N  [facility] Pager [phone]. If 7PM-7AM, please contact night-coverage at [email or web address], password [email or web address] 05/06/2013, 7:44 AM  LOS: 2 days

## 2013-05-06 NOTE — Progress Notes (Signed)
Subjective:  Patient denies any chest pain or shortness of breath.  Denies any palpitations.  Converted back to sinus rhythm and remains in sinus rhythm.  Objective:  Vital Signs in the last 24 hours: Temp:  [97.6 F (36.4 C)-98.7 F (37.1 C)] 98 F (36.7 C) (04/06 1126) Pulse Rate:  [47-58] 53 (04/06 1126) Resp:  [14-26] 20 (04/06 1126) BP: (97-126)/(63-93) 121/93 mmHg (04/06 1126) SpO2:  [92 %-100 %] 99 % (04/06 1126) Weight:  [89 kg (196 lb 3.4 oz)] 89 kg (196 lb 3.4 oz) (04/06 0400)  Intake/Output from previous day: 04/05 0701 - 04/06 0700 In: 2461.8 [P.O.:1580; I.V.:881.8] Out: 1375 [Urine:1375] Intake/Output from this shift: Total I/O In: 390 [P.O.:240; I.V.:150] Out: 375 [Urine:375]  Physical Exam: Neck: no adenopathy, no carotid bruit, no JVD and supple, symmetrical, trachea midline Lungs: clear to auscultation bilaterally Heart: regular rate and rhythm, S1, S2 normal and soft systolic murmur noted no S3 gallop Abdomen: soft, non-tender; bowel sounds normal; no masses,  no organomegaly Extremities: extremities normal, atraumatic, no cyanosis or edema  Lab Results:  Recent Labs  05/04/13 2051 05/05/13 1151  WBC 5.0 4.6  HGB 13.9 12.4*  PLT 207 164    Recent Labs  05/04/13 2051 05/05/13 1151  NA 134*  --   K 4.0  --   CL 97  --   CO2 19  --   GLUCOSE 62*  --   BUN 9  --   CREATININE 0.83 0.87    Recent Labs  05/05/13 0027 05/05/13 0450  TROPONINI <0.30 <0.30   Hepatic Function Panel  Recent Labs  05/04/13 2051  PROT 7.7  ALBUMIN 4.4  AST 23  ALT 21  ALKPHOS 68  BILITOT 0.5   No results found for this basename: CHOL,  in the last 72 hours No results found for this basename: PROTIME,  in the last 72 hours  Imaging: Imaging results have been reviewed and Dg Chest Portable 1 View  05/05/2013   CLINICAL DATA:  Medical clearance; shortness of breath and right-sided abdominal pain. History of smoking.  EXAM: PORTABLE CHEST - 1 VIEW   COMPARISON:  Chest radiograph performed 01/15/2013  FINDINGS: The lungs are well-aerated. Vascular congestion is noted. Mildly increased interstitial markings could reflect minimal interstitial edema. There is no evidence of pleural effusion or pneumothorax.  The cardiomediastinal silhouette is borderline enlarged. No acute osseous abnormalities are seen.  IMPRESSION: Vascular congestion and borderline cardiomegaly. Mildly increased interstitial markings could reflect minimal interstitial edema.   Electronically Signed   By: Roanna Raider M.D.   On: 05/05/2013 05:32    Cardiac Studies:  Assessment/Plan:  Status postNew-onset A. fib flutter with RVR now in sinus rhythm Hypertension  EtOH abuse  Tobacco abuse  Suicidal ideation  Depression  Plan Continue present management will stop Eliquis in 3-4 weeks and switch to aspirin if remains in sinus rhythm. I will sign off please call if needed  LOS: 2 days    Jovie Swanner N 05/06/2013, 2:01 PM

## 2013-05-06 NOTE — Progress Notes (Signed)
ANTICOAGULATION CONSULT NOTE - Follow Up Consult  Pharmacy Consult for Apixaban (Eliquis) Indication: atrial fibrillation  Allergies  Allergen Reactions  . Bee Venom Anaphylaxis    Uses epi pen-- last used 08/23/11    Patient Measurements: Height: 5\' 5"  (165.1 cm) Weight: 196 lb 3.4 oz (89 kg) IBW/kg (Calculated) : 61.5  Vital Signs: Temp: 98 F (36.7 C) (04/06 0800) Temp src: Oral (04/06 0800) BP: 122/81 mmHg (04/06 0700) Pulse Rate: 48 (04/06 0800)  Labs:  Recent Labs  05/04/13 2051 05/05/13 0027 05/05/13 0450 05/05/13 1151  HGB 13.9  --   --  12.4*  HCT 39.2  --   --  37.1*  PLT 207  --   --  164  CREATININE 0.83  --   --  0.87  TROPONINI  --  <0.30 <0.30  --     Estimated Creatinine Clearance: 106.5 ml/min (by C-G formula based on Cr of 0.87).  Medical History: Past Medical History  Diagnosis Date  . Hypertension   . Alcoholism /alcohol abuse   . Anxiety and depression   . Tobacco abuse     Medications:  Scheduled:  . apixaban  5 mg Oral BID  . folic acid  1 mg Oral Daily  . LORazepam  0-4 mg Intravenous Q6H   Followed by  . [START ON 05/07/2013] LORazepam  0-4 mg Intravenous Q12H  . metoprolol tartrate  25 mg Oral BID  . multivitamin with minerals  1 tablet Oral Daily  . sodium chloride  3 mL Intravenous Q12H  . thiamine  100 mg Oral Daily   Or  . thiamine  100 mg Intravenous Daily   Infusions:  . 0.9 % NaCl with KCl 20 mEq / L 75 mL/hr at 05/05/13 2000  . amiodarone (NEXTERONE PREMIX) 360 mg/200 mL dextrose 60 mg/hr (05/05/13 0928)   PRN: acetaminophen, acetaminophen, alum & mag hydroxide-simeth, LORazepam, LORazepam, ondansetron (ZOFRAN) IV, ondansetron, sodium chloride, zolpidem  Assessment: 48 yoM [facility] ED by police requesting medical clearance for suicidal ideation and alcohol abuse.  He is found to be in atrial fibrillation, flutter, with RVR.  Pharmacy is consulted to dose apixaban.  SCr 0.87, CrCl > 100 ml/min  CBC: Hgb 12.4,  Plt 164  Drug interactions: Amiodarone (P-glycoprotein inhibitor) and Diltiazem (moderate CYP3A4 inhibitor) may increase serum concentrations of apixaban.  Dosage adjustment not required, monitor therapy.  Goal of Therapy:  Monitor platelets by anticoagulation protocol: Yes   Plan:  1.  Continue Apixaban 5mg  PO BID. 2.  Educated patient today on how to take apixaban, signs and symptoms of bleeding, and why he has been prescribed an anticoagulant.  Patient verbalized understanding and was agreeable. 3.  Pharmacy will sign off from leaving formal notes but will continue to monitor peripherally.  Clance Boll, PharmD, BCPS Pager: [phone] 05/06/2013 9:03 AM

## 2013-05-07 DIAGNOSIS — Z634 Disappearance and death of family member: Secondary | ICD-10-CM

## 2013-05-07 DIAGNOSIS — F1994 Other psychoactive substance use, unspecified with psychoactive substance-induced mood disorder: Secondary | ICD-10-CM

## 2013-05-07 DIAGNOSIS — F17200 Nicotine dependence, unspecified, uncomplicated: Secondary | ICD-10-CM

## 2013-05-07 MED ORDER — METOPROLOL TARTRATE 12.5 MG HALF TABLET
12.5000 mg | ORAL_TABLET | Freq: Two times a day (BID) | ORAL | Status: DC
  Administered 2013-05-07 – 2013-05-09 (×4): 12.5 mg via ORAL
  Filled 2013-05-07 (×5): qty 1

## 2013-05-07 NOTE — Progress Notes (Signed)
Follow up for continued support around grief d/t loss of daughter.   Pt better able to be present with feelings of loss and sadness today.  Chaplain normalized feeling and spoke with pt about normalcy of emotion in next steps.   Belva Crome MDiv

## 2013-05-07 NOTE — Progress Notes (Addendum)
[facility] PROGRESS NOTE  [name] WUJ:[id number] DOB: [birth year] DOA: 05/04/2013 PCP: No PCP Per Patient  Assessment/Plan:  Principal Problem:  MDD (major depressive disorder)  Active Problems:  Alcoholism /alcohol abuse  Tobacco abuse  Anxiety and depression  Hypertension  Alcohol dependence   48 y.o. male with PMH of A FIB, HTN, tobacco use, alcoholism, depression brought to the Emergency Department by the police, voluntarily requesting medical clearance found in A FIB RVR, SI   1. A FIB RVR; initial HG >160's on admission; HR improved post BB, Cardizem, and Dr. Sharyn Lull performed carotid sinus massage then developed transient bradycardia; currently in a fib brady  -echo: LVEF 50-55%, Mild pulmonary hypertension  -decrease BB 12.5 BID (4/7) due to bradycardia, apixaban per cardiology, Eliquis for 3-4 weeks and switch to aspirin if remains in sinus rhythm 2. Depression; SI, patient need inpatient psychiatry treatment  -again consulted psychiatry, bedside sitter   3. Alcoholism, h/o withdrawals; denies seizures;  -monitor on CIWA; need detox   4. HTN intermittent hypotension, resolved   Patient is stable for inpatient psychiatry if indicated by psychiatry evaluation   Cardiology; if consultant consulted, please document name and whether formally or informally consulted   Code Status: full (must indicate code status--if unknown or must be presumed, indicate so)  Family Communication: d/w patient (indicate person spoken with, if applicable, with phone number if by telephone)  Disposition Plan: pend clinical improvement; inpatient psychiatry (indicate anticipated LOS)   Consultants:  cardiology   Procedures:  None   Antibiotics:  None  (indicate start date, and stop date if known)  HPI/Subjective: alert  Objective: Filed Vitals:   05/07/13 0633  BP: 127/72  Pulse: 52  Temp: 98.4 F (36.9 C)  Resp: 20    Intake/Output Summary (Last 24 hours) at  05/07/13 1102 Last data filed at 05/07/13 0813  Gross per 24 hour  Intake 2062.5 ml  Output    650 ml  Net 1412.5 ml   Filed Weights   05/05/13 0504 05/06/13 0400 05/07/13 0633  Weight: 86.4 kg (190 lb 7.6 oz) 89 kg (196 lb 3.4 oz) 89.404 kg (197 lb 1.6 oz)    Exam:   General:  alert  Cardiovascular: s1,s2 rrr  Respiratory: CTA BL  Abdomen: soft,nt,nd   Musculoskeletal: no LE edema   Data Reviewed: Basic Metabolic Panel:  Recent Labs Lab 05/04/13 2051 05/05/13 0027 05/05/13 1151  NA 134*  --   --   K 4.0  --   --   CL 97  --   --   CO2 19  --   --   GLUCOSE 62*  --   --   BUN 9  --   --   CREATININE 0.83  --  0.87  CALCIUM 9.3  --   --   MG  --  2.1  --    Liver Function Tests:  Recent Labs Lab 05/04/13 2051  AST 23  ALT 21  ALKPHOS 68  BILITOT 0.5  PROT 7.7  ALBUMIN 4.4    Recent Labs Lab 05/04/13 2051  LIPASE 29   No results found for this basename: AMMONIA,  in the last 168 hours CBC:  Recent Labs Lab 05/04/13 2051 05/05/13 1151  WBC 5.0 4.6  HGB 13.9 12.4*  HCT 39.2 37.1*  MCV 91.8 94.6  PLT 207 164   Cardiac Enzymes:  Recent Labs Lab 05/05/13 0027 05/05/13 0450  TROPONINI <0.30 <0.30   BNP (last 3 results) No results  found for this basename: PROBNP,  in the last 8760 hours CBG:  Recent Labs Lab 05/05/13 0005  GLUCAP 120*    Recent Results (from the past 240 hour(s))  MRSA PCR SCREENING     Status: None   Collection Time    05/05/13 10:20 AM      Result Value Ref Range Status   MRSA by PCR NEGATIVE  NEGATIVE Final   Comment:            The GeneXpert MRSA Assay (FDA     approved for NASAL specimens     only), is one component of a     comprehensive MRSA colonization     surveillance program. It is not     intended to diagnose MRSA     infection nor to guide or     monitor treatment for     MRSA infections.     Studies: No results found.  Scheduled Meds: . apixaban  5 mg Oral BID  . folic acid  1 mg  Oral Daily  . LORazepam  0-4 mg Intravenous Q6H   Followed by  . LORazepam  0-4 mg Intravenous Q12H  . metoprolol tartrate  12.5 mg Oral BID  . multivitamin with minerals  1 tablet Oral Daily  . sodium chloride  3 mL Intravenous Q12H  . thiamine  100 mg Oral Daily   Or  . thiamine  100 mg Intravenous Daily   Continuous Infusions: . 0.9 % NaCl with KCl 20 mEq / L 75 mL/hr at 05/05/13 2000  . amiodarone (NEXTERONE PREMIX) 360 mg/200 mL dextrose 60 mg/hr (05/05/13 0928)    Principal Problem:   MDD (major depressive disorder) Active Problems:   Alcoholism /alcohol abuse   Tobacco abuse   Anxiety and depression   Hypertension   Alcohol dependence   Atrial fibrillation with RVR    Time spent: >35 minutes     Esperanza SheetsBURIEV, Russ Looper N  [facility] Pager [phone]. If 7PM-7AM, please contact night-coverage at [email or web address], password [email or web address] 05/07/2013, 11:02 AM  LOS: 3 days

## 2013-05-07 NOTE — Progress Notes (Signed)
Clinical Social Work Progress Note PSYCHIATRY SERVICE LINE 05/07/2013  Patient:  [name]  Account:  [id number]  [place] Date:  05/04/2013  Clinical Social Worker:  Sindy Messing, LCSW  Date/Time:  05/07/2013 01:30 PM  Review of Patient  Overall Medical Condition:   Patient reports he is feeling better.   Participation Level:  Active  Participation Quality  Appropriate   Other Participation Quality:   Patient engaged during assessment.   Affect  Flat   Cognitive  Appropriate   Reaction to Medications/Concerns:   None reported   Modes of Intervention  Support   Summary of Progress/Plan at Discharge   CSW met with patient at bedside. Patient laying in bed and reports he is feeling better today and has been talking about attending MD about when he can DC.    CSW spoke with patient about how he slept last night and how he was feeling today. Patient reports he was able to sleep some but that he is still feeling depressed. Patient reports that he is still thinking about his dtr and is upset about her death. Patient feels he is managing feelings better today and is not feeling suicidal. Patient reports he is still having cravings to drink alcohol and wants treatment after DC.    CSW spoke with psych MD who reports he will come and evaluate patient. CSW explained that CSW will follow up with patient after psych MD makes recommendations and will assist with DC planning.      [place], [place] [phone]
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
## 2013-05-07 NOTE — Progress Notes (Signed)
Clinical Social Work  Attending MD called CSW and reports that psych MD has not evaluated patient but patient is medically stable for DC. CSW called [facility] who reports that patient will be seen today. CSW will continue to follow and will assist with any recommendations provided by psych MD.  Unk Lightning, LCSW [phone]

## 2013-05-08 MED ORDER — AMIODARONE HCL 200 MG PO TABS
400.0000 mg | ORAL_TABLET | Freq: Two times a day (BID) | ORAL | Status: DC
  Administered 2013-05-08 – 2013-05-09 (×2): 400 mg via ORAL
  Filled 2013-05-08 (×3): qty 2

## 2013-05-08 NOTE — Progress Notes (Signed)
[facility] PROGRESS NOTE  [name] [id number] DOB: 2Geralynn [name]/03/1965 DOA: 05/04/2013 PCP: No PCP Per Patient  Assessment/Plan:  Principal Problem:  MDD (major depressive disorder)  Active Problems:  Alcoholism /alcohol abuse  Tobacco abuse  Anxiety and depression  Hypertension  Alcohol dependence   48 y.o. male with PMH of A FIB, HTN, tobacco use, alcoholism, depression brought to the Emergency Department by the police, voluntarily requesting medical clearance found in A FIB RVR, SI   A FIB RVR; initial HG >160's on admission; HR improved post BB, Cardizem, and Dr. Sharyn LullHarwani performed carotid sinus massage then developed transient bradycardia; currently in a fib brady  -echo: LVEF 50-55%, Mild pulmonary hypertension  -decrease BB 12.5 BID (4/7) due to bradycardia, apixaban per cardiology, Eliquis for 3-4 weeks and switch to aspirin if remains in sinus rhythm - Patient converted back to a fib on 4/7 in the evening. I discussed with Dr. Sharyn LullHarwani and since now it appears that his rate is controlled, we'll continue amiodarone 400 twice a day for today, discharge patient on 200 twice a day, and Dr. Sharyn LullHarwani will see patient in clinic in one to 2 weeks Depression; SI, patient need inpatient psychiatry treatment  Plan for discharge to behavioral health likely tomorrow Alcoholism, h/o withdrawals; denies seizures;  -monitor on CIWA; need detox  HTN intermittent hypotension, resolved   Code Status: full (must indicate code status--if unknown or must be presumed, indicate so)  Family Communication: d/w patient (indicate person spoken with, if applicable, with phone number if by telephone)  Disposition Plan: pend clinical improvement; inpatient psychiatry (indicate anticipated LOS)   Consultants:  cardiology   Procedures:  None   Antibiotics:  None  (indicate start date, and stop date if known)  HPI/Subjective: alert  Objective: Filed Vitals:   05/08/13 1449  BP: 126/67   Pulse: 80  Temp: 98.2 F (36.8 C)  Resp: 18    Intake/Output Summary (Last 24 hours) at 05/08/13 1838 Last data filed at 05/08/13 0811  Gross per 24 hour  Intake    720 ml  Output      0 ml  Net    720 ml   Filed Weights   05/06/13 0400 05/07/13 0633 05/08/13 0541  Weight: 89 kg (196 lb 3.4 oz) 89.404 kg (197 lb 1.6 oz) 85.821 kg (189 lb 3.2 oz)    Exam:   General:  alert  Cardiovascular: s1,s2 rrr  Respiratory: CTA BL  Abdomen: soft,nt,nd   Musculoskeletal: no LE edema   Data Reviewed: Basic Metabolic Panel:  Recent Labs Lab 05/04/13 2051 05/05/13 0027 05/05/13 1151  NA 134*  --   --   K 4.0  --   --   CL 97  --   --   CO2 19  --   --   GLUCOSE 62*  --   --   BUN 9  --   --   CREATININE 0.83  --  0.87  CALCIUM 9.3  --   --   MG  --  2.1  --    Liver Function Tests:  Recent Labs Lab 05/04/13 2051  AST 23  ALT 21  ALKPHOS 68  BILITOT 0.5  PROT 7.7  ALBUMIN 4.4    Recent Labs Lab 05/04/13 2051  LIPASE 29   No results found for this basename: AMMONIA,  in the last 168 hours CBC:  Recent Labs Lab 05/04/13 2051 05/05/13 1151  WBC 5.0 4.6  HGB 13.9 12.4*  HCT 39.2 37.1*  MCV 91.8 94.6  PLT 207 164   Cardiac Enzymes:  Recent Labs Lab 05/05/13 0027 05/05/13 0450  TROPONINI <0.30 <0.30   BNP (last 3 results) No results found for this basename: PROBNP,  in the last 8760 hours CBG:  Recent Labs Lab 05/05/13 0005  GLUCAP 120*    Recent Results (from the past 240 hour(s))  MRSA PCR SCREENING     Status: None   Collection Time    05/05/13 10:20 AM      Result Value Ref Range Status   MRSA by PCR NEGATIVE  NEGATIVE Final   Comment:            The GeneXpert MRSA Assay (FDA     approved for NASAL specimens     only), is one component of a     comprehensive MRSA colonization     surveillance program. It is not     intended to diagnose MRSA     infection nor to guide or     monitor treatment for     MRSA infections.      Studies: No results found.  Scheduled Meds: . amiodarone  400 mg Oral BID  . apixaban  5 mg Oral BID  . folic acid  1 mg Oral Daily  . LORazepam  0-4 mg Intravenous Q12H  . metoprolol tartrate  12.5 mg Oral BID  . multivitamin with minerals  1 tablet Oral Daily  . sodium chloride  3 mL Intravenous Q12H  . thiamine  100 mg Oral Daily   Or  . thiamine  100 mg Intravenous Daily   Continuous Infusions: . amiodarone (NEXTERONE PREMIX) 360 mg/200 mL dextrose 60 mg/hr (05/05/13 0928)    Principal Problem:   MDD (major depressive disorder) Active Problems:   Alcoholism /alcohol abuse   Tobacco abuse   Anxiety and depression   Hypertension   Alcohol dependence   Atrial fibrillation with RVR  Time spent: >35 minutes   [place] Otelia Sergeant  [facility] Pager [phone]. If 7PM-7AM, please contact night-coverage at [email or web address], password [facility] 05/08/2013, 6:38 PM  LOS: 4 days

## 2013-05-08 NOTE — Progress Notes (Signed)
Clinical Social Work Progress Note PSYCHIATRY SERVICE LINE 05/08/2013  Patient:  [name]  Account:  [id number]  Admit Date:  05/04/2013  Clinical Social Worker:  Sindy Messing, LCSW  Date/Time:  05/08/2013 10:30 AM  Review of Patient  Overall Medical Condition:   CSW spoke with attending MD who reports CARDS consult today and if he is cleared then he will be medically stable to DC to inpatient psych facility. Patient is aware and remains agreeable to inpatient placement.   Participation Level:  Active  Participation Quality  Other - See comment   Other Participation Quality:   Patient flat and somewhat disengaged.   Affect  Flat   Cognitive  Appropriate   Reaction to Medications/Concerns:   None reported   Modes of Intervention  Support   Summary of Progress/Plan at Discharge   CSW met with patient at bedside. Patient laying in bed and watching TV when CSW arrived. Patient reports that he is not doing well today and had a rough evening. Patient reports he has been thinking about his dtr more often and grieving her loss. Patient reports he has not spoken with family lately. CSW encouraged patient to reach out to family in order to get updates on how dtr's mother is doing. Patient reports he will call and speak with his sister in order to get updates and additional support. Patient continues to feel depressed and feels that inpatient is needed. Patient aware that CSW will await to hear from attending MD and will follow up once medically stable.      [place], [place] [phone]

## 2013-05-08 NOTE — Progress Notes (Signed)
Brief follow up for continued support around grief, next steps in treatment.   [name] MDiv  [phone]

## 2013-05-09 ENCOUNTER — Inpatient Hospital Stay (HOSPITAL_COMMUNITY)
Admission: AD | Admit: 2013-05-09 | Discharge: 2013-05-15 | DRG: 885 | Disposition: A | Discharge status: Home / Self Care | Payer: Federal, State, Local not specified - Other | Source: Intra-hospital | Attending: Psychiatry | Admitting: Psychiatry

## 2013-05-09 ENCOUNTER — Encounter (HOSPITAL_COMMUNITY): Payer: Self-pay | Admitting: *Deleted

## 2013-05-09 DIAGNOSIS — Z5987 Material hardship due to limited financial resources, not elsewhere classified: Secondary | ICD-10-CM

## 2013-05-09 DIAGNOSIS — F17200 Nicotine dependence, unspecified, uncomplicated: Secondary | ICD-10-CM | POA: Diagnosis present

## 2013-05-09 DIAGNOSIS — G4700 Insomnia, unspecified: Secondary | ICD-10-CM | POA: Diagnosis present

## 2013-05-09 DIAGNOSIS — F1020 Alcohol dependence, uncomplicated: Secondary | ICD-10-CM

## 2013-05-09 DIAGNOSIS — F332 Major depressive disorder, recurrent severe without psychotic features: Principal | ICD-10-CM | POA: Diagnosis present

## 2013-05-09 DIAGNOSIS — Z8249 Family history of ischemic heart disease and other diseases of the circulatory system: Secondary | ICD-10-CM

## 2013-05-09 DIAGNOSIS — F32A Depression, unspecified: Secondary | ICD-10-CM

## 2013-05-09 DIAGNOSIS — I10 Essential (primary) hypertension: Secondary | ICD-10-CM | POA: Diagnosis present

## 2013-05-09 DIAGNOSIS — I4891 Unspecified atrial fibrillation: Secondary | ICD-10-CM | POA: Diagnosis present

## 2013-05-09 DIAGNOSIS — Z598 Other problems related to housing and economic circumstances: Secondary | ICD-10-CM

## 2013-05-09 DIAGNOSIS — R45851 Suicidal ideations: Secondary | ICD-10-CM

## 2013-05-09 DIAGNOSIS — IMO0001 Reserved for inherently not codable concepts without codable children: Secondary | ICD-10-CM

## 2013-05-09 DIAGNOSIS — F329 Major depressive disorder, single episode, unspecified: Secondary | ICD-10-CM

## 2013-05-09 DIAGNOSIS — F1994 Other psychoactive substance use, unspecified with psychoactive substance-induced mood disorder: Secondary | ICD-10-CM | POA: Diagnosis present

## 2013-05-09 DIAGNOSIS — Z634 Disappearance and death of family member: Secondary | ICD-10-CM

## 2013-05-09 DIAGNOSIS — F411 Generalized anxiety disorder: Secondary | ICD-10-CM | POA: Diagnosis present

## 2013-05-09 DIAGNOSIS — F419 Anxiety disorder, unspecified: Secondary | ICD-10-CM

## 2013-05-09 MED ORDER — ACETAMINOPHEN 325 MG PO TABS: 650.0000 mg | ORAL_TABLET | Freq: Four times a day (QID) | ORAL | Status: DC | PRN

## 2013-05-09 MED ORDER — METOPROLOL TARTRATE 25 MG PO TABS
ORAL_TABLET | ORAL | Status: AC
  Filled 2013-05-09: qty 1

## 2013-05-09 MED ORDER — ALUM & MAG HYDROXIDE-SIMETH 200-200-20 MG/5ML PO SUSP: 30.0000 mL | ORAL | Status: DC | PRN

## 2013-05-09 MED ORDER — APIXABAN 5 MG PO TABS: 5.0000 mg | ORAL_TABLET | Freq: Two times a day (BID) | ORAL | Status: DC

## 2013-05-09 MED ORDER — MAGNESIUM HYDROXIDE 400 MG/5ML PO SUSP: 30.0000 mL | Freq: Every day | ORAL | Status: DC | PRN

## 2013-05-09 MED ORDER — APIXABAN 5 MG PO TABS
5.0000 mg | ORAL_TABLET | Freq: Two times a day (BID) | ORAL | Status: DC
  Administered 2013-05-09 – 2013-05-15 (×12): 5 mg via ORAL
  Filled 2013-05-09 (×17): qty 1

## 2013-05-09 MED ORDER — METOPROLOL TARTRATE 12.5 MG HALF TABLET
12.5000 mg | ORAL_TABLET | Freq: Two times a day (BID) | ORAL | Status: DC
Start: 2013-05-09 — End: 2013-05-15
  Administered 2013-05-10 – 2013-05-14 (×10): 12.5 mg via ORAL
  Administered 2013-05-15: 08:00:00 via ORAL
  Filled 2013-05-09 (×16): qty 1

## 2013-05-09 MED ORDER — AMIODARONE HCL 400 MG PO TABS: 200.0000 mg | ORAL_TABLET | Freq: Two times a day (BID) | ORAL | Status: DC

## 2013-05-09 MED ORDER — ASPIRIN 325 MG PO TABS: 650.0000 mg | ORAL_TABLET | Freq: Four times a day (QID) | ORAL | Status: DC | PRN

## 2013-05-09 MED ORDER — AMIODARONE HCL 200 MG PO TABS
200.0000 mg | ORAL_TABLET | Freq: Two times a day (BID) | ORAL | Status: DC
  Administered 2013-05-09 – 2013-05-15 (×12): 200 mg via ORAL
  Filled 2013-05-09 (×18): qty 1

## 2013-05-09 MED ORDER — METOPROLOL TARTRATE 25 MG PO TABS: 12.5000 mg | ORAL_TABLET | Freq: Two times a day (BID) | ORAL | Status: DC

## 2013-05-09 NOTE — Discharge Summary (Signed)
Physician Discharge Summary  Robert OchsJohn Aldea ZOX:[id number] DOB: 05/16/1965 DOA: 05/04/2013  PCP: No PCP Per Patient  Admit date: 05/04/2013 Discharge date: 05/09/2013  Time spent: 35 minutes  Recommendations for Outpatient Follow-up:  1. Follow up with Dr. Sharyn LullHarwani in clinic, you have an appointment on 05/15/13 at 3:15 pm. 2. [facility]   Discharge Diagnoses:  Principal Problem:   MDD (major depressive disorder) Active Problems:   Alcoholism /alcohol abuse   Tobacco abuse   Anxiety and depression   Hypertension   Alcohol dependence   Atrial fibrillation with RVR  Discharge Condition: stable  Diet recommendation: regular  Filed Weights   05/07/13 0633 05/08/13 0541 05/09/13 0514  Weight: 89.404 kg (197 lb 1.6 oz) 85.821 kg (189 lb 3.2 oz) 84.7 kg (186 lb 11.7 oz)   History of present illness:  Robert OchsJohn [name] is a 48 y.o. male with PMH of A FIB, HTN, tobacco use, alcoholism, depression brought to the Emergency Department by the police, voluntarily requesting medical clearance. He reported drinking alcohol daily after and persistent suicidal ideation after loosing his 65ele[age] year old daughter who [place] in [place] few days ago; The pt states he was drinking today because he "does not want to live." He reports that "if I walk out of here tonight, I will kill myself. He ahd mild non exertional chest pain, SOB, and vomited x 2; denies fever, cough; in ED he is found to be in A fib RVR, which did not improve after IV BB, IV amiodarone  Hospital Course:  A FIB RVR; initial HG >160's on admission; HR improved post BB, Cardizem, and Dr. Sharyn LullHarwani performed carotid sinus massage then developed transient bradycardia; currently in a fib brady. Decrease BB 12.5 BID (4/7) due to bradycardia, apixaban per cardiology. Initially in sinus rhythm but patient converted back to a fib on 4/7 in the evening. I discussed with Dr. Sharyn LullHarwani and since now it appears that he is rate controlled, we'll continue  amiodarone 400 twice a day for today, discharge patient on 200 twice a day, and Dr. Sharyn LullHarwani will see patient in clinic next week and his Amiodarone is likely to be changed to once daily.  Depression; SI, patient need inpatient psychiatry treatment  Plan for discharge to behavioral health Alcoholism, h/o withdrawals; denies seizures;  HTN intermittent hypotension, resolved   Procedures:  2D echo  Study Conclusions - Left ventricle: The cavity size was normal. Wall thickness was normal. Systolic function was low normal to mildly reduced. The estimated ejection fraction was in the range of 50% to 55%. Wall motion was normal; there were no regional wall motion abnormalities. Left ventricular diastolic function parameters were normal. - Aortic valve: There was no stenosis. - Mitral valve: No significant regurgitation. - Left atrium: The atrium was mildly dilated. - Right ventricle: The cavity size was normal. Systolic function was normal. - Right atrium: The atrium was mildly dilated. - Tricuspid valve: Peak RV-RA gradient: 25mm Hg (S). - Pulmonary arteries: PA peak pressure: 40mm Hg (S). - Systemic veins: IVC measured 2.5 cm with minimal respirophasic variation, suggesting RA pressure 15 mmHg.  Impressions: Normal LV size with low normal to mildly reduced systolic function, EF 50-55%. Diastolic function appeared normal. Normal RV size and systolic function. Mild biatrial enlargement. Mild pulmonary hypertension with dilated IVC suggesting elevated RV filling pressure.  Consultations:  Cardiology   Discharge Exam: Filed Vitals:   05/08/13 [id number] 05/08/13 1449 05/08/13 2044 05/09/13 0514  BP: 135/76 126/67 134/82 137/81  Pulse:  75 80 88 66  Temp: 98.5 F (36.9 C) 98.2 F (36.8 C) 97.9 F (36.6 C) 98.1 F (36.7 C)  TempSrc: Oral Oral Oral Oral  Resp: 18 18 18 18   Height:      Weight:    84.7 kg (186 lb 11.7 oz)  SpO2: 99% 99% 97% 98%    General: NAD Cardiovascular: irregular Respiratory:  CTA biL  Discharge Instructions     Medication List    STOP taking these medications       CARDIZEM CD 120 MG 24 hr capsule  Generic drug:  diltiazem      TAKE these medications       amiodarone 400 MG tablet  Commonly known as:  PACERONE  Take 0.5 tablets (200 mg total) by mouth 2 (two) times daily.     apixaban 5 MG Tabs tablet  Commonly known as:  ELIQUIS  Take 1 tablet (5 mg total) by mouth 2 (two) times daily.     aspirin 325 MG tablet  Take 650 mg by mouth every 6 (six) hours as needed (pain).     metoprolol tartrate 25 MG tablet  Commonly known as:  LOPRESSOR  Take 0.5 tablets (12.5 mg total) by mouth 2 (two) times daily.           Follow-up Information   Follow up with Robynn Pane, MD. Schedule an appointment as soon as possible for a visit in 1 week.   Specialty:  Cardiology   Contact information:   [address] [place] [address] [phone]       The results of significant diagnostics from this hospitalization (including imaging, microbiology, ancillary and laboratory) are listed below for reference.    Significant Diagnostic Studies: Dg Chest Portable 1 View  05/05/2013   CLINICAL DATA:  Medical clearance; shortness of breath and right-sided abdominal pain. History of smoking.  EXAM: PORTABLE CHEST - 1 VIEW  COMPARISON:  Chest radiograph performed 01/15/2013  FINDINGS: The lungs are well-aerated. Vascular congestion is noted. Mildly increased interstitial markings could reflect minimal interstitial edema. There is no evidence of pleural effusion or pneumothorax.  The cardiomediastinal silhouette is borderline enlarged. No acute osseous abnormalities are seen.  IMPRESSION: Vascular congestion and borderline cardiomegaly. Mildly increased interstitial markings could reflect minimal interstitial edema.   Electronically Signed   By: Roanna Raider M.D.   On: 05/05/2013 05:32    Microbiology: Recent Results (from the past 240  hour(s))  MRSA PCR SCREENING     Status: None   Collection Time    05/05/13 10:20 AM      Result Value Ref Range Status   MRSA by PCR NEGATIVE  NEGATIVE Final   Comment:            The GeneXpert MRSA Assay (FDA     approved for NASAL specimens     only), is one component of a     comprehensive MRSA colonization     surveillance program. It is not     intended to diagnose MRSA     infection nor to guide or     monitor treatment for     MRSA infections.     Labs: Basic Metabolic Panel:  Recent Labs Lab 05/04/13 2051 05/05/13 0027 05/05/13 1151  NA 134*  --   --   K 4.0  --   --   CL 97  --   --   CO2 19  --   --  GLUCOSE 62*  --   --   BUN 9  --   --   CREATININE 0.83  --  0.87  CALCIUM 9.3  --   --   MG  --  2.1  --    Liver Function Tests:  Recent Labs Lab 05/04/13 2051  AST 23  ALT 21  ALKPHOS 68  BILITOT 0.5  PROT 7.7  ALBUMIN 4.4    Recent Labs Lab 05/04/13 2051  LIPASE 29   No results found for this basename: AMMONIA,  in the last 168 hours CBC:  Recent Labs Lab 05/04/13 2051 05/05/13 1151  WBC 5.0 4.6  HGB 13.9 12.4*  HCT 39.2 37.1*  MCV 91.8 94.6  PLT 207 164   Cardiac Enzymes:  Recent Labs Lab 05/05/13 0027 05/05/13 0450  TROPONINI <0.30 <0.30   BNP: BNP (last 3 results) No results found for this basename: PROBNP,  in the last 8760 hours CBG:  Recent Labs Lab 05/05/13 0005  GLUCAP 120*       Signed:  Costin M Gherghe  [facility] 05/09/2013, 8:58 AM

## 2013-05-09 NOTE — Progress Notes (Signed)
Clinical Social Work  CSW followed up with [facility] Inetta Fermo) who reports that she is still reviewing information and will call CSW back to determine if they can accept patient. [place] continues to review patient.  CSW will continue to follow.  [place], [place] [phone]

## 2013-05-09 NOTE — Progress Notes (Signed)
Clinical Social Work  Patient accepted to [facility] room 306-1 by Dr. Dub Mikes. RN to call report to [phone]. CSW informed patient, RN, and MD of DC plans and all parties agreeable. Patient signed voluntary form and reports he is happy that he will get additional care at DC. CSW coordinated transportation via [employer].  CSW is signing off but available if needed.  [place], [place] [phone]

## 2013-05-09 NOTE — Progress Notes (Signed)
Clinical Social Work  CSW reviewed chart which stated that patient is medically stable to DC to inpatient psych hospital. CSW met with patient at bedside who is aware and remains agreeable to plans. CSW explained DC process and patient reports no questions at this time. CSW contacted the following facilities regarding placement:  [facility]- available beds. Referral sent.  [facility]- referral sent. [facility] will call after reviewing referral  [facility]- left a message at 8:55am  [facility]- no available beds  [facility]- patient would have to be put on [facility] waiting list due to no insurance. Hospital reports a long waiting list at this time.  CSW will continue to follow in order to find placement for patient.  [place], [place] [phone]

## 2013-05-09 NOTE — Progress Notes (Signed)
The patient attended Karaoke group this evening and behaved appropriately. The patient's affect brightened as he sang two different Elvis songs.

## 2013-05-09 NOTE — Progress Notes (Signed)
Chaplain follow up for support as pt discharging to [facility].

## 2013-05-09 NOTE — Progress Notes (Signed)
Discharged to [facility], report given to [facility]. Patient transported by [facility] . Belonging returned to patient, all accounted for, given to [employer].

## 2013-05-09 NOTE — Progress Notes (Signed)
Patient ID: [name], male   DOB: 03-Jul-1965, 49 y.o.   MRN: [id number] Nursing admission note: [name] is a 48 yo male that has had previous admissions at [facility].  Patient was brought to the ED on 4/4 by [employer].  Patient came in voluntarily stating persistent suicidal ideation and excessive drinking.  Patient states that he lost his 42 yo daughter a few days ago.  He states that she was hit by a car.  Per patient, his ex and daughter lived in [place].  He states he drinks because "he doesn't want to live.  If I walk out of here tonight, I will kill myself.  Patient was transferred to the telemetry floor for medical clearance.  Patient found to be in A fib with bradycardia.  Patient has been monitored there before admission to [facility].  Patient reports decreased sleep, decreased appetite and decreased concentration.  He presents with flat affect, however, did laugh at times. He reports severe depressive symptoms and grief.  Patient has a hx of HTN, alcoholism, anxiety and depression.  He states that he drinks "9 40's a day plus some liquor."  Patient has been at several rehabilitation facilities; he has been to [facility], [facility] and [facility].  Patient was discharged from [facility] February 6th and relapsed on February 11th.  He has not had any lengths of sobriety; he has been drinking since he was 15.  It should also be noted that this patient has reported in past admissions that his daughter had just passed.  He stated this admission that she was his only child and was hit by a car.  He denies any HI/AVH.  Patient's pulse was 50 upon admission.  Patient was oriented to room on 500 hall.  Patient is usually on the 300 hall for substance abuse.

## 2013-05-10 DIAGNOSIS — R45851 Suicidal ideations: Secondary | ICD-10-CM

## 2013-05-10 DIAGNOSIS — Z634 Disappearance and death of family member: Secondary | ICD-10-CM

## 2013-05-10 DIAGNOSIS — F1994 Other psychoactive substance use, unspecified with psychoactive substance-induced mood disorder: Secondary | ICD-10-CM

## 2013-05-10 DIAGNOSIS — F1020 Alcohol dependence, uncomplicated: Secondary | ICD-10-CM

## 2013-05-10 MED ORDER — TRAZODONE HCL 50 MG PO TABS
50.0000 mg | ORAL_TABLET | Freq: Every day | ORAL | Status: DC
  Administered 2013-05-10 – 2013-05-13 (×4): 50 mg via ORAL
  Filled 2013-05-10 (×7): qty 1

## 2013-05-10 MED ORDER — ADULT MULTIVITAMIN W/MINERALS CH
1.0000 | ORAL_TABLET | Freq: Every day | ORAL | Status: DC
  Administered 2013-05-10 – 2013-05-15 (×6): 1 via ORAL
  Filled 2013-05-10 (×9): qty 1

## 2013-05-10 MED ORDER — FLUOXETINE HCL 20 MG PO CAPS
20.0000 mg | ORAL_CAPSULE | Freq: Every day | ORAL | Status: DC
Start: 2013-05-10 — End: 2013-05-15
  Administered 2013-05-10 – 2013-05-15 (×6): 20 mg via ORAL
  Filled 2013-05-10 (×8): qty 1

## 2013-05-10 MED ORDER — ENSURE COMPLETE PO LIQD
237.0000 mL | Freq: Two times a day (BID) | ORAL | Status: DC
  Administered 2013-05-10 – 2013-05-14 (×8): 237 mL via ORAL

## 2013-05-10 NOTE — H&P (Signed)
Psychiatric Admission Assessment Adult  Patient Identification:  [name] Date of Evaluation:  05/10/2013 Chief Complaint:  MAJOR DEPRESSIVE DISORDER  History of Present Illness: Patient is a 48 years old WM, single, works in [place] and lives with his uncle. He was admitted voluntarily from [facility] medical floor with increased symptoms of depression and suicidal ideations. He has atrial fibrillations and alcohol dependence. Patient was admitted to [facility] long hospital from [facility] long emergency department for alcohol intoxication, depressed mood and suicide thoughts expressed to her friend this weekend. The patient has known to [facility] from his multiple hospitalization for alcohol detox treatment. Patient reportedly relapse drinking alcohol a week after being sober. He was completed [facility] residential treatment 02/07/13 - 03/08/13. Patient currently stressed about loss of is 55 years old daughter ([name]) in a car - bike accident. Reportedly his daughter was living in [place] with her mother. Patient ex wife was admitted in mental health facility and not doing well. Patient stated he he does not have a excuse for him to drink but has been drinking more since the incident about a week ago. Patient has been seeking alcohol detox treatment and rehabilitation placement at this time. Patient denies symptoms of anxiety and psychosis. Patient reported he has a suicidal thoughts before coming to the hospital but now he minimizes it.  Elements:  Location:  depression and alcohol dependece. Quality:  acute. Severity:  suicial thoughts while intoxicated. Timing:  death of his 17 years old daughter. Associated Signs/Synptoms: Depression Symptoms:  depressed mood, anhedonia, psychomotor retardation, fatigue, feelings of worthlessness/guilt, hopelessness, recurrent thoughts of death, anxiety, weight loss, decreased labido, decreased  appetite, (Hypo) Manic Symptoms:  Distractibility, Impulsivity, Irritable Mood, Anxiety Symptoms:  Excessive Worry, Psychotic Symptoms:  denied PTSD Symptoms: NA Total Time spent with patient: 45 minutes  Psychiatric Specialty Exam: Physical Exam Full physical performed in Emergency Department. I have reviewed this assessment and concur with its findings.   Review of Systems  Eyes: Positive for blurred vision.  Respiratory: Positive for cough.   Gastrointestinal: Positive for heartburn.  Genitourinary: Positive for urgency.  Musculoskeletal: Positive for myalgias.  Psychiatric/Behavioral: Positive for depression, suicidal ideas and substance abuse. The patient has insomnia.     Blood pressure 120/74, pulse 47, temperature 97.3 F (36.3 C), temperature source Oral, resp. rate 16, height 5\' 9"  (1.753 m), weight 85.276 kg (188 lb), SpO2 98.00%.Body mass index is 27.75 kg/(m^2).  General Appearance: Disheveled and Guarded  Patent attorney::  Fair  Speech:  Clear and Coherent and Slow  Volume:  Decreased  Mood:  Anxious, Depressed, Hopeless and Worthless  Affect:  Depressed and Flat  Thought Process:  Coherent and Goal Directed  Orientation:  NA  Thought Content:  WDL  Suicidal Thoughts:  Yes.  without intent/plan  Homicidal Thoughts:  No  Memory:  Immediate;   Fair  Judgement:  Intact  Insight:  Fair and Lacking  Psychomotor Activity:  Decreased and Restlessness  Concentration:  Fair  Recall:  [employer] of Knowledge:Good  Language: Good  Akathisia:  NA  Handed:  Right  AIMS (if indicated):     Assets:  Communication Skills Desire for Improvement Financial Resources/Insurance Housing Leisure Time Physical Health Resilience Social Support Transportation Vocational/Educational  Sleep:  Number of Hours: 6.75    Musculoskeletal: Strength & Muscle Tone: within normal limits Gait & Station: normal Patient leans: N/A  Past Psychiatric History: Diagnosis: Alcohol  dependence and substance induced mood disorder  Hospitalizations:[facility]  Outpatient Care:  Substance Abuse Care:  Self-Mutilation:  Suicidal Attempts:  Violent Behaviors:   Past Medical History:   Past Medical History  Diagnosis Date  . Hypertension   . Alcoholism /alcohol abuse   . Anxiety and depression   . Tobacco abuse    None. Allergies:   Allergies  Allergen Reactions  . Bee Venom Anaphylaxis    Uses epi pen-- last used 08/23/11   PTA Medications: Prescriptions prior to admission  Medication Sig Dispense Refill  . amiodarone (PACERONE) 400 MG tablet Take 0.5 tablets (200 mg total) by mouth 2 (two) times daily.      Marland Kitchen apixaban (ELIQUIS) 5 MG TABS tablet Take 1 tablet (5 mg total) by mouth 2 (two) times daily.  60 tablet    . aspirin 325 MG tablet Take 650 mg by mouth every 6 (six) hours as needed (pain).      . metoprolol tartrate (LOPRESSOR) 25 MG tablet Take 0.5 tablets (12.5 mg total) by mouth 2 (two) times daily.        Previous Psychotropic Medications:  Medication/Dose  None at this time               Substance Abuse History in the last 12 months:  yes  Consequences of Substance Abuse: Withdrawal Symptoms:   Cramps Diarrhea Nausea Vomiting  Social History:  reports that he has been smoking Cigarettes.  He has a 30 pack-year smoking history. He has never used smokeless tobacco. He reports that he drinks alcohol. He reports that he does not use illicit drugs. Additional Social History:                      Current Place of Residence:   Place of Birth:   Family Members: Marital Status:  Single Children:  Sons:  Daughters: Relationships: Education:  12 years of special education in [place] Educational Problems/Performance: Religious Beliefs/Practices: History of Abuse (Emotional/Phsycial/Sexual) Teacher, music History:  None. Legal History: Hobbies/Interests:  Family History:   Family History  Problem Relation  Age of Onset  . Heart attack Mother   . Heart attack Father   . Hypertension Mother   . Hypertension Father     No results found for this or any previous visit (from the past 72 hour(s)). Psychological Evaluations:  Assessment:   DSM5:  Schizophrenia Disorders:   Obsessive-Compulsive Disorders:   Trauma-Stressor Disorders:   Substance/Addictive Disorders:   Depressive Disorders:    AXIS I:  Bereavement, Generalized Anxiety Disorder, Major Depression, Recurrent severe, Substance Induced Mood Disorder and Alcohol dependence AXIS II:  Deferred AXIS III:   Past Medical History  Diagnosis Date  . Hypertension   . Alcoholism /alcohol abuse   . Anxiety and depression   . Tobacco abuse    AXIS IV:  housing problems, other psychosocial or environmental problems, problems related to social environment and problems with primary support group AXIS V:  41-50 serious symptoms  Treatment Plan/Recommendations:  Admit for depression and suicidal ideations and history of alcohol dependence  Treatment Plan Summary: Daily contact with patient to assess and evaluate symptoms and progress in treatment Medication management Current Medications:  Current Facility-Administered Medications  Medication Dose Route Frequency Provider Last Rate Last Dose  . acetaminophen (TYLENOL) tablet 650 mg  650 mg Oral Q6H PRN Verne Spurr, PA-C      . alum & mag hydroxide-simeth (MAALOX/MYLANTA) 200-200-20 MG/5ML suspension 30 mL  30 mL Oral Q4H PRN Verne Spurr, PA-C      .  amiodarone (PACERONE) tablet 200 mg  200 mg Oral BID Verne SpurrNeil Mashburn, PA-C   200 mg at 05/10/13 [id number]  . apixaban (ELIQUIS) tablet 5 mg  5 mg Oral BID Verne SpurrNeil Mashburn, PA-C   5 mg at 05/10/13 [id number]  . aspirin tablet 650 mg  650 mg Oral Q6H PRN Verne SpurrNeil Mashburn, PA-C      . magnesium hydroxide (MILK OF MAGNESIA) suspension 30 mL  30 mL Oral Daily PRN Verne SpurrNeil Mashburn, PA-C      . metoprolol tartrate (LOPRESSOR) tablet 12.5 mg  12.5 mg Oral BID Verne SpurrNeil  Mashburn, PA-C   12.5 mg at 05/10/13 [id number]    Observation Level/Precautions:  15 minute checks  Laboratory:  Reviewed admission labs  Psychotherapy:  IPT, CBT, group and milieu therapy and substance abuse counseling  Medications:  Fluoxetine 20 mg and Trazodone 50 mg PO Qhs  Consultations:  none  Discharge Concerns:  safety  Estimated LOS: 4-5 days  Other:  Discussed with case manager regarding his request of substance abuse program for rehab.   I certify that inpatient services furnished can reasonably be expected to improve the patient's condition.   Randal BubaJanardhaha R Kadyn Chovan 4/10/20159:17 AM

## 2013-05-10 NOTE — [facility] Counselor (Signed)
Adult Psychosocial Assessment Update Interdisciplinary Team  Previous Behavior Health Hospital admissions/discharges:  Admissions Discharges  Date: 01/10/13 Date: 01/21/13  Date: Date:  Date: Date:  Date: Date:  Date: Date:   Changes since the last Psychosocial Assessment (including adherence to outpatient mental health and/or substance abuse treatment, situational issues contributing to decompensation and/or relapse). Patient reports admitted due to alcohol dependence and the death of his 53 year old daughter last week.  He reports drinking seven to nine 40 ounce beers daily.  Patient reports he also had SI prior to admission.Marland Kitchen             Discharge Plan 1. Will you be returning to the same living situation after discharge?   Yes: No:      If no, what is your plan?    Patient reports he has a place to live.       2. Would you like a referral for services when you are discharged? Yes:     If yes, for what services?  No:       Possibly.  Patient is considering residential treatment.       Summary and Recommendations (to be completed by the evaluator) [name] is a 48 years old male admitted with Major Depression Disorder.  He will benefit from crisis stabilization, evaluation for medication, psycho-education groups for coping skills development, group therapy and case management for discharge planning.                        Signature:  Joesph July Jaliza Seifried, 05/10/2013 12:23 PM

## 2013-05-10 NOTE — Progress Notes (Signed)
D) Pt has attended the groups and interacts with his peers appropriately. Pt rates his depression at a 3 and his hopelessness at a 2. Denies SI and HI. Requested that he get a phone number out of her personal belongings today so that he could contact his friends who have a new phone number. Affect and mood are appropriate. A) Given support, reassurance and praise along with encouragement. Praised for his participation in the groups. R) Denies SI and HI.

## 2013-05-10 NOTE — Progress Notes (Signed)
Assumed care of patient at 2300. Pt has continued to rest in bed with eyes closed. RR WNL, even and unlabored. No acute distress. No complaints voiced. Level III obs in place for safety and pt is safe. [name]

## 2013-05-10 NOTE — Progress Notes (Addendum)
Follow up with pt for continued grief support after transfer from [facility] to [facility].     Pt is beginning to step into grief and loss.  While at [facility] he was hopeful to move through loss by keeping himself busy and "powering through" - stating, "I just have to move on."  Today he was tearful and described realizing that he can't make grief go away.  Described grief as "a scar, a branding that will always be there."  Spoke of the barrier of cultural expectation: his step-father repeatedly telling him that men do not cry.  [name] realized this was not a part of his experience and stated that he needed to cry and to step into places of pain, as it was not helpful to him to continue to ignore loss.   I speaking of feelings related to his grief: [name] feels guilty and responsible for his daughter's death due to alcoholism.  Described "if I had not been an alcoholic, then [name] (ex-wife) and I would not have broken up and April would not have been there.  Also feels anger toward father who was alcoholic and created relational discord within [name].  Chaplain normalized these feelings, provided continued support around loss and education around grief process.   Will continue to follow for support.  Please page as changes or needs arise.   [name] MDiv  [phone]

## 2013-05-10 NOTE — Progress Notes (Signed)
NUTRITION ASSESSMENT  Pt identified as at risk on the Malnutrition Screen Tool  INTERVENTION: 1. Educated patient on the importance of nutrition and encouraged intake of food and beverages. 2. Discussed weight goals. 3. Supplements: MVI daily and Ensure Complete po BID, each supplement provides 350 kcal and 13 grams of protein   NUTRITION DIAGNOSIS: Unintentional weight loss related to sub-optimal intake as evidenced by pt report.   Goal: Pt to meet >/= 90% of their estimated nutrition needs.  Monitor:  PO intake  Assessment:  Patient admitted with SI and etoh abuse.  Patient reports that he is very upset about his daughter's death 1 week ago and has not been eating well for the past week.  (Chart indicates that he discussed the daughter's death on last admit as well.)  Weight has increased from last admit.  48 y.o. male  Height: Ht Readings from Last 1 Encounters:  05/09/13 5\' 9"  (1.753 m)    Weight: Wt Readings from Last 1 Encounters:  05/09/13 188 lb (85.276 kg)    Weight Hx: Wt Readings from Last 10 Encounters:  05/09/13 188 lb (85.276 kg)  05/09/13 186 lb 11.7 oz (84.7 kg)  01/19/13 161 lb (73.029 kg)  01/18/13 162 lb (73.483 kg)  01/18/13 177 lb 0.5 oz (80.3 kg)  10/15/11 205 lb (92.987 kg)  07/20/11 176 lb 5.9 oz (80 kg)  07/04/11 181 lb (82.101 kg)  07/03/11 205 lb (92.987 kg)  01/01/11 154 lb (69.854 kg)    BMI:  Body mass index is 27.75 kg/(m^2). Pt meets criteria for overweight based on current BMI.  Estimated Nutritional Needs: Kcal: 25-30 kcal/kg Protein: > 1 gram protein/kg Fluid: 1 ml/kcal  Diet Order: General Pt is also offered choice of unit snacks mid-morning and mid-afternoon.  Pt is eating as desired.   Lab results and medications reviewed.   Oran Rein, RD, LDN Clinical Inpatient Dietitian Pager:  [phone] Weekend and after hours pager:  [phone]
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
## 2013-05-10 NOTE — [facility] Suicide Risk Assessment (Signed)
Suicide Risk Assessment  Admission Assessment     Nursing information obtained from:    Demographic factors:    Current Mental Status:    Loss Factors:    Historical Factors:    Risk Reduction Factors:    Total Time spent with patient: 45 minutes  CLINICAL FACTORS:   Depression:   Anhedonia Comorbid alcohol abuse/dependence Hopelessness Impulsivity Insomnia Recent sense of peace/wellbeing Severe Alcohol/Substance Abuse/Dependencies Unstable or Poor Therapeutic Relationship Previous Psychiatric Diagnoses and Treatments Medical Diagnoses and Treatments/Surgeries   COGNITIVE FEATURES THAT CONTRIBUTE TO RISK:  Closed-mindedness Loss of executive function Polarized thinking Thought constriction (tunnel vision)    SUICIDE RISK:   Moderate:  Frequent suicidal ideation with limited intensity, and duration, some specificity in terms of plans, no associated intent, good self-control, limited dysphoria/symptomatology, some risk factors present, and identifiable protective factors, including available and accessible social support.  PLAN OF CARE: Admit voluntarily from [facility] for depression, grief and suicidal thoughts. He has alcohol dependence and several previous alcohol detox treatments.   I certify that inpatient services furnished can reasonably be expected to improve the patient's condition.   Randal Buba Corynn Solberg 05/10/2013, 9:15 AM

## 2013-05-10 NOTE — [facility] Group Notes (Signed)
[facility] LCSW Group Therapy  Feelings Around Relapse 1:15 -2:30        05/10/2013  3:48 PM   Type of Therapy:  Group Therapy  Participation Level:  Appropriate  Participation Quality:  Appropriate  Affect:  Appropriate  Cognitive:  Attentive Appropriate  Insight:  Developing/Improving  Engagement in Therapy: Developing/Improving  Modes of Intervention:  Discussion Exploration Problem-Solving Supportive  Summary of Progress/Problems:  The topic for today was feelings around relapse.    Patient processed feelings toward relapse and was able to relate to peers. He shared relapse for him means spending time with people he knows will be using.  Patient identified coping skills that can be used to prevent a relapse.   Wynn Banker 05/10/2013 3:48 PM
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
## 2013-05-11 NOTE — Progress Notes (Signed)
Patient ID: [name], male   DOB: [birth year], 48 y.o.   MRN: [id number] D)  Has been smiling and pleasant with staff and peers this evening, interacting appropriately, and has spent most of the evening in the dayroom.  Attended group and participating in the milieu, has been compliant with meds.  Denied thoughts of self harm, stated has had a good evening. A)  Will continue to monitor for safety, continue POC R)  Safety maintained.

## 2013-05-11 NOTE — [facility] Group Notes (Signed)
[facility] LCSW Group Therapy  05/11/2013 4:05 PM  Type of Therapy:  Group Therapy  Participation Level:  Active  Participation Quality:  Appropriate, Sharing and Supportive  Affect:  Appropriate  Cognitive:  Alert and Oriented  Insight:  Developing/Improving, Engaged and Supportive  Engagement in Therapy:  Developing/Improving, Engaged and Supportive  Modes of Intervention:  Discussion, Education, Exploration, Rapport Building and Support  Summary of Progress/Problems:Pt was engaged and was able to identify a positive social support and coping skill.  Pt was respectful of other group members and was engaged in discussion.  [name] 05/11/2013, 4:05 PM

## 2013-05-11 NOTE — Progress Notes (Signed)
Psychoeducational Group Note  Date: 05/11/2013 Time:  1015  Group Topic/Focus:  Identifying Needs:   The focus of this group is to help patients identify their personal needs that have been historically problematic and identify healthy behaviors to address their needs.  Participation Level:  Active  Participation Quality:  Appropriate  Affect:  Appropriate  Cognitive:  Oriented  Insight:  Improving  Engagement in Group:  Engaged  Additional Comments:  Pt attended the group and participated in the discussion  Santanna Olenik A  

## 2013-05-11 NOTE — Progress Notes (Signed)
D) Pt has attended the groups and interacts with his peers appropriately. Rates his depression at an 8 and his hopelessness at a 7. Denies SI and HI. Pt participates in group and shows good insight. Affect is brighter with interaction. A) Given support, reassurance and praise along with encouragement. Provided with a 1:1 where Pt expressed his sadness over his losses. R) Denies SI and HI

## 2013-05-11 NOTE — Progress Notes (Signed)
Adult Psychoeducational Group Note  Date:  05/11/2013 Time:  1:13 AM  Group Topic/Focus:  Wrap-Up Group:   The focus of this group is to help patients review their daily goal of treatment and discuss progress on daily workbooks.  Participation Level:  Active  Participation Quality:  Appropriate  Affect:  Appropriate  Cognitive:  Alert  Insight: Appropriate  Engagement in Group:  Engaged  Modes of Intervention:  Discussion  Additional Comments:  Pt stated that his day has been up and down. His goal is to get something out of the groups while he is here. He plans to continue to take things one day at a time and stay positive.   Flonnie Hailstone 05/11/2013, 1:13 AM

## 2013-05-11 NOTE — Progress Notes (Signed)
.  Psychoeducational Group Note    Date: 05/11/2013 Time:  0930   Goal Setting Purpose of Group: To be able to set a goal that is measurable and that can be accomplished in one day Participation Level:  Active  Participation Quality:  Appropriate  Affect:  Appropriate  Cognitive:  Appropriate  Insight:  Engaged and Improving  Engagement in Group:  Engaged  Additional Comments:  Pt attended the group and partisipated  Tong Pieczynski A  

## 2013-05-11 NOTE — Progress Notes (Signed)
The focus of this group is to help patients review their daily goal of treatment and discuss progress on daily workbooks. Pt attended the evening group session and responded to all discussion prompts from the Writer. Pt shared that today was a good day on the unit, the highlight of which was playing basketball in the courtyard with his peers. Pt reported having no additional needs from Nursing Staff this evening. Pt's affect was appropriate and the Writer observed him laughing and joking with his peers throughout group.

## 2013-05-12 DIAGNOSIS — F332 Major depressive disorder, recurrent severe without psychotic features: Principal | ICD-10-CM

## 2013-05-12 DIAGNOSIS — F411 Generalized anxiety disorder: Secondary | ICD-10-CM

## 2013-05-12 DIAGNOSIS — F1010 Alcohol abuse, uncomplicated: Secondary | ICD-10-CM

## 2013-05-12 MED ORDER — OLANZAPINE 5 MG PO TBDP: 5.0000 mg | ORAL_TABLET | Freq: Two times a day (BID) | ORAL | Status: DC | PRN

## 2013-05-12 NOTE — Progress Notes (Signed)
D) Pt has been attending the groups and interacts with his peers. Affect and mood are more appropriate today. Rates his depression at a 5 and his hopelessness at a 4. A) Given support, reassurance and praise along with encouragement. R) Denies SI and HI

## 2013-05-12 NOTE — Progress Notes (Signed)
Psychoeducational Group Note  Date:  05/12/2013 Time:  1015  Group Topic/Focus:  Making Healthy Choices:   The focus of this group is to help patients identify negative/unhealthy choices they were using prior to admission and identify positive/healthier coping strategies to replace them upon discharge.  Participation Level:  Active  Participation Quality:  Appropriate  Affect:  Flat  Cognitive:  Oriented  Insight:  Improving  Engagement in Group:  Engaged  Additional Comments:  Added a lot to the conversation and listened to others when they shared  [name] A 05/12/2013  

## 2013-05-12 NOTE — Progress Notes (Signed)
Psychoeducational Group Note  Date: 05/12/2013 Time: 0930  Group Topic/Focus:  Making Healthy Choices:   The focus of this group is to help patients identify negative/unhealthy choices they were using prior to admission and identify positive/healthier coping strategies to replace them upon discharge.  Participation Level:  Active  Participation Quality:  Appropriate  Affect:  Appropriate  Cognitive:  Appropriate  Insight:  Engaged  Engagement in Group:  Engaged  Additional Comments:    05/12/2013,7:48 PM Joie Bimler Ludmilla Mcgillis

## 2013-05-12 NOTE — [facility] Group Notes (Signed)
[facility] LCSW Group Therapy No    05/12/2013 1:15PM  Type of Therapy and Topic:  Group Therapy: Stages of Change and Self-Sabotaging, Enabling Behaviors   Participation Level:  Minimal   Mood: Appropriate and attentive  Description of Group:     Learn how to identify obstacles, self-sabotaging and enabling behaviors, what are they, why do we do them and what needs do these behaviors meet? Discuss unhealthy relationships and how to have positive healthy boundaries with those that sabotage and enable. Explore aspects of self-sabotage and enabling in yourself and how to limit these self-destructive behaviors in everyday life.  Therapeutic Goals: 1. Patient will identify one obstacle that relates to self-sabotage and enabling behaviors 2. Patient will identify one personal self-sabotaging or enabling behavior they did prior to admission 3. Patient will demonstrate ability to communicate their needs through discussion and/or role plays. 4. Patient will identify where they are in Stages of Change Diagram   Summary of Patient Progress: The main focus of today's process group was to have patient identify with what "self-sabotage" means to them and use Motivational Interviewing to discuss what benefits, negative or positive, were involved in a self-identified self-sabotaging behavior. We then talked about reasons the patient may want to change the behavior and any current desire to change. Patients then had opportunity to identify where they are on the Stages of Change Cycle diagram discussed in group. [name] shared that he feels he has a fresh start and is now in the action stage.  He shared that substance abuse and procrastination have often stood in his way.    Therapeutic Modalities:   Cognitive Behavioral Therapy Person-Centered Therapy Motivational Interviewing   Carney Bern, LCSW

## 2013-05-12 NOTE — Progress Notes (Signed)
Patient ID: [name], male   DOB: Feb 24, 1965, 48 y.o.   MRN: [id number] D)   Has been pleasant today, interacting appropriately with staff and peers, has spent most of the evening in the dayroom, talking and watching tv.  Attended group, came to med window shortly after to request hs trazodone, voiced no c/o's or needs. A)  Will continue to monitor q 15 minutes for safety, continue POC R)  Safety maintained.

## 2013-05-12 NOTE — Progress Notes (Signed)
[facility] MD Progress Note  05/12/2013 10:10 AM [name]  MRN:  [id number] Subjective:  Pt is a 48 yo male that has had previous admissions at [facility]. Pt states he feels very agitated today 4/10, no improvements since yesterday. He denies any SI/HI/AVH. Currently rates anxiety 4/10, and depression 5/10. He reports going outside and getting fresh air did help him. His compliant with medication and attending group sessions, he has learned new coping skills including deep breathing. Reports sleep disturbance due to racing thoughts, and thinking about his daughter.  Diagnosis:   DSM5: Schizophrenia Disorders:   Obsessive-Compulsive Disorders:   Trauma-Stressor Disorders:  Acute Stress Disorder (308.3) Substance/Addictive Disorders:  Alcohol Intoxication with Use Disorder - Severe (F10.229) Depressive Disorders:  Major Depressive Disorder - Severe (296.23) Total Time spent with patient: 30 minutes  Axis I: Alcohol Abuse, Generalized Anxiety Disorder and Major Depression, Recurrent severe Axis II: Deferred Axis IV: economic problems, occupational problems, other psychosocial or environmental problems, problems related to social environment, problems with access to health care services and problems with primary support group Axis V: 41-50 serious symptoms  ADL's:  Intact  Sleep: Poor  Appetite:  Fair  Suicidal Ideation:  Plan:  Denies Intent:  Denies Means:  Denies Homicidal Ideation:  Plan:  Denies Intent:  Denies Means:  Denies AEB (as evidenced by):  Psychiatric Specialty Exam: Physical Exam  Constitutional: He is oriented to person, place, and time. He appears well-developed.  HENT:  Head: Normocephalic.  Eyes: Pupils are equal, round, and reactive to light.  Neck: Normal range of motion.  Cardiovascular: Normal rate.   Respiratory: Effort normal.  GI: Soft.  Musculoskeletal: Normal range of motion.  Neurological: He is alert and oriented to person, place, and time.  Skin: Skin  is warm and dry.    Review of Systems  Psychiatric/Behavioral: Positive for depression and substance abuse. The patient is nervous/anxious and has insomnia.   All other systems reviewed and are negative.   Blood pressure 140/75, pulse 39, temperature 97.9 F (36.6 C), temperature source Oral, resp. rate 16, height 5\' 9"  (1.753 m), weight 85.276 kg (188 lb), SpO2 98.00%.Body mass index is 27.75 kg/(m^2).  General Appearance: Casual and Neat  Eye Contact::  Good  Speech:  Clear and Coherent and Normal Rate  Volume:  Normal  Mood:  Angry, Anxious, Depressed and Irritable  Affect:  Appropriate and Blunt  Thought Process:  Circumstantial and Irrelevant  Orientation:  Full (Time, Place, and Person)  Thought Content:  WDL  Suicidal Thoughts:  No  Homicidal Thoughts:  No  Memory:  Immediate;   Fair Recent;   Fair Remote;   Fair  Judgement:  Fair  Insight:  Good  Psychomotor Activity:  Restlessness  Concentration:  Good  Recall:  Good  Fund of Knowledge:Fair  Language: Good  Akathisia:  No  Handed:  Right  AIMS (if indicated):     Assets:  Desire for Improvement Financial Resources/Insurance Physical Health Talents/Skills  Sleep:  Number of Hours: 6.5   Musculoskeletal: Strength & Muscle Tone: within normal limits Gait & Station: normal Patient leans: N/A  Current Medications: Current Facility-Administered Medications  Medication Dose Route Frequency Provider Last Rate Last Dose  . acetaminophen (TYLENOL) tablet 650 mg  650 mg Oral Q6H PRN Verne Spurr, PA-C      . alum & mag hydroxide-simeth (MAALOX/MYLANTA) 200-200-20 MG/5ML suspension 30 mL  30 mL Oral Q4H PRN Verne Spurr, PA-C      . amiodarone (PACERONE) tablet 200  mg  200 mg Oral BID Verne SpurrNeil Mashburn, PA-C   200 mg at 05/12/13 [id number]  . apixaban (ELIQUIS) tablet 5 mg  5 mg Oral BID Verne SpurrNeil Mashburn, PA-C   5 mg at 05/12/13 0841  . aspirin tablet 650 mg  650 mg Oral Q6H PRN Verne SpurrNeil Mashburn, PA-C      . feeding supplement  (ENSURE COMPLETE) (ENSURE COMPLETE) liquid 237 mL  237 mL Oral BID BM Jeoffrey MassedLaura Lee Jobe, RD   237 mL at 05/11/13 1506  . FLUoxetine (PROZAC) capsule 20 mg  20 mg Oral Daily Nehemiah SettleJanardhaha R Jonnalagadda, MD   20 mg at 05/12/13 0842  . magnesium hydroxide (MILK OF MAGNESIA) suspension 30 mL  30 mL Oral Daily PRN Verne SpurrNeil Mashburn, PA-C      . metoprolol tartrate (LOPRESSOR) tablet 12.5 mg  12.5 mg Oral BID Verne SpurrNeil Mashburn, PA-C   12.5 mg at 05/12/13 0841  . multivitamin with minerals tablet 1 tablet  1 tablet Oral Daily Jeoffrey MassedLaura Lee Jobe, RD   1 tablet at 05/12/13 [phone]  . traZODone (DESYREL) tablet 50 mg  50 mg Oral QHS Nehemiah SettleJanardhaha R Jonnalagadda, MD   50 mg at 05/11/13 2150    Lab Results: No results found for this or any previous visit (from the past 48 hour(s)).  Physical Findings: AIMS: Facial and Oral Movements Muscles of Facial Expression: None, normal Lips and Perioral Area: None, normal Jaw: None, normal Tongue: None, normal,Extremity Movements Upper (arms, wrists, hands, fingers): None, normal Lower (legs, knees, ankles, toes): None, normal, Trunk Movements Neck, shoulders, hips: None, normal, Overall Severity Severity of abnormal movements (highest score from questions above): None, normal Incapacitation due to abnormal movements: None, normal Patient's awareness of abnormal movements (rate only patient's report): No Awareness, Dental Status Current problems with teeth and/or dentures?: No Does patient usually wear dentures?: No  CIWA:  CIWA-Ar Total: 0 COWS:     Treatment Plan Summary: Daily contact with patient to assess and evaluate symptoms and progress in treatment Medication management  Plan: Review of chart, vital signs, medications, and notes.  1-Admit for crisis management and stabilization. Estimated length of stay 5-7 days past his current stay of 1. 2-Individual and group therapy encouraged  3-Medication management for depression and anxiety to reduce current symptoms to base  line and improve the patient's overall level of functioning: Medications reviewed with the patient. Will add Zyprexa 5 mg for prn agitation.  4-Coping skills for depression, substance abuse, anger issues, and anxiety developing--  5-Continue crisis stabilization and management  6-Address health issues--monitoring vital signs, stable  7-Treatment plan in progress to prevent relapse of depression, angry outbursts, and anxiety  8-Psychosocial education regarding relapse prevention and self-care  9-Health care follow up as needed for any health concerns  10-Call for consult with hospitalist for additional specialty patient services as needed.   Medical Decision Making Problem Points:  Established problem, worsening (2), Review of last therapy session (1) and Review of psycho-social stressors (1) Data Points:  Review or order clinical lab tests (1) Review of medication regiment & side effects (2) Review of new medications or change in dosage (2)  I certify that inpatient services furnished can reasonably be expected to improve the patient's condition.   Juel Burrowakia S Starkes FNP-BC 05/12/2013, 10:10 AM

## 2013-05-13 NOTE — Progress Notes (Signed)
Patient ID: [name], male   DOB: 12-Jul-1965, 48 y.o.   MRN: [id number] [facility] MD Progress Note  05/13/2013 1:55 PM Kaian Fahs  MRN:  [id number] Subjective:  Met with [name] today to discuss his progress and response to treatment. He notes that he feels about the same as he did upon arrival but can admit that he is some better.  He says his depression is an 8/10, but was less depressed earlier with the group, and he notes that his anxiety is a 1/10. He feels better now that he is doing something about his depression and is interested in going to grief therapy once he completes rehab. He says he could stay with his employer if he can not go directly to a rehab facility from [facility]. Diagnosis:   DSM5: Schizophrenia Disorders:   Obsessive-Compulsive Disorders:   Trauma-Stressor Disorders:  Acute Stress Disorder (308.3) Substance/Addictive Disorders:  Alcohol Intoxication with Use Disorder - Severe (F10.229) Depressive Disorders:  Major Depressive Disorder - Severe (296.23) Total Time spent with patient: 30 minutes  Axis I: Alcohol Abuse, Generalized Anxiety Disorder and Major Depression, Recurrent severe Axis II: Deferred Axis IV: economic problems, occupational problems, other psychosocial or environmental problems, problems related to social environment, problems with access to health care services and problems with primary support group Axis V: 41-50 serious symptoms  ADL's:  Intact  Sleep: Poor  Appetite:  Fair  Suicidal Ideation:  Plan:  Denies Intent:  Denies Means:  Denies Homicidal Ideation:  Plan:  Denies Intent:  Denies Means:  Denies AEB (as evidenced by):  Psychiatric Specialty Exam: Physical Exam  Constitutional: He is oriented to person, place, and time. He appears well-developed.  HENT:  Head: Normocephalic.  Eyes: Pupils are equal, round, and reactive to light.  Neck: Normal range of motion.  Cardiovascular: Normal rate.   Respiratory: Effort normal.  GI: Soft.   Musculoskeletal: Normal range of motion.  Neurological: He is alert and oriented to person, place, and time.  Skin: Skin is warm and dry.    Review of Systems  Psychiatric/Behavioral: Positive for depression and substance abuse. The patient is nervous/anxious and has insomnia.   All other systems reviewed and are negative.   Blood pressure 138/71, pulse 52, temperature 97.8 F (36.6 C), temperature source Oral, resp. rate 18, height _0  (1.753 m), weight 85.276 kg (188 lb), SpO2 98.00%.Body mass index is 27.75 kg/(m^2).  General Appearance: Casual and Neat  Eye Contact::  Good  Speech:  Clear and Coherent and Normal Rate  Volume:  Normal  Mood:  Angry, Anxious, Depressed and Irritable  Affect:  Appropriate and Blunt  Thought Process:  Circumstantial and Irrelevant  Orientation:  Full (Time, Place, and Person)  Thought Content:  WDL  Suicidal Thoughts:  No  Homicidal Thoughts:  No  Memory:  Immediate;   Fair Recent;   Fair Remote;   Fair  Judgement:  Fair  Insight:  Good  Psychomotor Activity:  Restlessness  Concentration:  Good  Recall:  Good  Fund of Knowledge:Fair  Language: Good  Akathisia:  No  Handed:  Right  AIMS (if indicated):     Assets:  Desire for Improvement Financial Resources/Insurance Physical Health Talents/Skills  Sleep:  Number of Hours: 6   Musculoskeletal: Strength & Muscle Tone: within normal limits Gait & Station: normal Patient leans: N/A  Current Medications: Current Facility-Administered Medications  Medication Dose Route Frequency Provider Last Rate Last Dose  . acetaminophen (TYLENOL) tablet 650 mg  650 mg Oral  Q6H PRN Nena Polio, PA-C      . alum & mag hydroxide-simeth (MAALOX/MYLANTA) 200-200-20 MG/5ML suspension 30 mL  30 mL Oral Q4H PRN Nena Polio, PA-C      . amiodarone (PACERONE) tablet 200 mg  200 mg Oral BID Nena Polio, PA-C   200 mg at 05/13/13 0755  . apixaban (ELIQUIS) tablet 5 mg  5 mg Oral BID Nena Polio, PA-C    5 mg at 05/13/13 0755  . aspirin tablet 650 mg  650 mg Oral Q6H PRN Nena Polio, PA-C      . feeding supplement (ENSURE COMPLETE) (ENSURE COMPLETE) liquid 237 mL  237 mL Oral BID BM Darrol Jump, RD   237 mL at 05/13/13 1324  . FLUoxetine (PROZAC) capsule 20 mg  20 mg Oral Daily Durward Parcel, MD   20 mg at 05/13/13 0755  . magnesium hydroxide (MILK OF MAGNESIA) suspension 30 mL  30 mL Oral Daily PRN Nena Polio, PA-C      . metoprolol tartrate (LOPRESSOR) tablet 12.5 mg  12.5 mg Oral BID Nena Polio, PA-C   12.5 mg at 05/13/13 0755  . multivitamin with minerals tablet 1 tablet  1 tablet Oral Daily Darrol Jump, RD   1 tablet at 05/13/13 0755  . OLANZapine zydis (ZYPREXA) disintegrating tablet 5 mg  5 mg Oral BID PRN Nanci Pina, FNP      . traZODone (DESYREL) tablet 50 mg  50 mg Oral QHS Durward Parcel, MD   50 mg at 05/12/13 2233    Lab Results: No results found for this or any previous visit (from the past 48 hour(s)).  Physical Findings: AIMS: Facial and Oral Movements Muscles of Facial Expression: None, normal Lips and Perioral Area: None, normal Jaw: None, normal Tongue: None, normal,Extremity Movements Upper (arms, wrists, hands, fingers): None, normal Lower (legs, knees, ankles, toes): None, normal, Trunk Movements Neck, shoulders, hips: None, normal, Overall Severity Severity of abnormal movements (highest score from questions above): None, normal Incapacitation due to abnormal movements: None, normal Patient's awareness of abnormal movements (rate only patient's report): No Awareness, Dental Status Current problems with teeth and/or dentures?: No Does patient usually wear dentures?: No  CIWA:  CIWA-Ar Total: 0 COWS:     Treatment Plan Summary: Daily contact with patient to assess and evaluate symptoms and progress in treatment Medication management  Plan: Review of chart, vital signs, medications, and notes.  1. Continue current plan  of care with no changes at this time. 2. Dispo is in process with a goal of discharge in 24-48 hours.  Medical Decision Making Problem Points:  Established problem, worsening (2), Review of last therapy session (1) and Review of psycho-social stressors (1) Data Points:  Review or order clinical lab tests (1) Review of medication regiment & side effects (2) Review of new medications or change in dosage (2)  I certify that inpatient services furnished can reasonably be expected to improve the patient's condition.   Marlane Hatcher. Mashburn RPAC 2:00 PM 05/13/2013  Reviewed the information documented and agree with the treatment plan.  Parke Simmers Ayvah Caroll 05/14/2013 12:44 PM

## 2013-05-13 NOTE — [facility] Group Notes (Signed)
[facility] LCSW Group Therapy          Overcoming Obstacles       1:15 -2:30        05/13/2013       Type of Therapy:  Group Therapy  Participation Level:  Appropriate  Participation Quality:  Appropriate  Affect:  Appropriate, Alert  Cognitive:  Attentive Appropriate  Insight: Developing/Improving Engaged  Engagement in Therapy: Developing/Imprvoing Engaged  Modes of Intervention:  Discussion Exploration  Education Rapport BuildingProblem-Solving Support  Summary of Progress/Problems:  The main focus of today's group was overcoming obstacles.  Patient shared his obstacle is alcohol.  He is hoping to get into [facility].   Patient able to identify appropriate coping skills.   Wynn Banker 05/13/2013

## 2013-05-13 NOTE — Progress Notes (Signed)
D) Pt rates his depression at a 6 and his hopelessness at a 5. Wants to go to [name] so that he can continue to work on his sobriety. Feels his only support is his boss. Denies SI and HI.  A) Given support, reassurance and praise. Encouragement given. R) Denies SI and HI presently.

## 2013-05-13 NOTE — Progress Notes (Signed)
D: pt stated she had a pretty good day today. Denies si/hi/avh. denies pain. Pt stated he just feels really depressed and cant seem to get out of his slump. Pt stated he just keeps thinking about the loss of his daughter and doesn't know how to get over it. Pt is calm and cooperative. Fair eye contact. Flat affect.  A: q 15 min safety checks. 1:1 time given. scheduled medications given R: pt remains safe on unit. No further signs of distress or complaints at this time

## 2013-05-13 NOTE — Progress Notes (Signed)
Patient ID: [name], male   DOB: 1965/05/08, 48 y.o.   MRN: [id number] D)   Has been in the dayroom most of the evening, laughing and interacting with select peers,  Seems especially friendly with male peers but has been appropriate.  Attended and participated in group.  Stayed in the dayroom afterward for a game of cards before going to bed. Denied thoughts of self harm. A)  Will continue to monitor q 15 minutes for safety, continue POC R)  Safety maintained.

## 2013-05-13 NOTE — Progress Notes (Signed)
Adult Psychoeducational Group Note  Date:  05/13/2013 Time:  10:21 PM  Group Topic/Focus:  Goals Group:   The focus of this group is to help patients establish daily goals to achieve during treatment and discuss how the patient can incorporate goal setting into their daily lives to aide in recovery.  Participation Level:  Active  Participation Quality:  Appropriate  Affect:  Appropriate  Cognitive:  Appropriate  Insight: Appropriate  Engagement in Group:  Engaged  Modes of Intervention:  Discussion  Additional Comments:  Pt stated that his day is good, and is excited about going to a treatment facility when released.  Aldona Lento 05/13/2013, 10:21 PM

## 2013-05-14 NOTE — Progress Notes (Signed)
Patient ID: Robert OchsJohn [name], male   DOB: 03/25/1965, 48 y.o.   MRN: [id number] Patient ID: Robert OchsJohn [name], male   DOB: 09/02/1965, 48 y.o.   [id number] [facility]  05/14/2013 1:00 PM Robert OchsJohn [name]  MRN:  [id number]  Subjective:  Patient was seen for his progress and disposition plans. This was discussed with case manager who has been working on substance abuse rehabilitation placement. Patient has no current symptoms of alcohol withdrawal or cravings. Patient reported he has been getting somewhat better but continued to endorse symptoms of depression, anxiety but no suicidal ideation at this time. Patient rates depression is an 7/10,  and  anxiety is a 5/10. He feels better now that he is doing something about his depression and is interested in going to grief therapy once he completes rehab. He says he could stay with his employer if he can not [facility].  Diagnosis:   DSM5: Schizophrenia Disorders:   Obsessive-Compulsive Disorders:   Trauma-Stressor Disorders:  Acute Stress Disorder (308.3) Substance/Addictive Disorders:  Alcohol Intoxication with Use Disorder - Severe (F10.229) Depressive Disorders:  Major Depressive Disorder - Severe (296.23) Total Time spent with patient: 30 minutes  Axis I: Alcohol Abuse, Generalized Anxiety Disorder and Major Depression, Recurrent severe Axis II: Deferred Axis IV: economic problems, occupational problems, other psychosocial or environmental problems, problems related to social environment, problems with access to health care services and problems with primary support group Axis V: 41-50 serious symptoms  ADL's:  Intact  Sleep: Poor  Appetite:  Fair  Suicidal Ideation:  Plan:  Denies Intent:  Denies Means:  Denies Homicidal Ideation:  Plan:  Denies Intent:  Denies Means:  Denies AEB (as evidenced by):  Psychiatric Specialty Exam: Physical Exam  Constitutional: He is oriented to person, place, and  time. He appears well-developed.  HENT:  Head: Normocephalic.  Eyes: Pupils are equal, round, and reactive to light.  Neck: Normal range of motion.  Cardiovascular: Normal rate.   Respiratory: Effort normal.  GI: Soft.  Musculoskeletal: Normal range of motion.  Neurological: He is alert and oriented to person, place, and time.  Skin: Skin is warm and dry.    Review of Systems  Psychiatric/Behavioral: Positive for depression and substance abuse. The patient is nervous/anxious and has insomnia.   All other systems reviewed and are negative.   Blood pressure 108/65, pulse 108, temperature 98 F (36.7 C), temperature source Oral, resp. rate 16, height 5\' 9"  (1.753 m), weight 85.276 kg (188 lb), SpO2 98.00%.Body mass index is 27.75 kg/(m^2).  General Appearance: Casual and Neat  Eye Contact::  Good  Speech:  Clear and Coherent and Normal Rate  Volume:  Normal  Mood:  Angry, Anxious, Depressed and Irritable  Affect:  Appropriate and Blunt  Thought Process:  Circumstantial and Irrelevant  Orientation:  Full (Time, Place, and Person)  Thought Content:  WDL  Suicidal Thoughts:  No  Homicidal Thoughts:  No  Memory:  Immediate;   Fair Recent;   Fair Remote;   Fair  Judgement:  Fair  Insight:  Good  Psychomotor Activity:  Restlessness  Concentration:  Good  Recall:  Good  Fund of Knowledge:Fair  Language: Good  Akathisia:  No  Handed:  Right  AIMS (if indicated):     Assets:  Desire for Improvement Financial Resources/Insurance Physical Health Talents/Skills  Sleep:  Number of Hours: 5   Musculoskeletal: Strength & Muscle Tone: within normal limits Gait & Station: normal Patient  leans: N/A  Current Medications: Current Facility-Administered Medications  Medication Dose Route Frequency Provider Last Rate Last Dose  . acetaminophen (TYLENOL) tablet 650 mg  650 mg Oral Q6H PRN Verne Spurr, PA-C      . alum & mag hydroxide-simeth (MAALOX/MYLANTA) 200-200-20 MG/5ML  suspension 30 mL  30 mL Oral Q4H PRN Verne Spurr, PA-C      . amiodarone (PACERONE) tablet 200 mg  200 mg Oral BID Verne Spurr, PA-C   200 mg at 05/14/13 0907  . apixaban (ELIQUIS) tablet 5 mg  5 mg Oral BID Verne Spurr, PA-C   5 mg at 05/14/13 0848  . aspirin tablet 650 mg  650 mg Oral Q6H PRN Verne Spurr, PA-C      . feeding supplement (ENSURE COMPLETE) (ENSURE COMPLETE) liquid 237 mL  237 mL Oral BID BM Jeoffrey Massed, RD   237 mL at 05/14/13 0850  . FLUoxetine (PROZAC) capsule 20 mg  20 mg Oral Daily Nehemiah Settle, MD   20 mg at 05/14/13 0848  . magnesium hydroxide (MILK OF MAGNESIA) suspension 30 mL  30 mL Oral Daily PRN Verne Spurr, PA-C      . metoprolol tartrate (LOPRESSOR) tablet 12.5 mg  12.5 mg Oral BID Verne Spurr, PA-C   12.5 mg at 05/14/13 0848  . multivitamin with minerals tablet 1 tablet  1 tablet Oral Daily Jeoffrey Massed, RD   1 tablet at 05/14/13 0847  . OLANZapine zydis (ZYPREXA) disintegrating tablet 5 mg  5 mg Oral BID PRN Truman Hayward, FNP      . traZODone (DESYREL) tablet 50 mg  50 mg Oral QHS Nehemiah Settle, MD   50 mg at 05/13/13 2118    Lab Results: No results found for this or any previous visit (from the past 48 hour(s)).  Physical Findings: AIMS: Facial and Oral Movements Muscles of Facial Expression: None, normal Lips and Perioral Area: None, normal Jaw: None, normal Tongue: None, normal,Extremity Movements Upper (arms, wrists, hands, fingers): None, normal Lower (legs, knees, ankles, toes): None, normal, Trunk Movements Neck, shoulders, hips: None, normal, Overall Severity Severity of abnormal movements (highest score from questions above): None, normal Incapacitation due to abnormal movements: None, normal Patient's awareness of abnormal movements (rate only patient's report): No Awareness, Dental Status Current problems with teeth and/or dentures?: No Does patient usually wear dentures?: No  CIWA:  CIWA-Ar Total:  0 COWS:     Treatment Plan Summary: Daily contact with patient to assess and evaluate symptoms and progress in treatment Medication management  Plan: Review of chart, vital signs, medications, and notes.  1. Continue current plan of care with no changes at this time. 2. Disposition plans are in progress and may be discharged to substance abuse treatment program tomorrow if bed is available.   Medical Decision Making Problem Points:  Established problem, worsening (2), Review of last therapy session (1) and Review of psycho-social stressors (1) Data Points:  Review or order clinical lab tests (1) Review of medication regiment & side effects (2) Review of new medications or change in dosage (2)  I certify that inpatient services furnished can reasonably be expected to improve the patient's condition.   Randal Buba Marnesha Gagen 05/14/2013 1:00 PM

## 2013-05-14 NOTE — [facility] Group Notes (Signed)
[facility] LCSW Group Therapy  05/14/2013   1:15 PM   Type of Therapy:  Group Therapy  Participation Level:  Active  Participation Quality:  Attentive, Sharing and Supportive  Affect:  Depressed and Flat  Cognitive:  Alert and Oriented  Insight:  Developing/Improving and Engaged  Engagement in Therapy:  Developing/Improving and Engaged  Modes of Intervention:  Activity, Clarification, Confrontation, Discussion, Education, Exploration, Limit-setting, Orientation, Problem-solving, Rapport Building, Reality Testing, Socialization and Support  Summary of Progress/Problems: Patient was attentive and engaged with speaker from [employer].  Patient was attentive to speaker while they shared their story of dealing with mental health and overcoming it.  Patient expressed interest in their programs and services and received information on their agency.  Patient processed ways they can relate to the speaker.     Yemaya Barnier Horton, LCSW 05/14/2013  1:55 PM       

## 2013-05-14 NOTE — Progress Notes (Signed)
Recreation Therapy Notes  Animal-Assisted Activity/Therapy (AAA/T) Program Checklist/Progress Notes Patient Eligibility Criteria Checklist & Daily Group note for Rec Tx Intervention  Date: 04.14.2015 Time: 2:45am Location: [address]    AAA/T Program Assumption of Risk Form signed by Patient/ or Parent Legal Guardian yes  Patient is free of allergies or sever asthma yes  Patient reports no fear of animals yes  Patient reports no history of cruelty to animals yes   Patient understands his/her participation is voluntary yes  Patient washes hands before animal contact yes  Patient washes hands after animal contact yes  Behavioral Response: Engaged, Appropriate   Education: Hand Washing, Appropriate Animal Interaction   Education Outcome: Acknowledges understanding  Clinical Observations/Feedback: Patient interacted appropriately with therapy dog team.    Jearl Klinefelter, LRT/CTRS  Ayodele Sangalang L Stiles Maxcy 05/14/2013 4:50 PM

## 2013-05-14 NOTE — Progress Notes (Signed)
Patient ID: [name], male   DOB: [birth year], 48 y.o.   MRN: [id number] He has been up and to groups interacting with peers and staff. Has not done his self inventory. He continues to be flat, depressed. Denies SI thoughts.Has not requested any prn medication.

## 2013-05-14 NOTE — Progress Notes (Signed)
D: Patient resting in bed with eyes closed.  Respirations even and unlabored.  Patient appears to be in no apparent distress. A: Staff to monitor Q 15 mins for safety.   R:Patient remains safe on the unit.  

## 2013-05-15 DIAGNOSIS — F3289 Other specified depressive episodes: Secondary | ICD-10-CM

## 2013-05-15 DIAGNOSIS — F329 Major depressive disorder, single episode, unspecified: Secondary | ICD-10-CM

## 2013-05-15 MED ORDER — AMIODARONE HCL 400 MG PO TABS: 200.0000 mg | ORAL_TABLET | Freq: Two times a day (BID) | ORAL | Status: DC

## 2013-05-15 MED ORDER — METOPROLOL TARTRATE 25 MG PO TABS: 12.5000 mg | ORAL_TABLET | Freq: Two times a day (BID) | ORAL | Status: DC

## 2013-05-15 MED ORDER — FLUOXETINE HCL 20 MG PO CAPS: 20.0000 mg | ORAL_CAPSULE | Freq: Every day | ORAL | Status: DC

## 2013-05-15 MED ORDER — APIXABAN 5 MG PO TABS: 5.0000 mg | ORAL_TABLET | Freq: Two times a day (BID) | ORAL | Status: DC

## 2013-05-15 MED ORDER — TRAZODONE HCL 50 MG PO TABS: 50.0000 mg | ORAL_TABLET | Freq: Every day | ORAL | Status: DC

## 2013-05-15 NOTE — [facility] Suicide Risk Assessment (Signed)
[facility] INPATIENT:  Family/Significant Other Suicide Prevention Education  Suicide Prevention Education:  Contact Attempts:  Addison Bailey, [phone]; has been identified by the patient as the family member/significant other with whom the patient will be residing, and identified as the person(s) who will aid the patient in the event of a mental health crisis.  With written consent from the patient, two attempts were made to provide suicide prevention education, prior to and/or following the patient's discharge.  We were unsuccessful in providing suicide prevention education.  A suicide education pamphlet was given to the patient to share with family/significant other.  Date and time of first attempt: 05/14/13 Date and time of second attempt: 05/15/13  Joesph July Kiaria Quinnell 05/15/2013, 8:28 AM

## 2013-05-15 NOTE — Progress Notes (Signed)
[facility] Adult Case Management Discharge Plan :  Will you be returning to the same living situation after discharge: Yes,  Patient is returning to his home. At discharge, do you have transportation home?:Yes,  Patient to arrange transportation. Do you have the ability to pay for your medications:No. Patient will be assisted with indigent medications.   Release of information consent forms completed and in the chart;  Patient's signature needed at discharge.  Patient to Follow up at: Follow-up Information   Follow up with [facility] On 05/16/2013. (Please go to [facility]'s walk in clinic on Thursday, May 16, 2013 or any weekday between 8AM - 4 PM)    Contact information:   [address] [place], [place]   [address]  [phone]      Patient denies SI/HI:   Patient no longer endorsing SI/HI or other thoughts of self harm.  Safety Planning and Suicide Prevention discussed:  .Reviewed with all patients during discharge planning group  Nathasha Fiorillo Hairston Seymour Pavlak 05/15/2013, 4:05 PM

## 2013-05-15 NOTE — Progress Notes (Signed)
Follow up with pt for continued support around grief and loss.  Pt was planning on discharging today to [facility].  Reports he was turned down at [facility] due to insufficient length of time since last stay.   [name] has spoke with his boss and states that he is able to stay there -- spoke with chaplain about ability to get support he needs while staying at boss's house, as boss had been pressuring him to come back to work.  [name] feels due to recent conversations he has had with boss, this will be a supportive place for him.   Encouraged continued participation in groups outside of [facility] - especially AA and [facility] grief group.   Will follow up with contact information for grief groups in area.   [name] MDiv  [phone]

## 2013-05-15 NOTE — Discharge Summary (Signed)
Physician Discharge Summary Note  Patient:  Robert OchsJohn [name] is an 48 y.o., male MRN:  [id number] DOB:  08/04/1965 Patient phone:  [phone] (home)  Patient address:   [address] [place] [address],  Total Time spent with patient: 30 minutes  Date of Admission:  05/09/2013 Date of Discharge: 05/15/2013  Reason for Admission:  Alcoholism  Discharge Diagnoses: Active Problems:   Alcohol dependence   Depression   Psychiatric Specialty Exam:  Please see D/C SRA  Physical Exam  ROS  Blood pressure 118/83, pulse 80, temperature 97.8 F (36.6 C), temperature source Oral, resp. rate 17, height 5\' 9"  (1.753 m), weight 85.276 kg (188 lb), SpO2 98.00%.Body mass index is 27.75 kg/(m^2).  General Appearance:   Eye Contact::    Speech:    Volume:    Mood:    Affect:    Thought Process:    Orientation:    Thought Content:    Suicidal Thoughts:    Homicidal Thoughts:    Memory:    Judgement:    Insight:    Psychomotor Activity:    Concentration:    Recall:    Fund of Knowledge:  Language:   Akathisia:    Handed:    AIMS (if indicated):     Assets:    Sleep:  Number of Hours: 5    Past Psychiatric History: Diagnosis:  Hospitalizations:  Outpatient Care:  Substance Abuse Care:  Self-Mutilation:  Suicidal Attempts:  Violent Behaviors:   Musculoskeletal: Strength & Muscle Tone:  Gait & Station:  Patient leans:   DSM5:  Schizophrenia Disorders:   Obsessive-Compulsive Disorders:   Trauma-Stressor Disorders:   Substance/Addictive Disorders:   Depressive Disorders:    Axis Diagnosis:   Discharge Diagnoses:  AXIS I: Generalized Anxiety Disorder and Substance Induced Mood Disorder  AXIS II: Deferred  AXIS III:  Past Medical History   Diagnosis  Date   .  Hypertension    .  Alcoholism /alcohol abuse    .  Anxiety and depression    .  Tobacco abuse     AXIS IV: other psychosocial or environmental problems, problems related to social environment and  problems with primary support group  AXIS V: 61-70 mild symptoms Level of Care:  OP  Hospital Course:  Robert OchsJohn [name] is a 48 year old WM who is well [facility] due to multiple admissions for alcohol detox.  He was admitted from the medical floor after he presented to the ED requesting detox and was found to be in AFib.  After detoxing on the medical unit, he expressed suicidal ideation and [facility]       Upon admission to the adult unit, Robert RuizJohn was evaluated and medication management was initiated with Prozac for his depression and Trazodone for insomnia. He was continued on all medications started on the med floor.      Verdell was encouraged to participate in unit programming and expressed desire to go in to alcohol rehab [facility].   He was also active in working with the LCSW on grief counseling as well. He was unable to go directly into rehab, but would be able to stay with his uncle until a rehab bed became available.       By the day of discharge Robert RuizJohn was feeling much better, denied any SI/HI [facility]. He was motivated to return to rehab and to continue taking his medication.  Consults:  None  Significant Diagnostic Studies:  None  Discharge Vitals:   Blood pressure 118/83, pulse 80, temperature 97.8 F (36.6 C), temperature source Oral, resp. rate 17, height 5\' 9"  (1.753 m), weight 85.276 kg (188 lb), SpO2 98.00%. Body mass index is 27.75 kg/(m^2). Lab Results:   No results found for this or any previous visit (from the past 72 hour(s)).  Physical Findings: AIMS: Facial and Oral Movements Muscles of Facial Expression: None, normal Lips and Perioral Area: None, normal Jaw: None, normal Tongue: None, normal,Extremity Movements Upper (arms, wrists, hands, fingers): None, normal Lower (legs, knees, ankles, toes): None, normal, Trunk Movements Neck, shoulders, hips: None, normal, Overall Severity Severity of abnormal movements (highest score from  questions above): None, normal Incapacitation due to abnormal movements: None, normal Patient's awareness of abnormal movements (rate only patient's report): No Awareness, Dental Status Current problems with teeth and/or dentures?: No Does patient usually wear dentures?: No  CIWA:  CIWA-Ar Total: 0 COWS:     Psychiatric Specialty Exam: See Psychiatric Specialty Exam and Suicide Risk Assessment completed by Attending Physician prior to discharge.  Discharge destination:  Home  Is patient on multiple antipsychotic therapies at discharge:  No   Has Patient had three or more failed trials of antipsychotic monotherapy by history:  No  Recommended Plan for Multiple Antipsychotic Therapies: NA     Medication List    ASK your doctor about these medications     Indication   amiodarone 400 MG tablet  Commonly known as:  PACERONE  Take 0.5 tablets (200 mg total) by mouth 2 (two) times daily.      apixaban 5 MG Tabs tablet  Commonly known as:  ELIQUIS  Take 1 tablet (5 mg total) by mouth 2 (two) times daily.      aspirin 325 MG tablet  Take 650 mg by mouth every 6 (six) hours as needed (pain).      metoprolol tartrate 25 MG tablet  Commonly known as:  LOPRESSOR  Take 0.5 tablets (12.5 mg total) by mouth 2 (two) times daily.            Follow-up Information   Follow up with [facility] On 05/16/2013. (Please go to [facility]'s walk in clinic on Thursday, May 16, 2013 or any weekday between 8AM - 4 PM)    Contact information:   [address] [place], [place]   [address]  [phone]      Follow-up recommendations:   Activities: Resume activity as tolerated. Diet: Heart healthy low sodium diet Tests: Follow up testing will be determined by your out patient provider. Comments:    Total Discharge Time:  Less than 30 minutes.  Signed: Verne Spurr 05/15/2013, 12:11 PM  Patient was seen face-to-face for psychiatric evaluation, suicide risk assessment, case discussed with  the treatment team and a physician extender. Made appropriate disposition plan. Reviewed the information documented and agree with the treatment plan.  Randal Buba Raynah Gomes 05/31/2013 8:06 PM

## 2013-05-15 NOTE — Progress Notes (Signed)
Pt. Discharged per MD orders;  PT. Currently denies any HI/SI or AVH.  Pt. Was given education regarding follow up appointments and medications by RN.  Pt. Denies any questions or concerns about the medications.  Pt. Was escorted to the search room to retrieve his belongings by RN before being discharged to the hospital lobby.  

## 2013-05-15 NOTE — Progress Notes (Signed)
Patient ID: [name], male   DOB: 01-12-1966, 48 y.o.   MRN: [id number]  D: Pt was vague when discussing his adm. Stated that he's going thru difficult times and loss issues. Pt denied having any questions or concerns.   A:  Support and encouragement was offered. 15 min checks continued for safety.  R: Pt remains safe.

## 2013-05-15 NOTE — Progress Notes (Signed)
Adult Psychoeducational Group Note  Date:  05/15/2013 Time:  10:00am Group Topic/Focus:  Personal Choices and Values:   The focus of this group is to help patients assess and explore the importance of values in their lives, how their values affect their decisions, how they express their values and what opposes their expression.  Participation Level:  Active  Participation Quality:  Appropriate and Attentive  Affect:  Appropriate  Cognitive:  Alert and Appropriate  Insight: Appropriate and Good  Engagement in Group:  Engaged  Modes of Intervention:  Discussion and Education  Additional Comments: Pt attended and participated in group. Discussion was on personal development. Pt was asked what did personal development to you? Pt stated it means to do better,do the right things,not hanging around with the wrong people and get in a structure environment.  Pryor Curia 05/15/2013, 1:52 PM

## 2013-05-15 NOTE — Tx Team (Signed)
Interdisciplinary Treatment Plan Update   Date Reviewed:  05/15/2013  Time Reviewed:  9:48 AM  Progress in Treatment:   Attending groups: Yes Participating in groups: Yes Taking medication as prescribed: Yes  Tolerating medication: Yes Family/Significant other contact made:  No,unable to make contact with patient's friend. Patient understands diagnosis: Yes  Discussing patient identified problems/goals with staff: Yes Medical problems stabilized or resolved: Yes Denies suicidal/homicidal ideation: Yes Patient has not harmed self or others: Yes  For review of initial/current patient goals, please see plan of care.  Estimated Length of Stay:  Discharge today  Reasons for Continued Hospitalization:   New Problems/Goals identified:    Discharge Plan or Barriers:   Home with outpatient follow up at [facility]  Additional Comments:    Attendees:  Patient:  05/15/2013 9:48 AM   Signature: Mervyn Gay, MD 05/15/2013 9:48 AM  Signature:  Verne Spurr, PA 05/15/2013 9:48 AM  Signature:  Nestor Ramp, RN 05/15/2013 9:48 AM  Signature:  Norva Karvonen, RN 05/15/2013 9:48 AM  Signature:  Salvatore Marvel, RN 05/15/2013 9:48 AM  Signature:  Juline Patch, LCSW 05/15/2013 9:48 AM  Signature:  Chad Cordial Transition Team 05/15/2013 9:48 AM  Signature:  Reyes Ivan, LCSW 05/15/2013 9:48 AM  Signature:   05/15/2013 9:48 AM  Signature:  05/15/2013  9:48 AM  Signature:   Onnie Boer, RN [facility] 05/15/2013  9:48 AM  Signature:  05/15/2013  9:48 AM    Scribe for Treatment Team:   Juline Patch,  05/15/2013 9:48 AM

## 2013-05-15 NOTE — [facility] Group Notes (Signed)
[facility] LCSW Aftercare Discharge Planning Group Note   05/15/2013 9:57 AM    Participation Quality:  Appropraite  Mood/Affect:  Appropriate  Depression Rating:  6  Anxiety Rating:  4  Thoughts of Suicide:  No  Will you contract for safety?   NA  Current AVH:  No  Plan for Discharge/Comments:  Patient attended discharge planning group and actively participated in group.  He will follow up with [facility].  CSW provided all participants with daily workbook.   Transportation Means: Patient has transportation.   Supports:  Patient has a support system.   Alinna Siple, Joesph July

## 2013-05-15 NOTE — [facility] Suicide Risk Assessment (Signed)
   Demographic Factors:  Male  Total Time spent with patient: 30 minutes  Psychiatric Specialty Exam: Physical Exam  ROS  Blood pressure 118/83, pulse 80, temperature 97.8 F (36.6 C), temperature source Oral, resp. rate 17, height 5\' 9"  (1.753 m), weight 85.276 kg (188 lb), SpO2 98.00%.Body mass index is 27.75 kg/(m^2).  General Appearance: Casual  Eye Contact::  Good  Speech:  Clear and Coherent  Volume:  Normal  Mood:  Euthymic  Affect:  Appropriate and Congruent  Thought Process:  Coherent and Goal Directed  Orientation:  Full (Time, Place, and Person)  Thought Content:  WDL  Suicidal Thoughts:  No  Homicidal Thoughts:  No  Memory:  Immediate;   Good  Judgement:  Good  Insight:  Good  Psychomotor Activity:  Normal  Concentration:  Good  Recall:  Good  Fund of Knowledge:Good  Language: NA  Akathisia:  No  Handed:  Right  AIMS (if indicated):     Assets:  Communication Skills Desire for Improvement Financial Resources/Insurance Housing Leisure Time Physical Health Resilience Social Support Talents/Skills Transportation  Sleep:  Number of Hours: 5    Musculoskeletal: Strength & Muscle Tone: within normal limits Gait & Station: normal Patient leans: N/A   Mental Status Per Nursing Assessment::   On Admission:     Current Mental Status by Physician: he has good mood and bright affect and well groomed and denied suicidal or homicidal ideation, intention or plans.  Loss Factors: Loss of significant relationship and Financial problems/change in socioeconomic status  Historical Factors: Prior suicide attempts, Family history of mental illness or substance abuse and Impulsivity  Risk Reduction Factors:   Sense of responsibility to family, Religious beliefs about death, Employed, Positive social support, Positive therapeutic relationship and Positive coping skills or problem solving skills  Continued Clinical Symptoms:  Depression:   Comorbid alcohol  abuse/dependence Recent sense of peace/wellbeing Alcohol/Substance Abuse/Dependencies Previous Psychiatric Diagnoses and Treatments Medical Diagnoses and Treatments/Surgeries  Cognitive Features That Contribute To Risk:  Polarized thinking    Suicide Risk:  Minimal: No identifiable suicidal ideation.  Patients presenting with no risk factors but with morbid ruminations; may be classified as minimal risk based on the severity of the depressive symptoms  Discharge Diagnoses:   AXIS I:  Generalized Anxiety Disorder and Substance Induced Mood Disorder AXIS II:  Deferred AXIS III:   Past Medical History  Diagnosis Date  . Hypertension   . Alcoholism /alcohol abuse   . Anxiety and depression   . Tobacco abuse    AXIS IV:  other psychosocial or environmental problems, problems related to social environment and problems with primary support group AXIS V:  61-70 mild symptoms  Plan Of Care/Follow-up recommendations:  Activity:  As tolerated Diet:  Regular  Is patient on multiple antipsychotic therapies at discharge:  No   Has Patient had three or more failed trials of antipsychotic monotherapy by history:  No  Recommended Plan for Multiple Antipsychotic Therapies: NA    [name] 05/15/2013, 11:52 AM

## 2013-05-15 NOTE — Progress Notes (Signed)
D: Patient denies SI/HI or AVH.  He is slightly anxious but appropriate.  He reports good sleep and appetite.  Pt. Has been up attending groups and interacting well with others.  Pt. Remains cooperative with staff.  A: Patient given emotional support from RN. Patient encouraged to come to staff with concerns and/or questions. Patient's medication routine continued. Patient's orders and plan of care reviewed.   R: Patient remains appropriate and cooperative. Will continue to monitor patient q15 minutes for safety.

## 2013-05-18 NOTE — ED Provider Notes (Signed)
Shared service with midlevel provider. I have personally seen and examined the patient, providing direct face to face care, presenting with the chief complaint of detox. Physical exam findings include intoxicated patient, with non focal physical exam for any acute findings. Plan will be to discharge, with outpatient resources. I have reviewed the nursing documentation on past medical history, family history, and social history.   Patient is clinically sober. He is talking coherently, gait is normal, and is demonstrating rational thought process. We shall discharge him shortly, and we have discussed the warning signs of alcohol withdrawal with him verbally, and the information will be provided with the discharge instructions as well. Pt has no signs of acute withdrawals at the time of this discussion.    Derwood Kaplan, MD 05/18/13 1531

## 2013-05-20 NOTE — Progress Notes (Signed)
Patient Discharge Instructions:  After Visit Summary (AVS):   Faxed to:  05/20/13 Discharge Summary Note:   Faxed to:  05/20/13 Psychiatric Admission Assessment Note:   Faxed to:  05/20/13 Suicide Risk Assessment - Discharge Assessment:   Faxed to:  05/20/13 Faxed/Sent to the Next Level Care provider:  05/20/13 Faxed to [facility] @ [phone]   Jerelene Redden, 05/20/2013, 4:14 PM

## 2013-06-21 ENCOUNTER — Inpatient Hospital Stay (HOSPITAL_COMMUNITY)
Admission: EM | Admit: 2013-06-21 | Discharge: 2013-06-27 | DRG: 308 | Disposition: A | Discharge status: Home / Self Care | Payer: Self-pay | Attending: Internal Medicine | Admitting: Internal Medicine

## 2013-06-21 ENCOUNTER — Inpatient Hospital Stay (HOSPITAL_COMMUNITY): Payer: Self-pay

## 2013-06-21 ENCOUNTER — Encounter (HOSPITAL_COMMUNITY): Payer: Self-pay | Admitting: Emergency Medicine

## 2013-06-21 ENCOUNTER — Emergency Department (HOSPITAL_COMMUNITY): Payer: Self-pay

## 2013-06-21 DIAGNOSIS — F10229 Alcohol dependence with intoxication, unspecified: Secondary | ICD-10-CM | POA: Diagnosis present

## 2013-06-21 DIAGNOSIS — I10 Essential (primary) hypertension: Secondary | ICD-10-CM | POA: Diagnosis present

## 2013-06-21 DIAGNOSIS — J189 Pneumonia, unspecified organism: Secondary | ICD-10-CM | POA: Diagnosis not present

## 2013-06-21 DIAGNOSIS — R112 Nausea with vomiting, unspecified: Secondary | ICD-10-CM

## 2013-06-21 DIAGNOSIS — F329 Major depressive disorder, single episode, unspecified: Secondary | ICD-10-CM | POA: Diagnosis present

## 2013-06-21 DIAGNOSIS — Z9119 Patient's noncompliance with other medical treatment and regimen: Secondary | ICD-10-CM

## 2013-06-21 DIAGNOSIS — R1011 Right upper quadrant pain: Secondary | ICD-10-CM

## 2013-06-21 DIAGNOSIS — Z720 Tobacco use: Secondary | ICD-10-CM

## 2013-06-21 DIAGNOSIS — J9600 Acute respiratory failure, unspecified whether with hypoxia or hypercapnia: Secondary | ICD-10-CM | POA: Diagnosis present

## 2013-06-21 DIAGNOSIS — I498 Other specified cardiac arrhythmias: Secondary | ICD-10-CM | POA: Diagnosis not present

## 2013-06-21 DIAGNOSIS — F10239 Alcohol dependence with withdrawal, unspecified: Secondary | ICD-10-CM | POA: Diagnosis present

## 2013-06-21 DIAGNOSIS — J810 Acute pulmonary edema: Secondary | ICD-10-CM | POA: Diagnosis present

## 2013-06-21 DIAGNOSIS — N3090 Cystitis, unspecified without hematuria: Secondary | ICD-10-CM | POA: Diagnosis present

## 2013-06-21 DIAGNOSIS — F10939 Alcohol use, unspecified with withdrawal, unspecified: Secondary | ICD-10-CM | POA: Diagnosis present

## 2013-06-21 DIAGNOSIS — I4891 Unspecified atrial fibrillation: Principal | ICD-10-CM | POA: Diagnosis present

## 2013-06-21 DIAGNOSIS — G4700 Insomnia, unspecified: Secondary | ICD-10-CM | POA: Diagnosis present

## 2013-06-21 DIAGNOSIS — F32A Depression, unspecified: Secondary | ICD-10-CM

## 2013-06-21 DIAGNOSIS — K5289 Other specified noninfective gastroenteritis and colitis: Secondary | ICD-10-CM | POA: Diagnosis present

## 2013-06-21 DIAGNOSIS — I4892 Unspecified atrial flutter: Secondary | ICD-10-CM | POA: Diagnosis present

## 2013-06-21 DIAGNOSIS — K7010 Alcoholic hepatitis without ascites: Secondary | ICD-10-CM | POA: Diagnosis present

## 2013-06-21 DIAGNOSIS — K226 Gastro-esophageal laceration-hemorrhage syndrome: Secondary | ICD-10-CM | POA: Diagnosis present

## 2013-06-21 DIAGNOSIS — Z8249 Family history of ischemic heart disease and other diseases of the circulatory system: Secondary | ICD-10-CM

## 2013-06-21 DIAGNOSIS — K810 Acute cholecystitis: Secondary | ICD-10-CM | POA: Diagnosis present

## 2013-06-21 DIAGNOSIS — F419 Anxiety disorder, unspecified: Secondary | ICD-10-CM

## 2013-06-21 DIAGNOSIS — K219 Gastro-esophageal reflux disease without esophagitis: Secondary | ICD-10-CM | POA: Diagnosis present

## 2013-06-21 DIAGNOSIS — E860 Dehydration: Secondary | ICD-10-CM | POA: Diagnosis present

## 2013-06-21 DIAGNOSIS — R109 Unspecified abdominal pain: Secondary | ICD-10-CM | POA: Diagnosis present

## 2013-06-21 DIAGNOSIS — F411 Generalized anxiety disorder: Secondary | ICD-10-CM | POA: Diagnosis present

## 2013-06-21 DIAGNOSIS — J40 Bronchitis, not specified as acute or chronic: Secondary | ICD-10-CM | POA: Diagnosis present

## 2013-06-21 DIAGNOSIS — I2789 Other specified pulmonary heart diseases: Secondary | ICD-10-CM | POA: Diagnosis present

## 2013-06-21 DIAGNOSIS — F3289 Other specified depressive episodes: Secondary | ICD-10-CM

## 2013-06-21 DIAGNOSIS — IMO0001 Reserved for inherently not codable concepts without codable children: Secondary | ICD-10-CM | POA: Diagnosis present

## 2013-06-21 DIAGNOSIS — J9601 Acute respiratory failure with hypoxia: Secondary | ICD-10-CM | POA: Diagnosis present

## 2013-06-21 DIAGNOSIS — Z7982 Long term (current) use of aspirin: Secondary | ICD-10-CM

## 2013-06-21 DIAGNOSIS — F1020 Alcohol dependence, uncomplicated: Secondary | ICD-10-CM | POA: Diagnosis present

## 2013-06-21 DIAGNOSIS — A419 Sepsis, unspecified organism: Secondary | ICD-10-CM | POA: Diagnosis present

## 2013-06-21 DIAGNOSIS — K7030 Alcoholic cirrhosis of liver without ascites: Secondary | ICD-10-CM | POA: Diagnosis present

## 2013-06-21 DIAGNOSIS — I8510 Secondary esophageal varices without bleeding: Secondary | ICD-10-CM | POA: Diagnosis present

## 2013-06-21 DIAGNOSIS — F17200 Nicotine dependence, unspecified, uncomplicated: Secondary | ICD-10-CM | POA: Diagnosis present

## 2013-06-21 DIAGNOSIS — K8010 Calculus of gallbladder with chronic cholecystitis without obstruction: Secondary | ICD-10-CM | POA: Diagnosis present

## 2013-06-21 DIAGNOSIS — Z91199 Patient's noncompliance with other medical treatment and regimen due to unspecified reason: Secondary | ICD-10-CM

## 2013-06-21 DIAGNOSIS — K922 Gastrointestinal hemorrhage, unspecified: Secondary | ICD-10-CM | POA: Diagnosis present

## 2013-06-21 HISTORY — DX: Unspecified atrial fibrillation: I48.91

## 2013-06-21 LAB — MRSA PCR SCREENING: MRSA BY PCR: NEGATIVE

## 2013-06-21 LAB — I-STAT CHEM 8, ED
BUN: 15 mg/dL (ref 6–23)
CALCIUM ION: 1.13 mmol/L (ref 1.12–1.23)
CHLORIDE: 108 meq/L (ref 96–112)
Creatinine, Ser: 0.9 mg/dL (ref 0.50–1.35)
GLUCOSE: 83 mg/dL (ref 70–99)
HCT: 40 % (ref 39.0–52.0)
Hemoglobin: 13.6 g/dL (ref 13.0–17.0)
POTASSIUM: 3.5 meq/L — AB (ref 3.7–5.3)
Sodium: 141 mEq/L (ref 137–147)
TCO2: 20 mmol/L (ref 0–100)

## 2013-06-21 LAB — CBC WITH DIFFERENTIAL/PLATELET
Basophils Absolute: 0 10*3/uL (ref 0.0–0.1)
Basophils Relative: 1 % (ref 0–1)
Eosinophils Absolute: 0.2 10*3/uL (ref 0.0–0.7)
Eosinophils Relative: 3 % (ref 0–5)
HEMATOCRIT: 36.8 % — AB (ref 39.0–52.0)
HEMOGLOBIN: 12.8 g/dL — AB (ref 13.0–17.0)
LYMPHS ABS: 2.3 10*3/uL (ref 0.7–4.0)
Lymphocytes Relative: 37 % (ref 12–46)
MCH: 33 pg (ref 26.0–34.0)
MCHC: 34.8 g/dL (ref 30.0–36.0)
MCV: 94.8 fL (ref 78.0–100.0)
MONO ABS: 0.4 10*3/uL (ref 0.1–1.0)
Monocytes Relative: 7 % (ref 3–12)
Neutro Abs: 3.3 10*3/uL (ref 1.7–7.7)
Neutrophils Relative %: 52 % (ref 43–77)
Platelets: 186 10*3/uL (ref 150–400)
RBC: 3.88 MIL/uL — AB (ref 4.22–5.81)
RDW: 13.6 % (ref 11.5–15.5)
WBC: 6.3 10*3/uL (ref 4.0–10.5)

## 2013-06-21 LAB — COMPREHENSIVE METABOLIC PANEL WITH GFR
ALT: 148 U/L — ABNORMAL HIGH (ref 0–53)
AST: 71 U/L — ABNORMAL HIGH (ref 0–37)
Albumin: 3.2 g/dL — ABNORMAL LOW (ref 3.5–5.2)
Alkaline Phosphatase: 80 U/L (ref 39–117)
BUN: 17 mg/dL (ref 6–23)
CO2: 18 meq/L — ABNORMAL LOW (ref 19–32)
Calcium: 8.1 mg/dL — ABNORMAL LOW (ref 8.4–10.5)
Chloride: 107 meq/L (ref 96–112)
Creatinine, Ser: 0.77 mg/dL (ref 0.50–1.35)
GFR calc Af Amer: >90 mL/min
GFR calc non Af Amer: >90 mL/min
Glucose, Bld: 86 mg/dL (ref 70–99)
Potassium: 3.6 meq/L — ABNORMAL LOW (ref 3.7–5.3)
Sodium: 139 meq/L (ref 137–147)
Total Bilirubin: 0.3 mg/dL (ref 0.3–1.2)
Total Protein: 6.1 g/dL (ref 6.0–8.3)

## 2013-06-21 LAB — I-STAT TROPONIN, ED: TROPONIN I, POC: 0.01 ng/mL (ref 0.00–0.08)

## 2013-06-21 LAB — URINALYSIS, ROUTINE W REFLEX MICROSCOPIC
BILIRUBIN URINE: NEGATIVE
GLUCOSE, UA: NEGATIVE mg/dL
HGB URINE DIPSTICK: NEGATIVE
KETONES UR: NEGATIVE mg/dL
Leukocytes, UA: NEGATIVE
Nitrite: NEGATIVE
Protein, ur: NEGATIVE mg/dL
Specific Gravity, Urine: 1.021 (ref 1.005–1.030)
UROBILINOGEN UA: 0.2 mg/dL (ref 0.0–1.0)
pH: 5 (ref 5.0–8.0)

## 2013-06-21 LAB — HEPATITIS PANEL, ACUTE
HCV Ab: NEGATIVE
Hep A IgM: NONREACTIVE
Hep B C IgM: NONREACTIVE
Hepatitis B Surface Ag: NEGATIVE

## 2013-06-21 LAB — LIPASE, BLOOD: Lipase: 20 U/L (ref 11–59)

## 2013-06-21 LAB — I-STAT CG4 LACTIC ACID, ED: Lactic Acid, Venous: 1.49 mmol/L (ref 0.5–2.2)

## 2013-06-21 LAB — TSH: TSH: 1.49 u[IU]/mL (ref 0.350–4.500)

## 2013-06-21 MED ORDER — TRAZODONE HCL 50 MG PO TABS
50.0000 mg | ORAL_TABLET | Freq: Every day | ORAL | Status: DC
  Administered 2013-06-21 – 2013-06-26 (×6): 50 mg via ORAL
  Filled 2013-06-21 (×9): qty 1

## 2013-06-21 MED ORDER — SODIUM CHLORIDE 0.9 % IV SOLN
INTRAVENOUS | Status: DC
  Administered 2013-06-21 (×2): via INTRAVENOUS
  Administered 2013-06-21: 150 mL/h via INTRAVENOUS
  Administered 2013-06-22 – 2013-06-23 (×4): via INTRAVENOUS

## 2013-06-21 MED ORDER — ASPIRIN 325 MG PO TABS
325.0000 mg | ORAL_TABLET | Freq: Every day | ORAL | Status: DC
  Administered 2013-06-21 – 2013-06-22 (×2): 325 mg via ORAL
  Filled 2013-06-21 (×3): qty 1

## 2013-06-21 MED ORDER — DILTIAZEM HCL 100 MG IV SOLR
5.0000 mg/h | INTRAVENOUS | Status: DC
  Administered 2013-06-21: 10 mg/h via INTRAVENOUS
  Administered 2013-06-21: 5 mg/h via INTRAVENOUS
  Filled 2013-06-21 (×2): qty 100

## 2013-06-21 MED ORDER — ONDANSETRON HCL 4 MG/2ML IJ SOLN: 4.0000 mg | Freq: Three times a day (TID) | INTRAMUSCULAR | Status: DC | PRN

## 2013-06-21 MED ORDER — PIPERACILLIN-TAZOBACTAM 3.375 G IVPB
3.3750 g | Freq: Three times a day (TID) | INTRAVENOUS | Status: DC
  Administered 2013-06-21 – 2013-06-22 (×4): 3.375 g via INTRAVENOUS
  Filled 2013-06-21 (×6): qty 50

## 2013-06-21 MED ORDER — TECHNETIUM TC 99M MEBROFENIN IV KIT
5.0000 | PACK | Freq: Once | INTRAVENOUS | Status: AC | PRN
  Administered 2013-06-21: 5 via INTRAVENOUS

## 2013-06-21 MED ORDER — SODIUM CHLORIDE 0.9 % IV BOLUS (SEPSIS)
1000.0000 mL | Freq: Once | INTRAVENOUS | Status: AC
Start: 2013-06-21 — End: 2013-06-21
  Administered 2013-06-21: 1000 mL via INTRAVENOUS

## 2013-06-21 MED ORDER — PIPERACILLIN-TAZOBACTAM 3.375 G IVPB
3.3750 g | Freq: Once | INTRAVENOUS | Status: AC
  Administered 2013-06-21: 3.375 g via INTRAVENOUS
  Filled 2013-06-21: qty 50

## 2013-06-21 MED ORDER — ONDANSETRON HCL 4 MG/2ML IJ SOLN: 4.0000 mg | Freq: Four times a day (QID) | INTRAMUSCULAR | Status: DC | PRN

## 2013-06-21 MED ORDER — VANCOMYCIN HCL 10 G IV SOLR
1250.0000 mg | Freq: Two times a day (BID) | INTRAVENOUS | Status: DC
  Administered 2013-06-21 – 2013-06-22 (×3): 1250 mg via INTRAVENOUS
  Filled 2013-06-21 (×5): qty 1250

## 2013-06-21 MED ORDER — PANTOPRAZOLE SODIUM 40 MG IV SOLR
40.0000 mg | Freq: Two times a day (BID) | INTRAVENOUS | Status: DC
  Administered 2013-06-21 – 2013-06-24 (×7): 40 mg via INTRAVENOUS
  Filled 2013-06-21 (×10): qty 40

## 2013-06-21 MED ORDER — NICOTINE 14 MG/24HR TD PT24
14.0000 mg | MEDICATED_PATCH | Freq: Every day | TRANSDERMAL | Status: DC
  Administered 2013-06-21 – 2013-06-27 (×7): 14 mg via TRANSDERMAL
  Filled 2013-06-21 (×7): qty 1

## 2013-06-21 MED ORDER — POTASSIUM CHLORIDE CRYS ER 20 MEQ PO TBCR
40.0000 meq | EXTENDED_RELEASE_TABLET | Freq: Once | ORAL | Status: AC
  Administered 2013-06-21: 40 meq via ORAL
  Filled 2013-06-21: qty 2

## 2013-06-21 MED ORDER — VITAMIN B-1 100 MG PO TABS
100.0000 mg | ORAL_TABLET | Freq: Every day | ORAL | Status: DC
  Administered 2013-06-21 – 2013-06-27 (×7): 100 mg via ORAL
  Filled 2013-06-21 (×7): qty 1

## 2013-06-21 MED ORDER — PIPERACILLIN-TAZOBACTAM 3.375 G IVPB: 3.3750 g | Freq: Three times a day (TID) | INTRAVENOUS | Status: DC

## 2013-06-21 MED ORDER — SODIUM CHLORIDE 0.9 % IV SOLN
1000.0000 mL | Freq: Once | INTRAVENOUS | Status: AC
  Administered 2013-06-21: 1000 mL via INTRAVENOUS

## 2013-06-21 MED ORDER — ONDANSETRON HCL 4 MG PO TABS: 4.0000 mg | ORAL_TABLET | Freq: Four times a day (QID) | ORAL | Status: DC | PRN

## 2013-06-21 MED ORDER — SODIUM CHLORIDE 0.9 % IJ SOLN
3.0000 mL | Freq: Two times a day (BID) | INTRAMUSCULAR | Status: DC
  Administered 2013-06-21 – 2013-06-25 (×6): 3 mL via INTRAVENOUS

## 2013-06-21 MED ORDER — ENOXAPARIN SODIUM 40 MG/0.4ML ~~LOC~~ SOLN
40.0000 mg | SUBCUTANEOUS | Status: DC
  Administered 2013-06-21 – 2013-06-23 (×3): 40 mg via SUBCUTANEOUS
  Filled 2013-06-21 (×4): qty 0.4

## 2013-06-21 NOTE — H&P (Signed)
[facility] History and Physical  Patient: [name]  HUD:[id number]  DOB: July 21, 1965  DOS: the patient was seen and examined on 06/21/2013 PCP: No PCP Per Patient  Chief Complaint:   HPI: Kurtis Cord is a 48 y.o. male with Past medical history of hypertension, A. fib, alcohol abuse, tobacco abuse, major depression. Patient was recently hospitalized for A. fib with RVR and was found to have major depression and was discharged to behavioral health. From behavioral health he was recently discharged after alcohol detox. Patient was recommended to continue taking his medications. Patient mentions that after going from the hospital he has not taken any of his medications. Patient presented today with the complaints of 8-9 episodes of vomiting with streaking of bright red blood, diarrhea for 4-5 times in the last 1 day. He also complains of acid reflux. He complains of some shortness of breath and cough that is present since last one week. He mentions he brings up green yellow sputum. He denies any blood in the sputum. He complains of fever and chills. He complains of dizziness without any positive episodes. He denied any abdominal pain but has significant pain on examination which he mentions has been present since last one day. He denies any prior abdominal tenderness associated with food. He mentions he has not drank any alcohol after his recent discharge from the behavioral health. He denies any rash anywhere denies any blood in the urine blood in the bowels. He mentions about burning urination and bilateral leg swelling. He also complains of difficulty with breathing when lying down.  The patient is coming from home. And at his baseline independent for most of his ADL.  Review of Systems: as mentioned in the history of present illness.  A Comprehensive review of the other systems is negative.  Past Medical History  Diagnosis Date  . Hypertension   . Alcoholism /alcohol abuse   .  Anxiety and depression   . Tobacco abuse    Past Surgical History  Procedure Laterality Date  . Hernia repair     Social History:  reports that he has been smoking Cigarettes.  He has a 30 pack-year smoking history. He has never used smokeless tobacco. He reports that he drinks alcohol. He reports that he does not use illicit drugs.  Allergies  Allergen Reactions  . Bee Venom Anaphylaxis    Uses epi pen-- last used 08/23/11    Family History  Problem Relation Age of Onset  . Heart attack Mother   . Heart attack Father   . Hypertension Mother   . Hypertension Father     Prior to Admission medications   Medication Sig Start Date End Date Taking? Authorizing Provider  amiodarone (PACERONE) 400 MG tablet Take 0.5 tablets (200 mg total) by mouth 2 (two) times daily. 05/15/13   Verne Spurr, PA-C  apixaban (ELIQUIS) 5 MG TABS tablet Take 1 tablet (5 mg total) by mouth 2 (two) times daily. 05/15/13   Verne Spurr, PA-C  aspirin 325 MG tablet Take 650 mg by mouth every 6 (six) hours as needed (pain).    Historical Provider, MD  FLUoxetine (PROZAC) 20 MG capsule Take 1 capsule (20 mg total) by mouth daily. 05/15/13   Verne Spurr, PA-C  metoprolol tartrate (LOPRESSOR) 25 MG tablet Take 0.5 tablets (12.5 mg total) by mouth 2 (two) times daily. 05/15/13   Verne Spurr, PA-C  traZODone (DESYREL) 50 MG tablet Take 1 tablet (50 mg total) by mouth at bedtime. 05/15/13  Verne Spurr, PA-C    Physical Exam: Filed Vitals:   06/21/13 0515 06/21/13 0530 06/21/13 0600 06/21/13 0615  BP: 111/88 104/73 115/73 109/83  Pulse:    103  Temp:      TempSrc:      Resp: 12 22 22 25   Height:      Weight:      SpO2: 93% 95% 94% 93%    General: Alert, Awake and Oriented to Time, Place and Person. Appear in mild distress Eyes: PERRL ENT: Oral Mucosa clear moist Neck: Mild JVD Cardiovascular: S1 and S2 Present, aortic systolic Murmur, Peripheral Pulses Present Respiratory: Bilateral Air entry equal  and Decreased, bilateral basal Crackles, no wheezes Abdomen: Bowel Sound Present, Soft and exquisite right-sided tender with voluntary guarding Skin: No Rash Extremities: Bilateral Pedal edema, no calf tenderness Neurologic: Grossly no focal neuro deficit.  Labs on Admission:  CBC:  Recent Labs Lab 06/21/13 0045 06/21/13 0056  WBC 6.3  --   NEUTROABS 3.3  --   HGB 12.8* 13.6  HCT 36.8* 40.0  MCV 94.8  --   PLT 186  --     CMP     Component Value Date/Time   NA 141 06/21/2013 0056   K 3.5* 06/21/2013 0056   CL 108 06/21/2013 0056   CO2 18* 06/21/2013 0045   GLUCOSE 83 06/21/2013 0056   BUN 15 06/21/2013 0056   CREATININE 0.90 06/21/2013 0056   CALCIUM 8.1* 06/21/2013 0045   PROT 6.1 06/21/2013 0045   ALBUMIN 3.2* 06/21/2013 0045   AST 71* 06/21/2013 0045   ALT 148* 06/21/2013 0045   ALKPHOS 80 06/21/2013 0045   BILITOT 0.3 06/21/2013 0045   GFRNONAA >90 06/21/2013 0045   GFRAA >90 06/21/2013 0045     Recent Labs Lab 06/21/13 0045  LIPASE 20   No results found for this basename: AMMONIA,  in the last 168 hours  No results found for this basename: CKTOTAL, CKMB, CKMBINDEX, TROPONINI,  in the last 168 hours BNP (last 3 results) No results found for this basename: PROBNP,  in the last 8760 hours  Radiological Exams on Admission: Dg Chest Portable 1 View  06/21/2013   CLINICAL DATA:  ABDOMINAL PAIN  EXAM: PORTABLE CHEST - 1 VIEW  COMPARISON:  DG CHEST 1V PORT dated 05/05/2013  FINDINGS: Cardiac silhouette is enlarged. There is mild prominence of the interstitial markings and mild peribronchial cuffing. No focal region of consolidation or focal infiltrates. No acute osseous abnormalities.  IMPRESSION: Mild pulmonary edema.   Electronically Signed   By: Salome Holmes M.D.   On: 06/21/2013 01:17   US Abdomen Limited Ruq  06/21/2013   CLINICAL DATA:  Right upper quadrant abdominal pain.  EXAM: US ABDOMEN LIMITED - RIGHT UPPER QUADRANT  COMPARISON:  No priors.  FINDINGS: Gallbladder:   Echogenic material layering dependently in the gallbladder, compatible with biliary sludge. No definite gallstones. Gallbladder wall appears thickened measuring 6 mm. Gallbladder is only mildly distended. Trace amount of pericholecystic fluid. Per report from the sonographer, the patient did exhibit a sonographic Murphy's sign on examination.  Common bile duct:  Diameter: Normal in caliber measuring 2.7 mm in the porta hepatis.  Liver:  No focal lesion identified. Within normal limits in parenchymal echogenicity.  Trace right pleural effusion.  IMPRESSION: 1. Biliary sludge in the gallbladder, which demonstrates a thickened gallbladder wall with trace amount of pericholecystic fluid and positive Murphy's sign on examination. Findings are suspicious for acute cholecystitis, however, the gallbladder is  not significantly distended at this time. Clinical correlation is recommended. 2. Small right pleural effusion.   Electronically Signed   By: Trudie Reedaniel  Entrikin M.D.   On: 06/21/2013 04:37    EKG: Independently reviewed. atrial fibrillation, RVR.  Assessment/Plan Principal Problem:   Sepsis Active Problems:   Alcoholism /alcohol abuse   Tobacco abuse   Hypertension   MDD (major depressive disorder)   Atrial fibrillation with RVR   Acute acalculous cholecystitis   1. Sepsis The patient is presenting with complaints of cough shortness of breath. He is also found to have abdominal tenderness on examination. Ultrasound is for cholecystitis with gallstone. He does not have any leukocytosis. He also has tachycardia with atrial fibrillation and mild elevation of his AST and ALT. With this the patient appears to have possible sepsis secondary to cystitis. We'll obtain blood cultures, continue him on Zosyn.  We will also add vancomycin empirically 2 cover for possible resistant bronchitis due to his recent hospitalization. General surgery has been consulted for his cholecystitis.  2. A. fib with RVR Acute  on chronic diastolic dysfunction Patient is noncompliant with his medication and has not been taking his amiodarone as well as Eliquis since last to 3 months. At present he is on Cardizem drip with his rate controlled to 80s to 90s. I would continue him on Cardizem drip at present and monitor him in stepdown unit. For this diastolic dysfunction hydration will be gentle for his sepsis. Continue to monitor for volume overload. All pain echocardiogram. And aspirin. I would hold off on Eliquis for possible surgical preparation.  3. Major depressive disorder. Patient was recently admitted to behavioral health was not on any medication at the time of the discharge as per the discharge summary. Would continue to monitor. Does not have any suicidal or homicidal ideation.  4. Tobacco abuse Nicotine patch Alcohol abuse Patient denies any alcohol abuse we'll continue to monitor. thiamin  Consults: General surgery  DVT Prophylaxis: subcutaneous Heparin Nutrition: N.p.o. except medications  Code Status: Full  Family Communication: Significant other was present at bedside, opportunity was given to ask question and all questions were answered satisfactorily at the time of interview. Disposition: Admitted to inpatient in step-down unit.  Author: Lynden OxfordPranav Declan Adamson, MD [facility] Pager: [phone] 06/21/2013, 6:53 AM    If 7PM-7AM, please contact night-coverage [email or web address] Password [email or web address]

## 2013-06-21 NOTE — Progress Notes (Signed)
Patient's HIDA scan is negative for acute cholecystitis.  Agree with medicine's note for a GI consultation for evaluation.  No surgical plans or indications.  We will sign off.  Please call if needed.  Letha Cape 3:15 PM 06/21/2013

## 2013-06-21 NOTE — Consult Note (Signed)
[name] 07-20-1965  [id number].   Requesting MD: Dr. Dorathy Kinsman Chief Complaint/Reason for Consult: abdominal pain HPI: This is a 48 year old white male with a significant history of alcohol abuse, hypertension, atrial fibrillation, and depression. The patient states that yesterday afternoon he started developing multiple episodes of emesis some with blood streaks. He also began having multiple episodes of diarrhea also with blood and meds. He does state that he has a history of hemorrhoids. He denied any abdominal pain. Denied any fevers or chills. He has not eaten since yesterday so he is unable to tell whether his abdomen hurts worse with this or not. He presented to [facility] emergency department for further evaluation. Upon arrival he was noted to have tachycardia and found to be in atrial fibrillation. He had a normal white blood cell count with no left shift. He had a mild elevation of his AST and ALT of 71 and 148 but his total bilirubin and alkaline phosphatase were normal. His lipase was also normal. He had an abdominal ultrasound which revealed no gallstones but some sludge. He had some gallbladder wall thickening possibly up to 6 mm and was tender in the right upper quadrant. Patient is currently being admitted by the medical service. We have been asked to evaluate the patient for further recommendations.  ROS please see history of present illness otherwise all other systems have been reviewed and are negative.   Family History  Problem Relation Age of Onset  . Heart attack Mother   . Heart attack Father   . Hypertension Mother   . Hypertension Father     Past Medical History  Diagnosis Date  . Hypertension   . Alcoholism /alcohol abuse   . Anxiety and depression   . Tobacco abuse   . Atrial fibrillation     Past Surgical History  Procedure Laterality Date  . Hernia repair      UHR x2    Social History:  reports that he has been smoking Cigarettes.  He has a 30  pack-year smoking history. He has never used smokeless tobacco. He reports that he drinks alcohol. He reports that he does not use illicit drugs.  Allergies:  Allergies  Allergen Reactions  . Bee Venom Anaphylaxis    Uses epi pen-- last used 08/23/11     (Not in a hospital admission)  Blood pressure 110/75, pulse 103, temperature 97.9 F (36.6 C), temperature source Oral, resp. rate 25, height 5' 5"  (1.651 m), weight 202 lb (91.627 kg), SpO2 93.00%. Physical Exam: General: WD, WN white male who is laying in bed in NAD, often falls asleep on talking to him HEENT: head is normocephalic, atraumatic.  Sclera are noninjected.  PERRL.  Ears and nose without any masses or lesions.  Mouth is pink and moist Heart: Tachycardic with an irregularly irregular rhythm.  Normal s1,s2. No obvious murmurs, gallops, or rubs noted.  Palpable radial and pedal pulses bilaterally Lungs: CTAB, no wheezes, rhonchi, or rales noted.  Respiratory effort nonlabored Abd: soft, positive bowel sounds, nondistended, the patient will follow sleep during my exam. When he was asleep I could press on his abdomen and he had no tenderness except for very deep palpation to his right upper quadrant. Once he would await and he was aware of what I was doing he was guarding jump off the bed with minimal tenderness to his right upper quadrant. no masses, hernias, or organomegaly. Scar noted at his umbilicus prior hernia repair. MS: all 4 extremities are  symmetrical with no cyanosis, clubbing, or edema. Skin: warm and dry with no masses, lesions, or rashes Psych: A&Ox3 with an appropriate affect.    Results for orders placed during the hospital encounter of 06/21/13 (from the past 48 hour(s))  CBC WITH DIFFERENTIAL     Status: Abnormal   Collection Time    06/21/13 12:45 AM      Result Value Ref Range   WBC 6.3  4.0 - 10.5 K/uL   RBC 3.88 (*) 4.22 - 5.81 MIL/uL   Hemoglobin 12.8 (*) 13.0 - 17.0 g/dL   HCT 36.8 (*) 39.0 - 52.0 %    MCV 94.8  78.0 - 100.0 fL   MCH 33.0  26.0 - 34.0 pg   MCHC 34.8  30.0 - 36.0 g/dL   RDW 13.6  11.5 - 15.5 %   Platelets 186  150 - 400 K/uL   Neutrophils Relative % 52  43 - 77 %   Neutro Abs 3.3  1.7 - 7.7 K/uL   Lymphocytes Relative 37  12 - 46 %   Lymphs Abs 2.3  0.7 - 4.0 K/uL   Monocytes Relative 7  3 - 12 %   Monocytes Absolute 0.4  0.1 - 1.0 K/uL   Eosinophils Relative 3  0 - 5 %   Eosinophils Absolute 0.2  0.0 - 0.7 K/uL   Basophils Relative 1  0 - 1 %   Basophils Absolute 0.0  0.0 - 0.1 K/uL  COMPREHENSIVE METABOLIC PANEL     Status: Abnormal   Collection Time    06/21/13 12:45 AM      Result Value Ref Range   Sodium 139  137 - 147 mEq/L   Potassium 3.6 (*) 3.7 - 5.3 mEq/L   Chloride 107  96 - 112 mEq/L   CO2 18 (*) 19 - 32 mEq/L   Glucose, Bld 86  70 - 99 mg/dL   BUN 17  6 - 23 mg/dL   Creatinine, Ser 0.77  0.50 - 1.35 mg/dL   Calcium 8.1 (*) 8.4 - 10.5 mg/dL   Total Protein 6.1  6.0 - 8.3 g/dL   Albumin 3.2 (*) 3.5 - 5.2 g/dL   AST 71 (*) 0 - 37 U/L   ALT 148 (*) 0 - 53 U/L   Alkaline Phosphatase 80  39 - 117 U/L   Total Bilirubin 0.3  0.3 - 1.2 mg/dL   GFR calc non Af Amer >90  >90 mL/min   GFR calc Af Amer >90  >90 mL/min   Comment: (NOTE)     The eGFR has been calculated using the CKD EPI equation.     This calculation has not been validated in all clinical situations.     eGFR's persistently <90 mL/min signify possible Chronic Kidney     Disease.  LIPASE, BLOOD     Status: None   Collection Time    06/21/13 12:45 AM      Result Value Ref Range   Lipase 20  11 - 59 U/L  I-STAT TROPOININ, ED     Status: None   Collection Time    06/21/13 12:52 AM      Result Value Ref Range   Troponin i, poc 0.01  0.00 - 0.08 ng/mL   Comment 3            Comment: Due to the release kinetics of cTnI,     a negative result within the first hours     of the  onset of symptoms does not rule out     myocardial infarction with certainty.     If myocardial infarction is  still suspected,     repeat the test at appropriate intervals.  I-STAT CG4 LACTIC ACID, ED     Status: None   Collection Time    06/21/13 12:55 AM      Result Value Ref Range   Lactic Acid, Venous 1.49  0.5 - 2.2 mmol/L  I-STAT CHEM 8, ED     Status: Abnormal   Collection Time    06/21/13 12:56 AM      Result Value Ref Range   Sodium 141  137 - 147 mEq/L   Potassium 3.5 (*) 3.7 - 5.3 mEq/L   Chloride 108  96 - 112 mEq/L   BUN 15  6 - 23 mg/dL   Creatinine, Ser 0.90  0.50 - 1.35 mg/dL   Glucose, Bld 83  70 - 99 mg/dL   Calcium, Ion 1.13  1.12 - 1.23 mmol/L   TCO2 20  0 - 100 mmol/L   Hemoglobin 13.6  13.0 - 17.0 g/dL   HCT 40.0  39.0 - 52.0 %  URINALYSIS, ROUTINE W REFLEX MICROSCOPIC     Status: None   Collection Time    06/21/13  5:35 AM      Result Value Ref Range   Color, Urine YELLOW  YELLOW   APPearance CLEAR  CLEAR   Specific Gravity, Urine 1.021  1.005 - 1.030   pH 5.0  5.0 - 8.0   Glucose, UA NEGATIVE  NEGATIVE mg/dL   Hgb urine dipstick NEGATIVE  NEGATIVE   Bilirubin Urine NEGATIVE  NEGATIVE   Ketones, ur NEGATIVE  NEGATIVE mg/dL   Protein, ur NEGATIVE  NEGATIVE mg/dL   Urobilinogen, UA 0.2  0.0 - 1.0 mg/dL   Nitrite NEGATIVE  NEGATIVE   Leukocytes, UA NEGATIVE  NEGATIVE   Comment: MICROSCOPIC NOT DONE ON URINES WITH NEGATIVE PROTEIN, BLOOD, LEUKOCYTES, NITRITE, OR GLUCOSE <1000 mg/dL.   Dg Chest Portable 1 View  06/21/2013   CLINICAL DATA:  ABDOMINAL PAIN  EXAM: PORTABLE CHEST - 1 VIEW  COMPARISON:  DG CHEST 1V PORT dated 05/05/2013  FINDINGS: Cardiac silhouette is enlarged. There is mild prominence of the interstitial markings and mild peribronchial cuffing. No focal region of consolidation or focal infiltrates. No acute osseous abnormalities.  IMPRESSION: Mild pulmonary edema.   Electronically Signed   By: Margaree Mackintosh M.D.   On: 06/21/2013 01:17   US Abdomen Limited Ruq  06/21/2013   CLINICAL DATA:  Right upper quadrant abdominal pain.  EXAM: US ABDOMEN LIMITED  - RIGHT UPPER QUADRANT  COMPARISON:  No priors.  FINDINGS: Gallbladder:  Echogenic material layering dependently in the gallbladder, compatible with biliary sludge. No definite gallstones. Gallbladder wall appears thickened measuring 6 mm. Gallbladder is only mildly distended. Trace amount of pericholecystic fluid. Per report from the sonographer, the patient did exhibit a sonographic Murphy's sign on examination.  Common bile duct:  Diameter: Normal in caliber measuring 2.7 mm in the porta hepatis.  Liver:  No focal lesion identified. Within normal limits in parenchymal echogenicity.  Trace right pleural effusion.  IMPRESSION: 1. Biliary sludge in the gallbladder, which demonstrates a thickened gallbladder wall with trace amount of pericholecystic fluid and positive Murphy's sign on examination. Findings are suspicious for acute cholecystitis, however, the gallbladder is not significantly distended at this time. Clinical correlation is recommended. 2. Small right pleural effusion.   Electronically Signed  By: Vinnie Langton M.D.   On: 06/21/2013 04:37       Assessment/Plan 1. Abdominal pain 2. Atrial fibrillation with RVR 3. Significant alcohol abuse history 4. Tobacco abuse 5. Hypertension  Plan: We agree with medical admission for further evaluation of his atrial fibrillation and management of his medical problems. We agree with a HIDA scan. Due to the patient's significant alcohol abuse history, if he were to have some cirrhosis, and his ultrasound findings may be similar. The patient does not have a white blood cell count or a left shift. I think a HIDA is appropriate to further determine whether his pain is truly from his gallbladder or not. The patient also had several episodes of hematemesis. If the patient had a scan is negative then a GI evaluation maybe reasonable to further work this up as well as his abdominal pain. We will follow along with you for now. I agree with antibiotic therapy.  They gave this consultation.  Henreitta Cea 06/21/2013, 8:59 AM Pager: [phone]

## 2013-06-21 NOTE — Progress Notes (Signed)
[name]. Corliss Skains, MD, [facility]  General/ Trauma Surgery  06/21/2013 3:17 PM

## 2013-06-21 NOTE — ED Notes (Signed)
PER EMS: pt picked up from EMS from [facility]. Pt reports RUQ abdominal pain that started yesterday. Pt also reports N/V today and has vomited about 9 times and noticed bright red blood in vomit and stool. Pt also reports cough x a few months with dark green sputum. BP-100/80. Given 500cc bolus of NaCl by EMS. HR sinus tach in 140-150s. Pt A&OX4 and speaking clear and complete sentences.

## 2013-06-21 NOTE — Consult Note (Signed)
Patient's constellation of findings seems to be more consistent with cirrhosis or hepatitis.  HIDA pending.    Keep NPO until testing complete.  We will follow.  [name]. Robert Skains, MD, [facility]  General/ Trauma Surgery  06/21/2013 12:55 PM

## 2013-06-21 NOTE — ED Notes (Signed)
US at bedside

## 2013-06-21 NOTE — ED Notes (Signed)
Pt placed on 4L [place]. O2-97%

## 2013-06-21 NOTE — ED Notes (Signed)
Phlebotomy at bedside.

## 2013-06-21 NOTE — Progress Notes (Signed)
[name]  [name] DUK:[id number] DOB: 07/16/1965 DOA: 06/21/2013 PCP: No PCP Per Patient  Time spent :  Brief narrative: 48 year old male patient with known atrial fibrillation followed by Dr. Terrence Dupont as an outpatient. He also has a history of alcohol abuse and was admitted in April of this year for inpatient detoxification and treatment. Patient endorsed that since that hospitalization he has not had any alcohol intake. He did mention that prior to admission he had not been compliant with many of his medications including his eliquis. He endorsed he was unable to afford the eliquis. He presented to the hospital today because of recurrent vomiting with streaky bright red blood and diarrhea for 4-5 times over the past 24 hours and acid reflux symptoms the he apparently has been having ongoing abdominal pain especially after eating for several weeks.  Upon presentation to the emergency department he was found to be in atrial fibrillation with rapid ventricular response. He was tender in the right upper quadrant upon examination. His AST and ALT were elevated although total bilirubin and alkaline phosphatase were normal. He was afebrile and normotensive upon presentation but as noted his heart rate was rapid anywhere between 130 in 140 beats per minute.  HPI/Subjective: Patient continues to endorse right upper quadrant abdominal pain. No shortness of breath or chest pain. Endorses has not had any alcohol intake since beginning in April.  Assessment/Plan: Active Problems:   Atrial fibrillation with RVR -rate controlled with IV Cardizem -holding Amiodarone given elevated LFTs -Eliquis on hold due to reports of hematemsis pre admit    Sepsis -seems more dehydrated than septic but follow closely given possibility of cholecystitis -FU on cx's and cont empiric anbx's    Acute acalculous cholecystitis -unclear picture -sx's could either be from  ETOH/cirrhosis or cholecystitis -TB and alk phos norma -HIDA scan without evidence of acute cholecystitis so consider upper GI etiology (see below) -follow labs    Dehydration -2/2 recurrent N/V -cont IVFs -likely reason for RVR (and noncompliance with meds)    Acute respiratory failure with hypoxia/Acute pulmonary edema -suspect RVR as etiology -ECHO April 2015 with normal LV fnx and no DD and only mild pulmonary HTN -focus on rate control -CXR with only mild edema    Hematemesis -has not recurred since admit -HIDA negative so need to consult GI-since not actively bleeding and the on-call service very busy today we can wait until 5/23 to call -ck H. Pylori serolgies -cont PPI    Alcoholism /alcohol abuse -pt admanet has not had any ETOH in >1 month -montor -no coagulopathy or thrombocytopenia    Hypertension -BP remains soft      Tobacco abuse    MDD (major depressive disorder)    Patient nonadherence -partially due to cost of meds -CM to see -Discussed with patient and significant other need to adhere to utilization of medications especially anticoagulation in patient with atrial fibrillation given patient has high risk for stroke   DVT prophylaxis: Lovenox Code Status: Full Family Communication: Significant other/wife at bedside Disposition Plan/Expected LOS: Stepdown   Consultants: General surgery  Procedures: None  Antibiotics: Zosyn 5/22 >>> Vancomycin 5/22 >>>  Objective: Blood pressure 114/76, pulse 70, temperature 97.9 F (36.6 C), temperature source Oral, resp. rate 26, height _0  (1.651 m), weight 202 lb (91.627 kg), SpO2 95.00%.  Intake/Output Summary (Last 24 hours) at 06/21/13 1253 Last data filed at 06/21/13 0536  Gross per 24 hour  Intake   2000 ml  Output    475 ml  Net   1525 ml     Exam: Patient admitted at 6:53 AM today. Follow up exam completed. Patient continues with right upper quadrant tenderness upon exam.  Scheduled  Meds:  Scheduled Meds: . aspirin  325 mg Oral Daily  . enoxaparin (LOVENOX) injection  40 mg Subcutaneous Q24H  . nicotine  14 mg Transdermal Daily  . pantoprazole (PROTONIX) IV  40 mg Intravenous Q12H  . sodium chloride  3 mL Intravenous Q12H  . thiamine  100 mg Oral Daily  . traZODone  50 mg Oral QHS   Continuous Infusions: . sodium chloride 150 mL/hr at 06/21/13 0759  . diltiazem (CARDIZEM) infusion 10 mg/hr (06/21/13 0758)  . piperacillin-tazobactam (ZOSYN)  IV    . vancomycin 1,250 mg (06/21/13 0903)    Data Reviewed: Basic Metabolic Panel:  Recent Labs Lab 06/21/13 0045 06/21/13 0056  NA 139 141  K 3.6* 3.5*  CL 107 108  CO2 18*  --   GLUCOSE 86 83  BUN 17 15  CREATININE 0.77 0.90  CALCIUM 8.1*  --    Liver Function Tests:  Recent Labs Lab 06/21/13 0045  AST 71*  ALT 148*  ALKPHOS 80  BILITOT 0.3  PROT 6.1  ALBUMIN 3.2*    Recent Labs Lab 06/21/13 0045  LIPASE 20   No results found for this basename: AMMONIA,  in the last 168 hours CBC:  Recent Labs Lab 06/21/13 0045 06/21/13 0056  WBC 6.3  --   NEUTROABS 3.3  --   HGB 12.8* 13.6  HCT 36.8* 40.0  MCV 94.8  --   PLT 186  --    Cardiac Enzymes: No results found for this basename: CKTOTAL, CKMB, CKMBINDEX, TROPONINI,  in the last 168 hours BNP (last 3 results) No results found for this basename: PROBNP,  in the last 8760 hours CBG: No results found for this basename: GLUCAP,  in the last 168 hours  No results found for this or any previous visit (from the past 240 hour(s)).   Studies:  Recent x-ray studies have been reviewed in detail by the Attending Physician       Erin Hearing, ANP [facility] Office  [phone] Pager [phone]   **If unable to reach the above provider after paging please contact the [facility] @ [phone]  On-Call/Text Page:      [email or web address]      password [email or web address]  If 7PM-7AM, please contact night-coverage [email or web address] Password  [facility] 06/21/2013, 12:53 PM   LOS: 0 days   Examined patient with ANP Ebony Hail discussed assessment and plan, and agree with the above plan.  All questions from patient and family were answered.  Greater than 35 minutes were used in direct patient care of this complicated patient

## 2013-06-21 NOTE — ED Notes (Signed)
Dr. Patel at bedside 

## 2013-06-21 NOTE — ED Notes (Signed)
Pt still in Nuc MEd

## 2013-06-21 NOTE — ED Notes (Signed)
Pt returned from Nuc Med

## 2013-06-21 NOTE — ED Notes (Signed)
Pt in Nuc Med

## 2013-06-21 NOTE — ED Provider Notes (Signed)
CSN: [id number]     Arrival date & time 06/21/13  0028 History   First MD Initiated Contact with Patient 06/21/13 0046     Chief Complaint  Patient presents with  . Abdominal Pain  . Tachycardia     (Consider location/radiation/quality/duration/timing/severity/associated sxs/prior Treatment) HPI History per patient. Right upper quadrant abdominal pain with nausea and vomiting onset yesterday. Pain is sharp in quality without radiation. No fevers or chills. No diarrhea. History of alcohol abuse. Denies known history of gallbladder problems. Brought in by EMS tachycardic, systolic blood pressure 100. Patient had been having some cough intermittent dark green sputum production. No hemoptysis.  Past Medical History  Diagnosis Date  . Hypertension   . Alcoholism /alcohol abuse   . Anxiety and depression   . Tobacco abuse    Past Surgical History  Procedure Laterality Date  . Hernia repair     Family History  Problem Relation Age of Onset  . Heart attack Mother   . Heart attack Father   . Hypertension Mother   . Hypertension Father    History  Substance Use Topics  . Smoking status: Current Every Day Smoker -- 1.00 packs/day for 30 years    Types: Cigarettes  . Smokeless tobacco: Never Used  . Alcohol Use: 0.0 oz/week     Comment: Drinks every day, 9-11 40 oz beers.     Review of Systems  Constitutional: Negative for fever and chills.  Respiratory: Positive for cough. Negative for shortness of breath.   Cardiovascular: Negative for chest pain.  Gastrointestinal: Positive for vomiting and abdominal pain.  Genitourinary: Negative for dysuria.  Musculoskeletal: Negative for back pain, neck pain and neck stiffness.  Skin: Negative for rash.  Neurological: Negative for headaches.  All other systems reviewed and are negative.     Allergies  Bee venom  Home Medications   Prior to Admission medications   Medication Sig Start Date End Date Taking? Authorizing Provider   amiodarone (PACERONE) 400 MG tablet Take 0.5 tablets (200 mg total) by mouth 2 (two) times daily. 05/15/13   Verne Spurr, PA-C  apixaban (ELIQUIS) 5 MG TABS tablet Take 1 tablet (5 mg total) by mouth 2 (two) times daily. 05/15/13   Verne Spurr, PA-C  aspirin 325 MG tablet Take 650 mg by mouth every 6 (six) hours as needed (pain).    Historical Provider, MD  FLUoxetine (PROZAC) 20 MG capsule Take 1 capsule (20 mg total) by mouth daily. 05/15/13   Verne Spurr, PA-C  metoprolol tartrate (LOPRESSOR) 25 MG tablet Take 0.5 tablets (12.5 mg total) by mouth 2 (two) times daily. 05/15/13   Verne Spurr, PA-C  traZODone (DESYREL) 50 MG tablet Take 1 tablet (50 mg total) by mouth at bedtime. 05/15/13   Verne Spurr, PA-C   BP 101/72  Pulse 131  Temp(Src) 97.3 F (36.3 C) (Rectal)  Resp 22  Ht 5\' 5"  (1.651 m)  Wt 202 lb (91.627 kg)  BMI 33.61 kg/m2  SpO2 91% Physical Exam  Constitutional: He is oriented to person, place, and time. He appears well-developed and well-nourished.  HENT:  Head: Normocephalic and atraumatic.  Eyes: EOM are normal. Pupils are equal, round, and reactive to light. No scleral icterus.  Neck: Neck supple.  Cardiovascular: Intact distal pulses.   Rapid irregular  Pulmonary/Chest: Effort normal and breath sounds normal. No respiratory distress. He exhibits no tenderness.  Abdominal: Soft. He exhibits no mass.  Tender right upper quadrant with guarding and positive Murphy's sign  Musculoskeletal:  Normal range of motion. He exhibits no edema.  Neurological: He is alert and oriented to person, place, and time.  Skin: Skin is warm and dry.    ED Course  Procedures (including critical care time) Labs Review Labs Reviewed  CBC WITH DIFFERENTIAL - Abnormal; Notable for the following:    RBC 3.88 (*)    Hemoglobin 12.8 (*)    HCT 36.8 (*)    All other components within normal limits  COMPREHENSIVE METABOLIC PANEL - Abnormal; Notable for the following:    Potassium 3.6  (*)    CO2 18 (*)    Calcium 8.1 (*)    Albumin 3.2 (*)    AST 71 (*)    ALT 148 (*)    All other components within normal limits  I-STAT CHEM 8, ED - Abnormal; Notable for the following:    Potassium 3.5 (*)    All other components within normal limits  LIPASE, BLOOD  URINALYSIS, ROUTINE W REFLEX MICROSCOPIC  I-STAT TROPOININ, ED  I-STAT CG4 LACTIC ACID, ED    Imaging Review Dg Chest Portable 1 View  06/21/2013   CLINICAL DATA:  ABDOMINAL PAIN  EXAM: PORTABLE CHEST - 1 VIEW  COMPARISON:  DG CHEST 1V PORT dated 05/05/2013  FINDINGS: Cardiac silhouette is enlarged. There is mild prominence of the interstitial markings and mild peribronchial cuffing. No focal region of consolidation or focal infiltrates. No acute osseous abnormalities.  IMPRESSION: Mild pulmonary edema.   Electronically Signed   By: Salome HolmesHector  Cooper M.D.   On: 06/21/2013 01:17   [place] Abdomen Limited Ruq  06/21/2013   CLINICAL DATA:  Right upper quadrant abdominal pain.  EXAM: US ABDOMEN LIMITED - RIGHT UPPER QUADRANT  COMPARISON:  No priors.  FINDINGS: Gallbladder:  Echogenic material layering dependently in the gallbladder, compatible with biliary sludge. No definite gallstones. Gallbladder wall appears thickened measuring 6 mm. Gallbladder is only mildly distended. Trace amount of pericholecystic fluid. Per report from the sonographer, the patient did exhibit a sonographic Murphy's sign on examination.  Common bile duct:  Diameter: Normal in caliber measuring 2.7 mm in the porta hepatis.  Liver:  No focal lesion identified. Within normal limits in parenchymal echogenicity.  Trace right pleural effusion.  IMPRESSION: 1. Biliary sludge in the gallbladder, which demonstrates a thickened gallbladder wall with trace amount of pericholecystic fluid and positive Murphy's sign on examination. Findings are suspicious for acute cholecystitis, however, the gallbladder is not significantly distended at this time. Clinical correlation is  recommended. 2. Small right pleural effusion.   Electronically Signed   By: Trudie Reedaniel  Entrikin M.D.   On: 06/21/2013 04:37     EKG Interpretation   Date/Time:  Friday Jun 21 2013 00:30:56 EDT Ventricular Rate:  152 PR Interval:    QRS Duration: 78 QT Interval:  277 QTC Calculation: 440 R Axis:   41 Text Interpretation:  Confirmed by Dierdre HighmanPITZ  MD, Nou Chard ([id number]) on 06/21/2013  2:21:06 AM     CRITICAL CARE Performed by: Sunnie NielsenBrian Mailey Landstrom Total critical care time: 60 Critical care time was exclusive of separately billable procedures and treating other patients. Critical care was necessary to treat or prevent imminent or life-threatening deterioration. Critical care was time spent personally by me on the following activities: development of treatment plan with patient and/or surrogate as well as nursing, discussions with consultants, evaluation of patient's response to treatment, examination of patient, obtaining history from patient or surrogate, ordering and performing treatments and interventions, ordering and review of laboratory studies, ordering  and review of radiographic studies, pulse oximetry and re-evaluation of patient's condition.   IVFs. IV cardizem bolus/ drip. IV ABx  5:02 AM discussed with [facility], DR Ezzard Standing, plan MED admit, [facility] to follow 5:05 AM discussed with PCCM, DR Deterding, plan admit SDU 5:23 AM d/w Dr Allena Katz, plan MED admit MDM   Diagnosis: Acalculous cholecystitis, rapid A. Fib/aflutter  Soft blood pressure, rapid A. Fib/ flutter, tender right upper quadrant with acalculous cholecystitis on ultrasound as above. IV fluids. IV Zosyn. Discussed with general surgery, critical care and [facility], Medical admission step down unit.     [name], MD 06/21/13 [phone]

## 2013-06-21 NOTE — Progress Notes (Addendum)
ANTIBIOTIC CONSULT NOTE - INITIAL  Pharmacy Consult for vancomycin and zosyn  Indication: sepsis   Allergies  Allergen Reactions  . Bee Venom Anaphylaxis    Uses epi pen-- last used 08/23/11    Patient Measurements: Height: 5\' 5"  (165.1 cm) Weight: 202 lb (91.627 kg) IBW/kg (Calculated) : 61.5 Adjusted Body Weight: \  Vital Signs: Temp: 97.3 F (36.3 C) (05/22 0052) Temp src: Rectal (05/22 0052) BP: 119/76 mmHg (05/22 0645) Pulse Rate: 103 (05/22 0615) Intake/Output from previous day: 05/21 0701 - 05/22 0700 In: 2000 [I.V.:2000] Out: 475 [Urine:475] Intake/Output from this shift:    Labs:  Recent Labs  06/21/13 0045 06/21/13 0056  WBC 6.3  --   HGB 12.8* 13.6  PLT 186  --   CREATININE 0.77 0.90   Estimated Creatinine Clearance: 104.4 ml/min (by C-G formula based on Cr of 0.9). No results found for this basename: VANCOTROUGH, VANCOPEAK, VANCORANDOM, GENTTROUGH, GENTPEAK, GENTRANDOM, TOBRATROUGH, TOBRAPEAK, TOBRARND, AMIKACINPEAK, AMIKACINTROU, AMIKACIN,  in the last 72 hours   Microbiology: No results found for this or any previous visit (from the past 720 hour(s)).  Medical History: Past Medical History  Diagnosis Date  . Hypertension   . Alcoholism /alcohol abuse   . Anxiety and depression   . Tobacco abuse     Medications:   (Not in a hospital admission) Assessment: Sepsis in setting of afib etoh abuse tobacco + with recent admission for afib rvr. vanc and zosyn for empiric coverage   Goal of Therapy:  Vancomycin trough level 15-20 mcg/ml   Plan:  Vancomycin 1250 mg q12h  Zosyn 3.375gm q8h    Janice Coffin 06/21/2013,7:30 AM  5/23 Addendum: Given patient's renal function and weight, will adjust vancomycin to 1000 mg IV q8h.  Will continue to monitor renal function, levels as needed. F/u antibiotic plans, length of therapy, cultures, clinical progression  Terrence Wishon C. Emelio Schneller, PharmD Clinical Pharmacist-Resident Pager:  [phone] Pharmacy: [phone] 06/22/2013 1:55 PM

## 2013-06-22 DIAGNOSIS — F17200 Nicotine dependence, unspecified, uncomplicated: Secondary | ICD-10-CM

## 2013-06-22 DIAGNOSIS — F341 Dysthymic disorder: Secondary | ICD-10-CM

## 2013-06-22 DIAGNOSIS — Z9119 Patient's noncompliance with other medical treatment and regimen: Secondary | ICD-10-CM

## 2013-06-22 DIAGNOSIS — Z91199 Patient's noncompliance with other medical treatment and regimen due to unspecified reason: Secondary | ICD-10-CM

## 2013-06-22 DIAGNOSIS — J9600 Acute respiratory failure, unspecified whether with hypoxia or hypercapnia: Secondary | ICD-10-CM

## 2013-06-22 DIAGNOSIS — J810 Acute pulmonary edema: Secondary | ICD-10-CM

## 2013-06-22 LAB — COMPREHENSIVE METABOLIC PANEL
ALBUMIN: 2.5 g/dL — AB (ref 3.5–5.2)
ALT: 99 U/L — AB (ref 0–53)
AST: 36 U/L (ref 0–37)
Alkaline Phosphatase: 65 U/L (ref 39–117)
BUN: 10 mg/dL (ref 6–23)
CALCIUM: 7.8 mg/dL — AB (ref 8.4–10.5)
CO2: 18 meq/L — AB (ref 19–32)
CREATININE: 0.84 mg/dL (ref 0.50–1.35)
Chloride: 111 mEq/L (ref 96–112)
GFR calc Af Amer: >90 mL/min (ref >90)
GFR calc non Af Amer: >90 mL/min (ref >90)
Glucose, Bld: 87 mg/dL (ref 70–99)
Potassium: 4.3 mEq/L (ref 3.7–5.3)
SODIUM: 138 meq/L (ref 137–147)
TOTAL PROTEIN: 5.1 g/dL — AB (ref 6.0–8.3)
Total Bilirubin: 0.7 mg/dL (ref 0.3–1.2)

## 2013-06-22 LAB — CBC
HEMATOCRIT: 38.9 % — AB (ref 39.0–52.0)
Hemoglobin: 12.8 g/dL — ABNORMAL LOW (ref 13.0–17.0)
MCH: 31.9 pg (ref 26.0–34.0)
MCHC: 32.9 g/dL (ref 30.0–36.0)
MCV: 97 fL (ref 78.0–100.0)
Platelets: 151 10*3/uL (ref 150–400)
RBC: 4.01 MIL/uL — AB (ref 4.22–5.81)
RDW: 14 % (ref 11.5–15.5)
WBC: 4.5 10*3/uL (ref 4.0–10.5)

## 2013-06-22 LAB — URINE CULTURE
CULTURE: NO GROWTH
Colony Count: NO GROWTH

## 2013-06-22 LAB — GLUCOSE, CAPILLARY: GLUCOSE-CAPILLARY: 123 mg/dL — AB (ref 70–99)

## 2013-06-22 LAB — EXPECTORATED SPUTUM ASSESSMENT W REFEX TO RESP CULTURE

## 2013-06-22 LAB — EXPECTORATED SPUTUM ASSESSMENT W GRAM STAIN, RFLX TO RESP C

## 2013-06-22 MED ORDER — VANCOMYCIN HCL IN DEXTROSE 1-5 GM/200ML-% IV SOLN
1000.0000 mg | Freq: Three times a day (TID) | INTRAVENOUS | Status: DC
  Filled 2013-06-22 (×2): qty 200

## 2013-06-22 NOTE — Progress Notes (Signed)
[place] TEAM 1 - Stepdown/ICU TEAM Progress Note  [name] LOV:[id number] DOB: Jan 21, 1966 DOA: 06/21/2013 PCP: No PCP Per Patient  Admit HPI / Brief Narrative: 48yo  WM PMHx atrial fibrillation followed by Dr. Terrence Dupont as an outpatient. He also has a history of alcohol abuse and was admitted in April of this year for inpatient detoxification and treatment. Patient endorsed that since that hospitalization he has not had any alcohol intake. He did mention that prior to admission he had not been compliant with many of his medications including his eliquis. He endorsed he was unable to afford the eliquis. He presented to the hospital today because of recurrent vomiting with streaky bright red blood and diarrhea for 4-5 times over the past 24 hours and acid reflux symptoms the he apparently has been having ongoing abdominal pain especially after eating for several weeks.  Upon presentation to the emergency department he was found to be in atrial fibrillation with rapid ventricular response. He was tender in the right upper quadrant upon examination. His AST and ALT were elevated although total bilirubin and alkaline phosphatase were normal. He was afebrile and normotensive upon presentation but as noted his heart rate was rapid anywhere between 130 in 140 beats per minute.  5/22Patient continues to endorse right upper quadrant abdominal pain. No shortness of breath or chest pain. Endorses has not had any alcohol intake since beginning in April.     HPI/Subjective: 5/23 states improved from admission but still has some residual pain in the right upper quadrant of his abdomen. Has been able to consume clear liquids without N./V. Negative CP negative SOB  Assessment/Plan: Atrial fibrillation with RVR  -5/23 rate controlled with IV Cardizem; continue until patient seen by GI (surgery?) -Continue to hold Amiodarone given elevated LFTs (beginning to normalize)  -Eliquis on hold due to reports of  hematemsis pre admit   Sepsis  -seems more dehydrated than septic; acalculus cholecystitis , DC antibiotic    Acute acalculous cholecystitis  -unclear picture  -sx's could either be from ETOH/cirrhosis or cholecystitis  -TB and alk phos normal  -HIDA scan without evidence of acute cholecystitis so consider upper GI etiology (see below)  -follow labs  -5/20 3 GI consulted -Patient stable for movement out of stepdown  Dehydration  -cont IVFs  -likely reason for RVR (and noncompliance with meds?)    Acute respiratory failure with hypoxia/Acute pulmonary edema  -suspect RVR as etiology  -ECHO April 2015 with normal LV fnx and no DD and only mild pulmonary HTN  -rate controled, on Cardizem IV will wait to switched to by mouth until seen by GI -CXR with only mild edema   Hematemesis  -has not recurred since admit  -HIDA negative  -H.Pylori serolgies pending -cont PPI   Alcoholism /alcohol abuse  -pt admanet has not had any ETOH in >1 month  -montor  -no coagulopathy or thrombocytopenia   Hypertension  -BP remains soft   Tobacco abuse  -Nicotine patch  MDD (major depressive disorder) -Continue trazodone 50 mg QHS   Patient nonadherence  -partially due to cost of meds  -Discussed with patient and significant other need to adhere to utilization of medications especially anticoagulation in patient with atrial fibrillation given patient has high risk for stroke    Code Status: FULL Family Communication: no family present at time of exam Disposition Plan: Per GI   Consultants: Dr.Matthew K. Tsuei, ([facility])    Procedure/Significant Events: 5/22 PCXR-Mild pulmonary edema.  5/22 ultrasound abdomen limited -  Biliary sludge in the gallbladder, thickened gallbladder wall; trace amount of pericholecystic fluid and  positive Murphy's sign on examination.   -Small right pleural effusion.  5/22 HIDA scan Patent biliary system. No scintigraphic evidence acute  cholecystitis  Culture 5/22 blood right/left arm NTD 5/22 urine negative (final)  Antibiotics: Zosyn 5/22 >>> stopped 5/23 Vancomycin 5/22 >>> stopped 5/23   DVT prophylaxis: Lovenox   Devices NA   LINES / TUBES:  5/22 20 GA right hand 5/22 20 GA left hand    Continuous Infusions: . sodium chloride 150 mL/hr at 06/22/13 0300  . diltiazem (CARDIZEM) infusion Stopped (06/21/13 2100)    Objective: VITAL SIGNS: Temp: 97.6 F (36.4 C) (05/23 0723) Temp src: Oral (05/23 0723) BP: 122/80 mmHg (05/23 0800) Pulse Rate: 77 (05/23 1000) SPO2; FIO2:   Intake/Output Summary (Last 24 hours) at 06/22/13 1049 Last data filed at 06/22/13 1002  Gross per 24 hour  Intake 5292.25 ml  Output   3126 ml  Net 2166.25 ml     Exam: General: A./O. x4, NAD, No acute respiratory distress Lungs: Clear to auscultation bilaterally without wheezes or crackles Cardiovascular: Irregular irregular rhythm and rate (when active; NSR when laying still), negative murmur gallop or rub normal S1 and S2 Abdomen: Positive tender RUQ, nondistended, soft, bowel sounds positive, no rebound, no ascites, no appreciable mass Extremities: No significant cyanosis, clubbing, or edema bilateral lower extremities  Data Reviewed: Basic Metabolic Panel:  Recent Labs Lab 06/21/13 0045 06/21/13 0056 06/22/13 0337  NA 139 141 138  K 3.6* 3.5* 4.3  CL 107 108 111  CO2 18*  --  18*  GLUCOSE 86 83 87  BUN 17 15 10   CREATININE 0.77 0.90 0.84  CALCIUM 8.1*  --  7.8*   Liver Function Tests:  Recent Labs Lab 06/21/13 0045 06/22/13 0337  AST 71* 36  ALT 148* 99*  ALKPHOS 80 65  BILITOT 0.3 0.7  PROT 6.1 5.1*  ALBUMIN 3.2* 2.5*    Recent Labs Lab 06/21/13 0045  LIPASE 20   No results found for this basename: AMMONIA,  in the last 168 hours CBC:  Recent Labs Lab 06/21/13 0045 06/21/13 0056 06/22/13 0337  WBC 6.3  --  4.5  NEUTROABS 3.3  --   --   HGB 12.8* 13.6 12.8*  HCT 36.8* 40.0  38.9*  MCV 94.8  --  97.0  PLT 186  --  151   Cardiac Enzymes: No results found for this basename: CKTOTAL, CKMB, CKMBINDEX, TROPONINI,  in the last 168 hours BNP (last 3 results) No results found for this basename: PROBNP,  in the last 8760 hours CBG:  Recent Labs Lab 06/22/13 0024  GLUCAP 123*    Recent Results (from the past 240 hour(s))  MRSA PCR SCREENING     Status: None   Collection Time    06/21/13  4:36 PM      Result Value Ref Range Status   MRSA by PCR NEGATIVE  NEGATIVE Final   Comment:            The GeneXpert MRSA Assay (FDA     approved for NASAL specimens     only), is one component of a     comprehensive MRSA colonization     surveillance program. It is not     intended to diagnose MRSA     infection nor to guide or     monitor treatment for     MRSA infections.  Studies:  Recent x-ray studies have been reviewed in detail by the Attending Physician  Scheduled Meds:  Scheduled Meds: . aspirin  325 mg Oral Daily  . enoxaparin (LOVENOX) injection  40 mg Subcutaneous Q24H  . nicotine  14 mg Transdermal Daily  . pantoprazole (PROTONIX) IV  40 mg Intravenous Q12H  . piperacillin-tazobactam (ZOSYN)  IV  3.375 g Intravenous 3 times per day  . sodium chloride  3 mL Intravenous Q12H  . thiamine  100 mg Oral Daily  . traZODone  50 mg Oral QHS  . vancomycin  1,250 mg Intravenous Q12H    Time spent on care of this patient: 40 mins   Allie Bossier , MD   [facility] Office  [phone] Pager [phone]  On-Call/Text Page:      [email or web address]      password [email or web address]  If 7PM-7AM, please contact night-coverage [email or web address] Password [email or web address] 06/22/2013, 10:49 AM   LOS: 1 day

## 2013-06-23 ENCOUNTER — Inpatient Hospital Stay (HOSPITAL_COMMUNITY): Payer: Self-pay

## 2013-06-23 DIAGNOSIS — R1011 Right upper quadrant pain: Secondary | ICD-10-CM | POA: Diagnosis present

## 2013-06-23 DIAGNOSIS — I4891 Unspecified atrial fibrillation: Principal | ICD-10-CM

## 2013-06-23 DIAGNOSIS — K922 Gastrointestinal hemorrhage, unspecified: Secondary | ICD-10-CM

## 2013-06-23 DIAGNOSIS — F1020 Alcohol dependence, uncomplicated: Secondary | ICD-10-CM

## 2013-06-23 MED ORDER — LEVOFLOXACIN 750 MG PO TABS
750.0000 mg | ORAL_TABLET | Freq: Every day | ORAL | Status: DC
  Administered 2013-06-23 – 2013-06-27 (×5): 750 mg via ORAL
  Filled 2013-06-23 (×5): qty 1

## 2013-06-23 MED ORDER — METOPROLOL TARTRATE 12.5 MG HALF TABLET
12.5000 mg | ORAL_TABLET | Freq: Two times a day (BID) | ORAL | Status: DC
  Administered 2013-06-23 – 2013-06-24 (×3): 12.5 mg via ORAL
  Filled 2013-06-23 (×5): qty 1

## 2013-06-23 MED ORDER — ALBUTEROL SULFATE (2.5 MG/3ML) 0.083% IN NEBU: 2.5000 mg | INHALATION_SOLUTION | RESPIRATORY_TRACT | Status: DC | PRN

## 2013-06-23 MED ORDER — ACETAMINOPHEN 325 MG PO TABS
650.0000 mg | ORAL_TABLET | Freq: Four times a day (QID) | ORAL | Status: DC | PRN
  Administered 2013-06-24 – 2013-06-26 (×2): 650 mg via ORAL
  Filled 2013-06-23 (×2): qty 2

## 2013-06-23 MED ORDER — FLUOXETINE HCL 20 MG PO CAPS
20.0000 mg | ORAL_CAPSULE | Freq: Every day | ORAL | Status: DC
  Administered 2013-06-23 – 2013-06-27 (×5): 20 mg via ORAL
  Filled 2013-06-23 (×5): qty 1

## 2013-06-23 NOTE — Consult Note (Signed)
Referring Provider: [facility] Primary Care Physician:  No PCP Per Patient Primary Gastroenterologist:  unassigned  Reason for Consultation:  Abdominal pain, nausea.vomiting, ?hematemesis  HPI: [name] is a 48 y.o. male and was on 06/21/2013 through the emergency room where he presented with complaints of recurrent vomiting and some streaky blood in his emesis, abdominal pain after eating and diarrhea. He was found to be in atrial fib with rapid ventricular rate in the 130s to 140s. Patient has history of alcoholism and very frequent ER visits with alcohol withdrawal and alcohol intoxication. He also had an admission in April of 2015 with atrial fibrillation and then went to behavioral health for alcohol detoxification after that. He says he has not been compliant with meds including Ellik was because he could not afford him. He has however been off of alcohol since the behavioral health admission in April. 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 Workup since admission with upper abdominal ultrasound showed sludge in the gallbladder gallbladder wall of 6 mm there is trace pericholecystic fluid liver appears normal common bile duct of 2.7 mm. Subsequent HIDA scan was negative. Chest x-ray on 522 showed mild pulmonary edema. Patient has been evaluated by surgery he does not feel that he has a calculus cholecystitis. He had continued to complain of some right-sided abdominal pain but she tells me hurts when he moves. He says he is still having some loose stools 3-4 bowel movements per day has not seen any blood. His nausea and vomiting have resolved and his abdominal pain has improved as well. He says he is hungry. He denies any dysphagia or diet aphasia occasionally has heartburn as an outpatient. He denies any regular aspirin or NSAID use. Labs on 523 showed hemoglobin 12.8 hematocrit 38.8 platelets 151 LFTs normal with the exception of an ALT of 99  albumin is 2.5 acute hepatitis panel is negative.   Past Medical History  Diagnosis Date  . Hypertension   . Alcoholism /alcohol abuse   . Anxiety and depression   . Tobacco abuse   . Atrial fibrillation     Past Surgical History  Procedure Laterality Date  . Hernia repair      UHR x2    Prior to Admission medications   Medication Sig Start Date End Date Taking? Authorizing Provider  amiodarone (PACERONE) 400 MG tablet Take 0.5 tablets (200 mg total) by mouth 2 (two) times daily. 05/15/13   Verne Spurr, PA-C  apixaban (ELIQUIS) 5 MG TABS tablet Take 1 tablet (5 mg total) by mouth 2 (two) times daily. 05/15/13   Verne Spurr, PA-C  aspirin 325 MG tablet Take 650 mg by mouth every 6 (six) hours as needed (pain).    Historical Provider, MD  FLUoxetine (PROZAC) 20 MG capsule Take 1 capsule (20 mg total) by mouth daily. 05/15/13   Verne Spurr, PA-C  metoprolol tartrate (LOPRESSOR) 25 MG tablet Take 0.5 tablets (12.5 mg total) by mouth 2 (two) times daily. 05/15/13   Verne Spurr, PA-C  traZODone (DESYREL) 50 MG tablet Take 1 tablet (50 mg total) by mouth at bedtime. 05/15/13   Verne Spurr, PA-C    Current Facility-Administered Medications  Medication Dose Route Frequency Provider Last Rate Last Dose  . albuterol (PROVENTIL) (2.5 MG/3ML) 0.083% nebulizer solution 2.5 mg  2.5 mg Nebulization Q2H PRN Elease Etienne, MD      . aspirin tablet 325 mg  325 mg Oral Daily Lynden Oxford, MD  325 mg at 06/22/13 1009  . diltiazem (CARDIZEM) 100 mg in dextrose 5 % 100 mL infusion  5-15 mg/hr Intravenous Titrated [name], MD   10 mg/hr at 06/21/13 1315  . enoxaparin (LOVENOX) injection 40 mg  40 mg Subcutaneous Q24H Lynden Oxford, MD   40 mg at 06/23/13 0603  . nicotine (NICODERM CQ - dosed in mg/24 hours) patch 14 mg  14 mg Transdermal Daily Lynden Oxford, MD   14 mg at 06/23/13 1039  . ondansetron (ZOFRAN) tablet 4 mg  4 mg Oral Q6H PRN Lynden Oxford, MD       Or  . ondansetron (ZOFRAN)  injection 4 mg  4 mg Intravenous Q6H PRN Lynden Oxford, MD      . pantoprazole (PROTONIX) injection 40 mg  40 mg Intravenous Q12H Lynden Oxford, MD   40 mg at 06/23/13 1039  . sodium chloride 0.9 % injection 3 mL  3 mL Intravenous Q12H Lynden Oxford, MD   3 mL at 06/23/13 1041  . thiamine (VITAMIN B-1) tablet 100 mg  100 mg Oral Daily Lynden Oxford, MD   100 mg at 06/23/13 1041  . traZODone (DESYREL) tablet 50 mg  50 mg Oral QHS Lynden Oxford, MD   50 mg at 06/22/13 2156    Allergies as of 06/21/2013 - Review Complete 06/21/2013  Allergen Reaction Noted  . Bee venom Anaphylaxis 08/24/2011    Family History  Problem Relation Age of Onset  . Heart attack Mother   . Heart attack Father   . Hypertension Mother   . Hypertension Father     History   Social History  . Marital Status: Single    Spouse Name: N/A    Number of Children: N/A  . Years of Education: N/A   Occupational History  . Not on file.   Social History Main Topics  . Smoking status: Current Every Day Smoker -- 1.00 packs/day for 30 years    Types: Cigarettes  . Smokeless tobacco: Never Used  . Alcohol Use: Yes     Comment: Drinks every day, 9-11 40 oz beers.   . Drug Use: No  . Sexual Activity: Yes    Birth Control/ Protection: Condom   Other Topics Concern  . Not on file   Social History Narrative   He is currently homeless and sleeps in an abandoned truck in sleeping bags. He works part time for [employer]. He has a 70 year old daughter who lives in [place]. He is originally from [place].     Review of Systems: Complete ROS negative except as in HPI Physical Exam: Vital signs in last 24 hours: Temp:  [97.9 F (36.6 C)-98.3 F (36.8 C)] 97.9 F (36.6 C) (05/24 0522) Pulse Rate:  [68-83] 68 (05/24 0522) Resp:  [19-25] 20 (05/24 0522) BP: (117-134)/(77-98) 117/77 mmHg (05/24 0522) SpO2:  [95 %-98 %] 95 % (05/24 0522) Weight:  [196 lb 3.2 oz (88.996 kg)] 196 lb 3.2 oz (88.996 kg) (05/23  2138) Last BM Date: 06/22/13 General:   Alert,  Well-developed, well-nourished, pleasant and cooperative in NAD Head:  Normocephalic and atraumatic. Eyes:  Sclera clear, no icterus.   Conjunctiva pink. Ears:  Normal auditory acuity. Nose:  No deformity, discharge,  or lesions. Mouth:  No deformity or lesions.   Neck:  Supple; no masses or thyromegaly. Lungs:  Clear throughout to auscultation.   No wheezes, crackles, or rhonchi.  Heart:  irregular rate and rhythm; no murmurs, clicks, rubs,  or gallops. Abdomen:  Soft,nontender, BS active,nonpalp mass or hsm.  He is tender on lateral right side over ribs Rectal:  Deferred  Msk:  Symmetrical without gross deformities. . Pulses:  Normal pulses noted. Extremities:  Without clubbing or edema. Neurologic:  Alert and  oriented x4;  grossly normal neurologically. Skin:  Intact without significant lesions or rashes.. Psych:  Alert and cooperative. Normal mood and affect.  Intake/Output from previous day: 05/23 0701 - 05/24 0700 In: 5017.5 [P.O.:1920; I.V.:3097.5] Out: 2426 [Urine:2425; Stool:1] Intake/Output this shift:    Lab Results:  Recent Labs  06/21/13 0045 06/21/13 0056 06/22/13 0337  WBC 6.3  --  4.5  HGB 12.8* 13.6 12.8*  HCT 36.8* 40.0 38.9*  PLT 186  --  151   BMET  Recent Labs  06/21/13 0045 06/21/13 0056 06/22/13 0337  NA 139 141 138  K 3.6* 3.5* 4.3  CL 107 108 111  CO2 18*  --  18*  GLUCOSE 86 83 87  BUN 17 15 10   CREATININE 0.77 0.90 0.84  CALCIUM 8.1*  --  7.8*   LFT  Recent Labs  06/22/13 0337  PROT 5.1*  ALBUMIN 2.5*  AST 36  ALT 99*  ALKPHOS 65  BILITOT 0.7   PT/INR No results found for this basename: LABPROT, INR,  in the last 72 hours Hepatitis Panel  Recent Labs  06/21/13 1253  HEPBSAG NEGATIVE  HCVAB NEGATIVE  HEPAIGM NON REACTIVE  HEPBIGM NON REACTIVE     IMPRESSION:  #1  48 yo male with hx of alcoholism, abstinent over the past 2 months admitted 48 hours ago with atrial  fibrillation rapid ventricular rate, now controlled. Patient had also complained of upper abdominal pain postprandially nausea, vomiting scant streaky hematemesis and diarrhea -  surgery has seen and evaluated and ruled out a calculus cholecystitis. Patient is improving and has some residual right-sided abdominal pain which appears to be musculoskeletal. He may have gastritis or gastropathy, cannot rule out peptic ulcer disease however suspect he may have had more of an acute gastroenteritis with the nausea, vomiting and diarrhea. #2 depression #3 hypertension #4 atrial fibrillation  PLAN: #1 PPI daily  #2 advance diet #3 will discuss EGD but unlikely to change management   Amy S Esterwood  06/23/2013, 10:44 AM     Attending physician's note   I have taken a history, examined the patient and reviewed the chart. I agree with the Advanced Practitioner's note, impression and recommendations. Presumed acute gastroenteritis, possible gastritis. He steadily improving. PPI daily and advance diet. If his UGI symptoms are recurrent or refractory consider EGD.   Meryl DareMalcolm T Stark, MD Clementeen GrahamFACG

## 2013-06-23 NOTE — Progress Notes (Addendum)
PROGRESS NOTE    Robert OchsJohn [name] ZOX:[id number] DOB: 09/13/1965 DOA: 06/21/2013 PCP: No PCP Per Patient Cardiology: Dr. Rinaldo CloudMohan Harwani.  HPI/Brief narrative 48 year old male with history of A. fib, hypertension, alcohol abuse, tobacco abuse, anxiety & major depression, recently hospitalized for A. fib with RVR, found to have major depression & suicidal ideations and discharged to behavioral health Center from when he was discharged on 05/15/13 after alcohol detox. Since discharge from medical hospital, patient has been noncompliant with his medications. He was admitted on 06/21/13 with several episodes of vomiting with streaking of red blood, diarrhea, acid reflux, some dyspnea and productive cough, fever and chills. He stated no alcohol intake since discharge from behavioral health Center in April. He also complained of abdominal pain/RUQ. He was found to be in A. fib with RVR and concern for cholecystitis. She was initially admitted to step down and after stabilization transferred to telemetry on 5/23.    Assessment/Plan:  1. PAF with RVR: Likely precipitated by noncompliance, acute illness and? Alcohol abuse. Initially placed on Cardizem drip which has been weaned off. Patient has reverted to sinus rhythm with PACs. Patient had been discharged on amiodarone, metoprolol & Apixaban which he has not taken. Will not start amiodarone secondary to abnormal LFTs, alcohol abuse history and noncompliance. Resume low-dose metoprolol and patient advised regarding compliance. Hold anticoagulation until GI evaluation and recommendations. 2. RUQ abdominal pain and mildly abnormal LFTs: Abdominal pain improved. No further emesis. LFTs improved. Blood cultures x2 negative to date. HIDA scan negative. In the absence of fever of leukocytosis and clinical improvement, low index of suspicion for acute cholecystitis. Surgery consulted and have signed off. Unclear etiology-? Secondary to alcoholic hepatitis, R/O gastritis,  GERD, PUD-await GI input. 3. Upper GI bleed/? Acute GE with MW tear: Seems to have resolved. DD-gastritis, GERD, PUD, esophageal varices, NSAID-induced gastritis/ulcers. Currently on clear liquid diet. Continue PPI. GI consulted-will see. DC ASA & Lovenox. SCDs. Hemoglobin is stable suggesting insignificant bleed. 4. Tobacco abuse: Cessation counseled. Continue nicotine patch 5. History of alcohol abuse: Repeatedly states that he has not had any alcohol to drink since [facility]. 6. Dehydration on admission: Resolved. 7. Hypertension: Controlled. 8. History of major depression & anxiety: Patient has had several admissions to behavioral health Center. Denied suicidal or homicidal ideations. Patient had been initiated on Prozac 20 mg daily and trazodone 50 mg each bedtime for insomnia. Continue these medications. 9. Medical noncompliance: Counseled extensively. 10. Acute respiratory failure/possible HCAP/?COPD: Intermittent hypoxic range oxygen saturations since admission-? COPD. Chest x-ray from 5/24 suggests new right lower lung opacity concerning for pneumonia. Will start on oral levofloxacin (no fever, leukocytosis and patient does not look septic or toxic and hence we'll treat with PO rather than IV Abx). Will need followup chest x-ray to ensure resolution of pneumonia findings. No clinical features to suggest pulmonary edema.    Code Status: Full Family Communication: Discussed with patient. No family at bedside. Disposition Plan: Home when medically stable.   Consultants:  Gastroenterology  Procedures:  None  Antibiotics:  IV vancomycin and Zosyn 5/21 > 5/23   Subjective: Right upper quadrant abdominal pain has significantly improved. Denies nausea vomiting. Tolerated clears. Complains of chest congestion, postnasal drip and some wheezing.  Objective: Filed Vitals:   06/22/13 1238 06/22/13 1457 06/22/13 2138 06/23/13 0522  BP: 133/86 122/78 134/98 117/77    Pulse: 83 75 75 68  Temp: 98 F (36.7 C) 98 F (36.7 C) 98.3 F (36.8 C)  97.9 F (36.6 C)  TempSrc: Oral Oral Oral Oral  Resp: 25 24 19 20   Height:      Weight:   88.996 kg (196 lb 3.2 oz)   SpO2: 97% 98% 96% 95%    Intake/Output Summary (Last 24 hours) at 06/23/13 1038 Last data filed at 06/23/13 0600  Gross per 24 hour  Intake 4207.5 ml  Output   1975 ml  Net 2232.5 ml   Filed Weights   06/21/13 0032 06/21/13 1600 06/22/13 2138  Weight: 91.627 kg (202 lb) 86.6 kg (190 lb 14.7 oz) 88.996 kg (196 lb 3.2 oz)     Exam:  General exam: Pleasant young male lying comfortably in bed. Respiratory system: Fair breath sounds bilaterally with occasional bilateral expiratory rhonchi but no crackles. No increased work of breathing. Cardiovascular system: S1 & S2 heard, RRR. No JVD, murmurs, gallops, clicks or pedal edema. Telemetry: Sinus rhythm with few PACs. Gastrointestinal system: Abdomen is nondistended, soft and nontender. Normal bowel sounds heard. Central nervous system: Alert and oriented. No focal neurological deficits. Extremities: Symmetric 5 x 5 power.   Data Reviewed: Basic Metabolic Panel:  Recent Labs Lab 06/21/13 0045 06/21/13 0056 06/22/13 0337  NA 139 141 138  K 3.6* 3.5* 4.3  CL 107 108 111  CO2 18*  --  18*  GLUCOSE 86 83 87  BUN 17 15 10   CREATININE 0.77 0.90 0.84  CALCIUM 8.1*  --  7.8*   Liver Function Tests:  Recent Labs Lab 06/21/13 0045 06/22/13 0337  AST 71* 36  ALT 148* 99*  ALKPHOS 80 65  BILITOT 0.3 0.7  PROT 6.1 5.1*  ALBUMIN 3.2* 2.5*    Recent Labs Lab 06/21/13 0045  LIPASE 20   No results found for this basename: AMMONIA,  in the last 168 hours CBC:  Recent Labs Lab 06/21/13 0045 06/21/13 0056 06/22/13 0337  WBC 6.3  --  4.5  NEUTROABS 3.3  --   --   HGB 12.8* 13.6 12.8*  HCT 36.8* 40.0 38.9*  MCV 94.8  --  97.0  PLT 186  --  151   Cardiac Enzymes: No results found for this basename: CKTOTAL, CKMB,  CKMBINDEX, TROPONINI,  in the last 168 hours BNP (last 3 results) No results found for this basename: PROBNP,  in the last 8760 hours CBG:  Recent Labs Lab 06/22/13 0024  GLUCAP 123*    Recent Results (from the past 240 hour(s))  CULTURE, BLOOD (ROUTINE X 2)     Status: None   Collection Time    06/21/13  7:55 AM      Result Value Ref Range Status   Specimen Description BLOOD RIGHT ARM   Final   Special Requests BOTTLES DRAWN AEROBIC ONLY 10CC   Final   Culture  Setup Time     Final   Value: 06/21/2013 14:14     Performed at [facility]   Culture     Final   Value:        BLOOD CULTURE RECEIVED NO GROWTH TO DATE CULTURE WILL BE HELD FOR 5 DAYS BEFORE ISSUING A FINAL NEGATIVE REPORT     Performed at [facility]   Report Status PENDING   Incomplete  CULTURE, BLOOD (ROUTINE X 2)     Status: None   Collection Time    06/21/13  8:10 AM      Result Value Ref Range Status   Specimen Description BLOOD LEFT ARM   Final  Special Requests BOTTLES DRAWN AEROBIC ONLY [facility]   Final   Culture  Setup Time     Final   Value: 06/21/2013 14:14     Performed at [facility]   Culture     Final   Value:        BLOOD CULTURE RECEIVED NO GROWTH TO DATE CULTURE WILL BE HELD FOR 5 DAYS BEFORE ISSUING A FINAL NEGATIVE REPORT     Performed at [facility]   Report Status PENDING   Incomplete  URINE CULTURE     Status: None   Collection Time    06/21/13  8:26 AM      Result Value Ref Range Status   Specimen Description URINE, CLEAN CATCH   Final   Special Requests NONE   Final   Culture  Setup Time     Final   Value: 06/21/2013 14:43     Performed at [facility] Count     Final   Value: NO GROWTH     Performed at [facility]   Culture     Final   Value: NO GROWTH     Performed at [facility]   Report Status 06/22/2013 FINAL   Final  MRSA PCR SCREENING     Status: None   Collection Time    06/21/13  4:36 PM      Result  Value Ref Range Status   MRSA by PCR NEGATIVE  NEGATIVE Final   Comment:            The GeneXpert MRSA Assay (FDA     approved for NASAL specimens     only), is one component of a     comprehensive MRSA colonization     surveillance program. It is not     intended to diagnose MRSA     infection nor to guide or     monitor treatment for     MRSA infections.  CULTURE, EXPECTORATED SPUTUM-ASSESSMENT     Status: None   Collection Time    06/22/13  6:17 PM      Result Value Ref Range Status   Specimen Description SPUTUM   Final   Special Requests NONE   Final   Sputum evaluation     Final   Value: THIS SPECIMEN IS ACCEPTABLE. RESPIRATORY CULTURE REPORT TO FOLLOW.   Report Status 06/22/2013 FINAL   Final          Studies: Nm Hepatobiliary Liver Func  06/21/2013   CLINICAL DATA:  Abdominal pain, nausea, vomiting, question acalculus cholecystitis  EXAM: NUCLEAR MEDICINE HEPATOBILIARY IMAGING  TECHNIQUE: Sequential images of the abdomen were obtained out to 60 minutes following intravenous administration of radiopharmaceutical.  RADIOPHARMACEUTICALS:  5 mCi Tc-56m Choletec IV  COMPARISON:  Ultrasound abdomen limited RIGHT upper quadrant 06/21/2013  FINDINGS: Normal tracer extraction from bloodstream indicating normal hepatocellular function.  Few scattered artifacts from patient movement and retained tracer within IV tubing.  Prompt excretion of tracer into biliary tree.  Gallbladder visualized at 15 min.  Small bowel visualized at 22 min.  No evidence of cystic duct or CBD obstruction.  IMPRESSION: Patent biliary system.  No scintigraphic evidence acute cholecystitis.   Electronically Signed   By: Ulyses Southward M.D.   On: 06/21/2013 13:24   Dg Chest Port 1 View  06/23/2013   CLINICAL DATA:  Chest pain, cough and shortness of breath  EXAM: PORTABLE CHEST - 1 VIEW  COMPARISON:  06/21/2013 and prior chest radiographs  FINDINGS: Cardiomegaly is again noted.  Developing right lower lung opacity is  suspicious for pneumonia.  Peribronchial/interstitial opacities are again noted.  There is no evidence of pleural effusion, pneumothorax or acute bony abnormality.  IMPRESSION: Developing right lower lung opacity suspicious for pneumonia.   Electronically Signed   By: Laveda Abbe M.D.   On: 06/23/2013 09:09        Scheduled Meds: . aspirin  325 mg Oral Daily  . enoxaparin (LOVENOX) injection  40 mg Subcutaneous Q24H  . nicotine  14 mg Transdermal Daily  . pantoprazole (PROTONIX) IV  40 mg Intravenous Q12H  . sodium chloride  3 mL Intravenous Q12H  . thiamine  100 mg Oral Daily  . traZODone  50 mg Oral QHS   Continuous Infusions: . diltiazem (CARDIZEM) infusion Stopped (06/21/13 2100)    Active Problems:   Alcoholism /alcohol abuse   Tobacco abuse   Hypertension   MDD (major depressive disorder)   Atrial fibrillation with RVR   Acute acalculous cholecystitis   Sepsis   Patient nonadherence   Dehydration   Acute respiratory failure with hypoxia   Acute pulmonary edema    Time spent: 40 minutes    Elease Etienne, MD, FACP, [facility]. [facility] Pager [phone]  If 7PM-7AM, please contact night-coverage [email or web address] Password [email or web address] 06/23/2013, 10:38 AM    LOS: 2 days

## 2013-06-24 DIAGNOSIS — J189 Pneumonia, unspecified organism: Secondary | ICD-10-CM

## 2013-06-24 LAB — BASIC METABOLIC PANEL
BUN: 6 mg/dL (ref 6–23)
CO2: 25 mEq/L (ref 19–32)
CREATININE: 0.95 mg/dL (ref 0.50–1.35)
Calcium: 8.6 mg/dL (ref 8.4–10.5)
Chloride: 106 mEq/L (ref 96–112)
GLUCOSE: 81 mg/dL (ref 70–99)
Potassium: 4.2 mEq/L (ref 3.7–5.3)
Sodium: 141 mEq/L (ref 137–147)

## 2013-06-24 LAB — HEPARIN LEVEL (UNFRACTIONATED): HEPARIN UNFRACTIONATED: 0.24 [IU]/mL — AB (ref 0.30–0.70)

## 2013-06-24 LAB — PRO B NATRIURETIC PEPTIDE: Pro B Natriuretic peptide (BNP): 935.8 pg/mL — ABNORMAL HIGH (ref 0–125)

## 2013-06-24 LAB — MRSA PCR SCREENING: MRSA BY PCR: NEGATIVE

## 2013-06-24 MED ORDER — DILTIAZEM HCL 100 MG IV SOLR
5.0000 mg/h | INTRAVENOUS | Status: DC
  Administered 2013-06-24: 10 mg/h via INTRAVENOUS
  Filled 2013-06-24: qty 100

## 2013-06-24 MED ORDER — AMIODARONE HCL IN DEXTROSE 360-4.14 MG/200ML-% IV SOLN
30.0000 mg/h | INTRAVENOUS | Status: DC
  Administered 2013-06-24 – 2013-06-25 (×2): 30 mg/h via INTRAVENOUS
  Filled 2013-06-24 (×5): qty 200

## 2013-06-24 MED ORDER — PANTOPRAZOLE SODIUM 40 MG PO TBEC
40.0000 mg | DELAYED_RELEASE_TABLET | Freq: Two times a day (BID) | ORAL | Status: DC
  Administered 2013-06-24 – 2013-06-27 (×6): 40 mg via ORAL
  Filled 2013-06-24 (×4): qty 1

## 2013-06-24 MED ORDER — HEPARIN (PORCINE) IN NACL 100-0.45 UNIT/ML-% IJ SOLN
1350.0000 [IU]/h | INTRAMUSCULAR | Status: DC
  Administered 2013-06-24: 1350 [IU]/h via INTRAVENOUS
  Administered 2013-06-24: 1200 [IU]/h via INTRAVENOUS
  Administered 2013-06-25: 1350 [IU]/h via INTRAVENOUS
  Filled 2013-06-24 (×4): qty 250

## 2013-06-24 MED ORDER — DILTIAZEM LOAD VIA INFUSION
10.0000 mg | Freq: Once | INTRAVENOUS | Status: AC
  Administered 2013-06-24: 10 mg via INTRAVENOUS
  Filled 2013-06-24: qty 10

## 2013-06-24 MED ORDER — METOPROLOL TARTRATE 1 MG/ML IV SOLN
5.0000 mg | Freq: Four times a day (QID) | INTRAVENOUS | Status: DC | PRN
  Administered 2013-06-24: 5 mg via INTRAVENOUS
  Filled 2013-06-24: qty 5

## 2013-06-24 MED ORDER — METOPROLOL TARTRATE 25 MG PO TABS
25.0000 mg | ORAL_TABLET | Freq: Two times a day (BID) | ORAL | Status: DC
  Administered 2013-06-24 – 2013-06-26 (×4): 25 mg via ORAL
  Filled 2013-06-24 (×6): qty 1

## 2013-06-24 MED ORDER — AMIODARONE HCL IN DEXTROSE 360-4.14 MG/200ML-% IV SOLN
60.0000 mg/h | INTRAVENOUS | Status: AC
  Administered 2013-06-24: 60 mg/h via INTRAVENOUS
  Filled 2013-06-24: qty 200

## 2013-06-24 MED ORDER — AMIODARONE LOAD VIA INFUSION
150.0000 mg | Freq: Once | INTRAVENOUS | Status: AC
  Administered 2013-06-24: 150 mg via INTRAVENOUS
  Filled 2013-06-24: qty 83.34

## 2013-06-24 NOTE — Progress Notes (Signed)
PROGRESS NOTE    [name] ZOX:[id number] DOB: July 24, 1965 DOA: 06/21/2013 PCP: No PCP Per Patient Cardiology: Dr. Rinaldo Cloud.  HPI/Brief narrative 48 year old male with history of A. fib, hypertension, alcohol abuse, tobacco abuse, anxiety & major depression, recently hospitalized for A. fib with RVR, found to have major depression & suicidal ideations and discharged to behavioral health Center from when he was discharged on 05/15/13 after alcohol detox. Since discharge from medical hospital, patient has been noncompliant with his medications. He was admitted on 06/21/13 with several episodes of vomiting with streaking of red blood, diarrhea, acid reflux, some dyspnea and productive cough, fever and chills. He stated no alcohol intake since discharge from behavioral health Center in April. He also complained of abdominal pain/RUQ. He was found to be in A. fib with RVR and concern for cholecystitis. She was initially admitted to step down and after stabilization transferred to telemetry on 5/23. GI symptoms have resolved. Patient is tolerating regular diet. Gastroenterology has signed off and cleared for anticoagulation. Patient went into rapid A. fib on 5/25-transferring to step down unit. Cardiology/Dr. Sharyn Lull consulted.    Assessment/Plan:  1. PAF with RVR: Likely precipitated by noncompliance, acute illness and? Alcohol abuse. During initial hospitalization, he had been placed on IV Cardizem drip, had reverted to sinus rhythm and transferred to floor. Resumed by mouth metoprolol on 5/24. On 5/25, patient reverted to A. fib with RVR in the 150s. Transferred to step down it started on IV Cardizem drip. Cardiology consulted and recommend IV amiodarone (Cardizem can be discontinued when rate is better controlled) and IV heparin drip. Patient was not compliant with any of his medications (metoprolol, amiodarone or Eliquis) on recent discharge. Not sure if he can afford NOAC-case management  exploring. 2. RUQ abdominal pain and mildly abnormal LFTs: Abdominal pain improved. No further emesis. LFTs improved. Blood cultures x2 negative to date. HIDA scan negative. In the absence of fever of leukocytosis and clinical improvement, low index of suspicion for acute cholecystitis. Surgery consulted and have signed off. Unclear etiology-? Secondary to alcoholic hepatitis, R/O gastritis, GERD, PUD. GI input appreciated. Likely secondary to acute infectious GE-has resolved and patient is tolerating diet. Discussed with GI-okay to use amiodarone. 3. Upper GI bleed/? Acute GE with MW tear: Seems to have resolved. DD-gastritis, GERD, PUD, esophageal varices, NSAID-induced gastritis/ulcers. Stable hemoglobin argues against a large bleed. Continue PPI. GI has signed off and cleared for resumption of anticoagulation. May need evaluation i.e. EGD if has recurrent symptoms. 4. Tobacco abuse: Cessation counseled. Continue nicotine patch 5. History of alcohol abuse: Repeatedly states that he has not had any alcohol to drink since discharge from [facility] in mid April. 6. Dehydration on admission: Resolved. 7. Hypertension: Controlled. 8. History of major depression & anxiety: Patient has had several admissions to behavioral health Center. Denied suicidal or homicidal ideations. Patient had been initiated on Prozac 20 mg daily and trazodone 50 mg each bedtime for insomnia. Continue these medications. 9. Medical noncompliance: Counseled extensively. 10. Acute respiratory failure/possible HCAP/?COPD: Intermittent hypoxic range oxygen saturations since admission-? COPD. Chest x-ray from 5/24 suggests new right lower lung opacity concerning for pneumonia. Will start on oral levofloxacin (no fever, leukocytosis and patient does not look septic or toxic and hence we'll treat with PO rather than IV Abx). Will need followup chest x-ray to ensure resolution of pneumonia findings. No clinical features to suggest pulmonary edema.     Code Status: Full Family Communication: Discussed with patient. No family at bedside. Disposition  Plan: Transfer to step down unit. Not medically stable for discharge.   Consultants:  general surgery- signed off  Gastroenterology-signed off  Cardiology  Procedures:  None  Antibiotics:  IV vancomycin and Zosyn 5/21 > 5/23   Oral levofloxacin 5/24 >  Subjective: Tolerating regular diet. Denies abdominal pain, nausea, vomiting or abdominal pain. Went into rapid A. fib this morning but denies palpitations or chest pain.  Objective: Filed Vitals:   06/23/13 2138 06/24/13 0540 06/24/13 0844 06/24/13 1019  BP: 113/66 121/77 130/94 130/98  Pulse: 66 64 147 157  Temp: 98.3 F (36.8 C) 98.3 F (36.8 C) 98.8 F (37.1 C)   TempSrc: Oral Oral Oral   Resp: 19 17 20    Height:      Weight: 89.313 kg (196 lb 14.4 oz)     SpO2: 95% 96% 91%     Intake/Output Summary (Last 24 hours) at 06/24/13 1331 Last data filed at 06/24/13 1302  Gross per 24 hour  Intake    840 ml  Output   2426 ml  Net  -1586 ml   Filed Weights   06/21/13 1600 06/22/13 2138 06/23/13 2138  Weight: 86.6 kg (190 lb 14.7 oz) 88.996 kg (196 lb 3.2 oz) 89.313 kg (196 lb 14.4 oz)     Exam:  General exam: Pleasant young male lying comfortably in bed. Respiratory system: Slightly diminished breath sounds in the bases with occasional basal crackles but otherwise clear to auscultation. No increased work of breathing. Cardiovascular system: S1 & S2 heard, irregularly irregular and tachycardic. No JVD, murmurs, gallops, clicks or pedal edema. Telemetry: Sinus rhythm with few PACs until 8 AM on 5/25, then went into A. fib with RVR in the 150s. Gastrointestinal system: Abdomen is nondistended, soft and nontender. Normal bowel sounds heard. Central nervous system: Alert and oriented. No focal neurological deficits. Extremities: Symmetric 5 x 5 power.   Data Reviewed: Basic Metabolic Panel:  Recent Labs Lab  06/21/13 0045 06/21/13 0056 06/22/13 0337 06/24/13 0544  NA 139 141 138 141  K 3.6* 3.5* 4.3 4.2  CL 107 108 111 106  CO2 18*  --  18* 25  GLUCOSE 86 83 87 81  BUN 17 15 10 6   CREATININE 0.77 0.90 0.84 0.95  CALCIUM 8.1*  --  7.8* 8.6   Liver Function Tests:  Recent Labs Lab 06/21/13 0045 06/22/13 0337  AST 71* 36  ALT 148* 99*  ALKPHOS 80 65  BILITOT 0.3 0.7  PROT 6.1 5.1*  ALBUMIN 3.2* 2.5*    Recent Labs Lab 06/21/13 0045  LIPASE 20   No results found for this basename: AMMONIA,  in the last 168 hours CBC:  Recent Labs Lab 06/21/13 0045 06/21/13 0056 06/22/13 0337  WBC 6.3  --  4.5  NEUTROABS 3.3  --   --   HGB 12.8* 13.6 12.8*  HCT 36.8* 40.0 38.9*  MCV 94.8  --  97.0  PLT 186  --  151   Cardiac Enzymes: No results found for this basename: CKTOTAL, CKMB, CKMBINDEX, TROPONINI,  in the last 168 hours BNP (last 3 results)  Recent Labs  06/24/13 0544  PROBNP 935.8*   CBG:  Recent Labs Lab 06/22/13 0024  GLUCAP 123*    Recent Results (from the past 240 hour(s))  CULTURE, BLOOD (ROUTINE X 2)     Status: None   Collection Time    06/21/13  7:55 AM      Result Value Ref Range Status   Specimen  Description BLOOD RIGHT ARM   Final   Special Requests BOTTLES DRAWN AEROBIC ONLY 10CC   Final   Culture  Setup Time     Final   Value: 06/21/2013 14:14     Performed at [facility]   Culture     Final   Value:        BLOOD CULTURE RECEIVED NO GROWTH TO DATE CULTURE WILL BE HELD FOR 5 DAYS BEFORE ISSUING A FINAL NEGATIVE REPORT     Performed at [facility]   Report Status PENDING   Incomplete  CULTURE, BLOOD (ROUTINE X 2)     Status: None   Collection Time    06/21/13  8:10 AM      Result Value Ref Range Status   Specimen Description BLOOD LEFT ARM   Final   Special Requests BOTTLES DRAWN AEROBIC ONLY 9CC   Final   Culture  Setup Time     Final   Value: 06/21/2013 14:14     Performed at [facility]   Culture     Final    Value:        BLOOD CULTURE RECEIVED NO GROWTH TO DATE CULTURE WILL BE HELD FOR 5 DAYS BEFORE ISSUING A FINAL NEGATIVE REPORT     Performed at [facility]   Report Status PENDING   Incomplete  URINE CULTURE     Status: None   Collection Time    06/21/13  8:26 AM      Result Value Ref Range Status   Specimen Description URINE, CLEAN CATCH   Final   Special Requests NONE   Final   Culture  Setup Time     Final   Value: 06/21/2013 14:43     Performed at [facility]   Colony Count     Final   Value: NO GROWTH     Performed at [facility]   Culture     Final   Value: NO GROWTH     Performed at [facility]   Report Status 06/22/2013 FINAL   Final  MRSA PCR SCREENING     Status: None   Collection Time    06/21/13  4:36 PM      Result Value Ref Range Status   MRSA by PCR NEGATIVE  NEGATIVE Final   Comment:            The GeneXpert MRSA Assay (FDA     approved for NASAL specimens     only), is one component of a     comprehensive MRSA colonization     surveillance program. It is not     intended to diagnose MRSA     infection nor to guide or     monitor treatment for     MRSA infections.  CULTURE, EXPECTORATED SPUTUM-ASSESSMENT     Status: None   Collection Time    06/22/13  6:17 PM      Result Value Ref Range Status   Specimen Description SPUTUM   Final   Special Requests NONE   Final   Sputum evaluation     Final   Value: THIS SPECIMEN IS ACCEPTABLE. RESPIRATORY CULTURE REPORT TO FOLLOW.   Report Status 06/22/2013 FINAL   Final  CULTURE, RESPIRATORY (NON-EXPECTORATED)     Status: None   Collection Time    06/22/13  6:17 PM      Result Value Ref Range Status   Specimen Description SPUTUM  Final   Special Requests NONE   Final   Gram Stain     Final   Value: RARE WBC PRESENT,BOTH PMN AND MONONUCLEAR     RARE SQUAMOUS EPITHELIAL CELLS PRESENT     RARE GRAM POSITIVE COCCI     IN PAIRS     Performed at [facility]   Culture      Final   Value: Culture reincubated for better growth     Performed at [facility]   Report Status PENDING   Incomplete          Studies: Dg Chest Port 1 View  06/23/2013   CLINICAL DATA:  Chest pain, cough and shortness of breath  EXAM: PORTABLE CHEST - 1 VIEW  COMPARISON:  06/21/2013 and prior chest radiographs  FINDINGS: Cardiomegaly is again noted.  Developing right lower lung opacity is suspicious for pneumonia.  Peribronchial/interstitial opacities are again noted.  There is no evidence of pleural effusion, pneumothorax or acute bony abnormality.  IMPRESSION: Developing right lower lung opacity suspicious for pneumonia.   Electronically Signed   By: Laveda Abbe M.D.   On: 06/23/2013 09:09        Scheduled Meds: . FLUoxetine  20 mg Oral Daily  . levofloxacin  750 mg Oral Daily  . metoprolol tartrate  12.5 mg Oral BID  . nicotine  14 mg Transdermal Daily  . pantoprazole  40 mg Oral BID  . sodium chloride  3 mL Intravenous Q12H  . thiamine  100 mg Oral Daily  . traZODone  50 mg Oral QHS   Continuous Infusions: . amiodarone     Followed by  . amiodarone    . diltiazem (CARDIZEM) infusion 10 mg/hr (06/24/13 1131)  . heparin      Principal Problem:   Atrial fibrillation with RVR Active Problems:   Alcoholism /alcohol abuse   Tobacco abuse   Hypertension   MDD (major depressive disorder)   Patient nonadherence   Acute respiratory failure with hypoxia   RUQ abdominal pain   Upper GI bleed    Time spent: 45 minutes    Elease Etienne, MD, FACP, [facility]. [facility] Pager [phone]  If 7PM-7AM, please contact night-coverage [email or web address] Password [email or web address] 06/24/2013, 1:31 PM    LOS: 3 days

## 2013-06-24 NOTE — Care Management Note (Signed)
CARE MANAGEMENT NOTE 06/24/2013  Patient:  [name],[name]   Account Number:  [id number]  Date Initiated:  06/24/2013  Documentation initiated by:  Johny Shock  Subjective/Objective Assessment:   referral for pt who can not afford Eliquis     Action/Plan:   06/24/13 CM following for d/c needs, pt has used hospital resource in Dec 20 14, not eligible for further assistance at this time. Will reseach Eliquis resources from [employer] on Tuesday , Jun 25, 2013 due to holiday today.   Anticipated DC Date:     Anticipated DC Plan:  HOME/SELF CARE         Choice offered to / List presented to:             Status of service:  In process, will continue to follow Medicare Important Message given?   (If response is "NO", the following Medicare IM given date fields will be blank) Date Medicare IM given:   Date Additional Medicare IM given:    Discharge Disposition:    Per UR Regulation:    If discussed at Long Length of Stay Meetings, dates discussed:    Comments:

## 2013-06-24 NOTE — Consult Note (Signed)
Reason for Consult: Recurrent atrial flutter with variable block Referring Physician: [facility]  [name] is an 48 y.o. male.  HPI: Patient is 48 year old male with past medical history significant for hypertension, tobacco abuse, history of focal abuse, depression, was admitted on 522 because of vague abdominal pain associated with nausea vomiting diarrhea and was noted to have acute gastroenteritis and also was noted to be in A. fib flutter with RVR patient subsequently converted back to sinus rhythm and again flipped back into atrial flutter with RVR today, received IV Cardizem IV Lopressor and was started on IV amiodarone with control of heart rate in 80s to 100. Patient states he has not drank alcohol since his discharge from the behavioral health. Patient denies any chest pain. Denies shortness of breath. Denies PND orthopnea leg swelling. Complaints of occasional fluttering. States he was doing fine until he stopped all his medications.   Past Medical History  Diagnosis Date  . Hypertension   . Alcoholism /alcohol abuse   . Anxiety and depression   . Tobacco abuse   . Atrial fibrillation     Past Surgical History  Procedure Laterality Date  . Hernia repair      UHR x2    Family History  Problem Relation Age of Onset  . Heart attack Mother   . Heart attack Father   . Hypertension Mother   . Hypertension Father     Social History:  reports that he has been smoking Cigarettes.  He has a 30 pack-year smoking history. He has never used smokeless tobacco. He reports that he drinks alcohol. He reports that he does not use illicit drugs.  Allergies:  Allergies  Allergen Reactions  . Bee Venom Anaphylaxis    Uses epi pen-- last used 08/23/11    Medications: I have reviewed the patient's current medications.  Results for orders placed during the hospital encounter of 06/21/13 (from the past 48 hour(s))  CULTURE, EXPECTORATED SPUTUM-ASSESSMENT     Status: None   Collection Time    06/22/13  6:17 PM      Result Value Ref Range   Specimen Description SPUTUM     Special Requests NONE     Sputum evaluation       Value: THIS SPECIMEN IS ACCEPTABLE. RESPIRATORY CULTURE REPORT TO FOLLOW.   Report Status 06/22/2013 FINAL    CULTURE, RESPIRATORY (NON-EXPECTORATED)     Status: None   Collection Time    06/22/13  6:17 PM      Result Value Ref Range   Specimen Description SPUTUM     Special Requests NONE     Gram Stain       Value: RARE WBC PRESENT,BOTH PMN AND MONONUCLEAR     RARE SQUAMOUS EPITHELIAL CELLS PRESENT     RARE GRAM POSITIVE COCCI     IN PAIRS     Performed at [facility]   Culture       Value: Culture reincubated for better growth     Performed at [facility]   Report Status PENDING    BASIC METABOLIC PANEL     Status: None   Collection Time    06/24/13  5:44 AM      Result Value Ref Range   Sodium 141  137 - 147 mEq/L   Potassium 4.2  3.7 - 5.3 mEq/L   Chloride 106  96 - 112 mEq/L   CO2 25  19 - 32 mEq/L   Glucose, Bld 81  70 - 99 mg/dL   BUN 6  6 - 23 mg/dL   Creatinine, Ser 0.95  0.50 - 1.35 mg/dL   Calcium 8.6  8.4 - 10.5 mg/dL   GFR calc non Af Amer >90  >90 mL/min   GFR calc Af Amer >90  >90 mL/min   Comment: (NOTE)     The eGFR has been calculated using the CKD EPI equation.     This calculation has not been validated in all clinical situations.     eGFR's persistently <90 mL/min signify possible Chronic Kidney     Disease.  PRO B NATRIURETIC PEPTIDE     Status: Abnormal   Collection Time    06/24/13  5:44 AM      Result Value Ref Range   Pro B Natriuretic peptide (BNP) 935.8 (*) 0 - 125 pg/mL    Dg Chest Port 1 View  06/23/2013   CLINICAL DATA:  Chest pain, cough and shortness of breath  EXAM: PORTABLE CHEST - 1 VIEW  COMPARISON:  06/21/2013 and prior chest radiographs  FINDINGS: Cardiomegaly is again noted.  Developing right lower lung opacity is suspicious for pneumonia.   Peribronchial/interstitial opacities are again noted.  There is no evidence of pleural effusion, pneumothorax or acute bony abnormality.  IMPRESSION: Developing right lower lung opacity suspicious for pneumonia.   Electronically Signed   By: Hassan Rowan M.D.   On: 06/23/2013 09:09    Review of Systems  Constitutional: Negative for fever and chills.  Respiratory: Positive for cough. Negative for hemoptysis, sputum production, shortness of breath and wheezing.   Cardiovascular: Positive for palpitations. Negative for chest pain.  Gastrointestinal: Positive for nausea, vomiting, abdominal pain and diarrhea.  Genitourinary: Negative for dysuria.  Neurological: Negative for dizziness and tingling.   Blood pressure 130/98, pulse 157, temperature 98.8 F (37.1 C), temperature source Oral, resp. rate 20, height _0  (1.676 m), weight 89.313 kg (196 lb 14.4 oz), SpO2 91.00%. Physical Exam  Constitutional: He is oriented to person, place, and time.  HENT:  Head: Normocephalic and atraumatic.  Eyes: Conjunctivae are normal. Pupils are equal, round, and reactive to light. Left eye exhibits no discharge. No scleral icterus.  Neck: Normal range of motion. Neck supple. No JVD present. No tracheal deviation present. No thyromegaly present.  Cardiovascular:  Irregularly irregular S1-S2 normal no S3 gallop  Respiratory:  Decrease vessel at right base with occasional rhonchi  GI: Soft. Bowel sounds are normal. He exhibits no distension. There is no tenderness. There is no rebound.  Musculoskeletal: He exhibits no edema and no tenderness.  Neurological: He is alert and oriented to person, place, and time.    Assessment/Plan: Recurrent atrial flutter with rapid ventricular response Hypertension History of focal abuse History of tobacco abuse Depression Plan Agree with IV amiodarone and heparin for now  We'll start him on also by mouth Lopressor as per orders Been off Cardizem Discuss with patient at  length regarding EP consult and possible flutter ablation patient flatly refused for any invasive procedures and wanted to try medicines only.  [name] 06/24/2013, 2:01 PM

## 2013-06-24 NOTE — Progress Notes (Signed)
Late entry:  Per MD orders, addressed pt HR with PO metoprolol as well as 5mg  IV metoprolol, with no response. MD at bedside requesting cardizem drip. RR RN on floor seeing other pt agreed to initiate and manage drip until new bed placement. Please see RR RN note for follow up. Pt transferred to 3S16 with amiodarone and cardizem drip.

## 2013-06-24 NOTE — Progress Notes (Signed)
    Progress Note   Subjective   No GI complaints. Tolerating diet. Frustrated that his HR is up.   Objective  Vital signs in last 24 hours: Temp:  [98.3 F (36.8 C)-98.8 F (37.1 C)] 98.8 F (37.1 C) (05/25 0844) Pulse Rate:  [64-157] 157 (05/25 1019) Resp:  [17-20] 20 (05/25 0844) BP: (113-135)/(66-98) 130/98 mmHg (05/25 1019) SpO2:  [91 %-96 %] 91 % (05/25 0844) Weight:  [196 lb 14.4 oz (89.313 kg)] 196 lb 14.4 oz (89.313 kg) (05/24 2138) Last BM Date: 06/23/13  General:   Alert, well-developed, white male in NAD Heart:  Regular rate and rhythm; no murmurs Abdomen:  Soft, nontender and nondistended. Normal bowel sounds, without guarding, and without rebound.   Extremities:  Without edema. Neurologic:  Alert and  oriented x4;  grossly normal neurologically. Psych:  Alert and cooperative. Normal mood and affect.  Intake/Output from previous day: 05/24 0701 - 05/25 0700 In: 1080 [P.O.:1080] Out: 1251 [Urine:1250; Stool:1] Intake/Output this shift:    Lab Results:  Recent Labs  06/22/13 0337  WBC 4.5  HGB 12.8*  HCT 38.9*  PLT 151   BMET  Recent Labs  06/22/13 0337 06/24/13 0544  NA 138 141  K 4.3 4.2  CL 111 106  CO2 18* 25  GLUCOSE 87 81  BUN 10 6  CREATININE 0.84 0.95  CALCIUM 7.8* 8.6   LFT  Recent Labs  06/22/13 0337  PROT 5.1*  ALBUMIN 2.5*  AST 36  ALT 99*  ALKPHOS 65  BILITOT 0.7   PT/INR No results found for this basename: LABPROT, INR,  in the last 72 hours Hepatitis Panel  Recent Labs  06/21/13 1253  HEPBSAG NEGATIVE  HCVAB NEGATIVE  HEPAIGM NON REACTIVE  HEPBIGM NON REACTIVE    Studies/Results: Dg Chest Port 1 View  06/23/2013   CLINICAL DATA:  Chest pain, cough and shortness of breath  EXAM: PORTABLE CHEST - 1 VIEW  COMPARISON:  06/21/2013 and prior chest radiographs  FINDINGS: Cardiomegaly is again noted.  Developing right lower lung opacity is suspicious for pneumonia.  Peribronchial/interstitial opacities are again  noted.  There is no evidence of pleural effusion, pneumothorax or acute bony abnormality.  IMPRESSION: Developing right lower lung opacity suspicious for pneumonia.   Electronically Signed   By: Laveda Abbe M.D.   On: 06/23/2013 09:09      Assessment & Plan   1. Presumed gastroenteritis with RUQ, N/V, ? small volume hematemesis. GI symptoms have all resolved. PPI daily for possible GERD, gastritis. If his UGI symptoms are recurrent consider EGD. OK for anticoagulation if needed. GI signing off.   Principal Problem:   Atrial fibrillation with RVR Active Problems:   Alcoholism /alcohol abuse   Tobacco abuse   Hypertension   MDD (major depressive disorder)   Patient nonadherence   Acute respiratory failure with hypoxia   RUQ abdominal pain   Upper GI bleed    LOS: 3 days   Meryl Dare MD 06/24/2013, 10:55 AM

## 2013-06-24 NOTE — Significant Event (Signed)
Rapid Response Event Note  Overview:  Asked to assist with patient on floor needing Cardizem drip on non-cardiac telemetry floor Time Called: 1130 Arrival Time: 1130 Event Type: Cardiac  Initial Focused Assessment:  Dr. Waymon Amato present with patient who has converted to RAF and RVR and MD is requesting Cardizem drip.  Patient on floor where this drip not managed.  Patient alert warm and dry no distress denies CP or SOB - no palpitations felt.  BP 123/93 HR 154.  Patient denies any distress - states he was hoping to go home today.     Interventions:  Placed on 2 liter nasal cannula due to HR.  IV present left hand site unremarkable.  Orders checked and Cardizem drip 1:1 drip hung - 10 mg IV bolus given and drip started at 10 mg/hr at 1139.  1150:  138/87 HR 154 RR 14 O2 sats 98%.  1200:  Remains HR 140"s BP 121/83 - remains asx.  Rebolused with 10 mg Cardizem IV per order.  1215:  148/81  HR 102.  Now with flutter looking appearance - 12 lead EKG done with confirmation aflutter.  12 lead machine reading acute STEMI - compared to previous 12 leads - no changes noted from there.  Dr. Waymon Amato notified.  Will transfer to SDU.  Patient remains asx.  1240:  Dr. Waymon Amato called me with update - he has consulted Dr. Crist Fat. 1245:  113/79 HR 86 - now with consistent 3:1 flutter noted.  Patient continues to deny sx.  1250:  Staying in 2:1 or 4:1 flutter.  Cardizem remains at 10mg /hr.  2nd IV line started per Susie RN right mid-forearm.  1315:  128/87 HR 89.  1320:  Girlfriend in room = patient becoming anxious - HR now 154 with afib RVR again. Patient asx except for anxiety in conversation with girlfriend.  1321:  BP 148/95 Amiodarone 150 mg IV bolus started per order.  1331:  117/71 HR 120  Amiodarone drip at 60 mg/hr.  1330:  Dr. Sharyn Lull present.  Patient continues to be asx.  Dr. Sharyn Lull performed carotid massage.  HR now 95 - aflutter. 1350:  HR 79 with 4:1 and 3:1 flutter - Cardizem decreased to 5 mg/hr.  BP  120/77.  Patient transferred to 3S16 via bed with cardiac monitor, oxygen and medicines infusing.  Handoff to [facility].     Event Summary: Name of Physician Notified: Dr. York Cerise at  (present in patients room)    at    Outcome: Transferred (Comment) (SDU for cardizem drip)     [place]

## 2013-06-24 NOTE — Progress Notes (Addendum)
ANTICOAGULATION CONSULT NOTE - Initial Consult  Pharmacy Consult for Heparin Indication: atrial fibrillation  Allergies  Allergen Reactions  . Bee Venom Anaphylaxis    Uses epi pen-- last used 08/23/11    Patient Measurements: Height: 5\' 6"  (167.6 cm) Weight: 196 lb 14.4 oz (89.313 kg) IBW/kg (Calculated) : 63.8 Heparin Dosing Weight: 82.6kg  Vital Signs: Temp: 98.8 F (37.1 C) (05/25 0844) Temp src: Oral (05/25 0844) BP: 130/98 mmHg (05/25 1019) Pulse Rate: 157 (05/25 1019)  Labs:  Recent Labs  06/22/13 0337 06/24/13 0544  HGB 12.8*  --   HCT 38.9*  --   PLT 151  --   CREATININE 0.84 0.95    Estimated Creatinine Clearance: 99.5 ml/min (by C-G formula based on Cr of 0.95).   Medical History: Past Medical History  Diagnosis Date  . Hypertension   . Alcoholism /alcohol abuse   . Anxiety and depression   . Tobacco abuse   . Atrial fibrillation    Assessment: 48 yo M admitted w/afib with RVR, tenderness in right upper quadrant, and possible sepsis (due to possible cholecystitis). Pharmacy has been consulted to dose IV heparin for afib. Hg 12.8, plt 151. Some reports of recent hematemesis.   Goal of Therapy:  Heparin level 0.3-0.7 units/ml Monitor platelets by anticoagulation protocol: Yes   Plan:  - Start IV heparin 1200units/hr (no bolus due to recent hematemesis) - Obtain heparin level at 1930 - Monitor daily CBC, heparin level, and s/s of bleeding  Christiane Ha A. Lenon Ahmadi, PharmD Clinical Pharmacist - Resident Pager: [phone] Pharmacy: [phone] 06/24/2013 1:09 PM   Addendum: Heparin level low, increase rate to 1350 units/hr Next level with am labs Monitor s/sx bleeding - recent hematemesis    Agapito Games, PharmD, BCPS Clinical Pharmacist Pager: [phone] 06/24/2013 9:37 PM

## 2013-06-24 NOTE — Progress Notes (Signed)
Pt HR changed to afib with HR in 120s-140s. MD notified, orders to be placed. Pt denies complaints, will continue to monitor.

## 2013-06-25 LAB — CULTURE, RESPIRATORY

## 2013-06-25 LAB — CULTURE, RESPIRATORY W GRAM STAIN: Culture: NORMAL

## 2013-06-25 LAB — HEPARIN LEVEL (UNFRACTIONATED): Heparin Unfractionated: 0.62 IU/mL (ref 0.30–0.70)

## 2013-06-25 MED ORDER — AMIODARONE HCL 200 MG PO TABS
400.0000 mg | ORAL_TABLET | Freq: Two times a day (BID) | ORAL | Status: DC
  Administered 2013-06-25 – 2013-06-26 (×3): 400 mg via ORAL
  Filled 2013-06-25 (×4): qty 2

## 2013-06-25 MED ORDER — HEPARIN (PORCINE) IN NACL 100-0.45 UNIT/ML-% IJ SOLN
1300.0000 [IU]/h | INTRAMUSCULAR | Status: DC
  Administered 2013-06-26: 1300 [IU]/h via INTRAVENOUS
  Filled 2013-06-25: qty 250

## 2013-06-25 NOTE — Progress Notes (Signed)
Subjective:  Patient denies any chest pain or shortness of breath. Denies any palpitations remains in A. fib flutter with controlled ventricular response . Discussed with patient regarding TEE assisted cardioversion patient refused and wanted to be treated medically only. Patient eager  to go home  Objective:  Vital Signs in the last 24 hours: Temp:  [97.8 F (36.6 C)-99.6 F (37.6 C)] 97.8 F (36.6 C) (05/26 0700) Pulse Rate:  [62-157] 90 (05/26 0700) Resp:  [13-27] 17 (05/26 0700) BP: (92-132)/(60-98) 124/87 mmHg (05/26 0700) SpO2:  [90 %-96 %] 92 % (05/26 0700) Weight:  [89.3 kg (196 lb 13.9 oz)] 89.3 kg (196 lb 13.9 oz) (05/26 0300)  Intake/Output from previous day: 05/25 0701 - 05/26 0700 In: 510.9 [I.V.:510.9] Out: 2875 [Urine:2875] Intake/Output from this shift: Total I/O In: -  Out: 400 [Urine:400]  Physical Exam: Neck: no adenopathy, no carotid bruit, no JVD and supple, symmetrical, trachea midline Lungs: Decreased breath sound at bases with occasional right lung rhonchi Heart: irregularly irregular rhythm, S1, S2 normal and Soft systolic murmur noted Abdomen: soft, non-tender; bowel sounds normal; no masses,  no organomegaly Extremities: extremities normal, atraumatic, no cyanosis or edema  Lab Results: No results found for this basename: WBC, HGB, PLT,  in the last 72 hours  Recent Labs  06/24/13 0544  NA 141  K 4.2  CL 106  CO2 25  GLUCOSE 81  BUN 6  CREATININE 0.95   No results found for this basename: TROPONINI, CK, MB,  in the last 72 hours Hepatic Function Panel No results found for this basename: PROT, ALBUMIN, AST, ALT, ALKPHOS, BILITOT, BILIDIR, IBILI,  in the last 72 hours No results found for this basename: CHOL,  in the last 72 hours No results found for this basename: PROTIME,  in the last 72 hours  Imaging: Imaging results have been reviewed and No results found.  Cardiac Studies:  Assessment/Plan:  Recurrent atrial fib/ flutter with  controlled ventricular response  Hypertension  History of alcohol abuse  History of tobacco abuse  Depression Possible right lung pneumonia Plan Change IV amiodarone to by mouth as per orders   LOS: 4 days    Robynn Pane 06/25/2013, 8:48 AM

## 2013-06-25 NOTE — Progress Notes (Signed)
Patient ID: [name], male   DOB: 04/12/1965, 48 y.o.   MRN: [id number] [facility] PROGRESS NOTE  [name] NFA:[id number] DOB: July 05, 1965 DOA: 06/21/2013 PCP: No PCP Per Patient  Brief narrative: 48 year old male with history of A. fib, hypertension, alcohol abuse, tobacco abuse, anxiety & major depression, recently hospitalized for A. fib with RVR, found to have major depression & suicidal ideations and discharged to behavioral health center (where he was hospitalized for alcohol detox). Pt has extensive reported history of non-compliance with medications. He was admitted to [facility] 06/21/13 with several episodes of hemoptysis, diarrhea, productive cough, fever and chills. In addition, he had complaints of RUQ abdominal pain. His hospital course was complicated due to A fib with RVR requiring stepdown unit monitoring and treatment with IV amiodarone, IV cardizem and IV heparin. GI was consulted due to concern for cholecystitis. His abdominal pain has eventually spontaneously resolved.   Assessment/Plan:   Principal Problem: PAF with RVR  Secondary to non compliance, alcohol abuse versus hypoxia. Initially, required Cardizem drip but has reverted to NSR and was transferred to tele floor. He was then on metoprolol PO but on 5/25 went back in to A fib again requiring transfer back to SDU.  He was seen by Dr. Sharyn Lull of cardiology who recommended changing amiodarone to PO. As far as anticoagulation is concerned he will likely be discharged with eliquis although no official recommendation yet per cardio. Pt is on IV heparin for now. Current rate control meds are metoprolol 25 mg PO BID and amiodarone 400 mg PO BID Active Problems: RUQ abdominal pain and mildly abnormal LFTs  Abdominal pain improved. Nausea improved.   Blood cultures to date are negative  HIDA scan is negative; low suspicion for cholecystitis since no fever or leukocytosis and pain resolved. Surgery was consulted bu they  signed off with no other recommendations other than supportive care. GI has also seen and evaluated the pt and they too signed off.  LFT's likely elevated due to alcohol related hepatitis. Upper GI bleed  Seems to have resolved.   Hemoglobin remains stable   Continue protonix 40 mg PO BID Tobacco abuse  Cessation counseled. Continue nicotine patch History of alcohol abuse  Reports not drinking since his discharge from [facility] Dehydration  Resolved  Hypertension  Continue metoprolol 25 mg PO BID History of major depression & anxiety  Continue Prozac 20 mg daily and trazodone 50 mg each bedtime for insomnia. Acute respiratory failure/possible HCAP/?COPD  Chest x-ray from 5/24 suggests new right lower lung opacity concerning for pneumonia. Started on Levaquin   Code Status: Full  Family Communication: Discussed with patient. No family at bedside.  Disposition Plan: Transfer to step down unit. Not medically stable for discharge.   Consultants:  General surgery- signed off  Gastroenterology-signed off  Cardiology Procedures:  None Antibiotics:  IV vancomycin and Zosyn 5/21 > 5/23  Oral levofloxacin 5/24 >  Alison Murray, MD  [facility] Pager [phone]  If 7PM-7AM, please contact night-coverage [email or web address] Password [email or web address] 06/25/2013, 11:08 AM   LOS: 4 days    HPI/Subjective: Says he feels better this am.   Objective: Filed Vitals:   06/25/13 0500 06/25/13 0600 06/25/13 0700 06/25/13 0800  BP: 110/84 132/89 124/87   Pulse: 107 62 90   Temp:   97.8 F (36.6 C)   TempSrc:   Oral   Resp: 19 17 17    Height:      Weight:      SpO2: 95% 93% 92%  98%    Intake/Output Summary (Last 24 hours) at 06/25/13 1108 Last data filed at 06/25/13 0800  Gross per 24 hour  Intake  541.1 ml  Output   2450 ml  Net -1908.9 ml    Exam:   General:  Pt is alert, follows commands appropriately, not in acute distress  Cardiovascular: irregular rhythm, rate  controlled, S1/S2 appreciated, soft systolic ejection murmur apprecaited  Respiratory: Coarse breath sounds, no wheezing   Abdomen: Soft, non tender, non distended, bowel sounds present, no guarding  Extremities: No edema, pulses DP and PT palpable bilaterally  Neuro: Grossly nonfocal  Data Reviewed: Basic Metabolic Panel:  Recent Labs Lab 06/21/13 0045 06/21/13 0056 06/22/13 0337 06/24/13 0544  NA 139 141 138 141  K 3.6* 3.5* 4.3 4.2  CL 107 108 111 106  CO2 18*  --  18* 25  GLUCOSE 86 83 87 81  BUN 17 15 10 6   CREATININE 0.77 0.90 0.84 0.95  CALCIUM 8.1*  --  7.8* 8.6   Liver Function Tests:  Recent Labs Lab 06/21/13 0045 06/22/13 0337  AST 71* 36  ALT 148* 99*  ALKPHOS 80 65  BILITOT 0.3 0.7  PROT 6.1 5.1*  ALBUMIN 3.2* 2.5*    Recent Labs Lab 06/21/13 0045  LIPASE 20   No results found for this basename: AMMONIA,  in the last 168 hours CBC:  Recent Labs Lab 06/21/13 0045 06/21/13 0056 06/22/13 0337  WBC 6.3  --  4.5  NEUTROABS 3.3  --   --   HGB 12.8* 13.6 12.8*  HCT 36.8* 40.0 38.9*  MCV 94.8  --  97.0  PLT 186  --  151   Cardiac Enzymes: No results found for this basename: CKTOTAL, CKMB, CKMBINDEX, TROPONINI,  in the last 168 hours BNP: No components found with this basename: POCBNP,  CBG:  Recent Labs Lab 06/22/13 0024  GLUCAP 123*    CULTURE, BLOOD (ROUTINE X 2)     Status: None   Collection Time    06/21/13  7:55 AM      Result Value Ref Range Status   Specimen Description BLOOD RIGHT ARM   Final   Value:        BLOOD CULTURE RECEIVED NO GROWTH TO DATE CULTURE WILL BE HELD FOR 5 DAYS BEFORE ISSUING A FINAL NEGATIVE REPORT     Performed at [facility]   Report Status PENDING   Incomplete  CULTURE, BLOOD (ROUTINE X 2)     Status: None   Collection Time    06/21/13  8:10 AM      Result Value Ref Range Status   Specimen Description BLOOD LEFT ARM   Final   Value:        BLOOD CULTURE RECEIVED NO GROWTH TO DATE  CULTURE WILL BE HELD FOR 5 DAYS BEFORE ISSUING A FINAL NEGATIVE REPORT     Performed at [facility]   Report Status PENDING   Incomplete  URINE CULTURE     Status: None   Collection Time    06/21/13  8:26 AM      Result Value Ref Range Status   Specimen Description URINE, CLEAN CATCH   Final   Value: NO GROWTH     Performed at [facility]   Report Status 06/22/2013 FINAL   Final  MRSA PCR SCREENING     Status: None   Collection Time    06/21/13  4:36 PM      Result  Value Ref Range Status   MRSA by PCR NEGATIVE  NEGATIVE Final  CULTURE, EXPECTORATED SPUTUM-ASSESSMENT     Status: None   Collection Time    06/22/13  6:17 PM      Result Value Ref Range Status   Specimen Description SPUTUM   Final   Special Requests NONE   Final   Sputum evaluation     Final   Value: THIS SPECIMEN IS ACCEPTABLE. RESPIRATORY CULTURE REPORT TO FOLLOW.   Report Status 06/22/2013 FINAL   Final  CULTURE, RESPIRATORY (NON-EXPECTORATED)     Status: None   Collection Time    06/22/13  6:17 PM      Result Value Ref Range Status   Specimen Description SPUTUM   Final   Value: NORMAL OROPHARYNGEAL FLORA     Performed at [facility]   Report Status 06/25/2013 FINAL   Final  MRSA PCR SCREENING     Status: None   Collection Time    06/24/13  2:05 PM      Result Value Ref Range Status   MRSA by PCR NEGATIVE  NEGATIVE Final     Studies: No results found.  Scheduled Meds: . amiodarone  400 mg Oral BID  . FLUoxetine  20 mg Oral Daily  . levofloxacin  750 mg Oral Daily  . metoprolol tartrate  25 mg Oral BID  . nicotine  14 mg Transdermal Daily  . pantoprazole  40 mg Oral BID  . thiamine  100 mg Oral Daily  . traZODone  50 mg Oral QHS   Continuous Infusions: . heparin 1,350 Units/hr (06/25/13 0800)

## 2013-06-25 NOTE — Progress Notes (Signed)
UR completed. Giordan Fordham RN CCM Case Mgmt phone [phone] 

## 2013-06-25 NOTE — Progress Notes (Signed)
ANTICOAGULATION CONSULT NOTE - FOLLOW UP  Pharmacy Consult:  Heparin Indication: atrial fibrillation  Allergies  Allergen Reactions  . Bee Venom Anaphylaxis    Uses epi pen-- last used 08/23/11    Patient Measurements: Height: 5\' 6"  (167.6 cm) Weight: 196 lb 13.9 oz (89.3 kg) IBW/kg (Calculated) : 63.8 Heparin Dosing Weight: 83 kg  Vital Signs: Temp: 98.3 F (36.8 C) (05/26 1142) Temp src: Oral (05/26 1142) BP: 129/76 mmHg (05/26 1300) Pulse Rate: 55 (05/26 1300)  Labs:  Recent Labs  06/24/13 0544 06/24/13 1930 06/25/13 0315  HEPARINUNFRC  --  0.24* 0.62  CREATININE 0.95  --   --     Estimated Creatinine Clearance: 99.5 ml/min (by C-G formula based on Cr of 0.95).     Assessment: 24 YOM admitted with Afib with RVR to continue on IV heparin while Eliquis is on hold.  Heparin level therapeutic, but is toward the high end of goal.  No bleeding per RN.   Goal of Therapy:  Heparin level 0.3-0.7 units/ml Monitor platelets by anticoagulation protocol: Yes    Plan:  - Decrease heparin gtt to 1300 units/hr - Daily HL / CBC    Exander Shaul D. Laney Potash, PharmD, BCPS Pager:  [phone] 06/25/2013, 2:50 PM

## 2013-06-25 NOTE — Progress Notes (Signed)
CARE MANAGEMENT NOTE 06/25/2013  Patient:  [name],[name]   Account Number:  [id number]  Date Initiated:  06/24/2013  Documentation initiated by:  Johny Shock  Subjective/Objective Assessment:   referral for pt who can not afford Eliquis     Action/Plan:   06/24/13 CM following for d/c needs, pt has used hospital resource in Dec 20 14, not eligible for further assistance at this time. Will reseach Eliquis resources from [employer] on Tuesday , Jun 25, 2013 due to holiday today.   Anticipated DC Date:     Anticipated DC Plan:  HOME/SELF CARE      DC Planning Services  CM consult      Choice offered to / List presented to:             Status of service:  In process, will continue to follow Medicare Important Message given?   (If response is "NO", the following Medicare IM given date fields will be blank) Date Medicare IM given:   Date Additional Medicare IM given:    Discharge Disposition:    Per UR Regulation:    If discussed at Long Length of Stay Meetings, dates discussed:    Comments:  06/25/2013 1600 NCM spoke to pt and states he goes to [facility] for his psych meds. Explained to pt he will need appt with PCP. Discussed [facility]. Explained he can get his meds at a discount rate at clinic if he has appt. Attempted to arrange appt with [facility] and unable to arrange. Left message with clinic. Waiting return call. Pt can have appt with Dr. Marthann Schiller who will accept pt who can eligible for [facility]. Will follow up with Dr. Ashley Royalty office for appt, [phone]. Provided pt with Eliquis free 30 day trial card. Clinic will assist pt with getting additional med. Provided pt with number to contact [facility]. Isidoro Donning RN CCM Case Mgmt phone [phone]

## 2013-06-26 LAB — HEPARIN LEVEL (UNFRACTIONATED)
HEPARIN UNFRACTIONATED: 0.7 [IU]/mL (ref 0.30–0.70)
HEPARIN UNFRACTIONATED: 0.42 [IU]/mL (ref 0.30–0.70)

## 2013-06-26 LAB — HELICOBACTER PYLORI ABS-IGG+IGA, BLD: H PYLORI IGA: 5.5 U/mL (ref <9.0)

## 2013-06-26 MED ORDER — METOPROLOL TARTRATE 25 MG PO TABS: 25.0000 mg | ORAL_TABLET | Freq: Three times a day (TID) | ORAL | Status: DC

## 2013-06-26 MED ORDER — ALPRAZOLAM 0.5 MG PO TABS
0.5000 mg | ORAL_TABLET | Freq: Two times a day (BID) | ORAL | Status: DC | PRN
  Administered 2013-06-26: 0.5 mg via ORAL
  Filled 2013-06-26: qty 1

## 2013-06-26 MED ORDER — HEPARIN (PORCINE) IN NACL 100-0.45 UNIT/ML-% IJ SOLN
1150.0000 [IU]/h | INTRAMUSCULAR | Status: DC
  Filled 2013-06-26 (×2): qty 250

## 2013-06-26 MED ORDER — AMIODARONE HCL 200 MG PO TABS
200.0000 mg | ORAL_TABLET | Freq: Every day | ORAL | Status: DC
  Administered 2013-06-27: 200 mg via ORAL
  Filled 2013-06-26: qty 1

## 2013-06-26 MED ORDER — METOPROLOL TARTRATE 25 MG PO TABS
25.0000 mg | ORAL_TABLET | Freq: Three times a day (TID) | ORAL | Status: DC
  Administered 2013-06-26 – 2013-06-27 (×2): 25 mg via ORAL
  Filled 2013-06-26 (×5): qty 1

## 2013-06-26 MED ORDER — AMIODARONE HCL 400 MG PO TABS: 400.0000 mg | ORAL_TABLET | Freq: Two times a day (BID) | ORAL | Status: DC

## 2013-06-26 MED ORDER — LEVOFLOXACIN 750 MG PO TABS: 750.0000 mg | ORAL_TABLET | Freq: Every day | ORAL | Status: DC

## 2013-06-26 NOTE — Discharge Summary (Addendum)
Physician Discharge Summary  [name] ZOX:[id number] DOB: 1965-05-22 DOA: 06/21/2013  PCP: No PCP Per Patient  Admit date: 06/21/2013 Discharge date: 06/27/2013  Time spent: 35 minutes  Recommendations for Outpatient Follow-up:  Follow up with PCP in 1-2 weeks Follow up with Dr. Sharyn Lull in 1 week  Discharge Diagnoses:  Principal Problem:   Atrial fibrillation with RVR Active Problems:   Alcoholism /alcohol abuse   Tobacco abuse   Hypertension   MDD (major depressive disorder)   Patient nonadherence   Acute respiratory failure with hypoxia   RUQ abdominal pain   Upper GI bleed   Discharge Condition: Improved  Diet recommendation: Heart Healthy  Filed Weights   06/25/13 0300 06/26/13 0453 06/27/13 0500  Weight: 89.3 kg (196 lb 13.9 oz) 80.6 kg (177 lb 11.1 oz) 78.2 kg (172 lb 6.4 oz)    History of present illness:  [name] is a 48 y.o. male with Past medical history of hypertension, A. fib, alcohol abuse, tobacco abuse, major depression.  Patient was recently hospitalized for A. fib with RVR and was found to have major depression and was discharged to behavioral health. From behavioral health he was recently discharged after alcohol detox. Patient was recommended to continue taking his medications.  Patient mentions that after going from the hospital he has not taken any of his medications.  Patient presented today with the complaints of 8-9 episodes of vomiting with streaking of bright red blood, diarrhea for 4-5 times in the last 1 day. He also complains of acid reflux. He complains of some shortness of breath and cough that is present since last one week. He mentions he brings up green yellow sputum. He denies any blood in the sputum. He complains of fever and chills. He complains of dizziness without any positive episodes.  He denied any abdominal pain but has significant pain on examination which he mentions has been present since last one day. He denies any prior  abdominal tenderness associated with food.  He mentions he has not drank any alcohol after his recent discharge from the behavioral health.  He denies any rash anywhere denies any blood in the urine blood in the bowels. He mentions about burning urination and bilateral leg swelling. He also complains of difficulty with breathing when lying down.  The patient is coming from home. And at his baseline independent for most of his ADL.  Hospital Course:  PAF with RVR  Secondary to non compliance, alcohol abuse versus hypoxia. Initially, required Cardizem drip but has reverted to NSR and was transferred to tele floor. He was then on metoprolol PO but on 5/25 went back in to A fib again requiring transfer back to SDU.  He was seen by Dr. Sharyn Lull of cardiology who recommended changing amiodarone to PO. As far as anticoagulation is concerned he will likely be discharged with eliquis although no official recommendation yet per cardio. Pt is on IV heparin for now. Was on metoprolol 25 mg PO BID and amiodarone 400 mg PO BID  Suspected vagal episode yesterday with sinus brady on EKG. D/w Cardiology with recs to decrease amiodarone to 200mg  daily.  Pt remains sinus bradycardic overnight Per Cardiology, pt is to follow up as an outpatient in one week RUQ abdominal pain and mildly abnormal LFTs  Abdominal pain improved. Nausea improved.  Blood cultures to date are negative  HIDA scan is negative; low suspicion for cholecystitis since no fever or leukocytosis and pain resolved. Surgery was consulted bu they signed off  with no other recommendations other than supportive care. GI has also seen and evaluated the pt and they too signed off.  LFT's likely elevated due to alcohol related hepatitis. Upper GI bleed  Seems to have resolved.  Hemoglobin remains stable  Continue protonix 40 mg PO BID Tobacco abuse  Cessation counseled. Continue nicotine patch History of alcohol abuse  Reports not drinking since his  discharge from [facility] Dehydration  Resolved  Hypertension  Continued metoprolol 25 mg PO BID History of major depression & anxiety  Continue Prozac 20 mg daily and trazodone 50 mg each bedtime for insomnia  PRN xanax ordered. Acute respiratory failure/possible HCAP/?COPD  Chest x-ray from 5/24 suggests new right lower lung opacity concerning for pneumonia. Started on Levaquin  Consultations: General surgery Gastroenterology Cardiology - Dr. Sharyn Lull  Discharge Exam: Filed Vitals:   06/27/13 0042 06/27/13 0429 06/27/13 0500 06/27/13 0752  BP: 109/63 123/69  135/86  Pulse: 52 52  53  Temp: 97.6 F (36.4 C) 97.8 F (36.6 C)  97.6 F (36.4 C)  TempSrc: Oral Oral  Oral  Resp: 17 19  13   Height:      Weight:   78.2 kg (172 lb 6.4 oz)   SpO2: 95% 94%  92%    General: awake, in nad Cardiovascular: irregularly irregular, s1, s2 Respiratory: normal resp effort, no wheezing  Discharge Instructions     Medication List    STOP taking these medications       traZODone 50 MG tablet  Commonly known as:  DESYREL      TAKE these medications       amiodarone 200 MG tablet  Commonly known as:  PACERONE  Take 1 tablet (200 mg total) by mouth daily.     apixaban 5 MG Tabs tablet  Commonly known as:  ELIQUIS  Take 1 tablet (5 mg total) by mouth 2 (two) times daily.     aspirin 325 MG tablet  Take 650 mg by mouth every 6 (six) hours as needed (pain).     FLUoxetine 20 MG capsule  Commonly known as:  PROZAC  Take 1 capsule (20 mg total) by mouth daily.     levofloxacin 750 MG tablet  Commonly known as:  LEVAQUIN  Take 1 tablet (750 mg total) by mouth daily.     metoprolol tartrate 25 MG tablet  Commonly known as:  LOPRESSOR  Take 1 tablet (25 mg total) by mouth 3 (three) times daily.       Allergies  Allergen Reactions  . Bee Venom Anaphylaxis    Uses epi pen-- last used 08/23/11   Follow-up Information   Follow up with  [facility]      On 07/08/2013. (@ 1130 am with Advani for primary care follow up- [facility] card application will be given to pt at time of visit. )    Contact information:   [address] [place] [address] [phone]      Follow up with Robynn Pane, MD. Schedule an appointment as soon as possible for a visit in 1 week.   Specialty:  Cardiology   Contact information:   [address] [place] [address] [phone]        The results of significant diagnostics from this hospitalization (including imaging, microbiology, ancillary and laboratory) are listed below for reference.    Significant Diagnostic Studies: Nm Hepatobiliary Liver Func  06/21/2013   CLINICAL DATA:  Abdominal pain, nausea, vomiting, question  acalculus cholecystitis  EXAM: NUCLEAR MEDICINE HEPATOBILIARY IMAGING  TECHNIQUE: Sequential images of the abdomen were obtained out to 60 minutes following intravenous administration of radiopharmaceutical.  RADIOPHARMACEUTICALS:  5 mCi Tc-68m Choletec IV  COMPARISON:  Ultrasound abdomen limited RIGHT upper quadrant 06/21/2013  FINDINGS: Normal tracer extraction from bloodstream indicating normal hepatocellular function.  Few scattered artifacts from patient movement and retained tracer within IV tubing.  Prompt excretion of tracer into biliary tree.  Gallbladder visualized at 15 min.  Small bowel visualized at 22 min.  No evidence of cystic duct or CBD obstruction.  IMPRESSION: Patent biliary system.  No scintigraphic evidence acute cholecystitis.   Electronically Signed   By: Ulyses Southward M.D.   On: 06/21/2013 13:24   Dg Chest Port 1 View  06/23/2013   CLINICAL DATA:  Chest pain, cough and shortness of breath  EXAM: PORTABLE CHEST - 1 VIEW  COMPARISON:  06/21/2013 and prior chest radiographs  FINDINGS: Cardiomegaly is again noted.  Developing right lower lung opacity is suspicious for pneumonia.  Peribronchial/interstitial opacities are again noted.  There is no  evidence of pleural effusion, pneumothorax or acute bony abnormality.  IMPRESSION: Developing right lower lung opacity suspicious for pneumonia.   Electronically Signed   By: Laveda Abbe M.D.   On: 06/23/2013 09:09   Dg Chest Portable 1 View  06/21/2013   CLINICAL DATA:  ABDOMINAL PAIN  EXAM: PORTABLE CHEST - 1 VIEW  COMPARISON:  DG CHEST 1V PORT dated 05/05/2013  FINDINGS: Cardiac silhouette is enlarged. There is mild prominence of the interstitial markings and mild peribronchial cuffing. No focal region of consolidation or focal infiltrates. No acute osseous abnormalities.  IMPRESSION: Mild pulmonary edema.   Electronically Signed   By: Salome Holmes M.D.   On: 06/21/2013 01:17   US Abdomen Limited Ruq  06/21/2013   CLINICAL DATA:  Right upper quadrant abdominal pain.  EXAM: US ABDOMEN LIMITED - RIGHT UPPER QUADRANT  COMPARISON:  No priors.  FINDINGS: Gallbladder:  Echogenic material layering dependently in the gallbladder, compatible with biliary sludge. No definite gallstones. Gallbladder wall appears thickened measuring 6 mm. Gallbladder is only mildly distended. Trace amount of pericholecystic fluid. Per report from the sonographer, the patient did exhibit a sonographic Murphy's sign on examination.  Common bile duct:  Diameter: Normal in caliber measuring 2.7 mm in the porta hepatis.  Liver:  No focal lesion identified. Within normal limits in parenchymal echogenicity.  Trace right pleural effusion.  IMPRESSION: 1. Biliary sludge in the gallbladder, which demonstrates a thickened gallbladder wall with trace amount of pericholecystic fluid and positive Murphy's sign on examination. Findings are suspicious for acute cholecystitis, however, the gallbladder is not significantly distended at this time. Clinical correlation is recommended. 2. Small right pleural effusion.   Electronically Signed   By: Trudie Reed M.D.   On: 06/21/2013 04:37    Microbiology: Recent Results (from the past 240 hour(s))   CULTURE, BLOOD (ROUTINE X 2)     Status: None   Collection Time    06/21/13  7:55 AM      Result Value Ref Range Status   Specimen Description BLOOD RIGHT ARM   Final   Special Requests BOTTLES DRAWN AEROBIC ONLY 10CC   Final   Culture  Setup Time     Final   Value: 06/21/2013 14:14     Performed at [facility]   Culture     Final   Value: NO GROWTH 5 DAYS  Performed at [facility]   Report Status 06/27/2013 FINAL   Final  CULTURE, BLOOD (ROUTINE X 2)     Status: None   Collection Time    06/21/13  8:10 AM      Result Value Ref Range Status   Specimen Description BLOOD LEFT ARM   Final   Special Requests BOTTLES DRAWN AEROBIC ONLY [facility]   Final   Culture  Setup Time     Final   Value: 06/21/2013 14:14     Performed at [facility]   Culture     Final   Value: NO GROWTH 5 DAYS     Performed at [facility]   Report Status 06/27/2013 FINAL   Final  URINE CULTURE     Status: None   Collection Time    06/21/13  8:26 AM      Result Value Ref Range Status   Specimen Description URINE, CLEAN CATCH   Final   Special Requests NONE   Final   Culture  Setup Time     Final   Value: 06/21/2013 14:43     Performed at [facility] Count     Final   Value: NO GROWTH     Performed at [facility]   Culture     Final   Value: NO GROWTH     Performed at [facility]   Report Status 06/22/2013 FINAL   Final  MRSA PCR SCREENING     Status: None   Collection Time    06/21/13  4:36 PM      Result Value Ref Range Status   MRSA by PCR NEGATIVE  NEGATIVE Final   Comment:            The GeneXpert MRSA Assay (FDA     approved for NASAL specimens     only), is one component of a     comprehensive MRSA colonization     surveillance program. It is not     intended to diagnose MRSA     infection nor to guide or     monitor treatment for     MRSA infections.  CULTURE, EXPECTORATED SPUTUM-ASSESSMENT     Status: None    Collection Time    06/22/13  6:17 PM      Result Value Ref Range Status   Specimen Description SPUTUM   Final   Special Requests NONE   Final   Sputum evaluation     Final   Value: THIS SPECIMEN IS ACCEPTABLE. RESPIRATORY CULTURE REPORT TO FOLLOW.   Report Status 06/22/2013 FINAL   Final  CULTURE, RESPIRATORY (NON-EXPECTORATED)     Status: None   Collection Time    06/22/13  6:17 PM      Result Value Ref Range Status   Specimen Description SPUTUM   Final   Special Requests NONE   Final   Gram Stain     Final   Value: RARE WBC PRESENT,BOTH PMN AND MONONUCLEAR     RARE SQUAMOUS EPITHELIAL CELLS PRESENT     RARE GRAM POSITIVE COCCI     IN PAIRS     Performed at [facility]   Culture     Final   Value: NORMAL OROPHARYNGEAL FLORA     Performed at [facility]   Report Status 06/25/2013 FINAL   Final  MRSA PCR SCREENING     Status: None   Collection Time  06/24/13  2:05 PM      Result Value Ref Range Status   MRSA by PCR NEGATIVE  NEGATIVE Final   Comment:            The GeneXpert MRSA Assay (FDA     approved for NASAL specimens     only), is one component of a     comprehensive MRSA colonization     surveillance program. It is not     intended to diagnose MRSA     infection nor to guide or     monitor treatment for     MRSA infections.     Labs: Basic Metabolic Panel:  Recent Labs Lab 06/21/13 0045 06/21/13 0056 06/22/13 0337 06/24/13 0544 06/27/13 0410  NA 139 141 138 141 140  K 3.6* 3.5* 4.3 4.2 3.7  CL 107 108 111 106 106  CO2 18*  --  18* 25 23  GLUCOSE 86 83 87 81 87  BUN 17 15 10 6 8   CREATININE 0.77 0.90 0.84 0.95 0.97  CALCIUM 8.1*  --  7.8* 8.6 8.8   Liver Function Tests:  Recent Labs Lab 06/21/13 0045 06/22/13 0337  AST 71* 36  ALT 148* 99*  ALKPHOS 80 65  BILITOT 0.3 0.7  PROT 6.1 5.1*  ALBUMIN 3.2* 2.5*    Recent Labs Lab 06/21/13 0045  LIPASE 20   No results found for this basename: AMMONIA,  in the last 168  hours CBC:  Recent Labs Lab 06/21/13 0045 06/21/13 0056 06/22/13 0337 06/27/13 0410  WBC 6.3  --  4.5 5.3  NEUTROABS 3.3  --   --   --   HGB 12.8* 13.6 12.8* 14.0  HCT 36.8* 40.0 38.9* 41.3  MCV 94.8  --  97.0 94.5  PLT 186  --  151 157   Cardiac Enzymes: No results found for this basename: CKTOTAL, CKMB, CKMBINDEX, TROPONINI,  in the last 168 hours BNP: BNP (last 3 results)  Recent Labs  06/24/13 0544  PROBNP 935.8*   CBG:  Recent Labs Lab 06/22/13 0024  GLUCAP 123*    Signed:  Jerald KiefStephen K Chiu  [facility] 06/27/2013, 11:36 AM

## 2013-06-26 NOTE — Care Management Note (Addendum)
    Page 1 of 2   06/27/2013     12:17:31 PM CARE MANAGEMENT NOTE 06/27/2013  Patient:  Robert, [name]   Account Number:  [id number]  Date Initiated:  06/24/2013  Documentation initiated by:  [name]  Subjective/Objective Assessment:   referral for pt who can not afford Eliquis     Action/Plan:   06/24/13 CM following for d/c needs, pt has used hospital resource in Dec 20 14, not eligible for further assistance at this time. Will reseach Eliquis resources from [employer] on Tuesday , Jun 25, 2013 due to holiday today.   Anticipated DC Date:     Anticipated DC Plan:  HOME/SELF CARE      DC Planning Services  CM consult      Choice offered to / List presented to:             Status of service:  In process, will continue to follow Medicare Important Message given?  NO (If response is "NO", the following Medicare IM given date fields will be blank) Date Medicare IM given:   Date Additional Medicare IM given:    Discharge Disposition:  HOME/SELF CARE  Per UR Regulation:  Reviewed for med. necessity/level of care/duration of stay  If discussed at Long Length of Stay Meetings, dates discussed:    Comments:   06-27-13 1213 Robert Bamberger, RN,BSN [phone] CM did call to the Outpatient Pharmacy at the [facility]- pt has a f/u appointment. CM spoke to [facility] in Pharmacy and pt will be able to come to Pharmacy today at d/c and get d/c meds. [name] stated one fee can be waived. Pt is not aware. 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 CM did fax Rx's over to Pharmacy. Pt has 30 day free card for eliwuis that he will take to Pharmacy. No further needs from CM at this time. CM also provided pt a bus pass. Charge Nurse to escort pt over to the [facility] and [facility].      06-26-13 [address] [place] [phone] CM did provide pt with the patient assistance form for Eliquis. Pt to fill out his part and MD please fill out  the Rx area along with contact information. Pt will need Rx for 30 day free eliquis no refills. Pt has 30 day free card. CM did set pt a hospital f/u with [facility]. Appointment placed in F/u in EPIC. No further neds from CM at this time.     06/25/2013 1600 NCM spoke to pt and states he goes to [place] for his psych meds. Explained to pt he will need appt with PCP. Discussed [facility]. Explained he can get his meds at a discount rate at clinic if he has appt. Attempted to arrange appt with [facility] and unable to arrange. Left message with clinic. Waiting return call. Pt can have appt with Dr. Marthann Schiller who will accept pt who can eligible for [facility]. Will follow up with Dr. Ashley Royalty office for appt, [phone]. Provided pt with Eliquis free 30 day trial card. Clinic will assist pt with getting additional med. Provided pt with number to contact [facility]. Robert Donning RN CCM Case Mgmt phone [phone]

## 2013-06-26 NOTE — Progress Notes (Signed)
Subjective:  Patient denies any chest pain shortness of breath or palpitation. Remains in A. fib with moderate ventricular response. Patient wants to be treated medically only  Objective:  Vital Signs in the last 24 hours: Temp:  [97.8 F (36.6 C)-98.9 F (37.2 C)] 98.5 F (36.9 C) (05/27 0800) Pulse Rate:  [40-107] 75 (05/27 0449) Resp:  [13-21] 20 (05/27 0449) BP: (114-129)/(76-89) 127/86 mmHg (05/27 0800) SpO2:  [92 %-95 %] 94 % (05/27 0449) Weight:  [80.6 kg (177 lb 11.1 oz)] 80.6 kg (177 lb 11.1 oz) (05/27 0453)  Intake/Output from previous day: 05/26 0701 - 05/27 0700 In: 549.5 [P.O.:360; I.V.:189.5] Out: 600 [Urine:600] Intake/Output from this shift: Total I/O In: 490 [P.O.:360; I.V.:130] Out: -   Physical Exam: Neck: no adenopathy, no carotid bruit, no JVD and supple, symmetrical, trachea midline Lungs: clear to auscultation bilaterally Heart: irregularly irregular rhythm, S1, S2 normal and Soft systolic murmur noted Abdomen: soft, non-tender; bowel sounds normal; no masses,  no organomegaly Extremities: extremities normal, atraumatic, no cyanosis or edema  Lab Results: No results found for this basename: WBC, HGB, PLT,  in the last 72 hours  Recent Labs  06/24/13 0544  NA 141  K 4.2  CL 106  CO2 25  GLUCOSE 81  BUN 6  CREATININE 0.95   No results found for this basename: TROPONINI, CK, MB,  in the last 72 hours Hepatic Function Panel No results found for this basename: PROT, ALBUMIN, AST, ALT, ALKPHOS, BILITOT, BILIDIR, IBILI,  in the last 72 hours No results found for this basename: CHOL,  in the last 72 hours No results found for this basename: PROTIME,  in the last 72 hours  Imaging: Imaging results have been reviewed and No results found.  Cardiac Studies:  Assessment/Plan:  Recurrent atrial fib/ flutter with moderate ventricular response  Hypertension  History of alcohol abuse  History of tobacco abuse  Depression  Resolving Possible right  lung pneumonia Plan Increase beta blockers as per orders Okay to discharge from cardiac point of view OK to switch to Eliquis 5 mg twice daily Followup with me in one week. I will adjust  dose of amiodarone as outpatient after one week  LOS: 5 days    Robynn Pane 06/26/2013, 11:46 AM

## 2013-06-26 NOTE — Progress Notes (Signed)
ANTICOAGULATION CONSULT NOTE - FOLLOW UP  Pharmacy Consult:  Heparin Indication: atrial fibrillation  Allergies  Allergen Reactions  . Bee Venom Anaphylaxis    Uses epi pen-- last used 08/23/11    Patient Measurements: Height: 5\' 6"  (167.6 cm) Weight: 177 lb 11.1 oz (80.6 kg) IBW/kg (Calculated) : 63.8 Heparin Dosing Weight: 83 kg  Vital Signs: Temp: 97.8 F (36.6 C) (05/27 0449) Temp src: Oral (05/27 0449) BP: 127/89 mmHg (05/27 0449) Pulse Rate: 75 (05/27 0449)  Labs:  Recent Labs  06/24/13 0544 06/24/13 1930 06/25/13 0315 06/26/13 0220  HEPARINUNFRC  --  0.24* 0.62 0.70  CREATININE 0.95  --   --   --     Estimated Creatinine Clearance: 94.8 ml/min (by C-G formula based on Cr of 0.95).     Assessment: 68 YOM admitted with Afib with RVR to continue on IV heparin while Eliquis is on hold.  Heparin level is at the high end of normal.  No bleeding reported.   Goal of Therapy:  Heparin level 0.3-0.7 units/ml Monitor platelets by anticoagulation protocol: Yes    Plan:  - Decrease heparin gtt to 1150 units/hr - Check 6 hr HL - Daily HL / CBC    Erika Hussar D. Laney Potash, PharmD, BCPS Pager:  [phone] 06/26/2013, 7:44 AM

## 2013-06-26 NOTE — Discharge Instructions (Signed)
Atrial Fibrillation °Atrial fibrillation is a condition that causes your heart to beat irregularly. It may also cause your heart to beat faster than normal. Atrial fibrillation can prevent your heart from pumping blood normally. It increases your risk of stroke and heart problems. °HOME CARE °· Take medications as told by your doctor. °· Only take medications that your doctor says are safe. Some medications can make the condition worse or happen again. °· If blood thinners were prescribed by your doctor, take them exactly as told. Too much can cause bleeding. Too little and you will not have the needed protection against stroke and other problems. °· Perform blood tests at home if told by your doctor. °· Perform blood tests exactly as told by your doctor. °· Do not drink alcohol. °· Do not drink beverages with caffeine such as coffee, soda, and some teas. °· Maintain a healthy weight. °· Do not use diet pills unless your doctor says they are safe. They may make heart problems worse. °· Follow diet instructions as told by your doctor. °· Exercise regularly as told by your doctor. °· Keep all follow-up appointments. °GET HELP RIGHT AWAY IF:  °· You have chest or belly (abdominal) pain. °· You feel sick to your stomach (nauseous) °· You suddenly have swollen feet and ankles. °· You feel dizzy. °· You face, arms, or legs feel numb or weak. °· There is a change in your vision or speech. °· You notice a change in the speed, rhythm, or strength of your heartbeat. °· You suddenly begin peeing (urinating) more often. °· You get tired more easily when moving or exercising. °MAKE SURE YOU:  °· Understand these instructions. °· Will watch your condition. °· Will get help right away if you are not doing well or get worse. °Document Released: 10/27/2007 Document Revised: 05/14/2012 Document Reviewed: 02/28/2012 °Exit[employer]® Patient Information ©2014 [employer]. ° °

## 2013-06-26 NOTE — Progress Notes (Signed)
Pt & friend supremely anxious after probable vagal episode. Heart rate now in the 60s, BP 99-111 sys. Called DR; new order for anx med. Will continue to monitor.

## 2013-06-26 NOTE — Progress Notes (Signed)
Patient ID: [name], male   DOB: May 08, 1965, 48 y.o.   MRN: [id number] [facility] PROGRESS NOTE  [name] [id number] DOB: 1965-07-02 DOA: 06/21/2013 PCP: No PCP Per Patient  Brief narrative: 48 year old male with history of A. fib, hypertension, alcohol abuse, tobacco abuse, anxiety & major depression, recently hospitalized for A. fib with RVR, found to have major depression & suicidal ideations and discharged to behavioral health center (where he was hospitalized for alcohol detox). Pt has extensive reported history of non-compliance with medications. He was admitted to [facility] 06/21/13 with several episodes of hemoptysis, diarrhea, productive cough, fever and chills. In addition, he had complaints of RUQ abdominal pain. His hospital course was complicated due to A fib with RVR requiring stepdown unit monitoring and treatment with IV amiodarone, IV cardizem and IV heparin. GI was consulted due to concern for cholecystitis. His abdominal pain has eventually spontaneously resolved.   Assessment/Plan:  Principal Problem: PAF with RVR  Secondary to non compliance, alcohol abuse versus hypoxia. Initially, required Cardizem drip but has reverted to NSR and was transferred to tele floor. He was then on metoprolol PO but on 5/25 went back in to A fib again requiring transfer back to SDU.  He was seen by Dr. Sharyn Lull of cardiology who recommended changing amiodarone to PO. As far as anticoagulation is concerned he will likely be discharged with eliquis although no official recommendation yet per cardio. Pt is on IV heparin for now. Current rate control meds are metoprolol 25 mg PO BID and amiodarone 400 mg PO BID  Remains rate controlled overnight RUQ abdominal pain and mildly abnormal LFTs  Abdominal pain improved. Nausea improved.   Blood cultures to date are negative  HIDA scan is negative; low suspicion for cholecystitis since no fever or leukocytosis and pain resolved. Surgery was  consulted bu they signed off with no other recommendations other than supportive care. GI has also seen and evaluated the pt and they too signed off.  LFT's likely elevated due to alcohol related hepatitis. Upper GI bleed  Seems to have resolved.   Hemoglobin remains stable   Continue protonix 40 mg PO BID Tobacco abuse  Cessation counseled. Continue nicotine patch History of alcohol abuse  Reports not drinking since his discharge from [facility] Dehydration  Resolved  Hypertension  Continued metoprolol 25 mg PO BID History of major depression & anxiety  Continue Prozac 20 mg daily and trazodone 50 mg each bedtime for insomnia. Acute respiratory failure/possible HCAP/?COPD  Chest x-ray from 5/24 suggests new right lower lung opacity concerning for pneumonia. Started on Levaquin  Code Status: Full  Family Communication: Discussed with patient. No family at bedside.  Disposition Plan: Transfer to step down unit. Not medically stable for discharge.   Consultants:  General surgery- signed off  Gastroenterology-signed off  Cardiology Procedures:  None Antibiotics:  IV vancomycin and Zosyn 5/21 > 5/23  Oral levofloxacin 5/24 >  Jerald Kief, MD  [facility] Pager [phone]  If 7PM-7AM, please contact night-coverage [email or web address] Password [email or web address] 06/26/2013, 9:28 AM   LOS: 5 days    HPI/Subjective: Says he feels better this am.   Objective: Filed Vitals:   06/25/13 2305 06/26/13 0449 06/26/13 0453 06/26/13 0800  BP: 120/85 127/89    Pulse: 72 75    Temp: 98.5 F (36.9 C) 97.8 F (36.6 C)  98.5 F (36.9 C)  TempSrc: Oral Oral  Oral  Resp: 13 20    Height:      Weight:  80.6 kg (177 lb 11.1 oz)   SpO2: 93% 94%      Intake/Output Summary (Last 24 hours) at 06/26/13 [id number] Last data filed at 06/25/13 2306  Gross per 24 hour  Intake  505.8 ml  Output    200 ml  Net  305.8 ml    Exam:   General:  Pt is alert, follows commands appropriately, not in  acute distress  Cardiovascular: irregular rhythm, rate controlled, S1/S2 appreciated, soft systolic ejection murmur apprecaited  Respiratory: Coarse breath sounds, no wheezing   Abdomen: Soft, non tender, non distended, bowel sounds present, no guarding  Extremities: No edema, pulses DP and PT palpable bilaterally  Neuro: Grossly nonfocal  Data Reviewed: Basic Metabolic Panel:  Recent Labs Lab 06/21/13 0045 06/21/13 0056 06/22/13 0337 06/24/13 0544  NA 139 141 138 141  K 3.6* 3.5* 4.3 4.2  CL 107 108 111 106  CO2 18*  --  18* 25  GLUCOSE 86 83 87 81  BUN 17 15 10 6   CREATININE 0.77 0.90 0.84 0.95  CALCIUM 8.1*  --  7.8* 8.6   Liver Function Tests:  Recent Labs Lab 06/21/13 0045 06/22/13 0337  AST 71* 36  ALT 148* 99*  ALKPHOS 80 65  BILITOT 0.3 0.7  PROT 6.1 5.1*  ALBUMIN 3.2* 2.5*    Recent Labs Lab 06/21/13 0045  LIPASE 20   No results found for this basename: AMMONIA,  in the last 168 hours CBC:  Recent Labs Lab 06/21/13 0045 06/21/13 0056 06/22/13 0337  WBC 6.3  --  4.5  NEUTROABS 3.3  --   --   HGB 12.8* 13.6 12.8*  HCT 36.8* 40.0 38.9*  MCV 94.8  --  97.0  PLT 186  --  151   Cardiac Enzymes: No results found for this basename: CKTOTAL, CKMB, CKMBINDEX, TROPONINI,  in the last 168 hours BNP: No components found with this basename: POCBNP,  CBG:  Recent Labs Lab 06/22/13 0024  GLUCAP 123*    CULTURE, BLOOD (ROUTINE X 2)     Status: None   Collection Time    06/21/13  7:55 AM      Result Value Ref Range Status   Specimen Description BLOOD RIGHT ARM   Final   Value:        BLOOD CULTURE RECEIVED NO GROWTH TO DATE CULTURE WILL BE HELD FOR 5 DAYS BEFORE ISSUING A FINAL NEGATIVE REPORT     Performed at [facility]   Report Status PENDING   Incomplete  CULTURE, BLOOD (ROUTINE X 2)     Status: None   Collection Time    06/21/13  8:10 AM      Result Value Ref Range Status   Specimen Description BLOOD LEFT ARM   Final    Value:        BLOOD CULTURE RECEIVED NO GROWTH TO DATE CULTURE WILL BE HELD FOR 5 DAYS BEFORE ISSUING A FINAL NEGATIVE REPORT     Performed at [facility]   Report Status PENDING   Incomplete  URINE CULTURE     Status: None   Collection Time    06/21/13  8:26 AM      Result Value Ref Range Status   Specimen Description URINE, CLEAN CATCH   Final   Value: NO GROWTH     Performed at [facility]   Report Status 06/22/2013 FINAL   Final  MRSA PCR SCREENING     Status: None   Collection  Time    06/21/13  4:36 PM      Result Value Ref Range Status   MRSA by PCR NEGATIVE  NEGATIVE Final  CULTURE, EXPECTORATED SPUTUM-ASSESSMENT     Status: None   Collection Time    06/22/13  6:17 PM      Result Value Ref Range Status   Specimen Description SPUTUM   Final   Special Requests NONE   Final   Sputum evaluation     Final   Value: THIS SPECIMEN IS ACCEPTABLE. RESPIRATORY CULTURE REPORT TO FOLLOW.   Report Status 06/22/2013 FINAL   Final  CULTURE, RESPIRATORY (NON-EXPECTORATED)     Status: None   Collection Time    06/22/13  6:17 PM      Result Value Ref Range Status   Specimen Description SPUTUM   Final   Value: NORMAL OROPHARYNGEAL FLORA     Performed at [facility]   Report Status 06/25/2013 FINAL   Final  MRSA PCR SCREENING     Status: None   Collection Time    06/24/13  2:05 PM      Result Value Ref Range Status   MRSA by PCR NEGATIVE  NEGATIVE Final     Studies: No results found.  Scheduled Meds: . amiodarone  400 mg Oral BID  . FLUoxetine  20 mg Oral Daily  . levofloxacin  750 mg Oral Daily  . metoprolol tartrate  25 mg Oral BID  . nicotine  14 mg Transdermal Daily  . pantoprazole  40 mg Oral BID  . thiamine  100 mg Oral Daily  . traZODone  50 mg Oral QHS   Continuous Infusions: . heparin

## 2013-06-26 NOTE — Progress Notes (Addendum)
Pt noted to have bradycardia into the 30's, started while pt was on commode moving bowels. Associated with dizziness, diaphoresis, and nausea but no LOC. Pt returned to bed, HR slowly improved. Appears to be sinus brady on monitor. EKG ordered, pending. D/w Cardiology. Recs to decrease amiodarone to 200mg  daily. Per Cardiology, monitor for several hours and OK to d/c if HR over 50. If pt remains bradycardic, would observe overnight. Will follow.

## 2013-06-26 NOTE — Progress Notes (Signed)
ANTICOAGULATION CONSULT NOTE - FOLLOW UP  Pharmacy Consult:  Heparin Indication: atrial fibrillation  Allergies  Allergen Reactions  . Bee Venom Anaphylaxis    Uses epi pen-- last used 08/23/11    Patient Measurements: Height: 5\' 6"  (167.6 cm) Weight: 177 lb 11.1 oz (80.6 kg) IBW/kg (Calculated) : 63.8 Heparin Dosing Weight: 83 kg  Vital Signs: Temp: 98.9 F (37.2 C) (05/27 1500) Temp src: Oral (05/27 1500) BP: 99/68 mmHg (05/27 1430)  Labs:  Recent Labs  06/24/13 0544  06/25/13 0315 06/26/13 0220 06/26/13 1443  HEPARINUNFRC  --   < > 0.62 0.70 0.42  CREATININE 0.95  --   --   --   --   < > = values in this interval not displayed.  Estimated Creatinine Clearance: 94.8 ml/min (by C-G formula based on Cr of 0.95).     Assessment: [name] admitted with Afib with RVR to continue on IV heparin while Eliquis is on hold.  Heparin level is therapeutic at 0.42 after rate decreased from 1300 to 1150 units/hr.  No bleeding reported.  Orders written to discharge pt home on Eliquis 5 mg po bid, but he had a probable vagal episode earlier.     Goal of Therapy:  Heparin level 0.3-0.7 units/ml Monitor platelets by anticoagulation protocol: Yes    Plan:  - continue heparin gtt at 1150 units/hr - Daily HL / CBC - plan to discharge home on Eliquis 5 mg po BID  Herby Abraham, Pharm.D. [phone] 06/26/2013 4:56 PM

## 2013-06-27 LAB — BASIC METABOLIC PANEL
BUN: 8 mg/dL (ref 6–23)
CALCIUM: 8.8 mg/dL (ref 8.4–10.5)
CO2: 23 meq/L (ref 19–32)
Chloride: 106 mEq/L (ref 96–112)
Creatinine, Ser: 0.97 mg/dL (ref 0.50–1.35)
GFR calc Af Amer: >90 mL/min (ref >90)
GFR calc non Af Amer: >90 mL/min (ref >90)
GLUCOSE: 87 mg/dL (ref 70–99)
Potassium: 3.7 mEq/L (ref 3.7–5.3)
Sodium: 140 mEq/L (ref 137–147)

## 2013-06-27 LAB — CULTURE, BLOOD (ROUTINE X 2)
Culture: NO GROWTH
Culture: NO GROWTH

## 2013-06-27 LAB — CBC
HCT: 41.3 % (ref 39.0–52.0)
HEMOGLOBIN: 14 g/dL (ref 13.0–17.0)
MCH: 32 pg (ref 26.0–34.0)
MCHC: 33.9 g/dL (ref 30.0–36.0)
MCV: 94.5 fL (ref 78.0–100.0)
PLATELETS: 157 10*3/uL (ref 150–400)
RBC: 4.37 MIL/uL (ref 4.22–5.81)
RDW: 13.7 % (ref 11.5–15.5)
WBC: 5.3 10*3/uL (ref 4.0–10.5)

## 2013-06-27 LAB — HEPARIN LEVEL (UNFRACTIONATED): Heparin Unfractionated: 0.55 IU/mL (ref 0.30–0.70)

## 2013-06-27 MED ORDER — AMIODARONE HCL 200 MG PO TABS: 200.0000 mg | ORAL_TABLET | Freq: Every day | ORAL | Status: DC

## 2013-06-27 MED ORDER — APIXABAN 5 MG PO TABS: 5.0000 mg | ORAL_TABLET | Freq: Two times a day (BID) | ORAL | Status: DC

## 2013-06-27 NOTE — Progress Notes (Signed)
Patient ID: Robert OchsJohn [name], male   DOB: 07/13/1965, 48 y.o.   MRN: [id number] [facility] PROGRESS NOTE  Robert OchsJohn [name] [id number] DOB: 03/03/1965 DOA: 06/21/2013 PCP: No PCP Per Patient  Brief narrative: 48 year old male with history of A. fib, hypertension, alcohol abuse, tobacco abuse, anxiety & major depression, recently hospitalized for A. fib with RVR, found to have major depression & suicidal ideations and discharged to behavioral health center (where he was hospitalized for alcohol detox). Pt has extensive reported history of non-compliance with medications. He was [facility]/21/13 with several episodes of hemoptysis, diarrhea, productive cough, fever and chills. In addition, he had complaints of RUQ abdominal pain. His hospital course was complicated due to A fib with RVR requiring stepdown unit monitoring and treatment with IV amiodarone, IV cardizem and IV heparin. GI was consulted due to concern for cholecystitis. His abdominal pain has eventually spontaneously resolved.   Assessment/Plan:  Principal Problem: PAF with RVR  Secondary to non compliance, alcohol abuse versus hypoxia. Initially, required Cardizem drip but has reverted to NSR and was transferred to tele floor. He was then on metoprolol PO but on 5/25 went back in to A fib again requiring transfer back to SDU.  He was seen by Dr. Sharyn LullHarwani of cardiology who recommended changing amiodarone to PO. As far as anticoagulation is concerned he will likely be discharged with eliquis although no official recommendation yet per cardio. Pt is on IV heparin for now. Was on metoprolol 25 mg PO BID and amiodarone 400 mg PO BID  Suspected vagal episode yesterday with sinus brady on EKG. D/w Cardiology with recs to decrease amiodarone to 200mg  daily.  Pt remains sinus bradycardic overnight RUQ abdominal pain and mildly abnormal LFTs  Abdominal pain improved. Nausea improved.   Blood cultures to date are negative  HIDA scan is  negative; low suspicion for cholecystitis since no fever or leukocytosis and pain resolved. Surgery was consulted bu they signed off with no other recommendations other than supportive care. GI has also seen and evaluated the pt and they too signed off.  LFT's likely elevated due to alcohol related hepatitis. Upper GI bleed  Seems to have resolved.   Hemoglobin remains stable   Continue protonix 40 mg PO BID Tobacco abuse  Cessation counseled. Continue nicotine patch History of alcohol abuse  Reports not drinking since his [facility] Dehydration  Resolved  Hypertension  Continued metoprolol 25 mg PO BID History of major depression & anxiety  Continue Prozac 20 mg daily and trazodone 50 mg each bedtime for insomnia  PRN xanax ordered. Acute respiratory failure/possible HCAP/?COPD  Chest x-ray from 5/24 suggests new right lower lung opacity concerning for pneumonia. Started on Levaquin  Code Status: Full  Family Communication: Pt and fiance in room Disposition Plan: Pending   Consultants:  General surgery- signed off  Gastroenterology-signed off  Cardiology Procedures:  None Antibiotics:  IV vancomycin and Zosyn 5/21 > 5/23  Oral levofloxacin 5/24 >  Jerald KiefStephen K Chiu, MD  [facility] Pager [phone]  If 7PM-7AM, please contact night-coverage [email or web address] Password [email or web address] 06/27/2013, 8:30 AM   LOS: 6 days    HPI/Subjective: Says he feels better this am.   Objective: Filed Vitals:   06/27/13 0042 06/27/13 0429 06/27/13 0500 06/27/13 0752  BP: 109/63 123/69  135/86  Pulse: 52 52  53  Temp: 97.6 F (36.4 C) 97.8 F (36.6 C)    TempSrc: Oral Oral    Resp: 17 19  13   Height:  Weight:   78.2 kg (172 lb 6.4 oz)   SpO2: 95% 94%  92%    Intake/Output Summary (Last 24 hours) at 06/27/13 0830 Last data filed at 06/27/13 0430  Gross per 24 hour  Intake 822.75 ml  Output    850 ml  Net -27.25 ml    Exam:   General:  Pt is alert,  follows commands appropriately, not in acute distress  Cardiovascular: irregular rhythm, rate controlled, S1/S2 appreciated, soft systolic ejection murmur apprecaited  Respiratory: Coarse breath sounds, no wheezing   Abdomen: Soft, non tender, non distended, bowel sounds present, no guarding  Extremities: No edema, pulses DP and PT palpable bilaterally  Neuro: Grossly nonfocal  Data Reviewed: Basic Metabolic Panel:  Recent Labs Lab 06/21/13 0045 06/21/13 0056 06/22/13 0337 06/24/13 0544 06/27/13 0410  NA 139 141 138 141 140  K 3.6* 3.5* 4.3 4.2 3.7  CL 107 108 111 106 106  CO2 18*  --  18* 25 23  GLUCOSE 86 83 87 81 87  BUN 17 15 10 6 8   CREATININE 0.77 0.90 0.84 0.95 0.97  CALCIUM 8.1*  --  7.8* 8.6 8.8   Liver Function Tests:  Recent Labs Lab 06/21/13 0045 06/22/13 0337  AST 71* 36  ALT 148* 99*  ALKPHOS 80 65  BILITOT 0.3 0.7  PROT 6.1 5.1*  ALBUMIN 3.2* 2.5*    Recent Labs Lab 06/21/13 0045  LIPASE 20   No results found for this basename: AMMONIA,  in the last 168 hours CBC:  Recent Labs Lab 06/21/13 0045 06/21/13 0056 06/22/13 0337 06/27/13 0410  WBC 6.3  --  4.5 5.3  NEUTROABS 3.3  --   --   --   HGB 12.8* 13.6 12.8* 14.0  HCT 36.8* 40.0 38.9* 41.3  MCV 94.8  --  97.0 94.5  PLT 186  --  151 157   Cardiac Enzymes: No results found for this basename: CKTOTAL, CKMB, CKMBINDEX, TROPONINI,  in the last 168 hours BNP: No components found with this basename: POCBNP,  CBG:  Recent Labs Lab 06/22/13 0024  GLUCAP 123*    CULTURE, BLOOD (ROUTINE X 2)     Status: None   Collection Time    06/21/13  7:55 AM      Result Value Ref Range Status   Specimen Description BLOOD RIGHT ARM   Final   Value:        BLOOD CULTURE RECEIVED NO GROWTH TO DATE CULTURE WILL BE HELD FOR 5 DAYS BEFORE ISSUING A FINAL NEGATIVE REPORT     Performed at [facility]   Report Status PENDING   Incomplete  CULTURE, BLOOD (ROUTINE X 2)     Status: None    Collection Time    06/21/13  8:10 AM      Result Value Ref Range Status   Specimen Description BLOOD LEFT ARM   Final   Value:        BLOOD CULTURE RECEIVED NO GROWTH TO DATE CULTURE WILL BE HELD FOR 5 DAYS BEFORE ISSUING A FINAL NEGATIVE REPORT     Performed at [facility]   Report Status PENDING   Incomplete  URINE CULTURE     Status: None   Collection Time    06/21/13  8:26 AM      Result Value Ref Range Status   Specimen Description URINE, CLEAN CATCH   Final   Value: NO GROWTH     Performed at [facility]  Report Status 06/22/2013 FINAL   Final  MRSA PCR SCREENING     Status: None   Collection Time    06/21/13  4:36 PM      Result Value Ref Range Status   MRSA by PCR NEGATIVE  NEGATIVE Final  CULTURE, EXPECTORATED SPUTUM-ASSESSMENT     Status: None   Collection Time    06/22/13  6:17 PM      Result Value Ref Range Status   Specimen Description SPUTUM   Final   Special Requests NONE   Final   Sputum evaluation     Final   Value: THIS SPECIMEN IS ACCEPTABLE. RESPIRATORY CULTURE REPORT TO FOLLOW.   Report Status 06/22/2013 FINAL   Final  CULTURE, RESPIRATORY (NON-EXPECTORATED)     Status: None   Collection Time    06/22/13  6:17 PM      Result Value Ref Range Status   Specimen Description SPUTUM   Final   Value: NORMAL OROPHARYNGEAL FLORA     Performed at [facility]   Report Status 06/25/2013 FINAL   Final  MRSA PCR SCREENING     Status: None   Collection Time    06/24/13  2:05 PM      Result Value Ref Range Status   MRSA by PCR NEGATIVE  NEGATIVE Final     Studies: No results found.  Scheduled Meds: . amiodarone  400 mg Oral BID  . FLUoxetine  20 mg Oral Daily  . levofloxacin  750 mg Oral Daily  . metoprolol tartrate  25 mg Oral BID  . nicotine  14 mg Transdermal Daily  . pantoprazole  40 mg Oral BID  . thiamine  100 mg Oral Daily  . traZODone  50 mg Oral QHS   Continuous Infusions: . heparin 1,150 Units/hr (06/26/13 0900)

## 2013-06-27 NOTE — Progress Notes (Signed)
ANTICOAGULATION CONSULT NOTE - FOLLOW UP  Pharmacy Consult:  Heparin Indication: atrial fibrillation  Allergies  Allergen Reactions  . Bee Venom Anaphylaxis    Uses epi pen-- last used 08/23/11    Patient Measurements: Height: 5\' 6"  (167.6 cm) Weight: 172 lb 6.4 oz (78.2 kg) IBW/kg (Calculated) : 63.8 Heparin Dosing Weight: 83 kg  Vital Signs: Temp: 97.6 F (36.4 C) (05/28 0752) Temp src: Oral (05/28 0752) BP: 135/86 mmHg (05/28 0752) Pulse Rate: 53 (05/28 0752)  Labs:  Recent Labs  06/26/13 0220 06/26/13 1443 06/27/13 0410  HGB  --   --  14.0  HCT  --   --  41.3  PLT  --   --  157  HEPARINUNFRC 0.70 0.42 0.55  CREATININE  --   --  0.97    Estimated Creatinine Clearance: 91.7 ml/min (by C-G formula based on Cr of 0.97).     Assessment: 35 YOM admitted with Afib with RVR to continue on IV heparin while Eliquis is on hold.  Heparin level is therapeutic.  No bleeding reported.   Goal of Therapy:  Heparin level 0.3-0.7 units/ml Monitor platelets by anticoagulation protocol: Yes    Plan:  - Continue heparin gtt at 1150 units/hr - Daily HL / CBC - F/U abx LOT    Juliana Boling D. Laney Potash, PharmD, BCPS Pager:  [phone] 06/27/2013, 10:43 AM

## 2013-06-27 NOTE — Progress Notes (Signed)
Pt to d/c home today. Reviewed d/c inst, follow up appts, meds arranged for pick up by case mgt.

## 2013-06-27 NOTE — Progress Notes (Signed)
Patient was discharged per MD order. I walked both patient and signifigant other to the [facility] building to pick up patients medications. Prescriptions were faxed per CSW. Also pointed out bus stop closet to [employer], made sure patient had bus passes in hand.

## 2013-07-08 ENCOUNTER — Inpatient Hospital Stay: Payer: Self-pay | Admitting: Internal Medicine

## 2013-07-27 ENCOUNTER — Inpatient Hospital Stay (HOSPITAL_COMMUNITY)
Admission: EM | Admit: 2013-07-27 | Discharge: 2013-07-31 | DRG: 310 | Disposition: A | Discharge status: Home / Self Care | Payer: MEDICAID | Attending: Internal Medicine | Admitting: Internal Medicine

## 2013-07-27 ENCOUNTER — Encounter (HOSPITAL_COMMUNITY): Payer: Self-pay | Admitting: Emergency Medicine

## 2013-07-27 DIAGNOSIS — Z9119 Patient's noncompliance with other medical treatment and regimen: Secondary | ICD-10-CM

## 2013-07-27 DIAGNOSIS — F17200 Nicotine dependence, unspecified, uncomplicated: Secondary | ICD-10-CM | POA: Diagnosis present

## 2013-07-27 DIAGNOSIS — I4891 Unspecified atrial fibrillation: Secondary | ICD-10-CM

## 2013-07-27 DIAGNOSIS — R55 Syncope and collapse: Secondary | ICD-10-CM

## 2013-07-27 DIAGNOSIS — F419 Anxiety disorder, unspecified: Secondary | ICD-10-CM

## 2013-07-27 DIAGNOSIS — R079 Chest pain, unspecified: Secondary | ICD-10-CM | POA: Diagnosis present

## 2013-07-27 DIAGNOSIS — M79605 Pain in left leg: Secondary | ICD-10-CM | POA: Diagnosis present

## 2013-07-27 DIAGNOSIS — Z720 Tobacco use: Secondary | ICD-10-CM

## 2013-07-27 DIAGNOSIS — I9589 Other hypotension: Secondary | ICD-10-CM

## 2013-07-27 DIAGNOSIS — I10 Essential (primary) hypertension: Secondary | ICD-10-CM | POA: Diagnosis present

## 2013-07-27 DIAGNOSIS — IMO0001 Reserved for inherently not codable concepts without codable children: Secondary | ICD-10-CM | POA: Diagnosis present

## 2013-07-27 DIAGNOSIS — I498 Other specified cardiac arrhythmias: Secondary | ICD-10-CM | POA: Diagnosis present

## 2013-07-27 DIAGNOSIS — F10929 Alcohol use, unspecified with intoxication, unspecified: Secondary | ICD-10-CM

## 2013-07-27 DIAGNOSIS — I959 Hypotension, unspecified: Secondary | ICD-10-CM | POA: Diagnosis present

## 2013-07-27 DIAGNOSIS — M25559 Pain in unspecified hip: Secondary | ICD-10-CM | POA: Diagnosis present

## 2013-07-27 DIAGNOSIS — F329 Major depressive disorder, single episode, unspecified: Secondary | ICD-10-CM | POA: Diagnosis present

## 2013-07-27 DIAGNOSIS — F1020 Alcohol dependence, uncomplicated: Secondary | ICD-10-CM

## 2013-07-27 DIAGNOSIS — F3289 Other specified depressive episodes: Secondary | ICD-10-CM | POA: Diagnosis present

## 2013-07-27 DIAGNOSIS — Z8249 Family history of ischemic heart disease and other diseases of the circulatory system: Secondary | ICD-10-CM

## 2013-07-27 DIAGNOSIS — Z91199 Patient's noncompliance with other medical treatment and regimen due to unspecified reason: Secondary | ICD-10-CM

## 2013-07-27 DIAGNOSIS — I4892 Unspecified atrial flutter: Principal | ICD-10-CM | POA: Diagnosis present

## 2013-07-27 DIAGNOSIS — J9601 Acute respiratory failure with hypoxia: Secondary | ICD-10-CM

## 2013-07-27 LAB — CBC WITH DIFFERENTIAL/PLATELET
BASOS ABS: 0 10*3/uL (ref 0.0–0.1)
BASOS PCT: 1 % (ref 0–1)
EOS ABS: 0.2 10*3/uL (ref 0.0–0.7)
Eosinophils Relative: 3 % (ref 0–5)
HCT: 44.1 % (ref 39.0–52.0)
Hemoglobin: 15.3 g/dL (ref 13.0–17.0)
LYMPHS ABS: 2.9 10*3/uL (ref 0.7–4.0)
Lymphocytes Relative: 50 % — ABNORMAL HIGH (ref 12–46)
MCH: 31.9 pg (ref 26.0–34.0)
MCHC: 34.7 g/dL (ref 30.0–36.0)
MCV: 92.1 fL (ref 78.0–100.0)
Monocytes Absolute: 0.6 10*3/uL (ref 0.1–1.0)
Monocytes Relative: 10 % (ref 3–12)
NEUTROS PCT: 36 % — AB (ref 43–77)
Neutro Abs: 2.1 10*3/uL (ref 1.7–7.7)
PLATELETS: 199 10*3/uL (ref 150–400)
RBC: 4.79 MIL/uL (ref 4.22–5.81)
RDW: 12.9 % (ref 11.5–15.5)
WBC: 5.8 10*3/uL (ref 4.0–10.5)

## 2013-07-27 LAB — BASIC METABOLIC PANEL
BUN: 9 mg/dL (ref 6–23)
CALCIUM: 8.8 mg/dL (ref 8.4–10.5)
CO2: 22 mEq/L (ref 19–32)
Chloride: 103 mEq/L (ref 96–112)
Creatinine, Ser: 0.87 mg/dL (ref 0.50–1.35)
GFR calc non Af Amer: >90 mL/min (ref >90)
GLUCOSE: 84 mg/dL (ref 70–99)
POTASSIUM: 3.9 meq/L (ref 3.7–5.3)
SODIUM: 141 meq/L (ref 137–147)

## 2013-07-27 LAB — ETHANOL: ALCOHOL ETHYL (B): 217 mg/dL — AB (ref 0–11)

## 2013-07-27 MED ORDER — SODIUM CHLORIDE 0.9 % IV SOLN
1000.0000 mL | Freq: Once | INTRAVENOUS | Status: AC
  Administered 2013-07-27: 1000 mL via INTRAVENOUS

## 2013-07-27 MED ORDER — MORPHINE SULFATE 4 MG/ML IJ SOLN: 4.0000 mg | Freq: Once | INTRAMUSCULAR | Status: DC

## 2013-07-27 MED ORDER — SODIUM CHLORIDE 0.9 % IV SOLN: 1000.0000 mL | Freq: Once | INTRAVENOUS | Status: DC

## 2013-07-27 MED ORDER — SODIUM CHLORIDE 0.9 % IV SOLN
1000.0000 mL | INTRAVENOUS | Status: DC
  Administered 2013-07-27: 1000 mL via INTRAVENOUS

## 2013-07-27 NOTE — ED Notes (Signed)
Pt endores SI for "a long time" plan to use razor blade, pt states he became aware of these feels today while at water park after drinking 2 40 oz beers.  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 Pain radiates from back to LLE and feels pain is now trying to travel to R leg.

## 2013-07-27 NOTE — ED Notes (Signed)
Per EMS- [place] public park. Drinking alcohol upon arrival and says he is suicidal. Says he took his meds when he drank alcohol ("two 40's). EMS unable to read medication labels d/t fading. CBG 114 BP 114/70 HR 80 RR 16. Hx anxiety, depression, chronic pain. Moving all extremities for EMS. A&Ox4.

## 2013-07-28 DIAGNOSIS — R079 Chest pain, unspecified: Secondary | ICD-10-CM

## 2013-07-28 DIAGNOSIS — I10 Essential (primary) hypertension: Secondary | ICD-10-CM

## 2013-07-28 DIAGNOSIS — I4891 Unspecified atrial fibrillation: Secondary | ICD-10-CM

## 2013-07-28 DIAGNOSIS — I959 Hypotension, unspecified: Secondary | ICD-10-CM | POA: Diagnosis present

## 2013-07-28 DIAGNOSIS — Z9119 Patient's noncompliance with other medical treatment and regimen: Secondary | ICD-10-CM

## 2013-07-28 DIAGNOSIS — M7989 Other specified soft tissue disorders: Secondary | ICD-10-CM

## 2013-07-28 DIAGNOSIS — R55 Syncope and collapse: Secondary | ICD-10-CM

## 2013-07-28 DIAGNOSIS — M79609 Pain in unspecified limb: Secondary | ICD-10-CM

## 2013-07-28 DIAGNOSIS — F1010 Alcohol abuse, uncomplicated: Secondary | ICD-10-CM

## 2013-07-28 DIAGNOSIS — I9589 Other hypotension: Secondary | ICD-10-CM

## 2013-07-28 DIAGNOSIS — F1020 Alcohol dependence, uncomplicated: Secondary | ICD-10-CM

## 2013-07-28 DIAGNOSIS — F17200 Nicotine dependence, unspecified, uncomplicated: Secondary | ICD-10-CM

## 2013-07-28 DIAGNOSIS — I4892 Unspecified atrial flutter: Principal | ICD-10-CM | POA: Insufficient documentation

## 2013-07-28 DIAGNOSIS — Z91199 Patient's noncompliance with other medical treatment and regimen due to unspecified reason: Secondary | ICD-10-CM

## 2013-07-28 DIAGNOSIS — M79605 Pain in left leg: Secondary | ICD-10-CM | POA: Diagnosis present

## 2013-07-28 LAB — MRSA PCR SCREENING: MRSA BY PCR: NEGATIVE

## 2013-07-28 LAB — CBC
HCT: 37.7 % — ABNORMAL LOW (ref 39.0–52.0)
HEMOGLOBIN: 12.9 g/dL — AB (ref 13.0–17.0)
MCH: 32.1 pg (ref 26.0–34.0)
MCHC: 34.2 g/dL (ref 30.0–36.0)
MCV: 93.8 fL (ref 78.0–100.0)
Platelets: 164 10*3/uL (ref 150–400)
RBC: 4.02 MIL/uL — ABNORMAL LOW (ref 4.22–5.81)
RDW: 13.2 % (ref 11.5–15.5)
WBC: 4.1 10*3/uL (ref 4.0–10.5)

## 2013-07-28 LAB — BASIC METABOLIC PANEL
BUN: 8 mg/dL (ref 6–23)
CO2: 20 mEq/L (ref 19–32)
Calcium: 7.5 mg/dL — ABNORMAL LOW (ref 8.4–10.5)
Chloride: 111 mEq/L (ref 96–112)
Creatinine, Ser: 0.79 mg/dL (ref 0.50–1.35)
GFR calc Af Amer: >90 mL/min (ref >90)
Glucose, Bld: 71 mg/dL (ref 70–99)
POTASSIUM: 4.1 meq/L (ref 3.7–5.3)
SODIUM: 143 meq/L (ref 137–147)

## 2013-07-28 LAB — TROPONIN I
Troponin I: <0.3 ng/mL (ref <0.30)
Troponin I: <0.3 ng/mL (ref <0.30)

## 2013-07-28 MED ORDER — DILTIAZEM LOAD VIA INFUSION
15.0000 mg | Freq: Once | INTRAVENOUS | Status: AC
  Administered 2013-07-28: 15 mg via INTRAVENOUS
  Filled 2013-07-28: qty 15

## 2013-07-28 MED ORDER — VITAMIN B-1 100 MG PO TABS
100.0000 mg | ORAL_TABLET | Freq: Every day | ORAL | Status: DC
  Administered 2013-07-28 – 2013-07-31 (×4): 100 mg via ORAL
  Filled 2013-07-28 (×4): qty 1

## 2013-07-28 MED ORDER — APIXABAN 5 MG PO TABS
5.0000 mg | ORAL_TABLET | Freq: Two times a day (BID) | ORAL | Status: DC
  Administered 2013-07-28 – 2013-07-31 (×7): 5 mg via ORAL
  Filled 2013-07-28 (×9): qty 1

## 2013-07-28 MED ORDER — METOPROLOL TARTRATE 1 MG/ML IV SOLN
5.0000 mg | Freq: Once | INTRAVENOUS | Status: AC
  Administered 2013-07-28: 5 mg via INTRAVENOUS
  Filled 2013-07-28: qty 5

## 2013-07-28 MED ORDER — DEXTROSE 5 % IV SOLN
5.0000 mg/h | INTRAVENOUS | Status: DC
  Administered 2013-07-28: 5 mg/h via INTRAVENOUS

## 2013-07-28 MED ORDER — SODIUM CHLORIDE 0.9 % IV BOLUS (SEPSIS)
1000.0000 mL | Freq: Once | INTRAVENOUS | Status: AC
  Administered 2013-07-28: 1000 mL via INTRAVENOUS

## 2013-07-28 MED ORDER — METOPROLOL TARTRATE 1 MG/ML IV SOLN
2.5000 mg | INTRAVENOUS | Status: AC | PRN
  Administered 2013-07-28 (×2): 2.5 mg via INTRAVENOUS

## 2013-07-28 MED ORDER — LORAZEPAM 2 MG/ML IJ SOLN
0.0000 mg | Freq: Two times a day (BID) | INTRAMUSCULAR | Status: DC
Start: 2013-07-30 — End: 2013-07-31
  Filled 2013-07-28: qty 1

## 2013-07-28 MED ORDER — ADULT MULTIVITAMIN W/MINERALS CH
1.0000 | ORAL_TABLET | Freq: Every day | ORAL | Status: DC
  Administered 2013-07-28 – 2013-07-31 (×4): 1 via ORAL
  Filled 2013-07-28 (×4): qty 1

## 2013-07-28 MED ORDER — THIAMINE HCL 100 MG/ML IJ SOLN
100.0000 mg | Freq: Every day | INTRAMUSCULAR | Status: DC
  Filled 2013-07-28: qty 1

## 2013-07-28 MED ORDER — AMIODARONE LOAD VIA INFUSION
150.0000 mg | Freq: Once | INTRAVENOUS | Status: AC
  Administered 2013-07-28: 150 mg via INTRAVENOUS
  Filled 2013-07-28: qty 83.34

## 2013-07-28 MED ORDER — SODIUM CHLORIDE 0.9 % IV BOLUS (SEPSIS)
500.0000 mL | Freq: Once | INTRAVENOUS | Status: AC
  Administered 2013-07-28: 500 mL via INTRAVENOUS

## 2013-07-28 MED ORDER — LORAZEPAM 1 MG PO TABS
1.0000 mg | ORAL_TABLET | Freq: Four times a day (QID) | ORAL | Status: AC | PRN
Start: 2013-07-28 — End: 2013-07-31

## 2013-07-28 MED ORDER — FOLIC ACID 1 MG PO TABS
1.0000 mg | ORAL_TABLET | Freq: Every day | ORAL | Status: DC
  Administered 2013-07-28 – 2013-07-31 (×4): 1 mg via ORAL
  Filled 2013-07-28 (×5): qty 1

## 2013-07-28 MED ORDER — METOPROLOL TARTRATE 1 MG/ML IV SOLN
INTRAVENOUS | Status: AC
  Administered 2013-07-28: 2.5 mg via INTRAVENOUS
  Filled 2013-07-28: qty 5

## 2013-07-28 MED ORDER — AMIODARONE HCL IN DEXTROSE 360-4.14 MG/200ML-% IV SOLN
60.0000 mg/h | INTRAVENOUS | Status: AC
  Administered 2013-07-28: 60 mg/h via INTRAVENOUS
  Filled 2013-07-28: qty 200

## 2013-07-28 MED ORDER — SODIUM CHLORIDE 0.9 % IV SOLN
INTRAVENOUS | Status: DC
  Administered 2013-07-28 – 2013-07-31 (×8): via INTRAVENOUS

## 2013-07-28 MED ORDER — METOPROLOL TARTRATE 1 MG/ML IV SOLN
5.0000 mg | Freq: Once | INTRAVENOUS | Status: AC
  Administered 2013-07-28: 2.5 mg via INTRAVENOUS

## 2013-07-28 MED ORDER — OXYCODONE HCL 5 MG PO TABS: 5.0000 mg | ORAL_TABLET | ORAL | Status: DC | PRN

## 2013-07-28 MED ORDER — LORAZEPAM 2 MG/ML IJ SOLN
0.0000 mg | Freq: Four times a day (QID) | INTRAMUSCULAR | Status: AC
  Administered 2013-07-28: 1 mg via INTRAVENOUS
  Administered 2013-07-28: 2 mg via INTRAVENOUS
  Administered 2013-07-28: 1 mg via INTRAVENOUS
  Filled 2013-07-28 (×3): qty 1

## 2013-07-28 MED ORDER — METOPROLOL TARTRATE 25 MG PO TABS
25.0000 mg | ORAL_TABLET | Freq: Two times a day (BID) | ORAL | Status: DC
  Administered 2013-07-28 – 2013-07-29 (×2): 25 mg via ORAL
  Filled 2013-07-28 (×4): qty 1

## 2013-07-28 MED ORDER — ONDANSETRON HCL 4 MG/2ML IJ SOLN
4.0000 mg | Freq: Four times a day (QID) | INTRAMUSCULAR | Status: DC | PRN
  Administered 2013-07-29: 4 mg via INTRAVENOUS
  Filled 2013-07-28: qty 2

## 2013-07-28 MED ORDER — LORAZEPAM 2 MG/ML IJ SOLN
1.0000 mg | Freq: Four times a day (QID) | INTRAMUSCULAR | Status: AC | PRN
Start: 2013-07-28 — End: 2013-07-31
  Administered 2013-07-28: 1 mg via INTRAVENOUS
  Filled 2013-07-28: qty 1

## 2013-07-28 MED ORDER — METOPROLOL TARTRATE 25 MG PO TABS: 25.0000 mg | ORAL_TABLET | Freq: Three times a day (TID) | ORAL | Status: DC

## 2013-07-28 MED ORDER — LABETALOL HCL 5 MG/ML IV SOLN
10.0000 mg | INTRAVENOUS | Status: DC | PRN
  Administered 2013-07-28 (×2): 10 mg via INTRAVENOUS
  Filled 2013-07-28 (×2): qty 4

## 2013-07-28 MED ORDER — SODIUM CHLORIDE 0.9 % IV SOLN
Freq: Once | INTRAVENOUS | Status: AC
  Administered 2013-07-28: via INTRAVENOUS

## 2013-07-28 MED ORDER — AMIODARONE HCL 200 MG PO TABS
200.0000 mg | ORAL_TABLET | Freq: Every day | ORAL | Status: DC
  Administered 2013-07-29: 200 mg via ORAL
  Filled 2013-07-28 (×2): qty 1

## 2013-07-28 MED ORDER — ACETAMINOPHEN 650 MG RE SUPP: 650.0000 mg | Freq: Four times a day (QID) | RECTAL | Status: DC | PRN

## 2013-07-28 MED ORDER — ACETAMINOPHEN 325 MG PO TABS: 650.0000 mg | ORAL_TABLET | Freq: Four times a day (QID) | ORAL | Status: DC | PRN

## 2013-07-28 MED ORDER — ALUM & MAG HYDROXIDE-SIMETH 200-200-20 MG/5ML PO SUSP: 30.0000 mL | Freq: Four times a day (QID) | ORAL | Status: DC | PRN

## 2013-07-28 MED ORDER — METOPROLOL TARTRATE 1 MG/ML IV SOLN
INTRAVENOUS | Status: AC
  Filled 2013-07-28: qty 5

## 2013-07-28 MED ORDER — HYDROMORPHONE HCL PF 1 MG/ML IJ SOLN
0.5000 mg | INTRAMUSCULAR | Status: DC | PRN
  Administered 2013-07-30: 1 mg via INTRAVENOUS
  Filled 2013-07-28: qty 1

## 2013-07-28 MED ORDER — ONDANSETRON HCL 4 MG PO TABS: 4.0000 mg | ORAL_TABLET | Freq: Four times a day (QID) | ORAL | Status: DC | PRN

## 2013-07-28 MED ORDER — APIXABAN 5 MG PO TABS: 5.0000 mg | ORAL_TABLET | Freq: Two times a day (BID) | ORAL | Status: DC

## 2013-07-28 MED ORDER — PNEUMOCOCCAL VAC POLYVALENT 25 MCG/0.5ML IJ INJ
0.5000 mL | INJECTION | INTRAMUSCULAR | Status: DC
  Filled 2013-07-28 (×2): qty 0.5

## 2013-07-28 MED ORDER — AMIODARONE HCL IN DEXTROSE 360-4.14 MG/200ML-% IV SOLN
30.0000 mg/h | INTRAVENOUS | Status: DC
  Administered 2013-07-28 (×2): 30 mg/h via INTRAVENOUS
  Filled 2013-07-28 (×2): qty 200

## 2013-07-28 NOTE — ED Notes (Signed)
Pharmacy to send Amio drip to ED

## 2013-07-28 NOTE — Progress Notes (Signed)
Subjective:  Doing well denies any anginal chest pain complains of occasional pleuritic chest pain converted back into sinus rhythm. Discussed with patient briefly regarding flutter ablation wanted to be treated medically only.  Objective:  Vital Signs in the last 24 hours: Temp:  [97.4 F (36.3 C)-97.7 F (36.5 C)] 97.6 F (36.4 C) (06/28 0800) Pulse Rate:  [36-153] 88 (06/28 0948) Resp:  [13-31] 20 (06/28 0800) BP: (74-120)/(44-82) 120/73 mmHg (06/28 0948) SpO2:  [90 %-100 %] 97 % (06/28 0800) Weight:  [81.4 kg (179 lb 7.3 oz)-92.534 kg (204 lb)] 81.4 kg (179 lb 7.3 oz) (06/28 0300)  Intake/Output from previous day: 06/27 0701 - 06/28 0700 In: 2791.7 [I.V.:2291.7; IV Piggyback:500] Out: -  Intake/Output from this shift:    Physical Exam: Neck: no adenopathy, no carotid bruit, no JVD and supple, symmetrical, trachea midline Lungs: clear to auscultation bilaterally Heart: regular rate and rhythm, S1, S2 normal, no murmur, click, rub or gallop Abdomen: soft, non-tender; bowel sounds normal; no masses,  no organomegaly Extremities: No clubbing cyanosis trace edema noted  Lab Results:  Recent Labs  07/27/13 2157 07/28/13 0738  WBC 5.8 4.1  HGB 15.3 12.9*  PLT 199 164    Recent Labs  07/27/13 2157 07/28/13 0738  NA 141 143  K 3.9 4.1  CL 103 111  CO2 22 20  GLUCOSE 84 71  BUN 9 8  CREATININE 0.87 0.79    Recent Labs  07/28/13 0138 07/28/13 0738  TROPONINI <0.30 <0.30   Hepatic Function Panel No results found for this basename: PROT, ALBUMIN, AST, ALT, ALKPHOS, BILITOT, BILIDIR, IBILI,  in the last 72 hours No results found for this basename: CHOL,  in the last 72 hours No results found for this basename: PROTIME,  in the last 72 hours  Imaging: Imaging results have been reviewed and No results found.  Cardiac Studies:  Assessment/Plan:  Recurrent atrial flutter with RVR precipitated by alcohol binging  Hypertension  EtOH abuse  Tobacco abuse   Depression  Back pain/hip pain rule out radiculopathy  Plan Continue present management Okay to transfer to telemetry if remains in sinus rhythm and possible discharge home tomorrow if stable  LOS: 1 day    HARWANI,MOHAN N 07/28/2013, 10:13 AM

## 2013-07-28 NOTE — Progress Notes (Signed)
Left lower extremity venous duplex completed.  Left:  No evidence of DVT, superficial thrombosis, or Baker's cyst.  Right:  Negative for DVT in the common femoral vein.  

## 2013-07-28 NOTE — Progress Notes (Signed)
[facility] PROGRESS NOTE  Geralynn OchsJohn Berhane ZOX:[id number] DOB: 11/18/1965 DOA: 07/27/2013 PCP: No PCP Per Patient  Assessment/Plan: 1. Afib RVR 1. Cardiology consulted 2. Initially on IV amiodarone, since stopped overnight 3. Overnight HR staying mainly in the 50's with bouts of tachycardia to the low 100's 4. Electrolytes stable 5. Added beta blocker 6. Cont eliquis for secondary stroke prevention 2. Syncope 1. Possibly secondary to EtOH abuse vs presenting hypotension 3. Hypotension 1. Improved with IVF 4. Chest pain 1. Thus far, serial cardiac enzymes neg x 2 2. Cardiology following 5. ETOH abuse 1. On CIWA protocol 2. 15min Cessation done at the bedside 6. LLE pain 1. LE dopplers pending 7. DVT prophylaxis 1. Cont Eliquis  Code Status: Full Family Communication: Pt and fiance in room Disposition Plan: Pending   Consultants:  Cardiology - Dr. Sharyn LullHarwani  Procedures:    Antibiotics:   (indicate start date, and stop date if known)  HPI/Subjective: No acute events noted overnight  Objective: Filed Vitals:   07/28/13 0400 07/28/13 0430 07/28/13 0500 07/28/13 0600  BP: 93/63 83/57 91/60  99/66  Pulse: 63 116 58 50  Temp: 97.4 F (36.3 C)     TempSrc: Oral     Resp: 30 20 18 17   Height:      Weight:      SpO2: 90% 94% 95% 97%    Intake/Output Summary (Last 24 hours) at 07/28/13 0802 Last data filed at 07/28/13 0600  Gross per 24 hour  Intake 2791.67 ml  Output      0 ml  Net 2791.67 ml   Filed Weights   07/27/13 2126 07/28/13 0300  Weight: 204 lb (92.534 kg) 179 lb 7.3 oz (81.4 kg)    Exam:   General:  Awake, in nad  Cardiovascular: regular, s1, s2  Respiratory: normal resp effort, no wheezing  Abdomen: soft, nondistended  Musculoskeletal: perfused, no clubbing   Data Reviewed: Basic Metabolic Panel:  Recent Labs Lab 07/27/13 2157  NA 141  K 3.9  CL 103  CO2 22  GLUCOSE 84  BUN 9  CREATININE 0.87  CALCIUM 8.8   Liver  Function Tests: No results found for this basename: AST, ALT, ALKPHOS, BILITOT, PROT, ALBUMIN,  in the last 168 hours No results found for this basename: LIPASE, AMYLASE,  in the last 168 hours No results found for this basename: AMMONIA,  in the last 168 hours CBC:  Recent Labs Lab 07/27/13 2157  WBC 5.8  NEUTROABS 2.1  HGB 15.3  HCT 44.1  MCV 92.1  PLT 199   Cardiac Enzymes:  Recent Labs Lab 07/28/13 0017 07/28/13 0138  TROPONINI <0.30 <0.30   BNP (last 3 results)  Recent Labs  06/24/13 0544  PROBNP 935.8*   CBG: No results found for this basename: GLUCAP,  in the last 168 hours  Recent Results (from the past 240 hour(s))  MRSA PCR SCREENING     Status: None   Collection Time    07/28/13  2:59 AM      Result Value Ref Range Status   MRSA by PCR NEGATIVE  NEGATIVE Final   Comment:            The GeneXpert MRSA Assay (FDA     approved for NASAL specimens     only), is one component of a     comprehensive MRSA colonization     surveillance program. It is not     intended to diagnose MRSA     infection nor  to guide or     monitor treatment for     MRSA infections.     Studies: No results found.  Scheduled Meds: . sodium chloride  1,000 mL Intravenous Once  . [START ON 07/29/2013] amiodarone  200 mg Oral Daily  . apixaban  5 mg Oral BID  . folic acid  1 mg Oral Daily  . LORazepam  0-4 mg Intravenous Q6H   Followed by  . [START ON 07/30/2013] LORazepam  0-4 mg Intravenous Q12H  . metoprolol      . metoprolol tartrate  25 mg Oral BID  .  morphine injection  4 mg Intravenous Once  . multivitamin with minerals  1 tablet Oral Daily  . thiamine  100 mg Oral Daily   Or  . thiamine  100 mg Intravenous Daily   Continuous Infusions: . sodium chloride Stopped (07/28/13 0117)  . sodium chloride 100 mL/hr at 07/28/13 0305  . amiodarone      Principal Problem:   Atrial flutter with rapid ventricular response Active Problems:   Alcoholism /alcohol abuse    Chest pain   Syncope and collapse   Left leg pain   Hypotension  Time spent:  CHIU, STEPHEN K  [facility] Pager [phone]. If 7PM-7AM, please contact night-coverage at [email or web address], password [facility] 07/28/2013, 8:02 AM  LOS: 1 day

## 2013-07-28 NOTE — ED Notes (Signed)
Admitting MD at bedside MD updated r/t [facility]

## 2013-07-28 NOTE — Consult Note (Signed)
Reason for Consult recurrent atrial flutter with rapid ventricular response Referring Physician: [facility]  [name] is an 48 y.o. male.  HPI: Patient is 48 year old male with past medical history significant for hypertension, history of atrial flutter in recent past chemically cardioverted to sinus rhythm by IV amiodarone refuse for flutter ablation,: Abuse, tobacco abuse, depression, came to the ER complaining of back hip and leg pain for last few weeks and was noted to be in atrial flutter with RVR. On further questioning patient states he has been drinking since this afternoon and suddenly developed fluttering in the chest. Denies any syncopal episode. Denies PND orthopnea leg swelling. Denies chest pain or shortness of breath. Patient states he has been compliant with medication and wants off alcohol for approximately 4 months until today he restarted drinking. Denies any fever or chills.  Past Medical History  Diagnosis Date  . Hypertension   . Alcoholism /alcohol abuse   . Anxiety and depression   . Tobacco abuse   . Atrial fibrillation     Past Surgical History  Procedure Laterality Date  . Hernia repair      UHR x2    Family History  Problem Relation Age of Onset  . Heart attack Mother   . Heart attack Father   . Hypertension Mother   . Hypertension Father     Social History:  reports that he has been smoking Cigarettes.  He has a 30 pack-year smoking history. He has never used smokeless tobacco. He reports that he drinks alcohol. He reports that he does not use illicit drugs.  Allergies:  Allergies  Allergen Reactions  . Bee Venom Anaphylaxis    Uses epi pen-- last used 08/23/11    Medications: I have reviewed the patient's current medications.  Results for orders placed during the hospital encounter of 07/27/13 (from the past 48 hour(s))  CBC WITH DIFFERENTIAL     Status: Abnormal   Collection Time    07/27/13  9:57 PM      Result Value Ref Range    WBC 5.8  4.0 - 10.5 K/uL   RBC 4.79  4.22 - 5.81 MIL/uL   Hemoglobin 15.3  13.0 - 17.0 g/dL   HCT 44.1  39.0 - 52.0 %   MCV 92.1  78.0 - 100.0 fL   MCH 31.9  26.0 - 34.0 pg   MCHC 34.7  30.0 - 36.0 g/dL   RDW 12.9  11.5 - 15.5 %   Platelets 199  150 - 400 K/uL   Neutrophils Relative % 36 (*) 43 - 77 %   Neutro Abs 2.1  1.7 - 7.7 K/uL   Lymphocytes Relative 50 (*) 12 - 46 %   Lymphs Abs 2.9  0.7 - 4.0 K/uL   Monocytes Relative 10  3 - 12 %   Monocytes Absolute 0.6  0.1 - 1.0 K/uL   Eosinophils Relative 3  0 - 5 %   Eosinophils Absolute 0.2  0.0 - 0.7 K/uL   Basophils Relative 1  0 - 1 %   Basophils Absolute 0.0  0.0 - 0.1 K/uL  BASIC METABOLIC PANEL     Status: None   Collection Time    07/27/13  9:57 PM      Result Value Ref Range   Sodium 141  137 - 147 mEq/L   Potassium 3.9  3.7 - 5.3 mEq/L   Chloride 103  96 - 112 mEq/L   CO2 22  19 - 32 mEq/L  Glucose, Bld 84  70 - 99 mg/dL   BUN 9  6 - 23 mg/dL   Creatinine, Ser 0.87  0.50 - 1.35 mg/dL   Calcium 8.8  8.4 - 10.5 mg/dL   GFR calc non Af Amer >90  >90 mL/min   GFR calc Af Amer >90  >90 mL/min   Comment: (NOTE)     The eGFR has been calculated using the CKD EPI equation.     This calculation has not been validated in all clinical situations.     eGFR's persistently <90 mL/min signify possible Chronic Kidney     Disease.  ETHANOL     Status: Abnormal   Collection Time    07/27/13  9:57 PM      Result Value Ref Range   Alcohol, Ethyl (B) 217 (*) 0 - 11 mg/dL   Comment:            LOWEST DETECTABLE LIMIT FOR     SERUM ALCOHOL IS 11 mg/dL     FOR MEDICAL PURPOSES ONLY  TROPONIN I     Status: None   Collection Time    07/28/13 12:17 AM      Result Value Ref Range   Troponin I <0.30  <0.30 ng/mL   Comment:            Due to the release kinetics of cTnI,     a negative result within the first hours     of the onset of symptoms does not rule out     myocardial infarction with certainty.     If myocardial infarction  is still suspected,     repeat the test at appropriate intervals.    No results found.  Review of Systems  Constitutional: Negative for fever and chills.  HENT: Negative for hearing loss.   Eyes: Negative for double vision, photophobia and pain.  Respiratory: Negative for cough, hemoptysis and sputum production.   Cardiovascular: Positive for palpitations. Negative for chest pain, orthopnea, claudication and leg swelling.  Gastrointestinal: Negative for nausea, vomiting, abdominal pain and diarrhea.  Neurological: Negative for dizziness and headaches.   Blood pressure 101/82, pulse 139, temperature 97.7 F (36.5 C), temperature source Oral, resp. rate 31, height 5' 6"  (1.676 m), weight 92.534 kg (204 lb), SpO2 100.00%. Physical Exam  Constitutional: He is oriented to person, place, and time.  HENT:  Head: Normocephalic.  Left Ear: External ear normal.  Eyes: Conjunctivae are normal. Left eye exhibits no discharge.  Neck: Normal range of motion. Neck supple. No tracheal deviation present. No thyromegaly present.  Cardiovascular:  Tachycardic atrial flutter with 2 to one block on the monitor S1 and S2 soft  Respiratory: Effort normal and breath sounds normal. No respiratory distress. He has no wheezes. He has no rales.  GI: Soft. Bowel sounds are normal. He exhibits no distension. There is no tenderness. There is no rebound.  Musculoskeletal: He exhibits no edema and no tenderness.  Neurological: He is alert and oriented to person, place, and time.    Assessment/Plan: Recurrent atrial flutter with RVR precipitated by alcohol binging Hypertension EtOH abuse Tobacco abuse Depression Back pain/hip pain rule out radiculopathy Plan Continue Eliquis 5 mg twice daily Agree with IV amiodarone as per orders Add low-dose beta blockers as blood pressure tolerates Consider her smoking and alcohol cessation consult Check serial enzymes and EKG    HARWANI,MOHAN N 07/28/2013, 1:38 AM

## 2013-07-28 NOTE — H&P (Signed)
[facility] History and Physical  [name] ZOX:[id number] DOB: [birth year] DOA: 07/27/2013  Referring physician:  EDP PCP: No PCP Per Patient  Specialists:   Chief Complaint:  Passed Out  HPI: [name] is a 48 y.o. male with a history of Alcoholism, and Atrial Fibrillation/Flutter who presented to the ED after [place] the park [place]  7 pm.   He complains of havign chest pain and palpitations, the chest pain is in the left chest and radiates into his left arm.   He is on Amiodarone Rx and Eliquis at home and reports taking his medications regularly.   He drank 2 forty ounce beers today, and he reports that he regularly drinks up to 7 40 ounce beers daily.    He also has complaints of left leg painj and swelling x 1 month, he denies any injury to his leg,      Review of Systems:  Constitutional: No Weight Loss, No Weight Gain, Night Sweats, Fevers, Chills, Fatigue, or Generalized Weakness HEENT: No Headaches, Difficulty Swallowing,Tooth/Dental Problems,Sore Throat,  No Sneezing, Rhinitis, Ear Ache, Nasal Congestion, or Post Nasal Drip,  Cardio-vascular:  +Chest pain, Orthopnea, PND, +Edema in lower extremities, Anasarca, Dizziness, +Palpitations  Resp: +Dyspnea, No DOE, No Cough, No Hemoptysis, No Wheezing.    GI: No Heartburn, Indigestion, Abdominal Pain, Nausea, Vomiting, Diarrhea, Change in Bowel Habits,  Loss of Appetite  GU: No Dysuria, Change in Color of Urine, No Urgency or Frequency.  No Flank pain.  Musculoskeletal:  + Left Leg Pain,  And Swelling,  No Decreased Range of Motion. No Back Pain.  Neurologic: No Syncope, No Seizures, Muscle Weakness, Paresthesia, Vision Disturbance or Loss, No Diplopia, No Vertigo, No Difficulty Walking,  Skin: No Rash or Lesions. Psych: No Change in Mood or Affect. No Depression or Anxiety. No Memory loss. No Confusion or Hallucinations   Past Medical History  Diagnosis Date  . Hypertension   . Alcoholism /alcohol abuse   .  Anxiety and depression   . Tobacco abuse   . Atrial fibrillation       Past Surgical History  Procedure Laterality Date  . Hernia repair      UHR x2       Prior to Admission medications   Medication Sig Start Date End Date Taking? Authorizing Provider  amiodarone (PACERONE) 200 MG tablet Take 1 tablet (200 mg total) by mouth daily. 06/27/13  Yes Jerald Kief, MD  apixaban (ELIQUIS) 5 MG TABS tablet Take 1 tablet (5 mg total) by mouth 2 (two) times daily. 06/27/13  Yes Jerald Kief, MD  aspirin 325 MG tablet Take 650 mg by mouth every 6 (six) hours as needed (pain).   Yes Historical Provider, MD  metoprolol tartrate (LOPRESSOR) 25 MG tablet Take 1 tablet (25 mg total) by mouth 3 (three) times daily. 06/26/13  Yes Jerald Kief, MD      Allergies  Allergen Reactions  . Bee Venom Anaphylaxis    Uses epi pen-- last used 08/23/11     Social History:  reports that he has been smoking Cigarettes.  He has a 30 pack-year smoking history. He has never used smokeless tobacco. He reports that he drinks alcohol. He reports that he does not use illicit drugs.     Family History  Problem Relation Age of Onset  . Heart attack Mother   . Heart attack Father   . Hypertension Mother   . Hypertension Father  Physical Exam:  GEN:   Agitated and Disheveled, Older than Stated Age Thin  48 y.o. Caucasian male  examined and in no acute distress; Filed Vitals:   07/28/13 0030 07/28/13 0100 07/28/13 0116 07/28/13 0118  BP: 84/69 77/44 95/72  95/72  Pulse: 36 143 147 146  Temp:      TempSrc:      Resp: 18 21 17 24   Height:      Weight:      SpO2: 95% 98% 100% 100%   Blood pressure 95/72, pulse 146, temperature 97.7 F (36.5 C), temperature source Oral, resp. rate 24, height 5\' 6"  (1.676 m), weight 92.534 kg (204 lb), SpO2 100.00%. PSYCH: He is alert and oriented x4; does not appear anxious does not appear depressed; affect is normal HEENT: Normocephalic and Atraumatic, Mucous  membranes pink; PERRLA; EOM intact; Fundi:  Benign;  No scleral icterus, Nares: Patent, Oropharynx: Clear, Edentulous or Fair Dentition, Neck:  FROM, no cervical lymphadenopathy nor thyromegaly or carotid bruit; no JVD; Breasts:: Not examined CHEST WALL: No tenderness CHEST: Normal respiration, clear to auscultation bilaterally HEART: Regular rate and rhythm; no murmurs rubs or gallops BACK: No kyphosis or scoliosis; no CVA tenderness ABDOMEN: Positive Bowel Sounds, Scaphoid, soft non-tender; no masses, no organomegaly. Rectal Exam: Not done EXTREMITIES: No cyanosis, clubbing,  1+EDEMA to LLE; no ulcerations. Genitalia: not examined PULSES: 2+ and symmetric SKIN: Normal hydration no rash or ulceration CNS:  Alert and Oriented x 4, No Focal Deficits.     Vascular: pulses palpable throughout    Labs on Admission:  Basic Metabolic Panel:  Recent Labs Lab 07/27/13 2157  NA 141  K 3.9  CL 103  CO2 22  GLUCOSE 84  BUN 9  CREATININE 0.87  CALCIUM 8.8   Liver Function Tests: No results found for this basename: AST, ALT, ALKPHOS, BILITOT, PROT, ALBUMIN,  in the last 168 hours No results found for this basename: LIPASE, AMYLASE,  in the last 168 hours No results found for this basename: AMMONIA,  in the last 168 hours CBC:  Recent Labs Lab 07/27/13 2157  WBC 5.8  NEUTROABS 2.1  HGB 15.3  HCT 44.1  MCV 92.1  PLT 199   Cardiac Enzymes:  Recent Labs Lab 07/28/13 0017  TROPONINI <0.30    BNP (last 3 results)  Recent Labs  06/24/13 0544  PROBNP 935.8*   CBG: No results found for this basename: GLUCAP,  in the last 168 hours  Radiological Exams on Admission: No results found.    EKG: Independently reviewed. Atrial flutter with RVR at 122.      Assessment/Plan:   48 y.o. male with  Principal Problem:   Atrial flutter with rapid ventricular response Active Problems:   Chest pain   Syncope and collapse   Hypotension   Alcoholism /alcohol abuse   Left leg  pain    1.   Atrial Flutter with RVR-   Placed on IV Amiodarone drip.  Cards: Dr Sharyn LullHarwani  to see in AM  2.   Syncope and Collapse-  Multifactorial- due to #1, or Due to Alcohol Intoxication, or due to Orthostasis or due to all 3.    3.   Hypotension-  IVFs for rehydration.     4.   Chest Pain-  Cardiac monitoring and check Tropnins.    5.   Alcoholism/ Alcohol Abuse-   CIWA Protocol with IV Ativan.     6.   Left Leg pain-  Venous duplex US ordered to  evaluate for DVT.    7.   Other- On Eliquis Rx.       Code Status:   FULL CODE Family Communication:    Fiance at Bedside Disposition Plan:    Inpatient to ICU   Time spent:  76 Minutes  Ron Parker [facility] Pager [phone]  If 7PM-7AM, please contact night-coverage [email or web address] Password [facility] 07/28/2013, 1:37 AM

## 2013-07-28 NOTE — Progress Notes (Signed)
Notified Dr. Sharyn Lull of patient's elevated heart rate of 145 andnew orders received will continue to monitor.

## 2013-07-28 NOTE — ED Provider Notes (Addendum)
CSN: [id number]     Arrival date & time 07/27/13  2032 History   First MD Initiated Contact with Patient 07/27/13 2140     Chief Complaint  Patient presents with  . Suicidal  . Leg Pain    left      The history is provided by the patient.   Patient presents to the emergency department with complaints of left buttock and left leg pain.  He states this is a chronic issue for him as his be hurting more over the past several days.  He denies fevers and chills.  No abdominal pain.  He has some pain in the left low back as well.  Denies IV drug abuse.  He reports a long-standing history of alcohol abuse.  He's been sober for the past 4 months he reports binge drinking on 2 x 40 ounce beers tonight.  He denies homicidal and suicidal thoughts for me.  He does report some SI thoughts to nursing staff on arrival to the ER.  He now states this is not true.  He denies eating much today he denies drinking water.  Denies palpitations.  No chest pain or shortness of breath.  No vomiting.  Patient has a history of paroxysmal atrial fibrillation and is on amiodarone and apixaban.  Family reports compliance with this.     Past Medical History  Diagnosis Date  . Hypertension   . Alcoholism /alcohol abuse   . Anxiety and depression   . Tobacco abuse   . Atrial fibrillation    Past Surgical History  Procedure Laterality Date  . Hernia repair      UHR x2   Family History  Problem Relation Age of Onset  . Heart attack Mother   . Heart attack Father   . Hypertension Mother   . Hypertension Father    History  Substance Use Topics  . Smoking status: Current Every Day Smoker -- 1.00 packs/day for 30 years    Types: Cigarettes  . Smokeless tobacco: Never Used  . Alcohol Use: Yes     Comment: Drinks every day, 9-11 40 oz beers.     Review of Systems  All other systems reviewed and are negative.     Allergies  Bee venom  Home Medications   Prior to Admission medications   Medication Sig  Start Date End Date Taking? Authorizing Provider  amiodarone (PACERONE) 200 MG tablet Take 1 tablet (200 mg total) by mouth daily. 06/27/13  Yes Jerald KiefStephen K Chiu, MD  apixaban (ELIQUIS) 5 MG TABS tablet Take 1 tablet (5 mg total) by mouth 2 (two) times daily. 06/27/13  Yes Jerald KiefStephen K Chiu, MD  aspirin 325 MG tablet Take 650 mg by mouth every 6 (six) hours as needed (pain).   Yes Historical Provider, MD  metoprolol tartrate (LOPRESSOR) 25 MG tablet Take 1 tablet (25 mg total) by mouth 3 (three) times daily. 06/26/13  Yes Jerald KiefStephen K Chiu, MD   BP [phone]  Pulse 141  Temp(Src) 97.7 F (36.5 C) (Oral)  Resp 22  Ht 5\' 6"  (1.676 m)  Wt 204 lb (92.534 kg)  BMI 32.94 kg/m2  SpO2 93% Physical Exam  Nursing note and vitals reviewed. Constitutional: He is oriented to person, place, and time. He appears well-developed and well-nourished.  HENT:  Head: Normocephalic and atraumatic.  Eyes: EOM are normal.  Neck: Normal range of motion.  Cardiovascular: Regular rhythm, normal heart sounds and intact distal pulses.   Tachycardic  Pulmonary/Chest: Effort normal and  breath sounds normal. No respiratory distress.  Abdominal: Soft. He exhibits no distension. There is no tenderness.  Musculoskeletal: Normal range of motion.  Full range of motion of left hip, left knee, left ankle.  Mild pain with palpation in the left sciatic groove.  No L-spine tenderness.  Normal PT and DP pulse in left foot.  No unilateral leg swelling as compared to the other.  No erythema or warmth or focal tenderness of the left lower extremity.  No signs of trauma or bruising.  Neurological: He is alert and oriented to person, place, and time.  Skin: Skin is warm and dry.  Psychiatric: He has a normal mood and affect. Judgment normal.    ED Course  Procedures (including critical care time) Labs Review Labs Reviewed  CBC WITH DIFFERENTIAL - Abnormal; Notable for the following:    Neutrophils Relative % 36 (*)    Lymphocytes Relative  50 (*)    All other components within normal limits  ETHANOL - Abnormal; Notable for the following:    Alcohol, Ethyl (B) 217 (*)    All other components within normal limits  BASIC METABOLIC PANEL  TROPONIN I    Imaging Review No results found.   EKG Interpretation   Date/Time:  Saturday July 27 2013 21:55:46 EDT Ventricular Rate:  145 PR Interval:  123 QRS Duration: 106 QT Interval:  287 QTC Calculation: 446 R Axis:   33 Text Interpretation:  Sinus tachycardia RSR' in V1 or V2, probably normal  variant nonspecific ST changes No significant change was found except for  tachycardia Reconfirmed by CAMPOS  MD, Caryn Bee ([id number]) on 07/28/2013 12:09:37  AM      ECG interpretation  Date: 07/28/2013  Rate: 122  Rhythm: atrial flutter  QRS Axis: normal  Intervals: normal  ST/T Wave abnormalities: normal  Conduction Disutrbances: none  Narrative Interpretation:   Old EKG Reviewed: changed from prior ecg, flutter waves now present. No significant changes noted     MDM   Final diagnoses:  Atrial flutter with rapid ventricular response  Alcohol intoxication, with unspecified complication    Patient presents with symptoms consistent with left-sided sciatica.  His heart rate was noted to be in the 140s on arrival with a soft blood pressure.  As of this is thought to be secondary to volume depletion.  I began hydrating the patient.  Improvement in his heart rate but now appears to be in atrial flutter with a rate of 122.  I suspect the initial EKG which appeared to be sinus rhythm at 145 was more flutter with 2:1 block without obvious signs of flutter waves.  Doubt acute coronary syndrome.  No chest pain or shortness of breath.  It unclear how long he's been in this rhythm.  He does have a prior history of atrial fibrillation.  He is supposed to be taking apixaban and amiodarone and family states she's compliant with this.  Given his soft blood pressure 94 systolic I will initiate  patient on amiodarone and admitted to the hospital for medical conversion to sinus rhythm.  I will speak with his cardiology team or on call for his cardiologist. (primary cardiologist Dr Sharyn Lull)  12:50 AM Spoke with Dr Sharyn Lull, agrees with amiodarone. Requests hospitalist admission. He will see in the AM  Lyanne Co, MD 07/28/13 0022  Lyanne Co, MD 07/28/13 [phone]

## 2013-07-29 DIAGNOSIS — J9600 Acute respiratory failure, unspecified whether with hypoxia or hypercapnia: Secondary | ICD-10-CM

## 2013-07-29 LAB — COMPREHENSIVE METABOLIC PANEL
ALBUMIN: 2.6 g/dL — AB (ref 3.5–5.2)
ALK PHOS: 51 U/L (ref 39–117)
ALT: 22 U/L (ref 0–53)
AST: 14 U/L (ref 0–37)
BUN: 14 mg/dL (ref 6–23)
CO2: 24 mEq/L (ref 19–32)
Calcium: 8.2 mg/dL — ABNORMAL LOW (ref 8.4–10.5)
Chloride: 108 mEq/L (ref 96–112)
Creatinine, Ser: 0.94 mg/dL (ref 0.50–1.35)
GFR calc Af Amer: >90 mL/min (ref >90)
GFR calc non Af Amer: >90 mL/min (ref >90)
Glucose, Bld: 89 mg/dL (ref 70–99)
Potassium: 4.3 mEq/L (ref 3.7–5.3)
Sodium: 140 mEq/L (ref 137–147)
TOTAL PROTEIN: 4.9 g/dL — AB (ref 6.0–8.3)
Total Bilirubin: 0.2 mg/dL — ABNORMAL LOW (ref 0.3–1.2)

## 2013-07-29 LAB — CALCIUM, IONIZED: Calcium, Ion: 1.25 mmol/L — ABNORMAL HIGH (ref 1.12–1.23)

## 2013-07-29 LAB — MAGNESIUM: Magnesium: 1.6 mg/dL (ref 1.5–2.5)

## 2013-07-29 MED ORDER — AMIODARONE HCL 200 MG PO TABS
200.0000 mg | ORAL_TABLET | Freq: Two times a day (BID) | ORAL | Status: DC
  Filled 2013-07-29 (×3): qty 1

## 2013-07-29 MED ORDER — MAGNESIUM SULFATE 40 MG/ML IJ SOLN
2.0000 g | Freq: Once | INTRAMUSCULAR | Status: AC
  Administered 2013-07-29: 2 g via INTRAVENOUS
  Filled 2013-07-29: qty 50

## 2013-07-29 NOTE — Progress Notes (Signed)
Subjective:  Patient denies any chest pain or shortness of breath. Went back into atrial flutter with rapid ventricular response yesterday requiring multiple boluses of IV Lopressor and started on Cardizem drip with conversion to sinus rhythm. Discussed again with patient regarding radiofrequency ablation and wanted to be treated medically only Complains of left hip and leg pain. Objective:  Vital Signs in the last 24 hours: Temp:  [97.7 F (36.5 C)-98.7 F (37.1 C)] 98.1 F (36.7 C) (06/29 0800) Pulse Rate:  [36-145] 70 (06/29 1200) Resp:  [14-51] 19 (06/29 1200) BP: (96-135)/(55-106) 105/55 mmHg (06/29 1200) SpO2:  [95 %-99 %] 98 % (06/29 1200) Weight:  [86.5 kg (190 lb 11.2 oz)] 86.5 kg (190 lb 11.2 oz) (06/29 0400)  Intake/Output from previous day: 06/28 0701 - 06/29 0700 In: 2416.4 [P.O.:222; I.V.:2194.4] Out: 1775 [Urine:1775] Intake/Output from this shift: Total I/O In: 100 [I.V.:100] Out: 200 [Urine:200]  Physical Exam: Neck: no adenopathy, no carotid bruit, no JVD and supple, symmetrical, trachea midline Lungs: clear to auscultation bilaterally Heart: regular rate and rhythm, S1, S2 normal, no murmur, click, rub or gallop Abdomen: soft, non-tender; bowel sounds normal; no masses,  no organomegaly Extremities: extremities normal, atraumatic, no cyanosis or edema  Lab Results:  Recent Labs  07/27/13 2157 07/28/13 0738  WBC 5.8 4.1  HGB 15.3 12.9*  PLT 199 164    Recent Labs  07/28/13 0738 07/29/13 0420  NA 143 140  K 4.1 4.3  CL 111 108  CO2 20 24  GLUCOSE 71 89  BUN 8 14  CREATININE 0.79 0.94    Recent Labs  07/28/13 0738 07/28/13 1333  TROPONINI <0.30 <0.30   Hepatic Function Panel  Recent Labs  07/29/13 0420  PROT 4.9*  ALBUMIN 2.6*  AST 14  ALT 22  ALKPHOS 51  BILITOT 0.2*   No results found for this basename: CHOL,  in the last 72 hours No results found for this basename: PROTIME,  in the last 72 hours  Imaging: Imaging results  have been reviewed and No results found.  Cardiac Studies:  Assessment/Plan:  Status post Recurrent atrial flutter with RVR now back in sinus rhythm Hypertension  EtOH abuse  Tobacco abuse  Depression  Back pain/hip pain rule out radiculopathy  Plan Continue present management Increase amiodarone to 200 mg twice daily for one week and then will switch to 200 mg daily if stable  LOS: 2 days    HARWANI,MOHAN N 07/29/2013, 12:16 PM

## 2013-07-29 NOTE — Progress Notes (Signed)
HR continues to drop 39-mid 40's. Dr.  Odor, MD ok to turn off Amiodarone drip. Pt is asymptomatic. Will continue to monitor pt.

## 2013-07-29 NOTE — Progress Notes (Signed)
[facility] Floor Coverage  Patient converted to NSR but remained bradycardic through the evening-rates <40. Discontinued Amiodarone infusion, diltiazem infusion off, oral rate control medications have hold parameters. Added on ionized calcium, mag and AM CMET. Patient asymptomatic and otherwise stable.  Anderson Malta, DO Internal Medicine

## 2013-07-29 NOTE — Progress Notes (Signed)
Pt has converted to Sinus Brady HR in the 40's. Rate and Rhythm confirmed by 12 lead EKG. MD on call made aware of pt's condition and current drip of amiodarone. MD wants to continue amiodarone gtt at this time as pt is asymptomatic and HR elevates immediately when pt is awoken. Will continue to monitor.

## 2013-07-29 NOTE — Progress Notes (Signed)
[facility] PROGRESS NOTE  Robert OchsJohn [name] UJW:[id number] DOB: 10/23/1965 DOA: 07/27/2013 PCP: No PCP Per Patient  Assessment/Plan: 1. Afib RVR 1. Cardiology consulted and following 2. Initially on IV amiodarone, since stopped overnight secondary to bradycardia 3. Digoxin was also stopped 4. Mg 1.6 at last check - will give 2gm IV x1 5. [name] normal 6. Added beta blocker 7. Cont eliquis for secondary stroke prevention 2. Syncope 1. Possibly secondary to EtOH abuse vs hypotension 3. Hypotension 1. Improved with IVF 4. Chest pain 1. Thus far, serial cardiac enzymes neg x 4 2. Cardiology following 5. ETOH abuse 1. On CIWA protocol 2. 15min Cessation done at the bedside 6. LLE pain 1. LE dopplers pending 7. DVT prophylaxis 1. Cont Eliquis  Code Status: Full Family Communication: Pt in room Disposition Plan: Pending   Consultants:  Cardiology - Dr. Sharyn LullHarwani  Procedures:    Antibiotics:   (indicate start date, and stop date if known)  HPI/Subjective: Noted to be bradycardic with HR into the 30's. Amiodarone and digoxin were stopped.  Objective: Filed Vitals:   07/28/13 2105 07/28/13 2255 07/29/13 0000 07/29/13 0400  BP: 103/77 104/70 104/71   Pulse: 60 46 49   Temp:   98.6 F (37 C) 97.7 F (36.5 C)  TempSrc:   Oral Oral  Resp: 36 22 28   Height:      Weight:    190 lb 11.2 oz (86.5 kg)  SpO2: 98% 97% 97%     Intake/Output Summary (Last 24 hours) at 07/29/13 0738 Last data filed at 07/29/13 0430  Gross per 24 hour  Intake 2416.36 ml  Output   1775 ml  Net 641.36 ml   Filed Weights   07/27/13 2126 07/28/13 0300 07/29/13 0400  Weight: 204 lb (92.534 kg) 179 lb 7.3 oz (81.4 kg) 190 lb 11.2 oz (86.5 kg)    Exam:   General:  Awake, in nad  Cardiovascular: regular, s1, s2  Respiratory: normal resp effort, no wheezing  Abdomen: soft, nondistended  Musculoskeletal: perfused, no clubbing   Data Reviewed: Basic Metabolic Panel:  Recent  Labs Lab 07/27/13 2157 07/28/13 0738 07/29/13 0420  NA 141 143 140  [name] 3.9 4.1 4.3  CL 103 111 108  CO2 22 20 24   GLUCOSE 84 71 89  BUN 9 8 14   CREATININE 0.87 0.79 0.94  CALCIUM 8.8 7.5* 8.2*  MG  --   --  1.6   Liver Function Tests:  Recent Labs Lab 07/29/13 0420  AST 14  ALT 22  ALKPHOS 51  BILITOT 0.2*  PROT 4.9*  ALBUMIN 2.6*   No results found for this basename: LIPASE, AMYLASE,  in the last 168 hours No results found for this basename: AMMONIA,  in the last 168 hours CBC:  Recent Labs Lab 07/27/13 2157 07/28/13 0738  WBC 5.8 4.1  NEUTROABS 2.1  --   HGB 15.3 12.9*  HCT 44.1 37.7*  MCV 92.1 93.8  PLT 199 164   Cardiac Enzymes:  Recent Labs Lab 07/28/13 0017 07/28/13 0138 07/28/13 0738 07/28/13 1333  TROPONINI <0.30 <0.30 <0.30 <0.30   BNP (last 3 results)  Recent Labs  06/24/13 0544  PROBNP 935.8*   CBG: No results found for this basename: GLUCAP,  in the last 168 hours  Recent Results (from the past 240 hour(s))  MRSA PCR SCREENING     Status: None   Collection Time    07/28/13  2:59 AM      Result Value Ref  Range Status   MRSA by PCR NEGATIVE  NEGATIVE Final   Comment:            The GeneXpert MRSA Assay (FDA     approved for NASAL specimens     only), is one component of a     comprehensive MRSA colonization     surveillance program. It is not     intended to diagnose MRSA     infection nor to guide or     monitor treatment for     MRSA infections.     Studies: No results found.  Scheduled Meds: . sodium chloride  1,000 mL Intravenous Once  . amiodarone  200 mg Oral Daily  . apixaban  5 mg Oral BID  . folic acid  1 mg Oral Daily  . LORazepam  0-4 mg Intravenous Q6H   Followed by  . [START ON 07/30/2013] LORazepam  0-4 mg Intravenous Q12H  . metoprolol tartrate  25 mg Oral BID  .  morphine injection  4 mg Intravenous Once  . multivitamin with minerals  1 tablet Oral Daily  . pneumococcal 23 valent vaccine  0.5 mL  Intramuscular Tomorrow-1000  . thiamine  100 mg Oral Daily   Or  . thiamine  100 mg Intravenous Daily   Continuous Infusions: . sodium chloride Stopped (07/28/13 0117)  . sodium chloride 100 mL/hr at 07/29/13 0717  . diltiazem (CARDIZEM) infusion Stopped (07/28/13 2122)    Principal Problem:   Atrial flutter with rapid ventricular response Active Problems:   Alcoholism /alcohol abuse   Chest pain   Syncope and collapse   Left leg pain   Hypotension  Time spent:  [name]  [facility] Pager [phone]. If 7PM-7AM, please contact night-coverage at [email or web address], password [email or web address] 07/29/2013, 7:38 AM  LOS: 2 days

## 2013-07-30 DIAGNOSIS — F341 Dysthymic disorder: Secondary | ICD-10-CM

## 2013-07-30 MED ORDER — METOPROLOL TARTRATE 12.5 MG HALF TABLET
12.5000 mg | ORAL_TABLET | Freq: Two times a day (BID) | ORAL | Status: DC
  Filled 2013-07-30 (×3): qty 1

## 2013-07-30 NOTE — Progress Notes (Signed)
Chaplain saw pt while rounding on ICU.  Familiar with pt from previous admissions.    [name]'s 48 year old daughter died in 06/09/13.  She was struck by a car while riding her bicycle.  [name] was hospitalized shortly afterward and this chaplain provided support around ETOH, grief.  Chaplain provided follow up support around grief and loss. [name] was somewhat evasive in exploring how he is coping.  Stated that he is taking it one day at a time.  Stated that he did not attend [facility], as was plan after his last admission to [facility].  Reported that [facility] told him it had not been a year since his last admission.  [name] requested prayers.  Chaplain prayed with pt and provided continuity of care around loss.    Will continue to follow during admission.   Burnis Kingfisher [place] [facility]  [phone]

## 2013-07-30 NOTE — Progress Notes (Signed)
Subjective:  Seen earlier this afternoon.  Doing well.  Denies any chest pain or shortness of breath.  Denies further palpitations or atrial flutter. Occasional episodes of marked sinus bradycardia earlier today.  Objective:  Vital Signs in the last 24 hours: Temp:  [97.7 F (36.5 C)-98.6 F (37 C)] 98.2 F (36.8 C) (06/30 1614) Pulse Rate:  [37-51] 47 (06/30 1614) Resp:  [15-36] 18 (06/30 1614) BP: (109-168)/(61-87) 168/85 mmHg (06/30 1614) SpO2:  [94 %-100 %] 100 % (06/30 1614) Weight:  [88.4 kg (194 lb 14.2 oz)] 88.4 kg (194 lb 14.2 oz) (06/30 0648)  Intake/Output from previous day: 06/29 0701 - 06/30 0700 In: 2300 [I.V.:2300] Out: 1150 [Urine:1150] Intake/Output from this shift:    Physical Exam: Neck: no adenopathy, no carotid bruit, no JVD and supple, symmetrical, trachea midline Lungs: clear to auscultation bilaterally Heart: regular rate and rhythm, S1, S2 normal, no murmur, click, rub or gallop Abdomen: soft, non-tender; bowel sounds normal; no masses,  no organomegaly Extremities: extremities normal, atraumatic, no cyanosis or edema  Lab Results:  Recent Labs  07/27/13 2157 07/28/13 0738  WBC 5.8 4.1  HGB 15.3 12.9*  PLT 199 164    Recent Labs  07/28/13 0738 07/29/13 0420  NA 143 140  K 4.1 4.3  CL 111 108  CO2 20 24  GLUCOSE 71 89  BUN 8 14  CREATININE 0.79 0.94    Recent Labs  07/28/13 0738 07/28/13 1333  TROPONINI <0.30 <0.30   Hepatic Function Panel  Recent Labs  07/29/13 0420  PROT 4.9*  ALBUMIN 2.6*  AST 14  ALT 22  ALKPHOS 51  BILITOT 0.2*   No results found for this basename: CHOL,  in the last 72 hours No results found for this basename: PROTIME,  in the last 72 hours  Imaging: Imaging results have been reviewed and No results found.  Cardiac Studies:  Assessment/Plan:  Status post Recurrent atrial flutter with RVR now back in sinus rhythm  Hypertension  EtOH abuse  Tobacco abuse  Depression  Back pain/hip pain  rule out radiculopathy  Plan Reduce Lopressor as per orders Okay to transfer to telemetry I will sign off please call if needed Okay to discharge from cardiac point of view in a.m. If stable  LOS: 3 days    HARWANI,MOHAN N 07/30/2013, 7:24 PM

## 2013-07-30 NOTE — Progress Notes (Signed)
[facility] PROGRESS NOTE  [name] [id number]:[id number] DOB: Oct 14, 1965 DOA: 07/27/2013 PCP: No PCP Per Patient  Assessment/Plan: 1. Afib RVR 1. Cardiology consulted and following, assisting with medication changes 2. Currently on Po amiodarone, 25mg  metoprolol, and cardizem gtt 3. Digoxin was recently stopped 4. HR overnight appears to be in the 30-40's 5. Cont eliquis for secondary stroke prevention 6. Cont to monitor lytes and correct as needed 2. Syncope 1. Possibly secondary to EtOH abuse vs hypotension 3. Hypotension 1. Improved with IVF 4. Chest pain 1. Thus far, serial cardiac enzymes neg x 4 2. Cardiology following 5. ETOH abuse 1. On CIWA protocol 2. Cessation done was at the bedside 6. LLE pain 1. LE dopplers neg for DVT 7. DVT prophylaxis 1. Cont Eliquis  Code Status: Full Family Communication: Pt in room Disposition Plan: Pending   Consultants:  Cardiology - Dr. Sharyn Lull  Procedures:    Antibiotics:   (indicate start date, and stop date if known)  HPI/Subjective: Feels better today. Noted to be bradycardic overnight while asleep  Objective: Filed Vitals:   07/30/13 0224 07/30/13 0400 07/30/13 0600 07/30/13 0648  BP: 145/80 132/76 142/74   Pulse: 41 39 39   Temp:  97.7 F (36.5 C)    TempSrc:  Oral    Resp: 15 17 15    Height:      Weight:    194 lb 14.2 oz (88.4 kg)  SpO2: 98% 100% 100%     Intake/Output Summary (Last 24 hours) at 07/30/13 0801 Last data filed at 07/30/13 0600  Gross per 24 hour  Intake   2100 ml  Output   1150 ml  Net    950 ml   Filed Weights   07/28/13 0300 07/29/13 0400 07/30/13 0648  Weight: 179 lb 7.3 oz (81.4 kg) 190 lb 11.2 oz (86.5 kg) 194 lb 14.2 oz (88.4 kg)    Exam:   General:  Awake, in nad  Cardiovascular: regular, s1, s2  Respiratory: normal resp effort, no wheezing  Abdomen: soft, nondistended  Musculoskeletal: perfused, no clubbing   Data Reviewed: Basic Metabolic  Panel:  Recent Labs Lab 07/27/13 2157 07/28/13 0738 07/29/13 0420  NA 141 143 140  K 3.9 4.1 4.3  CL 103 111 108  CO2 22 20 24   GLUCOSE 84 71 89  BUN 9 8 14   CREATININE 0.87 0.79 0.94  CALCIUM 8.8 7.5* 8.2*  MG  --   --  1.6   Liver Function Tests:  Recent Labs Lab 07/29/13 0420  AST 14  ALT 22  ALKPHOS 51  BILITOT 0.2*  PROT 4.9*  ALBUMIN 2.6*   No results found for this basename: LIPASE, AMYLASE,  in the last 168 hours No results found for this basename: AMMONIA,  in the last 168 hours CBC:  Recent Labs Lab 07/27/13 2157 07/28/13 0738  WBC 5.8 4.1  NEUTROABS 2.1  --   HGB 15.3 12.9*  HCT 44.1 37.7*  MCV 92.1 93.8  PLT 199 164   Cardiac Enzymes:  Recent Labs Lab 07/28/13 0017 07/28/13 0138 07/28/13 0738 07/28/13 1333  TROPONINI <0.30 <0.30 <0.30 <0.30   BNP (last 3 results)  Recent Labs  06/24/13 0544  PROBNP 935.8*   CBG: No results found for this basename: GLUCAP,  in the last 168 hours  Recent Results (from the past 240 hour(s))  MRSA PCR SCREENING     Status: None   Collection Time    07/28/13  2:59 AM  Result Value Ref Range Status   MRSA by PCR NEGATIVE  NEGATIVE Final   Comment:            The GeneXpert MRSA Assay (FDA     approved for NASAL specimens     only), is one component of a     comprehensive MRSA colonization     surveillance program. It is not     intended to diagnose MRSA     infection nor to guide or     monitor treatment for     MRSA infections.     Studies: No results found.  Scheduled Meds: . sodium chloride  1,000 mL Intravenous Once  . amiodarone  200 mg Oral BID  . apixaban  5 mg Oral BID  . folic acid  1 mg Oral Daily  . LORazepam  0-4 mg Intravenous Q12H  . metoprolol tartrate  25 mg Oral BID  .  morphine injection  4 mg Intravenous Once  . multivitamin with minerals  1 tablet Oral Daily  . pneumococcal 23 valent vaccine  0.5 mL Intramuscular Tomorrow-1000  . thiamine  100 mg Oral Daily    Or  . thiamine  100 mg Intravenous Daily   Continuous Infusions: . sodium chloride Stopped (07/28/13 0117)  . sodium chloride 100 mL/hr at 07/30/13 0312  . diltiazem (CARDIZEM) infusion Stopped (07/28/13 2122)    Principal Problem:   Atrial flutter with rapid ventricular response Active Problems:   Alcoholism /alcohol abuse   Chest pain   Syncope and collapse   Left leg pain   Hypotension  Time spent: 35min  CHIU, STEPHEN K  [facility] Pager [phone]. If 7PM-7AM, please contact night-coverage at [email or web address], password [email or web address] 07/30/2013, 8:01 AM  LOS: 3 days

## 2013-07-30 NOTE — Progress Notes (Signed)
Patient received from [name] in ICU. Agree with assessment and plan of care.

## 2013-07-31 MED ORDER — METOPROLOL TARTRATE 12.5 MG HALF TABLET: 12.5000 mg | ORAL_TABLET | Freq: Two times a day (BID) | ORAL | Status: DC

## 2013-07-31 MED ORDER — AMIODARONE HCL 200 MG PO TABS: 200.0000 mg | ORAL_TABLET | Freq: Two times a day (BID) | ORAL | Status: DC

## 2013-07-31 MED ORDER — ADULT MULTIVITAMIN W/MINERALS CH: 1.0000 | ORAL_TABLET | Freq: Every day | ORAL | Status: DC

## 2013-07-31 MED ORDER — THIAMINE HCL 100 MG PO TABS: 100.0000 mg | ORAL_TABLET | Freq: Every day | ORAL | Status: DC

## 2013-07-31 MED ORDER — FOLIC ACID 1 MG PO TABS: 1.0000 mg | ORAL_TABLET | Freq: Every day | ORAL | Status: DC

## 2013-07-31 NOTE — Progress Notes (Signed)
Follow up support around discharge.

## 2013-07-31 NOTE — Discharge Summary (Signed)
Physician Discharge Summary  [name] ZOX:[id number] DOB: [birth year] DOA: 07/27/2013  PCP: No PCP Per Patient  Admit date: 07/27/2013 Discharge date: 07/31/2013  Time spent: 65 minutes  Recommendations for Outpatient Follow-up:  1. Followup with Dr. Sharyn Lull 1 in 1 week. On followup patient's atrial flutter will need to be reassessed at that time.  Discharge Diagnoses:  Principal Problem:   Atrial flutter with rapid ventricular response Active Problems:   Alcoholism /alcohol abuse   Chest pain   Syncope and collapse   Left leg pain   Hypotension   Discharge Condition: Stable and improved  Diet recommendation: Heart healthy  Filed Weights   07/28/13 0300 07/29/13 0400 07/30/13 0648  Weight: 81.4 kg (179 lb 7.3 oz) 86.5 kg (190 lb 11.2 oz) 88.4 kg (194 lb 14.2 oz)    History of present illness:  [name] is a 48 y.o. male with a history of Alcoholism, and Atrial Fibrillation/Flutter who presented to the ED after he [place] out in the park at 7 pm. He complains of havign chest pain and palpitations, the chest pain is in the left chest and radiates into his left arm. He is on Amiodarone Rx and Eliquis at home and reports taking his medications regularly. He drank 2 forty ounce beers today, and he reports that he regularly drinks up to 7 40 ounce beers daily. He also has complaints of left leg painj and swelling x 1 month, he denies any injury to his leg,      Hospital Course:  #1 A. fib with RVR Patient presented to the ED after he passed out at the park around 7 PM. Patient had complaints of chest pain and palpitations with radiation of the chest into the left upper extremity. Patient was noted to be on amiodarone and adequate sent home. Patient did state to admit her physician is [name] some alcohol prior to admission. Patient placed on IV amiodarone drip and cardiology consultation was obtained. Patient was seen in consultation by Dr. Sharyn Lull. Patient was maintained on eliquis  5 mg twice daily. Was recommended to have a low-dose beta blocker if his blood pressure tolerated. Patient's heart rate improved however was noted to have intermittent spells of bradycardia with heart rates in the 40s and was asymptomatic. Cardiac enzymes were cycled which were negative x3. Patient had a recent 2-D echo done on 05/05/2013 and a such this was not repeated. 2-D echo at that time had an EF = 50-55% with no wall motion abnormalities. Patient beta blocker dose was reduced. Patient improved clinically be discharged in stable and improved condition. Patient is to followup with cardiology as outpatient.  #2 syncope Patient presented with a syncopal episode. It was felt likely secondary to alcohol abuse in the setting of hypotension. Patient was hydrated with IV fluids. Cardiac enzymes which was cycled were negative x3. No further episodes. Patient improved clinically and was hydrated with IV fluids. Patient was discharged home in stable and improved condition.   #3 hypo-tension Likely secondary to volume depletion. Improved with hydration.  #4 chest pain Percent presented with chest pain. Cardiac enzymes which was cycled were negative x3. Cardiology was consulted and followed the patient throughout the hospitalization. Was felt no further cardiac workup was needed at this time. Outpatient followup.  #5 alcohol abuse Remained stable throughout the hospitalization. Patient was maintained on the ativan CIWA withdrawal protocol. Outpatient followup.  #6 left lower extremity pain Lower extremity Dopplers which were done were negative. Patient's left lower extremity pain  improved. Outpatient followup.  Procedures:  None  Consultations: Cardiology - Dr. Sharyn Lull   Discharge Exam: Filed Vitals:   07/31/13 0524  BP: 133/70  Pulse: 44  Temp: 97.7 F (36.5 C)  Resp: 17    General: NAD Cardiovascular: RRR Respiratory: CTAB  Discharge Instructions You were cared for by a  hospitalist during your hospital stay. If you have any questions about your discharge medications or the care you received while you were in the hospital after you are discharged, you can call the unit and asked to speak with the hospitalist on call if the hospitalist that took care of you is not available. Once you are discharged, your primary care physician will handle any further medical issues. Please note that NO REFILLS for any discharge medications will be authorized once you are discharged, as it is imperative that you return to your primary care physician (or establish a relationship with a primary care physician if you do not have one) for your aftercare needs so that they can reassess your need for medications and monitor your lab values.      Discharge Instructions   Diet - low sodium heart healthy    Complete by:  As directed      Discharge instructions    Complete by:  As directed   Follow up with Dr Sharyn Lull in 1 week.     Increase activity slowly    Complete by:  As directed             Medication List         amiodarone 200 MG tablet  Commonly known as:  PACERONE  Take 1 tablet (200 mg total) by mouth 2 (two) times daily.     apixaban 5 MG Tabs tablet  Commonly known as:  ELIQUIS  Take 1 tablet (5 mg total) by mouth 2 (two) times daily.     aspirin 325 MG tablet  Take 650 mg by mouth every 6 (six) hours as needed (pain).     folic acid 1 MG tablet  Commonly known as:  FOLVITE  Take 1 tablet (1 mg total) by mouth daily.     metoprolol tartrate 12.5 mg Tabs tablet  Commonly known as:  LOPRESSOR  Take 0.5 tablets (12.5 mg total) by mouth 2 (two) times daily.     multivitamin with minerals Tabs tablet  Take 1 tablet by mouth daily.     thiamine 100 MG tablet  Take 1 tablet (100 mg total) by mouth daily.       Allergies  Allergen Reactions  . Bee Venom Anaphylaxis    Uses epi pen-- last used 08/23/11   Follow-up Information   Follow up with Robynn Pane, MD. Schedule an appointment as soon as possible for a visit in 1 week.   Specialty:  Cardiology   Contact information:   [address] [place] [address] [phone]        The results of significant diagnostics from this hospitalization (including imaging, microbiology, ancillary and laboratory) are listed below for reference.    Significant Diagnostic Studies: No results found.  Microbiology: Recent Results (from the past 240 hour(s))  MRSA PCR SCREENING     Status: None   Collection Time    07/28/13  2:59 AM      Result Value Ref Range Status   MRSA by PCR NEGATIVE  NEGATIVE Final   Comment:  The GeneXpert MRSA Assay (FDA     approved for NASAL specimens     only), is one component of a     comprehensive MRSA colonization     surveillance program. It is not     intended to diagnose MRSA     infection nor to guide or     monitor treatment for     MRSA infections.     Labs: Basic Metabolic Panel:  Recent Labs Lab 07/27/13 2157 07/28/13 0738 07/29/13 0420  NA 141 143 140  K 3.9 4.1 4.3  CL 103 111 108  CO2 22 20 24   GLUCOSE 84 71 89  BUN 9 8 14   CREATININE 0.87 0.79 0.94  CALCIUM 8.8 7.5* 8.2*  MG  --   --  1.6   Liver Function Tests:  Recent Labs Lab 07/29/13 0420  AST 14  ALT 22  ALKPHOS 51  BILITOT 0.2*  PROT 4.9*  ALBUMIN 2.6*   No results found for this basename: LIPASE, AMYLASE,  in the last 168 hours No results found for this basename: AMMONIA,  in the last 168 hours CBC:  Recent Labs Lab 07/27/13 2157 07/28/13 0738  WBC 5.8 4.1  NEUTROABS 2.1  --   HGB 15.3 12.9*  HCT 44.1 37.7*  MCV 92.1 93.8  PLT 199 164   Cardiac Enzymes:  Recent Labs Lab 07/28/13 0017 07/28/13 0138 07/28/13 0738 07/28/13 1333  TROPONINI <0.30 <0.30 <0.30 <0.30   BNP: BNP (last 3 results)  Recent Labs  06/24/13 0544  PROBNP 935.8*   CBG: No results found for this basename: GLUCAP,  in the last 168  hours     Signed:  [facility] And Medical CenterHOMPSON,DANIEL MD [facility] 07/31/2013, 1:59 PM

## 2013-08-23 ENCOUNTER — Encounter (HOSPITAL_COMMUNITY): Payer: Self-pay | Admitting: Emergency Medicine

## 2013-08-23 ENCOUNTER — Emergency Department (HOSPITAL_COMMUNITY): Payer: Self-pay

## 2013-08-23 ENCOUNTER — Emergency Department (HOSPITAL_COMMUNITY)
Admission: EM | Admit: 2013-08-23 | Discharge: 2013-08-23 | Disposition: A | Discharge status: Home / Self Care | Payer: Self-pay | Attending: Emergency Medicine | Admitting: Emergency Medicine

## 2013-08-23 DIAGNOSIS — Y9389 Activity, other specified: Secondary | ICD-10-CM | POA: Insufficient documentation

## 2013-08-23 DIAGNOSIS — F17200 Nicotine dependence, unspecified, uncomplicated: Secondary | ICD-10-CM | POA: Insufficient documentation

## 2013-08-23 DIAGNOSIS — S0990XA Unspecified injury of head, initial encounter: Secondary | ICD-10-CM | POA: Insufficient documentation

## 2013-08-23 DIAGNOSIS — W1809XA Striking against other object with subsequent fall, initial encounter: Secondary | ICD-10-CM | POA: Insufficient documentation

## 2013-08-23 DIAGNOSIS — Z7902 Long term (current) use of antithrombotics/antiplatelets: Secondary | ICD-10-CM | POA: Insufficient documentation

## 2013-08-23 DIAGNOSIS — W19XXXA Unspecified fall, initial encounter: Secondary | ICD-10-CM

## 2013-08-23 DIAGNOSIS — F1020 Alcohol dependence, uncomplicated: Secondary | ICD-10-CM | POA: Insufficient documentation

## 2013-08-23 DIAGNOSIS — Y9210 Unspecified residential institution as the place of occurrence of the external cause: Secondary | ICD-10-CM | POA: Insufficient documentation

## 2013-08-23 DIAGNOSIS — F10220 Alcohol dependence with intoxication, uncomplicated: Secondary | ICD-10-CM

## 2013-08-23 DIAGNOSIS — S0180XA Unspecified open wound of other part of head, initial encounter: Secondary | ICD-10-CM | POA: Insufficient documentation

## 2013-08-23 DIAGNOSIS — I483 Typical atrial flutter: Secondary | ICD-10-CM

## 2013-08-23 DIAGNOSIS — Z8659 Personal history of other mental and behavioral disorders: Secondary | ICD-10-CM | POA: Insufficient documentation

## 2013-08-23 DIAGNOSIS — S01111A Laceration without foreign body of right eyelid and periocular area, initial encounter: Secondary | ICD-10-CM

## 2013-08-23 DIAGNOSIS — I10 Essential (primary) hypertension: Secondary | ICD-10-CM | POA: Insufficient documentation

## 2013-08-23 DIAGNOSIS — Z79899 Other long term (current) drug therapy: Secondary | ICD-10-CM | POA: Insufficient documentation

## 2013-08-23 DIAGNOSIS — I4892 Unspecified atrial flutter: Secondary | ICD-10-CM | POA: Insufficient documentation

## 2013-08-23 LAB — COMPREHENSIVE METABOLIC PANEL
ALT: 21 U/L (ref 0–53)
ANION GAP: 18 — AB (ref 5–15)
AST: 23 U/L (ref 0–37)
Albumin: 4.1 g/dL (ref 3.5–5.2)
Alkaline Phosphatase: 73 U/L (ref 39–117)
BUN: 9 mg/dL (ref 6–23)
CALCIUM: 8.9 mg/dL (ref 8.4–10.5)
CO2: 22 meq/L (ref 19–32)
Chloride: 101 mEq/L (ref 96–112)
Creatinine, Ser: 0.89 mg/dL (ref 0.50–1.35)
GLUCOSE: 98 mg/dL (ref 70–99)
Potassium: 3.9 mEq/L (ref 3.7–5.3)
SODIUM: 141 meq/L (ref 137–147)
TOTAL PROTEIN: 7.7 g/dL (ref 6.0–8.3)
Total Bilirubin: 0.5 mg/dL (ref 0.3–1.2)

## 2013-08-23 LAB — CBC WITH DIFFERENTIAL/PLATELET
Basophils Absolute: 0 10*3/uL (ref 0.0–0.1)
Basophils Relative: 0 % (ref 0–1)
EOS ABS: 0.3 10*3/uL (ref 0.0–0.7)
EOS PCT: 4 % (ref 0–5)
HCT: 47.1 % (ref 39.0–52.0)
HEMOGLOBIN: 16.5 g/dL (ref 13.0–17.0)
LYMPHS ABS: 4.1 10*3/uL — AB (ref 0.7–4.0)
LYMPHS PCT: 56 % — AB (ref 12–46)
MCH: 32.5 pg (ref 26.0–34.0)
MCHC: 35 g/dL (ref 30.0–36.0)
MCV: 92.7 fL (ref 78.0–100.0)
MONOS PCT: 7 % (ref 3–12)
Monocytes Absolute: 0.5 10*3/uL (ref 0.1–1.0)
Neutro Abs: 2.4 10*3/uL (ref 1.7–7.7)
Neutrophils Relative %: 33 % — ABNORMAL LOW (ref 43–77)
PLATELETS: 199 10*3/uL (ref 150–400)
RBC: 5.08 MIL/uL (ref 4.22–5.81)
RDW: 13.2 % (ref 11.5–15.5)
WBC: 7.2 10*3/uL (ref 4.0–10.5)

## 2013-08-23 LAB — I-STAT TROPONIN, ED: Troponin i, poc: 0 ng/mL (ref 0.00–0.08)

## 2013-08-23 LAB — PROTIME-INR
INR: 1.11 (ref 0.00–1.49)
PROTHROMBIN TIME: 14.3 s (ref 11.6–15.2)

## 2013-08-23 LAB — ETHANOL: Alcohol, Ethyl (B): 179 mg/dL — ABNORMAL HIGH (ref 0–11)

## 2013-08-23 MED ORDER — SODIUM CHLORIDE 0.9 % IV SOLN
1000.0000 mL | Freq: Once | INTRAVENOUS | Status: AC
  Administered 2013-08-23: 1000 mL via INTRAVENOUS

## 2013-08-23 MED ORDER — SODIUM CHLORIDE 0.9 % IV SOLN: 1000.0000 mL | INTRAVENOUS | Status: DC

## 2013-08-23 NOTE — ED Provider Notes (Signed)
CSN: [id number]     Arrival date & time 08/23/13  4098 History   First MD Initiated Contact with Patient 08/23/13 [phone]     Chief Complaint  Patient presents with  . Fall     (Consider location/radiation/quality/duration/timing/severity/associated sxs/prior Treatment) HPI 48 yo male presents to the ER from upstairs in the hospital after a fall.  Pt was visiting his girlfriend upstairs after a surgery.  Pt has history of alcoholism, reports he had two 40oz beers tonight.  Pt woke to go to the bathroom and lost his balance, striking his head.  Pt has 2 cm laceration to right eyebrow.  Denies LOC.  Pt is on apixaban for aflutter.  Pt reports he is compliant with all his medications.  He is followed by Dr Sharyn Lull.  Patient is a weakness numbness.  Patient complains of headache Past Medical History  Diagnosis Date  . Hypertension   . Alcoholism /alcohol abuse   . Anxiety and depression   . Tobacco abuse   . Atrial fibrillation    Past Surgical History  Procedure Laterality Date  . Hernia repair      UHR x2   Family History  Problem Relation Age of Onset  . Heart attack Mother   . Heart attack Father   . Hypertension Mother   . Hypertension Father    History  Substance Use Topics  . Smoking status: Current Every Day Smoker -- 1.00 packs/day for 30 years    Types: Cigarettes  . Smokeless tobacco: Never Used  . Alcohol Use: Yes     Comment: Drinks every day, 9-11 40 oz beers.     Review of Systems   See History of Present Illness; otherwise all other systems are reviewed and negative  Allergies  Bee venom  Home Medications   Prior to Admission medications   Medication Sig Start Date End Date Taking? Authorizing Provider  amiodarone (PACERONE) 200 MG tablet Take 1 tablet (200 mg total) by mouth 2 (two) times daily. 07/31/13  Yes Rodolph Bong, MD  apixaban (ELIQUIS) 5 MG TABS tablet Take 1 tablet (5 mg total) by mouth 2 (two) times daily. 06/27/13  Yes Jerald Kief, MD   aspirin 325 MG tablet Take 650 mg by mouth every 6 (six) hours as needed (pain).   Yes Historical Provider, MD  folic acid (FOLVITE) 1 MG tablet Take 1 tablet (1 mg total) by mouth daily. 07/31/13  Yes Rodolph Bong, MD  metoprolol tartrate (LOPRESSOR) 12.5 mg TABS tablet Take 0.5 tablets (12.5 mg total) by mouth 2 (two) times daily. 07/31/13  Yes Rodolph Bong, MD  Multiple Vitamin (MULTIVITAMIN WITH MINERALS) TABS tablet Take 1 tablet by mouth daily. 07/31/13  Yes Rodolph Bong, MD  thiamine 100 MG tablet Take 1 tablet (100 mg total) by mouth daily. 07/31/13  Yes Rodolph Bong, MD   BP 94/62  Pulse 69  Temp(Src) 97.5 F (36.4 C) (Oral)  Resp 19  SpO2 100% Physical Exam  Nursing note and vitals reviewed. Constitutional: He is oriented to person, place, and time. He appears well-developed and well-nourished.  HENT:  Head: Normocephalic.  Right Ear: External ear normal.  Left Ear: External ear normal.  Nose: Nose normal.  Mouth/Throat: Oropharynx is clear and moist.  Eyes: Conjunctivae and EOM are normal. Pupils are equal, round, and reactive to light.  Neck: Normal range of motion. Neck supple. No JVD present. No tracheal deviation present. No thyromegaly present.  Cardiovascular: Normal heart  sounds and intact distal pulses.  Exam reveals no gallop and no friction rub.   No murmur heard. Irregular rate and rhythm  Pulmonary/Chest: Effort normal and breath sounds normal. No stridor. No respiratory distress. He has no wheezes. He has no rales. He exhibits no tenderness.  Abdominal: Soft. Bowel sounds are normal. He exhibits no distension and no mass. There is no tenderness. There is no rebound and no guarding.  Musculoskeletal: Normal range of motion. He exhibits no edema and no tenderness.  Lymphadenopathy:    He has no cervical adenopathy.  Neurological: He is alert and oriented to person, place, and time. He has normal reflexes. No cranial nerve deficit. He exhibits normal  muscle tone. Coordination normal.  Skin: Skin is warm and dry. No rash noted. No erythema. No pallor.  Psychiatric: He has a normal mood and affect. His behavior is normal. Judgment and thought content normal.    ED Course  Procedures (including critical care time) Labs Review Labs Reviewed  CBC WITH DIFFERENTIAL - Abnormal; Notable for the following:    Neutrophils Relative % 33 (*)    Lymphocytes Relative 56 (*)    Lymphs Abs 4.1 (*)    All other components within normal limits  COMPREHENSIVE METABOLIC PANEL - Abnormal; Notable for the following:    Anion gap 18 (*)    All other components within normal limits  ETHANOL - Abnormal; Notable for the following:    Alcohol, Ethyl (B) 179 (*)    All other components within normal limits  PROTIME-INR  I-STAT TROPOININ, ED    Imaging Review Ct Head Wo Contrast  08/23/2013   CLINICAL DATA:  Status post fall. Laceration at the right eyelid. Concern for head or cervical spine injury.  EXAM: CT HEAD WITHOUT CONTRAST  CT CERVICAL SPINE WITHOUT CONTRAST  TECHNIQUE: Multidetector CT imaging of the head and cervical spine was performed following the standard protocol without intravenous contrast. Multiplanar CT image reconstructions of the cervical spine were also generated.  COMPARISON:  CT of the head and cervical spine performed 01/15/2013  FINDINGS: CT HEAD FINDINGS  There is no evidence of acute infarction, mass lesion, or intra- or extra-axial hemorrhage on CT.  Minimal periventricular and subcortical white matter change likely reflects small vessel ischemic microangiopathy.  The posterior fossa, including the cerebellum, brainstem and fourth ventricle, is within normal limits. The third and lateral ventricles, and basal ganglia are unremarkable in appearance. The cerebral hemispheres are symmetric in appearance, with normal gray-white differentiation. No mass effect or midline shift is seen.  There is no evidence of fracture; visualized osseous  structures are unremarkable in appearance. The orbits are within normal limits. The paranasal sinuses and mastoid air cells are well-aerated. The known eyelid laceration is not well characterized on CT.  CT CERVICAL SPINE FINDINGS  There is no evidence of fracture or subluxation. Vertebral bodies demonstrate normal height and alignment. There is narrowing of the intervertebral disc space at C6-C7, with associated anterior and posterior disc osteophyte complexes. Prevertebral soft tissues are within normal limits. The visualized neural foramina are grossly unremarkable.  The thyroid gland is unremarkable in appearance. The visualized lung apices are clear. No significant soft tissue abnormalities are seen. Multiple large maxillary and mandibular dental caries are noted.  IMPRESSION: 1. No evidence of traumatic intracranial injury or fracture. 2. No evidence of fracture or subluxation along the cervical spine. 3. Minimal small vessel ischemic microangiopathy. 4. Multiple large maxillary and mandibular dental caries noted.   Electronically  Signed   By: Roanna RaiderJeffery  [name] M.D.   On: 08/23/2013 04:20   Ct Cervical Spine Wo Contrast  08/23/2013   CLINICAL DATA:  Status post fall. Laceration at the right eyelid. Concern for head or cervical spine injury.  EXAM: CT HEAD WITHOUT CONTRAST  CT CERVICAL SPINE WITHOUT CONTRAST  TECHNIQUE: Multidetector CT imaging of the head and cervical spine was performed following the standard protocol without intravenous contrast. Multiplanar CT image reconstructions of the cervical spine were also generated.  COMPARISON:  CT of the head and cervical spine performed 01/15/2013  FINDINGS: CT HEAD FINDINGS  There is no evidence of acute infarction, mass lesion, or intra- or extra-axial hemorrhage on CT.  Minimal periventricular and subcortical white matter change likely reflects small vessel ischemic microangiopathy.  The posterior fossa, including the cerebellum, brainstem and fourth  ventricle, is within normal limits. The third and lateral ventricles, and basal ganglia are unremarkable in appearance. The cerebral hemispheres are symmetric in appearance, with normal gray-white differentiation. No mass effect or midline shift is seen.  There is no evidence of fracture; visualized osseous structures are unremarkable in appearance. The orbits are within normal limits. The paranasal sinuses and mastoid air cells are well-aerated. The known eyelid laceration is not well characterized on CT.  CT CERVICAL SPINE FINDINGS  There is no evidence of fracture or subluxation. Vertebral bodies demonstrate normal height and alignment. There is narrowing of the intervertebral disc space at C6-C7, with associated anterior and posterior disc osteophyte complexes. Prevertebral soft tissues are within normal limits. The visualized neural foramina are grossly unremarkable.  The thyroid gland is unremarkable in appearance. The visualized lung apices are clear. No significant soft tissue abnormalities are seen. Multiple large maxillary and mandibular dental caries are noted.  IMPRESSION: 1. No evidence of traumatic intracranial injury or fracture. 2. No evidence of fracture or subluxation along the cervical spine. 3. Minimal small vessel ischemic microangiopathy. 4. Multiple large maxillary and mandibular dental caries noted.   Electronically Signed   By: Roanna RaiderJeffery  [name] M.D.   On: 08/23/2013 04:20     EKG Interpretation None     LACERATION REPAIR Performed by: Olivia MackieTTER,Lakesia Dahle M Authorized by: Olivia MackieTTER,Indalecio Malmstrom M Consent: Verbal consent obtained. Risks and benefits: risks, benefits and alternatives were discussed Consent given by: patient Patient identity confirmed: provided demographic data Prepped and Draped in normal sterile fashion Wound explored  Laceration Location: right eyebrow  Laceration Length: 2cm  No Foreign Bodies seen or palpated  Anesthesia: none  Irrigation method: syringe Amount of  cleaning: standard  Skin closure: tissue adhesive  Number of sutures: none  Technique: dermabond  Patient tolerance: Patient tolerated the procedure well with no immediate complications.  MDM   Final diagnoses:  Eyebrow laceration, right, initial encounter  Alcohol intoxication in active alcoholic, uncomplicated  Typical atrial flutter  Fall, initial encounter     48 year old male status post fall with eyebrow laceration, on blood thinners.  Patient initially with atrial fibrillation with rapid ventricular rate.  After IV fluids, heart rate improved, no longer tachycardic.  CT scan without intercranial injury.  Patient is mentating normally.  Laceration repaired with Dermabond.  Patient slightly unsteady thought to be secondary to his alcohol intoxication, patient is stable, however for return to his girlfriend's hospital room.  The patient has been counseled on his alcoholism and need to followup with his primary care Dr. and/or Dr. Natale MilchHarwani     Korie Brabson M Tonesha Tsou, MD 08/23/13 [phone]

## 2013-08-23 NOTE — ED Notes (Signed)
This RN escorted the pt to the floor where his fiance is admitted.

## 2013-08-23 NOTE — Discharge Instructions (Signed)
Alcohol Use Disorder °Alcohol use disorder is a mental disorder. It is not a one-time incident of heavy drinking. Alcohol use disorder is the excessive and uncontrollable use of alcohol over time that leads to problems with functioning in one or more areas of daily living. People with this disorder risk harming themselves and others when they drink to excess. Alcohol use disorder also can cause other mental disorders, such as mood and anxiety disorders, and serious physical problems. People with alcohol use disorder often misuse other drugs.  °Alcohol use disorder is common and widespread. Some people with this disorder drink alcohol to cope with or escape from negative life events. Others drink to relieve chronic pain or symptoms of mental illness. People with a family history of alcohol use disorder are at higher risk of losing control and using alcohol to excess.  °SYMPTOMS  °Signs and symptoms of alcohol use disorder may include the following:  °· Consumption of alcohol in larger amounts or over a longer period of time than intended. °· Multiple unsuccessful attempts to cut down or control alcohol use.   °· A great Lumm of time spent obtaining alcohol, using alcohol, or recovering from the effects of alcohol (hangover). °· A strong desire or urge to use alcohol (cravings).   °· Continued use of alcohol despite problems at work, school, or home because of alcohol use.   °· Continued use of alcohol despite problems in relationships because of alcohol use. °· Continued use of alcohol in situations when it is physically hazardous, such as driving a car. °· Continued use of alcohol despite awareness of a physical or psychological problem that is likely related to alcohol use. Physical problems related to alcohol use can involve the brain, heart, liver, stomach, and intestines. Psychological problems related to alcohol use include intoxication, depression, anxiety, psychosis, delirium, and dementia.   °· The need for  increased amounts of alcohol to achieve the same desired effect, or a decreased effect from the consumption of the same amount of alcohol (tolerance). °· Withdrawal symptoms upon reducing or stopping alcohol use, or alcohol use to reduce or avoid withdrawal symptoms. Withdrawal symptoms include: °¨ Racing heart. °¨ Hand tremor. °¨ Difficulty sleeping. °¨ Nausea. °¨ Vomiting. °¨ Hallucinations. °¨ Restlessness. °¨ Seizures. °DIAGNOSIS °Alcohol use disorder is diagnosed through an assessment by your health care provider. Your health care provider may start by asking three or four questions to screen for excessive or problematic alcohol use. To confirm a diagnosis of alcohol use disorder, at least two symptoms must be present within a 12-month period. The severity of alcohol use disorder depends on the number of symptoms: °· Mild--two or three. °· Moderate--four or five. °· Severe--six or more. °Your health care provider may perform a physical exam or use results from lab tests to see if you have physical problems resulting from alcohol use. Your health care provider may refer you to a mental health professional for evaluation. °TREATMENT  °Some people with alcohol use disorder are able to reduce their alcohol use to low-risk levels. Some people with alcohol use disorder need to quit drinking alcohol. When necessary, mental health professionals with specialized training in substance use treatment can help. Your health care provider can help you decide how severe your alcohol use disorder is and what type of treatment you need. The following forms of treatment are available:  °· Detoxification. Detoxification involves the use of prescription medicines to prevent alcohol withdrawal symptoms in the first week after quitting. This is important for people with a history of symptoms   of withdrawal and for heavy drinkers who are likely to have withdrawal symptoms. Alcohol withdrawal can be dangerous and, in severe cases, cause  death. Detoxification is usually provided in a hospital or in-patient substance use treatment facility.  Counseling or talk therapy. Talk therapy is provided by substance use treatment counselors. It addresses the reasons people use alcohol and ways to keep them from drinking again. The goals of talk therapy are to help people with alcohol use disorder find healthy activities and ways to cope with life stress, to identify and avoid triggers for alcohol use, and to handle cravings, which can cause relapse.  Medicines.Different medicines can help treat alcohol use disorder through the following actions:  Decrease alcohol cravings.  Decrease the positive reward response felt from alcohol use.  Produce an uncomfortable physical reaction when alcohol is used (aversion therapy).  Support groups. Support groups are run by people who have quit drinking. They provide emotional support, advice, and guidance. These forms of treatment are often combined. Some people with alcohol use disorder benefit from intensive combination treatment provided by specialized substance use treatment centers. Both inpatient and outpatient treatment programs are available. Document Released: 02/25/2004 Document Revised: 06/03/2013 Document Reviewed: 04/26/2012 [facility] Patient Information 2015 [place], [place]. This information is not intended to replace advice given to you by your health care provider. Make sure you discuss any questions you have with your health care provider.  Atrial Flutter Atrial flutter is a heart rhythm that can cause the heart to beat very fast (tachycardia). It originates in the upper chambers of the heart (atria). In atrial flutter, the top chambers of the heart (atria) often beat much faster than the bottom chambers of the heart (ventricles). Atrial flutter has a regular "saw toothed" appearance in an EKG readout. An EKG is a test that records the electrical activity of the heart. Atrial flutter can  cause the heart to beat up to 150 beats per minute (BPM). Atrial flutter can either be short lived (paroxysmal) or permanent.  CAUSES  Causes of atrial flutter can be many. Some of these include:  Heart related issues:  Heart attack (myocardial infarction).  Heart failure.  Heart valve problems.  Poorly controlled high blood pressure (hypertension).  After open heart surgery.  Lung related issues:  A blood clot in the lungs (pulmonary embolism).  Chronic obstructive pulmonary disease (COPD). Medications used to treat COPD can attribute to atrial flutter.  Other related causes:  Hyperthyroidism.  Caffeine.  Some decongestant cold medications.  Low electrolyte levels such as potassium or magnesium.  Cocaine. SYMPTOMS  An awareness of your heart beating rapidly (palpitations).  Shortness of breath.  Chest pain.  Low blood pressure (hypotension).  Dizziness or fainting. DIAGNOSIS  Different tests can be performed to diagnose atrial flutter.   An EKG.  Holter monitor. This is a 24-hour recording of your heart rhythm. You will also be given a diary. Write down all symptoms that you have and what you were doing at the time you experienced symptoms.  Cardiac event monitor. This small device can be worn for up to 30 days. When you have heart symptoms, you will push a button on the device. This will then record your heart rhythm.  Echocardiogram. This is an imaging test to look at your heart. Your caregiver will look at your heart valves and the ventricles.  Stress test. This test can help determine if the atrial flutter is related to exercise or if coronary artery disease is present.  Laboratory  studies will look at certain blood levels like:  Complete blood count (CBC).  Potassium.  Magnesium.  Thyroid function. TREATMENT  Treatment of atrial flutter varies. A combination of therapies may be used or sometimes atrial flutter may need only 1 type of treatment.   Lab work: If your blood work, such as your electrolytes (potassium, magnesium) or your thyroid function tests, are abnormal, your caregiver will treat them accordingly.  Medication:  There are several different types of medications that can convert your heart to a normal rhythm and prevent atrial flutter from reoccurring.  Nonsurgical procedures: Nonsurgical techniques may be used to control atrial flutter. Some examples include:  Cardioversion. This technique uses either drugs or an electrical shock to restore a normal heart rhythm:  Cardioversion drugs may be given through an intravenous (IV) line to help "reset" the heart rhythm.  In electrical cardioversion, your caregiver shocks your heart with electrical energy. This helps to reset the heartbeat to a normal rhythm.  Ablation. If atrial flutter is a persistent problem, an ablation may be needed. This procedure is done under mild sedation. High frequency radio-wave energy is used to destroy the area of heart tissue responsible for atrial flutter. SEEK IMMEDIATE MEDICAL CARE IF:  You have:  Dizziness.  Near fainting or fainting.  Shortness of breath.  Chest pain or pressure.  Sudden nausea or vomiting.  Profuse sweating. If you have the above symptoms, call your local emergency service immediately! Do not drive yourself to the hospital. MAKE SURE YOU:   Understand these instructions.  Will watch your condition.  Will get help right away if you are not doing well or get worse. Document Released: 06/05/2008 Document Revised: 06/03/2013 Document Reviewed: 06/05/2008 [facility] Patient Information 2015 [place], [place]. This information is not intended to replace advice given to you by your health care provider. Make sure you discuss any questions you have with your health care provider.  Facial Laceration A facial laceration is a cut on the face. These injuries can be painful and cause bleeding. Some cuts may need to be closed  with stitches (sutures), skin adhesive strips, or wound glue. Cuts usually heal quickly but can leave a scar. It can take 1-2 years for the scar to go away completely. HOME CARE   Only take medicines as told by your doctor.  Follow your doctor's instructions for wound care. For Stitches:  Keep the cut clean and dry.  If you have a bandage (dressing), change it at least once a day. Change the bandage if it gets wet or dirty, or as told by your doctor.  Wash the cut with soap and water 2 times a day. Rinse the cut with water. Pat it dry with a clean towel.  Put a thin layer of medicated cream on the cut as told by your doctor.  You may shower after the first 24 hours. Do not soak the cut in water until the stitches are removed.  Have your stitches removed as told by your doctor.  Do not wear any makeup until a few days after your stitches are removed. For Skin Adhesive Strips:  Keep the cut clean and dry.  Do not get the strips wet. You may take a bath, but be careful to keep the cut dry.  If the cut gets wet, pat it dry with a clean towel.  The strips will fall off on their own. Do not remove the strips that are still stuck to the cut. For Wound Glue:  You may shower or take baths. Do not soak or scrub the cut. Do not swim. Avoid heavy sweating until the glue falls off on its own. After a shower or bath, pat the cut dry with a clean towel.  Do not put medicine or makeup on your cut until the glue falls off.  If you have a bandage, do not put tape over the glue.  Avoid lots of sunlight or tanning lamps until the glue falls off.  The glue will fall off on its own in 5-10 days. Do not pick at the glue. After Healing: Put sunscreen on the cut for the first year to reduce your scar. GET HELP RIGHT AWAY IF:   Your cut area gets red, painful, or puffy (swollen).  You see a yellowish-white fluid (pus) coming from the cut.  You have chills or a fever. MAKE SURE YOU:    Understand these instructions.  Will watch your condition.  Will get help right away if you are not doing well or get worse. Document Released: 07/06/2007 Document Revised: 11/07/2012 Document Reviewed: 08/30/2012 [facility] Patient Information 2015 [place], [place]. This information is not intended to replace advice given to you by your health care provider. Make sure you discuss any questions you have with your health care provider.

## 2013-08-23 NOTE — ED Notes (Addendum)
Pt. lost his balance and  fell in bathroom this evening , + ETOH , alert and oriented  at arrival , presents with right eyelid laceration approx, 1/2 inch with minimal bleeding.  Respirations unlabored . Assisted to go to toilet while at triage.

## 2013-10-19 ENCOUNTER — Emergency Department (HOSPITAL_COMMUNITY)
Admission: EM | Admit: 2013-10-19 | Discharge: 2013-10-19 | Disposition: A | Discharge status: Other Acute Inpatient Hospital | Payer: Self-pay | Attending: Emergency Medicine | Admitting: Emergency Medicine

## 2013-10-19 ENCOUNTER — Observation Stay (HOSPITAL_COMMUNITY)
Admission: AD | Admit: 2013-10-19 | Discharge: 2013-10-21 | Disposition: A | Discharge status: Home / Self Care | Payer: Self-pay | Source: Intra-hospital | Attending: Psychiatry | Admitting: Psychiatry

## 2013-10-19 ENCOUNTER — Encounter (HOSPITAL_COMMUNITY): Payer: Self-pay | Admitting: *Deleted

## 2013-10-19 ENCOUNTER — Encounter (HOSPITAL_COMMUNITY): Payer: Self-pay | Admitting: Emergency Medicine

## 2013-10-19 DIAGNOSIS — F10988 Alcohol use, unspecified with other alcohol-induced disorder: Secondary | ICD-10-CM | POA: Insufficient documentation

## 2013-10-19 DIAGNOSIS — F339 Major depressive disorder, recurrent, unspecified: Secondary | ICD-10-CM | POA: Insufficient documentation

## 2013-10-19 DIAGNOSIS — R112 Nausea with vomiting, unspecified: Secondary | ICD-10-CM | POA: Insufficient documentation

## 2013-10-19 DIAGNOSIS — F411 Generalized anxiety disorder: Secondary | ICD-10-CM | POA: Insufficient documentation

## 2013-10-19 DIAGNOSIS — Z7902 Long term (current) use of antithrombotics/antiplatelets: Secondary | ICD-10-CM | POA: Insufficient documentation

## 2013-10-19 DIAGNOSIS — Z008 Encounter for other general examination: Secondary | ICD-10-CM | POA: Insufficient documentation

## 2013-10-19 DIAGNOSIS — F1020 Alcohol dependence, uncomplicated: Secondary | ICD-10-CM | POA: Insufficient documentation

## 2013-10-19 DIAGNOSIS — I4891 Unspecified atrial fibrillation: Secondary | ICD-10-CM | POA: Insufficient documentation

## 2013-10-19 DIAGNOSIS — F17200 Nicotine dependence, unspecified, uncomplicated: Secondary | ICD-10-CM | POA: Insufficient documentation

## 2013-10-19 DIAGNOSIS — I10 Essential (primary) hypertension: Secondary | ICD-10-CM | POA: Insufficient documentation

## 2013-10-19 DIAGNOSIS — F329 Major depressive disorder, single episode, unspecified: Secondary | ICD-10-CM | POA: Insufficient documentation

## 2013-10-19 DIAGNOSIS — F4310 Post-traumatic stress disorder, unspecified: Secondary | ICD-10-CM | POA: Insufficient documentation

## 2013-10-19 DIAGNOSIS — Z79899 Other long term (current) drug therapy: Secondary | ICD-10-CM | POA: Insufficient documentation

## 2013-10-19 DIAGNOSIS — F10230 Alcohol dependence with withdrawal, uncomplicated: Secondary | ICD-10-CM

## 2013-10-19 DIAGNOSIS — F3289 Other specified depressive episodes: Secondary | ICD-10-CM | POA: Insufficient documentation

## 2013-10-19 DIAGNOSIS — IMO0001 Reserved for inherently not codable concepts without codable children: Secondary | ICD-10-CM

## 2013-10-19 LAB — CBC WITH DIFFERENTIAL/PLATELET
Basophils Absolute: 0 10*3/uL (ref 0.0–0.1)
Basophils Relative: 1 % (ref 0–1)
EOS ABS: 0.2 10*3/uL (ref 0.0–0.7)
EOS PCT: 5 % (ref 0–5)
HEMATOCRIT: 40.1 % (ref 39.0–52.0)
Hemoglobin: 14.1 g/dL (ref 13.0–17.0)
LYMPHS ABS: 2 10*3/uL (ref 0.7–4.0)
Lymphocytes Relative: 44 % (ref 12–46)
MCH: 32.2 pg (ref 26.0–34.0)
MCHC: 35.2 g/dL (ref 30.0–36.0)
MCV: 91.6 fL (ref 78.0–100.0)
MONO ABS: 0.6 10*3/uL (ref 0.1–1.0)
MONOS PCT: 13 % — AB (ref 3–12)
Neutro Abs: 1.6 10*3/uL — ABNORMAL LOW (ref 1.7–7.7)
Neutrophils Relative %: 37 % — ABNORMAL LOW (ref 43–77)
Platelets: 191 10*3/uL (ref 150–400)
RBC: 4.38 MIL/uL (ref 4.22–5.81)
RDW: 13 % (ref 11.5–15.5)
WBC: 4.4 10*3/uL (ref 4.0–10.5)

## 2013-10-19 LAB — RAPID URINE DRUG SCREEN, HOSP PERFORMED
Amphetamines: NOT DETECTED
BARBITURATES: NOT DETECTED
Benzodiazepines: NOT DETECTED
COCAINE: NOT DETECTED
Opiates: NOT DETECTED
Tetrahydrocannabinol: NOT DETECTED

## 2013-10-19 LAB — COMPREHENSIVE METABOLIC PANEL
ALT: 26 U/L (ref 0–53)
AST: 23 U/L (ref 0–37)
Albumin: 3.9 g/dL (ref 3.5–5.2)
Alkaline Phosphatase: 65 U/L (ref 39–117)
Anion gap: 18 — ABNORMAL HIGH (ref 5–15)
BUN: 5 mg/dL — AB (ref 6–23)
CALCIUM: 9.1 mg/dL (ref 8.4–10.5)
CO2: 20 meq/L (ref 19–32)
CREATININE: 0.66 mg/dL (ref 0.50–1.35)
Chloride: 101 mEq/L (ref 96–112)
GFR calc Af Amer: >90 mL/min (ref >90)
GLUCOSE: 86 mg/dL (ref 70–99)
Potassium: 3.9 mEq/L (ref 3.7–5.3)
Sodium: 139 mEq/L (ref 137–147)
Total Bilirubin: 0.5 mg/dL (ref 0.3–1.2)
Total Protein: 7.2 g/dL (ref 6.0–8.3)

## 2013-10-19 LAB — ETHANOL: ALCOHOL ETHYL (B): 204 mg/dL — AB (ref 0–11)

## 2013-10-19 MED ORDER — VITAMIN B-1 100 MG PO TABS
100.0000 mg | ORAL_TABLET | Freq: Every day | ORAL | Status: DC
  Administered 2013-10-19: 100 mg via ORAL
  Filled 2013-10-19: qty 1

## 2013-10-19 MED ORDER — CHLORDIAZEPOXIDE HCL 25 MG PO CAPS: 25.0000 mg | ORAL_CAPSULE | Freq: Every day | ORAL | Status: DC

## 2013-10-19 MED ORDER — CHLORDIAZEPOXIDE HCL 25 MG PO CAPS: 25.0000 mg | ORAL_CAPSULE | ORAL | Status: DC

## 2013-10-19 MED ORDER — CHLORDIAZEPOXIDE HCL 25 MG PO CAPS
50.0000 mg | ORAL_CAPSULE | Freq: Once | ORAL | Status: AC
  Administered 2013-10-19: 50 mg via ORAL
  Filled 2013-10-19: qty 2

## 2013-10-19 MED ORDER — ALUM & MAG HYDROXIDE-SIMETH 200-200-20 MG/5ML PO SUSP: 30.0000 mL | ORAL | Status: DC | PRN

## 2013-10-19 MED ORDER — HYDROXYZINE HCL 25 MG PO TABS: 25.0000 mg | ORAL_TABLET | Freq: Four times a day (QID) | ORAL | Status: DC | PRN

## 2013-10-19 MED ORDER — MAGNESIUM HYDROXIDE 400 MG/5ML PO SUSP: 30.0000 mL | Freq: Every day | ORAL | Status: DC | PRN

## 2013-10-19 MED ORDER — VITAMIN B-1 100 MG PO TABS
100.0000 mg | ORAL_TABLET | Freq: Every day | ORAL | Status: DC
  Administered 2013-10-20 – 2013-10-21 (×2): 100 mg via ORAL
  Filled 2013-10-19 (×4): qty 1

## 2013-10-19 MED ORDER — ADULT MULTIVITAMIN W/MINERALS CH
1.0000 | ORAL_TABLET | Freq: Every day | ORAL | Status: DC
  Administered 2013-10-20 – 2013-10-21 (×2): 1 via ORAL
  Filled 2013-10-19 (×4): qty 1

## 2013-10-19 MED ORDER — LORAZEPAM 1 MG PO TABS
0.0000 mg | ORAL_TABLET | Freq: Four times a day (QID) | ORAL | Status: DC
  Administered 2013-10-19: 2 mg via ORAL
  Filled 2013-10-19: qty 2

## 2013-10-19 MED ORDER — ACETAMINOPHEN 325 MG PO TABS: 650.0000 mg | ORAL_TABLET | Freq: Four times a day (QID) | ORAL | Status: DC | PRN

## 2013-10-19 MED ORDER — CHLORDIAZEPOXIDE HCL 25 MG PO CAPS
25.0000 mg | ORAL_CAPSULE | Freq: Three times a day (TID) | ORAL | Status: DC
  Administered 2013-10-21: 25 mg via ORAL
  Filled 2013-10-19: qty 1

## 2013-10-19 MED ORDER — LOPERAMIDE HCL 2 MG PO CAPS: 2.0000 mg | ORAL_CAPSULE | ORAL | Status: DC | PRN

## 2013-10-19 MED ORDER — CHLORDIAZEPOXIDE HCL 25 MG PO CAPS
25.0000 mg | ORAL_CAPSULE | Freq: Four times a day (QID) | ORAL | Status: AC
  Administered 2013-10-20 (×4): 25 mg via ORAL
  Filled 2013-10-19 (×4): qty 1

## 2013-10-19 MED ORDER — THIAMINE HCL 100 MG/ML IJ SOLN
100.0000 mg | Freq: Once | INTRAMUSCULAR | Status: AC
  Administered 2013-10-19: 100 mg via INTRAMUSCULAR
  Filled 2013-10-19: qty 2

## 2013-10-19 MED ORDER — CHLORDIAZEPOXIDE HCL 25 MG PO CAPS
25.0000 mg | ORAL_CAPSULE | Freq: Four times a day (QID) | ORAL | Status: DC | PRN
  Administered 2013-10-20: 25 mg via ORAL
  Filled 2013-10-19: qty 1

## 2013-10-19 MED ORDER — LORAZEPAM 1 MG PO TABS: 0.0000 mg | ORAL_TABLET | Freq: Two times a day (BID) | ORAL | Status: DC

## 2013-10-19 MED ORDER — THIAMINE HCL 100 MG/ML IJ SOLN: 100.0000 mg | Freq: Every day | INTRAMUSCULAR | Status: DC

## 2013-10-19 MED ORDER — ONDANSETRON 4 MG PO TBDP: 4.0000 mg | ORAL_TABLET | Freq: Four times a day (QID) | ORAL | Status: DC | PRN

## 2013-10-19 NOTE — Progress Notes (Signed)
D: Patient was admitted to the observation unit from [facility] seeking for ETOH detox and inpatient treatment. Patient appear worried and sad. Patient reported been depressed since he lost his 23th year old daughter in accident this summer. Patient also reported feeling hopelessness and pleaded that nurses and doctors should help him "get life back on track and do what's right". Patient denied pain, SI, AH/VH at this time. Patient reported tremor, anxiety, stomach ache as his withdrawal symptoms. Due medications given as ordered. Patient is FALL RISK as he reports frequent falls with no apparent reasons. A: offered support and encouragement. Encouraged patient to verbalize needs to staff and to seek for help when needed. R: Patient very receptive. Will continue to monitor patient.

## 2013-10-19 NOTE — BH Assessment (Signed)
Consulted with Dr. Fonnie Jarvis about PT.  Scheduling a tele-assessment, will have to wait, PT being transferred from current bed to Pod C.

## 2013-10-19 NOTE — ED Notes (Signed)
EKG faxed to [facility].

## 2013-10-19 NOTE — ED Notes (Signed)
Attempted Report 

## 2013-10-19 NOTE — ED Provider Notes (Signed)
CSN: [id number]     Arrival date & time 10/19/13  1331 History   First MD Initiated Contact with Patient 10/19/13 1332     Chief Complaint  Patient presents with  . Medical Clearance     (Consider location/radiation/quality/duration/timing/severity/associated sxs/prior Treatment) HPI Comments: Patient with history of alcoholism presents requesting detox from alcohol. Patient states that he has been drinking very heavily over the past several weeks. Heaviest drinking was in the past several days. Patient has been drinking beer, unable to quantify amount. He has associated N/V. He denies other substance abuse. He denies SI/HI. He has been drinking to the point where he passes out. He denies other medical complaints including fever, URI symptoms, chest pain, shortness of breath, abdominal pain, diarrhea, urinary symptoms. Patient states that he has had poor oral intake while drinking alcohol. No history of hallucinations or seizures when withdrawing in past.  The history is provided by the patient.    Past Medical History  Diagnosis Date  . Hypertension   . Alcoholism /alcohol abuse   . Anxiety and depression   . Tobacco abuse   . Atrial fibrillation    Past Surgical History  Procedure Laterality Date  . Hernia repair      UHR x2   Family History  Problem Relation Age of Onset  . Heart attack Mother   . Heart attack Father   . Hypertension Mother   . Hypertension Father    History  Substance Use Topics  . Smoking status: Current Every Day Smoker -- 1.00 packs/day for 30 years    Types: Cigarettes  . Smokeless tobacco: Never Used  . Alcohol Use: Yes     Comment: Drinks every day, 9-11 40 oz beers.     Review of Systems  Constitutional: Negative for fever.  HENT: Negative for rhinorrhea and sore throat.   Eyes: Negative for redness.  Respiratory: Negative for cough.   Cardiovascular: Negative for chest pain.  Gastrointestinal: Positive for nausea and vomiting. Negative  for abdominal pain and diarrhea.  Genitourinary: Negative for dysuria.  Musculoskeletal: Negative for myalgias.  Skin: Negative for rash.  Neurological: Positive for syncope (blacking out due to alcohol). Negative for headaches.  Psychiatric/Behavioral: Negative for suicidal ideas.    Allergies  Bee venom  Home Medications   Prior to Admission medications   Medication Sig Start Date End Date Taking? Authorizing Provider  amiodarone (PACERONE) 200 MG tablet Take 1 tablet (200 mg total) by mouth 2 (two) times daily. 07/31/13   Rodolph Bong, MD  apixaban (ELIQUIS) 5 MG TABS tablet Take 1 tablet (5 mg total) by mouth 2 (two) times daily. 06/27/13   Jerald Kief, MD  aspirin 325 MG tablet Take 650 mg by mouth every 6 (six) hours as needed (pain).    Historical Provider, MD  folic acid (FOLVITE) 1 MG tablet Take 1 tablet (1 mg total) by mouth daily. 07/31/13   Rodolph Bong, MD  metoprolol tartrate (LOPRESSOR) 12.5 mg TABS tablet Take 0.5 tablets (12.5 mg total) by mouth 2 (two) times daily. 07/31/13   Rodolph Bong, MD  Multiple Vitamin (MULTIVITAMIN WITH MINERALS) TABS tablet Take 1 tablet by mouth daily. 07/31/13   Rodolph Bong, MD  thiamine 100 MG tablet Take 1 tablet (100 mg total) by mouth daily. 07/31/13   Rodolph Bong, MD   BP 105/62  Pulse 88  Temp(Src) 98.2 F (36.8 C) (Oral)  Resp 12  Ht 5\' 6"  (1.676 m)  Wt 202 lb (91.627 kg)  BMI 32.62 kg/m2  SpO2 96%  Physical Exam  Nursing note and vitals reviewed. Constitutional: He appears well-developed and well-nourished.  Tearful when addressing alcohol use.   HENT:  Head: Normocephalic and atraumatic.  Eyes: Conjunctivae are normal. Right eye exhibits no discharge. Left eye exhibits no discharge.  Neck: Normal range of motion. Neck supple.  Cardiovascular: Normal rate, regular rhythm and normal heart sounds.   Pulmonary/Chest: Effort normal and breath sounds normal.  Abdominal: Soft. There is no tenderness.   Neurological: He is alert.  Mildly slurred speech.   Skin: Skin is warm and dry.  Psychiatric: His behavior is normal. His affect is labile. His speech is slurred. He exhibits a depressed mood. He expresses no homicidal and no suicidal ideation. He expresses no suicidal plans and no homicidal plans.    ED Course  Procedures (including critical care time) Labs Review Labs Reviewed  CBC WITH DIFFERENTIAL - Abnormal; Notable for the following:    Neutrophils Relative % 37 (*)    Neutro Abs 1.6 (*)    Monocytes Relative 13 (*)    All other components within normal limits  COMPREHENSIVE METABOLIC PANEL - Abnormal; Notable for the following:    BUN 5 (*)    Anion gap 18 (*)    All other components within normal limits  ETHANOL - Abnormal; Notable for the following:    Alcohol, Ethyl (B) 204 (*)    All other components within normal limits  URINE RAPID DRUG SCREEN (HOSP PERFORMED)    Imaging Review No results found.   EKG Interpretation None      2:07 PM Patient seen and examined. Work-up initiated. Holding orders and CIWA completed.   Vital signs reviewed and are as follows: BP 105/62  Pulse 88  Temp(Src) 98.2 F (36.8 C) (Oral)  Resp 12  Ht  (1.676 m)  Wt 202 lb (91.627 kg)  BMI 32.62 kg/m2  SpO2 96%  3:42 PM Pending TTS eval. Labs reviewed. Pt is medically cleared.   MDM   Final diagnoses:  Alcoholism /alcohol abuse   Pt is medically cleared, pending TTS eval.     Renne Crigler, PA-C 10/19/13 1631

## 2013-10-19 NOTE — ED Provider Notes (Signed)
Dr Jama Flavors has accepted the pt to [facility].  Rolan Bucco, MD 10/19/13 [phone]

## 2013-10-19 NOTE — Progress Notes (Signed)
[facility] INPATIENT:  Family/Significant Other Suicide Prevention Education  Suicide Prevention Education:  Patient Refusal for Family/Significant Other Suicide Prevention Education: The patient [name] has refused to provide written consent for family/significant other to be provided Family/Significant Other Suicide Prevention Education during admission and/or prior to discharge.  Physician notified.  Glenice Laine B 10/19/2013, 11:08 PM

## 2013-10-19 NOTE — ED Notes (Signed)
[facility] here to pick Pt. Up and take him to [facility]. Awaiting Call back from [facility] re: EKG. EDP Here saw EKG and cleared.

## 2013-10-19 NOTE — BH Assessment (Signed)
Assessment Note  [name] is an 48 y.o. male.  PT reported coming to [facility] for for ETOH detox and inpt tx.  PT reported consuming 14 (12 oz) beers this morning and experiencing SI w/no plans and AH.  He denied current SI, HI, AH, and VH.  He reported experiencing AH after becoming intoxicated on ETOH.  PT reported daily consumption of 17 beers w/periodic black outs for several yrs.  He reported no period of abstinence.  He reported consuming a beer upon awakening so not to experience w/ds.  PT reported previous residential SA tx in 94s and reported no current SA treatment.  PT is requesting inpt SA tx.       Axis I: Depressive Disorder NOS and Alcohol Use Disorder, severe Axis II: Deferred Axis IV: problems related to social environment and problems with access to health care services Axis V: 31-40 impairment in reality testing  Past Medical History:  Past Medical History  Diagnosis Date  . Hypertension   . Alcoholism /alcohol abuse   . Anxiety and depression   . Tobacco abuse   . Atrial fibrillation     Past Surgical History  Procedure Laterality Date  . Hernia repair      UHR x2    Family History:  Family History  Problem Relation Age of Onset  . Heart attack Mother   . Heart attack Father   . Hypertension Mother   . Hypertension Father     Social History:  reports that he has been smoking Cigarettes.  He has a 30 pack-year smoking history. He has never used smokeless tobacco. He reports that he drinks alcohol. He reports that he does not use illicit drugs.  Additional Social History:     CIWA: CIWA-Ar BP: 105/89 mmHg Pulse Rate: 118 Nausea and Vomiting: mild nausea with no vomiting Tactile Disturbances: none Tremor: moderate, with patient's arms extended Auditory Disturbances: moderately severe hallucinations Paroxysmal Sweats: no sweat visible Visual Disturbances: not present Anxiety: moderately anxious, or guarded, so anxiety is inferred Headache, Fullness  in Head: none present Agitation: normal activity Orientation and Clouding of Sensorium: oriented and can do serial additions CIWA-Ar Total: 13 COWS:    Allergies:  Allergies  Allergen Reactions  . Bee Venom Anaphylaxis    Uses epi pen-- last used 08/23/11    Home Medications:  (Not in a hospital admission)  OB/GYN Status:  No LMP for male patient.  General Assessment Data Location of Assessment: [facility] Assessment Services ACT Assessment: Yes Is this a Tele or Face-to-Face Assessment?: Tele Assessment Is this an Initial Assessment or a Re-assessment for this encounter?: Initial Assessment Living Arrangements: Spouse/significant other Can pt return to current living arrangement?: Yes Is patient capable of signing voluntary admission?: Yes Transfer from: Home Referral Source: Self/Family/Friend  Medical Screening Exam [facility] Walk-in ONLY) Medical Exam completed: Yes  [facility] Crisis Care Plan Living Arrangements: Spouse/significant other Name of Psychiatrist: None Name of Therapist: None  Education Status Is patient currently in school?: No Current Grade: N/A Highest grade of school patient has completed: N/A Name of school: N/A Contact person: N/A  Risk to self with the past 6 months Suicidal Ideation: No-Not Currently/Within Last 6 Months (PT reported this morning, but not currently) Suicidal Intent: No Is patient at risk for suicide?: No Suicidal Plan?: No Access to Means: No What has been your use of drugs/alcohol within the last 12 months?: daily Alcohol consumption Previous Attempts/Gestures: No How many times?: 0 Other Self Harm Risks: None Triggers  for Past Attempts: None known Intentional Self Injurious Behavior: None Family Suicide History: Unknown Recent stressful life event(s): Other (Comment) (chronic alcohol consumption) Persecutory voices/beliefs?: No Depression: Yes Depression Symptoms: Feeling worthless/self pity Substance abuse history and/or treatment  for substance abuse?: Yes (ETOH, last use today, "drank 18-40s", been drinking heavily for 60 months, denies any other substance use) Suicide prevention information given to non-admitted patients: Not applicable  Risk to Others within the past 6 months Homicidal Ideation: No Thoughts of Harm to Others: No Current Homicidal Intent: No Current Homicidal Plan: No Access to Homicidal Means: No Identified Victim: N/A History of harm to others?: No Assessment of Violence: None Noted Violent Behavior Description: N/A Does patient have access to weapons?: No Criminal Charges Pending?: No Does patient have a court date: No  Psychosis Hallucinations: None noted Delusions: None noted  Mental Status Report Appear/Hygiene: In hospital gown Eye Contact: Poor Motor Activity: Other (Comment) (PT drowsy) Speech: Logical/coherent;Slow Level of Consciousness: Drowsy Mood: Depressed;Sad Affect: Flat (drowsy) Anxiety Level: Panic Attacks (PT reported panic attack on way to ED) Panic attack frequency: Unknown Most recent panic attack: Today Thought Processes: Coherent Judgement: Impaired Orientation: Person;Place Obsessive Compulsive Thoughts/Behaviors: None  Cognitive Functioning Concentration: Poor Memory: Recent Intact;Remote Intact IQ: Average Insight: Poor Impulse Control: Poor Appetite: Poor Weight Loss: 0 Weight Gain: 0 Sleep: Decreased Total Hours of Sleep: 6 Vegetative Symptoms: None  ADLScreening [facility] Assessment Services) Patient's cognitive ability adequate to safely complete daily activities?: Yes Patient able to express need for assistance with ADLs?: Yes Independently performs ADLs?: Yes (appropriate for developmental age)  Prior Inpatient Therapy Prior Inpatient Therapy: No Prior Therapy Dates: N/A Prior Therapy Facilty/Provider(s): N/A Reason for Treatment: N/A  Prior Outpatient Therapy Prior Outpatient Therapy: No Prior Therapy Dates: N/A Prior Therapy  Facilty/Provider(s): n/A Reason for Treatment: N/A  ADL Screening (condition at time of admission) Patient's cognitive ability adequate to safely complete daily activities?: Yes Is the patient deaf or have difficulty hearing?: No Does the patient have difficulty seeing, even when wearing glasses/contacts?: No Does the patient have difficulty concentrating, remembering, or making decisions?: No Patient able to express need for assistance with ADLs?: Yes Does the patient have difficulty dressing or bathing?: No Independently performs ADLs?: Yes (appropriate for developmental age) Does the patient have difficulty walking or climbing stairs?: No Weakness of Legs: None Weakness of Arms/Hands: None  Home Assistive Devices/Equipment Home Assistive Devices/Equipment: None    Abuse/Neglect Assessment (Assessment to be complete while patient is alone) Physical Abuse: Denies Verbal Abuse: Denies Sexual Abuse: Denies Exploitation of patient/patient's resources: Denies Self-Neglect: Denies Values / Beliefs Cultural Requests During Hospitalization: None Spiritual Requests During Hospitalization: None   Advance Directives (For Healthcare) Does patient have an advance directive?: No Would patient like information on creating an advanced directive?: No - patient declined information    Additional Information 1:1 In Past 12 Months?: No CIRT Risk: No Elopement Risk: No Does patient have medical clearance?: Yes     Disposition:  Disposition Initial Assessment Completed for this Encounter: Yes Disposition of Patient: Other dispositions (OBS)  On Site Evaluation by:   Reviewed with Physician:    Dey-Johnson,Keithen Capo 10/19/2013 6:47 PM

## 2013-10-19 NOTE — ED Notes (Signed)
Called IV[facility] with BH for TTS consult re-try for pt now that he has eaten and is awake.

## 2013-10-19 NOTE — Plan of Care (Signed)
[facility] Observation Crisis Plan  Reason for Crisis Plan:  Substance Abuse   Plan of Care:  Referral for Substance Abuse  Family Support:      Current Living Environment:  Living Arrangements: Spouse/significant other  Insurance:   Hospital Account   Name Acct ID Class Status Primary Coverage   [name], [name] [id number] BEHAVIORAL HEALTH OBSERVATION Open None        Guarantor Account (for Hospital Account [id number])   Name Relation to Pt Service Area Active? Acct Type   [name] Self [facility]   Address Phone       [address] [place], [place] [address] [phone](H)          Coverage Information (for Hospital Account [id number])   Not on file      Legal Guardian:   self  Primary Care Provider:  No PCP Per Patient  Current Outpatient Providers:  ACT  Psychiatrist:     Counselor/Therapist:     Compliant with Medications:  Yes  Additional Information:   [name] 9/19/201510:57 PM

## 2013-10-19 NOTE — ED Notes (Signed)
Pt placed into gown and on monitor upon arrival to room. Pt monitored by blood pressure, pulse ox, and 12 lead.  

## 2013-10-19 NOTE — BH Assessment (Signed)
Consult with Dr. Jama Flavors about PT's disposition.  Per Dr. Jama Flavors  transfer PT toOBS.  Disposition provided to Dr. Wilber Bihari.  Per Tanna Savoy PT will go to OBS bed 6.

## 2013-10-19 NOTE — ED Notes (Signed)
TTS consult completed 

## 2013-10-19 NOTE — ED Notes (Addendum)
Pt reports to the ED for eval of ETOH abuse. He is requesting detox. Pt reports he has had "eighteen 40s" this morning. Pt reports he has been drinking heavily since he was 48 years old. Reports he has tried to quit several times but has been unsuccessful. He has been drinking heavily this time for 6 months. Pt denies any HI/SI. Pt reports he is hearing voices but he cannot understand them. Denies any auditory hallucinations. Pt reports his last drink was after 1300 today. Pt also had an anxiety attack en route. Lung sounds clear. Pt A&Ox4, resp e/u, and skin warm and dry.

## 2013-10-19 NOTE — ED Notes (Signed)
TTS tried to assess client but he was unable to stay awake.  Counselor will call back in 1 hour.

## 2013-10-19 NOTE — ED Notes (Signed)
Call received from [facility] that their Extender has seen faxed EKG's and it is OK to send the Pt. Over to their Obs. Unit.

## 2013-10-19 NOTE — BH Assessment (Signed)
Unable to perform tele-assessment at this time, PT is drowsy and an not alert to answer questions.  Nurse Ninetta Lights will notify when PT becomes alert to complete tele-assessment.

## 2013-10-19 NOTE — ED Notes (Signed)
[facility] from [facility] called and stated that the extended wanted an EKG due to Pt.'s hx. Of A-fib.

## 2013-10-20 NOTE — Progress Notes (Signed)
D Pt. Denies SI and HI, pt rates his depression at a 7 and his anxiety at an 8.    A Writer offered support and encouragement,  Discussed discharge plans with pt.  R Pt. Remains safe on the unit.  Some tremors noted on assessment,  Pt. Reports that he hopes to be transferred to the adult unit to continue detox and then start attending AA meetings.  Pt. Reports he has to get help because he is getting married in a couple of weeks.

## 2013-10-20 NOTE — H&P (Signed)
OBS UNIT HPI/SRA  I have reviewed and assessed this chart, and I agree with the findings listed below.   [name] is an 48 y.o. Male.   PT reported coming to [facility] for for ETOH detox and inpt tx. PT reported consuming 14 (12 oz) beers this morning and experiencing SI w/no plans and AH. He denied current SI, HI, AH, and VH. He reported experiencing AH after becoming intoxicated on ETOH. PT reported daily consumption of 17 beers w/periodic black outs for several yrs. He reported no period of abstinence. He reported consuming a beer upon awakening so not to experience w/ds. PT reported previous residential SA tx in 38s and reported no current SA treatment. PT is requesting inpt SA tx.   Axis I: Depressive Disorder NOS and Alcohol Use Disorder, severe  Axis II: Deferred  Axis IV: problems related to social environment and problems with access to health care services  Axis V: 31-40 impairment in reality testing   Past Medical History:  Past Medical History   Diagnosis  Date   .  Hypertension    .  Alcoholism /alcohol abuse    .  Anxiety and depression    .  Tobacco abuse    .  Atrial fibrillation     Past Surgical History   Procedure  Laterality  Date   .  Hernia repair       UHR x2    Family History:  Family History   Problem  Relation  Age of Onset   .  Heart attack  Mother    .  Heart attack  Father    .  Hypertension  Mother    .  Hypertension  Father     Social History: reports that he has been smoking Cigarettes. He has a 30 pack-year smoking history. He has never used smokeless tobacco. He reports that he drinks alcohol. He reports that he does not use illicit drugs.   Additional Social History:  CIWA: CIWA-Ar  BP: 105/89 mmHg  Pulse Rate: 118  Nausea and Vomiting: mild nausea with no vomiting  Tactile Disturbances: none  Tremor: moderate, with patient's arms extended  Auditory Disturbances: moderately severe hallucinations  Paroxysmal Sweats: no sweat visible   Visual Disturbances: not present  Anxiety: moderately anxious, or guarded, so anxiety is inferred  Headache, Fullness in Head: none present  Agitation: normal activity  Orientation and Clouding of Sensorium: oriented and can do serial additions  CIWA-Ar Total: 13  COWS:  Allergies:  Allergies   Allergen  Reactions   .  Bee Venom  Anaphylaxis     Uses epi pen-- last used 08/23/11    Home Medications:  (Not in a hospital admission)  OB/GYN Status: No LMP for male patient.  General Assessment Data  [facility] of Assessment: [facility]  ACT Assessment: Yes  Is this a Tele or Face-to-Face Assessment?: Tele Assessment  Is this an Initial Assessment or a Re-assessment for this encounter?: Initial Assessment  Living Arrangements: Spouse/significant other  Can pt return to current living arrangement?: Yes  Is patient capable of signing voluntary admission?: Yes  Transfer from: Home  Referral Source: Self/Family/Friend  Medical Screening Exam [facility] Walk-in ONLY)  Medical Exam completed: Yes  [facility] Crisis Care Plan  Living Arrangements: Spouse/significant other  Name of Psychiatrist: None  Name of Therapist: None  Education Status  Is patient currently in school?: No  Current Grade: N/A  Highest grade of school patient has completed: N/A  Name  of school: N/A  Contact person: N/A  Risk to self with the past 6 months  Suicidal Ideation: No-Not Currently/Within Last 6 Months (PT reported this morning, but not currently)  Suicidal Intent: No  Is patient at risk for suicide?: No  Suicidal Plan?: No  Access to Means: No  What has been your use of drugs/alcohol within the last 12 months?: daily Alcohol consumption  Previous Attempts/Gestures: No  How many times?: 0  Other Self Harm Risks: None  Triggers for Past Attempts: None known  Intentional Self Injurious Behavior: None  Family Suicide History: Unknown  Recent stressful life event(s): Other (Comment) (chronic alcohol  consumption)  Persecutory voices/beliefs?: No  Depression: Yes  Depression Symptoms: Feeling worthless/self pity  Substance abuse history and/or treatment for substance abuse?: Yes (ETOH, last use today, "drank 18-40s", been drinking heavily for 60 months, denies any other substance use)  Suicide prevention information given to non-admitted patients: Not applicable  Risk to Others within the past 6 months  Homicidal Ideation: No  Thoughts of Harm to Others: No  Current Homicidal Intent: No  Current Homicidal Plan: No  Access to Homicidal Means: No  Identified Victim: N/A  History of harm to others?: No  Assessment of Violence: None Noted  Violent Behavior Description: N/A  Does patient have access to weapons?: No  Criminal Charges Pending?: No  Does patient have a court date: No  Psychosis  Hallucinations: None noted  Delusions: None noted  Mental Status Report  Appear/Hygiene: In hospital gown  Eye Contact: Poor  Motor Activity: Other (Comment) (PT drowsy)  Speech: Logical/coherent;Slow  Level of Consciousness: Drowsy  Mood: Depressed;Sad  Affect: Flat (drowsy)  Anxiety Level: Panic Attacks (PT reported panic attack on way to ED)  Panic attack frequency: Unknown  Most recent panic attack: Today  Thought Processes: Coherent  Judgement: Impaired  Orientation: Person;Place  Obsessive Compulsive Thoughts/Behaviors: None  Cognitive Functioning  Concentration: Poor  Memory: Recent Intact;Remote Intact  IQ: Average  Insight: Poor  Impulse Control: Poor  Appetite: Poor  Weight Loss: 0  Weight Gain: 0  Sleep: Decreased  Total Hours of Sleep: 6  Vegetative Symptoms: None  ADLScreening [facility])  Patient's cognitive ability adequate to safely complete daily activities?: Yes  Patient able to express need for assistance with ADLs?: Yes  Independently performs ADLs?: Yes (appropriate for developmental age)  Prior Inpatient Therapy  Prior Inpatient Therapy: No   Prior Therapy Dates: N/A  Prior Therapy Facilty/Provider(s): N/A  Reason for Treatment: N/A  Prior Outpatient Therapy  Prior Outpatient Therapy: No  Prior Therapy Dates: N/A  Prior Therapy Facilty/Provider(s): n/A  Reason for Treatment: N/A  ADL Screening (condition at time of admission)  Patient's cognitive ability adequate to safely complete daily activities?: Yes  Is the patient deaf or have difficulty hearing?: No  Does the patient have difficulty seeing, even when wearing glasses/contacts?: No  Does the patient have difficulty concentrating, remembering, or making decisions?: No  Patient able to express need for assistance with ADLs?: Yes  Does the patient have difficulty dressing or bathing?: No  Independently performs ADLs?: Yes (appropriate for developmental age)  Does the patient have difficulty walking or climbing stairs?: No  Weakness of Legs: None  Weakness of Arms/Hands: None  Home Assistive Devices/Equipment  Home Assistive Devices/Equipment: None   Abuse/Neglect Assessment (Assessment to be complete while patient is alone)  Physical Abuse: Denies  Verbal Abuse: Denies  Sexual Abuse: Denies  Exploitation of patient/patient's resources: Denies  Self-Neglect:  Denies  Values / Beliefs  Cultural Requests During Hospitalization: None  Spiritual Requests During Hospitalization: None   Advance Directives (For Healthcare)  Does patient have an advance directive?: No  Would patient like information on creating an advanced directive?: No - patient declined information   Additional Information  1:1 In Past 12 Months?: No  CIRT Risk: No  Elopement Risk: No  Does patient have medical clearance?: Yes  Treatment plan: Will admit to observation unit for medication management, crisis stabilization, and alcohol detox. Daily monitoring and assessing of current medical condition. Will monitor for DT and alcohol withdrawal symptoms.   I agreed with the findings, treatment and  disposition plan of this patient. Kathryne Sharper, MD

## 2013-10-20 NOTE — Progress Notes (Signed)
Patient ID: [name], male   DOB: 05-08-1965, 48 y.o.   MRN: [id number] Patient resting in bed at this time; no s/s of distress noted. Respirations regular and unlabored.

## 2013-10-20 NOTE — Progress Notes (Signed)
Patient ID: [name], male   DOB: 1965-10-01, 48 y.o.   MRN: [id number] D:  Patient resting quietly.  Patient ate majority of his lunch.  He received 1 mg. Ativan at 1200 for withdrawal symptoms.  His CIWA is a 7. Patient complains of nausea, cramping and body aches.  Patient's goal is to be admitted to the unit for further treatment.  Patient called his ACT team this morning.  He would also like to notify his MD of his plan to be admitted.  Informed patient this writer will look the number up for him, so he can call him tomorrow if needed.  He denies any SI/HI/AVH. A: Continue to monitor medication management and MD orders.  Monitor withdrawal symptoms and medication as necessary. R: Patient is cooperative; his behavior is appropriate.

## 2013-10-21 DIAGNOSIS — F4310 Post-traumatic stress disorder, unspecified: Secondary | ICD-10-CM

## 2013-10-21 DIAGNOSIS — F329 Major depressive disorder, single episode, unspecified: Secondary | ICD-10-CM

## 2013-10-21 DIAGNOSIS — F1020 Alcohol dependence, uncomplicated: Secondary | ICD-10-CM

## 2013-10-21 DIAGNOSIS — F1994 Other psychoactive substance use, unspecified with psychoactive substance-induced mood disorder: Secondary | ICD-10-CM

## 2013-10-21 NOTE — Progress Notes (Signed)
D:Pt denies any detox symptoms this morning. He reports that he wants to leave [facility] today and follow up with OP.  A:Offered support and 15 minute checks. R:Pt denies si and hi. He denies hallucinations. Safety maintained on the observation unit.

## 2013-10-21 NOTE — [facility] Suicide Risk Assessment (Signed)
Suicide Risk Assessment  Discharge Assessment     Demographic Factors:  Male and Caucasian  Total Time spent with patient: 30 minutes  Psychiatric Specialty Exam:     Blood pressure 104/59, pulse 67, temperature 98.2 F (36.8 C), temperature source Oral, resp. rate 16, height 5\' 6"  (1.676 m), weight 91.627 kg (202 lb), SpO2 99.00%.Body mass index is 32.62 kg/(m^2).  General Appearance: Fairly Groomed  Patent attorney::  Fair  Speech:  Clear and Coherent  Volume:  Normal  Mood:  Anxious  Affect:  Appropriate  Thought Process:  Coherent and Goal Directed  Orientation:  Full (Time, Place, and Person)  Thought Content:  worries, concerns, plans as he moves on,relapse prevention plan  Suicidal Thoughts:  No  Homicidal Thoughts:  No  Memory:  Immediate;   Fair Recent;   Fair Remote;   Fair  Judgement:  Fair  Insight:  Present  Psychomotor Activity:  Normal  Concentration:  Fair  Recall:  [employer] of Knowledge:NA  Language: Fair  Akathisia:  No  Handed:    AIMS (if indicated):     Assets:  Desire for Improvement Housing Social Support Vocational/Educational  Sleep:       Musculoskeletal: Strength & Muscle Tone: within normal limits Gait & Station: normal Patient leans: N/A   Mental Status Per Nursing Assessment::   On Admission:     Current Mental Status by Physician: In full contact with reality. There are no active S/S of withdrawal. There are no active SI plans or intent. He is motivated to pursue long term sobriety as he has a baby on the way. He is still employed full time doing land scapping   Loss Factors: NA  Historical Factors: NA  Risk Reduction Factors:   Sense of responsibility to family, Employed and Positive social support  Continued Clinical Symptoms:  Alcohol/Substance Abuse/Dependencies  Cognitive Features That Contribute To Risk:  Polarized thinking Thought constriction (tunnel vision)    Suicide Risk:  Minimal: No identifiable  suicidal ideation.  Patients presenting with no risk factors but with morbid ruminations; may be classified as minimal risk based on the severity of the depressive symptoms  Discharge Diagnoses:   AXIS I:  Alcohol Dependence, S/P alcohol withdrawal AXIS II:  No diagnosis AXIS III:   Past Medical History  Diagnosis Date  . Hypertension   . Alcoholism /alcohol abuse   . Anxiety and depression   . Tobacco abuse   . Atrial fibrillation    AXIS IV:  other psychosocial or environmental problems AXIS V:  61-70 mild symptoms  Plan Of Care/Follow-up recommendations:  Activity:  as tolerated Diet:  regular Follow up [facility] Is patient on multiple antipsychotic therapies at discharge:  No   Has Patient had three or more failed trials of antipsychotic monotherapy by history:  No  Recommended Plan for Multiple Antipsychotic Therapies: NA    Jolleen Seman A 10/21/2013, 12:54 PM

## 2013-10-21 NOTE — Progress Notes (Signed)
Patient ID: [name], male   DOB: [birth year], 48 y.o.   MRN: [id number] Discharge Note-Seen by Dr. Dub Mikes this am and he determined that he can be discharged to day and client wants to go home.His girlfriend is now admitted to [facility], he says "she always is wanting to be babyfied" He states hes going to sit her down and tell her she has to straighten up or he is going back to [place] and get back together with his ex wife. He denies any thoughts to hurt self. And states he is actually feeling pretty good today. Reviewed his discharge plans which are to follow up with [facility], plans to go thru their walk in clinic tomorrow between 8a and 3pm. All of his property was returned to him and he changed clothes and planned to go from here to the hospital to see his girlfriend because she has some of his needed property.
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
## 2013-10-30 NOTE — ED Provider Notes (Signed)
Medical screening examination/treatment/procedure(s) were performed by non-physician practitioner and as supervising physician I was immediately available for consultation/collaboration.   EKG Interpretation None       Hurman Horn, MD 10/30/13 1327

## 2014-07-02 ENCOUNTER — Emergency Department (HOSPITAL_COMMUNITY): Payer: Self-pay

## 2014-07-02 ENCOUNTER — Inpatient Hospital Stay (HOSPITAL_COMMUNITY)
Admission: EM | Admit: 2014-07-02 | Discharge: 2014-07-08 | DRG: 291 | Disposition: A | Discharge status: Home / Self Care | Payer: Self-pay | Attending: Family Medicine | Admitting: Family Medicine

## 2014-07-02 ENCOUNTER — Encounter (HOSPITAL_COMMUNITY): Payer: Self-pay | Admitting: *Deleted

## 2014-07-02 DIAGNOSIS — Z7982 Long term (current) use of aspirin: Secondary | ICD-10-CM

## 2014-07-02 DIAGNOSIS — R079 Chest pain, unspecified: Secondary | ICD-10-CM | POA: Diagnosis present

## 2014-07-02 DIAGNOSIS — I5021 Acute systolic (congestive) heart failure: Principal | ICD-10-CM | POA: Diagnosis present

## 2014-07-02 DIAGNOSIS — I10 Essential (primary) hypertension: Secondary | ICD-10-CM | POA: Diagnosis present

## 2014-07-02 DIAGNOSIS — J441 Chronic obstructive pulmonary disease with (acute) exacerbation: Secondary | ICD-10-CM | POA: Diagnosis present

## 2014-07-02 DIAGNOSIS — E860 Dehydration: Secondary | ICD-10-CM | POA: Diagnosis present

## 2014-07-02 DIAGNOSIS — I4891 Unspecified atrial fibrillation: Secondary | ICD-10-CM | POA: Diagnosis present

## 2014-07-02 DIAGNOSIS — R740 Nonspecific elevation of levels of transaminase and lactic acid dehydrogenase [LDH]: Secondary | ICD-10-CM | POA: Diagnosis present

## 2014-07-02 DIAGNOSIS — F419 Anxiety disorder, unspecified: Secondary | ICD-10-CM | POA: Diagnosis present

## 2014-07-02 DIAGNOSIS — R1011 Right upper quadrant pain: Secondary | ICD-10-CM | POA: Diagnosis present

## 2014-07-02 DIAGNOSIS — K819 Cholecystitis, unspecified: Secondary | ICD-10-CM

## 2014-07-02 DIAGNOSIS — Z9119 Patient's noncompliance with other medical treatment and regimen: Secondary | ICD-10-CM | POA: Diagnosis present

## 2014-07-02 DIAGNOSIS — F1021 Alcohol dependence, in remission: Secondary | ICD-10-CM | POA: Diagnosis present

## 2014-07-02 DIAGNOSIS — I426 Alcoholic cardiomyopathy: Secondary | ICD-10-CM | POA: Diagnosis present

## 2014-07-02 DIAGNOSIS — I340 Nonrheumatic mitral (valve) insufficiency: Secondary | ICD-10-CM

## 2014-07-02 DIAGNOSIS — J9601 Acute respiratory failure with hypoxia: Secondary | ICD-10-CM | POA: Diagnosis present

## 2014-07-02 DIAGNOSIS — Z8249 Family history of ischemic heart disease and other diseases of the circulatory system: Secondary | ICD-10-CM

## 2014-07-02 DIAGNOSIS — I959 Hypotension, unspecified: Secondary | ICD-10-CM | POA: Diagnosis present

## 2014-07-02 DIAGNOSIS — Z87891 Personal history of nicotine dependence: Secondary | ICD-10-CM

## 2014-07-02 DIAGNOSIS — K812 Acute cholecystitis with chronic cholecystitis: Secondary | ICD-10-CM | POA: Diagnosis present

## 2014-07-02 DIAGNOSIS — I5023 Acute on chronic systolic (congestive) heart failure: Secondary | ICD-10-CM

## 2014-07-02 DIAGNOSIS — I4892 Unspecified atrial flutter: Secondary | ICD-10-CM | POA: Diagnosis present

## 2014-07-02 DIAGNOSIS — Z590 Homelessness: Secondary | ICD-10-CM

## 2014-07-02 DIAGNOSIS — N179 Acute kidney failure, unspecified: Secondary | ICD-10-CM | POA: Diagnosis present

## 2014-07-02 DIAGNOSIS — F329 Major depressive disorder, single episode, unspecified: Secondary | ICD-10-CM | POA: Diagnosis present

## 2014-07-02 LAB — CBC WITH DIFFERENTIAL/PLATELET
BASOS ABS: 0.1 10*3/uL (ref 0.0–0.1)
BASOS PCT: 1 % (ref 0–1)
Eosinophils Absolute: 0.1 10*3/uL (ref 0.0–0.7)
Eosinophils Relative: 2 % (ref 0–5)
HCT: 45.1 % (ref 39.0–52.0)
HEMOGLOBIN: 14.9 g/dL (ref 13.0–17.0)
LYMPHS ABS: 2.9 10*3/uL (ref 0.7–4.0)
Lymphocytes Relative: 38 % (ref 12–46)
MCH: 32 pg (ref 26.0–34.0)
MCHC: 33 g/dL (ref 30.0–36.0)
MCV: 97 fL (ref 78.0–100.0)
MONO ABS: 0.8 10*3/uL (ref 0.1–1.0)
Monocytes Relative: 11 % (ref 3–12)
NEUTROS PCT: 48 % (ref 43–77)
Neutro Abs: 3.6 10*3/uL (ref 1.7–7.7)
Platelets: 176 10*3/uL (ref 150–400)
RBC: 4.65 MIL/uL (ref 4.22–5.81)
RDW: 13.7 % (ref 11.5–15.5)
WBC: 7.6 10*3/uL (ref 4.0–10.5)

## 2014-07-02 LAB — PROTIME-INR
INR: 1.41 (ref 0.00–1.49)
PROTHROMBIN TIME: 17.4 s — AB (ref 11.6–15.2)

## 2014-07-02 LAB — I-STAT CHEM 8, ED
BUN: 27 mg/dL — ABNORMAL HIGH (ref 6–20)
Calcium, Ion: 1.22 mmol/L (ref 1.12–1.23)
Chloride: 108 mmol/L (ref 101–111)
Creatinine, Ser: 1.1 mg/dL (ref 0.61–1.24)
Glucose, Bld: 91 mg/dL (ref 65–99)
HEMATOCRIT: 50 % (ref 39.0–52.0)
Hemoglobin: 17 g/dL (ref 13.0–17.0)
Potassium: 4.2 mmol/L (ref 3.5–5.1)
Sodium: 142 mmol/L (ref 135–145)
TCO2: 18 mmol/L (ref 0–100)

## 2014-07-02 LAB — RAPID URINE DRUG SCREEN, HOSP PERFORMED
Amphetamines: NOT DETECTED
BENZODIAZEPINES: NOT DETECTED
Barbiturates: NOT DETECTED
COCAINE: NOT DETECTED
OPIATES: NOT DETECTED
TETRAHYDROCANNABINOL: NOT DETECTED

## 2014-07-02 LAB — MAGNESIUM: Magnesium: 2 mg/dL (ref 1.7–2.4)

## 2014-07-02 LAB — ETHANOL

## 2014-07-02 LAB — I-STAT TROPONIN, ED: TROPONIN I, POC: 0.04 ng/mL (ref 0.00–0.08)

## 2014-07-02 MED ORDER — MAGNESIUM SULFATE 2 GM/50ML IV SOLN
2.0000 g | Freq: Once | INTRAVENOUS | Status: AC
  Administered 2014-07-02: 2 g via INTRAVENOUS
  Filled 2014-07-02: qty 50

## 2014-07-02 MED ORDER — SODIUM CHLORIDE 0.9 % IV BOLUS (SEPSIS)
1000.0000 mL | Freq: Once | INTRAVENOUS | Status: AC
  Administered 2014-07-02: 1000 mL via INTRAVENOUS

## 2014-07-02 MED ORDER — DILTIAZEM HCL 25 MG/5ML IV SOLN
20.0000 mg | Freq: Once | INTRAVENOUS | Status: AC
  Administered 2014-07-02: 20 mg via INTRAVENOUS
  Filled 2014-07-02: qty 5

## 2014-07-02 NOTE — ED Notes (Signed)
Pt states that he has felt short of breath, weak and had a NP cough for the last 4 days;  Pt c/o increase shortness of breath upon exertion; pt became more short of breath when ambulated from lobby to room; pt states that he can feel "stuff" in his throat ans lungs is just unable to get it up

## 2014-07-02 NOTE — ED Provider Notes (Signed)
CSN: [id number]     Arrival date & time 07/02/14  2048 History   First MD Initiated Contact with Patient 07/02/14 2143     Chief Complaint  Patient presents with  . Shortness of Breath     (Consider location/radiation/quality/duration/timing/severity/associated sxs/prior Treatment) HPI  Lelyn Okonek is a 49 y.o. male with past medical history of hypertension, alcohol abuse, atrial fibrillation on a liquid presenting today with chest pain or shortness of breath. Patient states this is going on for the past 4 days. He's had dyspnea on exertion and nonproductive cough as well. He denies fevers or chills. His history of noncompliance and alcohol abuse. He states he has not drank for the past 5 months. He also has been out of his medication for the past year. Unable to tell me why he is not taking his medications. He denies recent fevers or infections. Patient has no further complaints.  10 Systems reviewed and are negative for acute change except as noted in the HPI.    Past Medical History  Diagnosis Date  . Hypertension   . Alcoholism /alcohol abuse   . Anxiety and depression   . Tobacco abuse   . Atrial fibrillation    Past Surgical History  Procedure Laterality Date  . Hernia repair      UHR x2   Family History  Problem Relation Age of Onset  . Heart attack Mother   . Heart attack Father   . Hypertension Mother   . Hypertension Father    History  Substance Use Topics  . Smoking status: Former Smoker -- 1.00 packs/day for 30 years    Types: Cigarettes    Quit date: 05/02/2014  . Smokeless tobacco: Never Used  . Alcohol Use: Yes     Comment: pt states none in 5 months (01/2014)    Review of Systems    Allergies  Bee venom  Home Medications   Prior to Admission medications   Medication Sig Start Date End Date Taking? Authorizing Provider  aspirin 325 MG tablet Take 325-650 mg by mouth daily as needed for mild pain.   Yes Historical Provider, MD   BP 100/85 mmHg   Pulse 88  Temp(Src) 98.7 F (37.1 C) (Oral)  Resp 20  Ht 5\' 6"  (1.676 m)  Wt 160 lb (72.576 kg)  BMI 25.84 kg/m2  SpO2 95% Physical Exam  Constitutional: He is oriented to person, place, and time. Vital signs are normal. He appears well-developed and well-nourished.  Non-toxic appearance. He does not appear ill. No distress.  HENT:  Head: Normocephalic and atraumatic.  Nose: Nose normal.  Mouth/Throat: Oropharynx is clear and moist. No oropharyngeal exudate.  Eyes: Conjunctivae and EOM are normal. Pupils are equal, round, and reactive to light. No scleral icterus.  Neck: Normal range of motion. Neck supple. No tracheal deviation, no edema, no erythema and normal range of motion present. No thyroid mass and no thyromegaly present.  Cardiovascular: Regular rhythm, S1 normal, S2 normal, normal heart sounds, intact distal pulses and normal pulses.  Exam reveals no gallop and no friction rub.   No murmur heard. Pulses:      Radial pulses are 2+ on the right side, and 2+ on the left side.       Dorsalis pedis pulses are 2+ on the right side, and 2+ on the left side.  Irregularly irregular rhythm, tachycardia.  Pulmonary/Chest: Effort normal and breath sounds normal. No respiratory distress. He has no wheezes. He has no rhonchi. He  has no rales.  Abdominal: Soft. Normal appearance and bowel sounds are normal. He exhibits no distension, no ascites and no mass. There is no hepatosplenomegaly. There is no tenderness. There is no rebound, no guarding and no CVA tenderness.  Musculoskeletal: Normal range of motion. He exhibits no edema or tenderness.  Lymphadenopathy:    He has no cervical adenopathy.  Neurological: He is alert and oriented to person, place, and time. He has normal strength. No cranial nerve deficit or sensory deficit. He exhibits normal muscle tone.  Skin: Skin is warm, dry and intact. No petechiae and no rash noted. He is not diaphoretic. No erythema. No pallor.  Nursing note  and vitals reviewed.   ED Course  Procedures (including critical care time) Labs Review Labs Reviewed  PROTIME-INR - Abnormal; Notable for the following:    Prothrombin Time 17.4 (*)    All other components within normal limits  I-STAT CHEM 8, ED - Abnormal; Notable for the following:    BUN 27 (*)    All other components within normal limits  CBC WITH DIFFERENTIAL/PLATELET  ETHANOL  MAGNESIUM  URINALYSIS, ROUTINE W REFLEX MICROSCOPIC (NOT AT [facility])  URINE RAPID DRUG SCREEN (HOSP PERFORMED) NOT AT [facility]  BRAIN NATRIURETIC PEPTIDE  I-STAT TROPOININ, ED    Imaging Review Dg Chest 2 View  07/02/2014   CLINICAL DATA:  Shortness of breath, weakness, nonproductive cough for 4 days, increasing.  EXAM: CHEST  2 VIEW  COMPARISON:  06/23/2013  FINDINGS: Cardiomegaly is unchanged. Peribronchial and interstitial opacities are unchanged, may reflect pulmonary edema, however appears chronic. The previous right lower lobe consolidation has diminished, however minimal residual opacities persist. No new consolidation. There may be small pleural effusions. No acute osseous abnormalities.  IMPRESSION: 1. Right basilar atelectasis or scarring. 2. Stable cardiomegaly. Interstitial opacities concerning for pulmonary edema, however this may be chronic and is unchanged compared to prior.   Electronically Signed   By: Rubye Oaks M.D.   On: 07/02/2014 22:32     EKG Interpretation   Date/Time:  Wednesday July 02 2014 21:31:40 EDT Ventricular Rate:  147 PR Interval:    QRS Duration: 82 QT Interval:  316 QTC Calculation: 494 R Axis:   82 Text Interpretation:  Atrial fibrillation No significant change since last  tracing Confirmed by Erroll Luna [phone]) on 07/02/2014 9:43:54 PM      MDM   Final diagnoses:  Chest pain   patient presents emergency department for chest pain shortness of breath. He is currently in A. fib with RVR. He has presented like this multiple times in the past. He was  given 20 mg of diltiazem, 2 mg of magnesium, 1 L bolus. Upon repeat evaluation of the patient, his pulse has decreased back down to the 80s. He symptomatically feels better as well. Troponin is negative, EKG shows possible fluid in the lungs. Will call Dr. Sharyn Lull for admission for chest pain rule out and to reestablish his care.  Dr. Marni Griffon recommends for the patient to be admitted to [facility] hospital[facility]. He'll see the patient in the morning. I spoke with [facility] who will admit to tele unit.  CRITICAL CARE Performed by: Tomasita Crumble   Total critical care time: 35 min. A fib with RVR. dilt given  Critical care time was exclusive of separately billable procedures and treating other patients.  Critical care was necessary to treat or prevent imminent or life-threatening deterioration.  Critical care was time spent personally by me on the following activities: development  of treatment plan with patient and/or surrogate as well as nursing, discussions with consultants, evaluation of patient's response to treatment, examination of patient, obtaining history from patient or surrogate, ordering and performing treatments and interventions, ordering and review of laboratory studies, ordering and review of radiographic studies, pulse oximetry and re-evaluation of patient's condition.    Tomasita Crumble, MD 07/02/14 2325

## 2014-07-02 NOTE — Progress Notes (Signed)
EDCM spoke to patient at bedside. Patient confirms he does not have a pcp or insurance living in [place].  [facility] provided patient with pamphlet to [facility], informed patient of services there and walk in times.  EDCM also provided patient with list of pcps who accept self pay patients, list of discount pharmacies and websites needymeds.org and GoodRX.com for medication assistance, phone number to inquire about the orange card, phone number to inquire about [facility], phone number to inquire about the Affordable Care Act, financial resources in the community such as local churches, [employer], urban ministries, and dental assistance for uninsured patients.  Patient reports he used to have [facility].  Patient reports he was at the [facility] who provided him with a orange card application which patient reports he has filled out and gave back to [facility].  Patient reports he is not currently homeless and is living with his uncle.  Patient reports he has a fiance' and a daughter.  Patient states, "I was on a pill because my heart flutters.  I haven't taken it in least a year."  [facility] asked patient why he stopped taking his medication?  Patient responded, "Because I wasn't being smart enough.  I didn't want to stop my bad habits of smoking and drinking.  But now I know I have to take my meds and stay on them."   Patient thankful for resources.  No further EDCM needs at this time.

## 2014-07-03 ENCOUNTER — Inpatient Hospital Stay (HOSPITAL_COMMUNITY): Payer: Self-pay

## 2014-07-03 ENCOUNTER — Encounter (HOSPITAL_COMMUNITY): Payer: Self-pay | Admitting: Internal Medicine

## 2014-07-03 DIAGNOSIS — J441 Chronic obstructive pulmonary disease with (acute) exacerbation: Secondary | ICD-10-CM | POA: Diagnosis present

## 2014-07-03 DIAGNOSIS — R1011 Right upper quadrant pain: Secondary | ICD-10-CM | POA: Diagnosis present

## 2014-07-03 DIAGNOSIS — R079 Chest pain, unspecified: Secondary | ICD-10-CM | POA: Insufficient documentation

## 2014-07-03 LAB — CBC
HCT: 44.2 % (ref 39.0–52.0)
HEMOGLOBIN: 14.7 g/dL (ref 13.0–17.0)
MCH: 32.9 pg (ref 26.0–34.0)
MCHC: 33.3 g/dL (ref 30.0–36.0)
MCV: 98.9 fL (ref 78.0–100.0)
PLATELETS: 166 10*3/uL (ref 150–400)
RBC: 4.47 MIL/uL (ref 4.22–5.81)
RDW: 13.9 % (ref 11.5–15.5)
WBC: 7 10*3/uL (ref 4.0–10.5)

## 2014-07-03 LAB — HEPARIN LEVEL (UNFRACTIONATED)
Heparin Unfractionated: 0.24 IU/mL — ABNORMAL LOW (ref 0.30–0.70)
Heparin Unfractionated: 0.45 IU/mL (ref 0.30–0.70)
Heparin Unfractionated: 0.41 IU/mL (ref 0.30–0.70)

## 2014-07-03 LAB — URINALYSIS, ROUTINE W REFLEX MICROSCOPIC
BILIRUBIN URINE: NEGATIVE
GLUCOSE, UA: NEGATIVE mg/dL
Hgb urine dipstick: NEGATIVE
Ketones, ur: NEGATIVE mg/dL
Leukocytes, UA: NEGATIVE
Nitrite: NEGATIVE
Protein, ur: 30 mg/dL — AB
SPECIFIC GRAVITY, URINE: 1.024 (ref 1.005–1.030)
UROBILINOGEN UA: 0.2 mg/dL (ref 0.0–1.0)
pH: 5 (ref 5.0–8.0)

## 2014-07-03 LAB — APTT: aPTT: 35 seconds (ref 24–37)

## 2014-07-03 LAB — HEPATIC FUNCTION PANEL
ALT: 111 U/L — AB (ref 17–63)
AST: 109 U/L — AB (ref 15–41)
Albumin: 3.4 g/dL — ABNORMAL LOW (ref 3.5–5.0)
Alkaline Phosphatase: 83 U/L (ref 38–126)
BILIRUBIN TOTAL: 0.7 mg/dL (ref 0.3–1.2)
Bilirubin, Direct: 0.2 mg/dL (ref 0.1–0.5)
Indirect Bilirubin: 0.5 mg/dL (ref 0.3–0.9)
Total Protein: 6.2 g/dL — ABNORMAL LOW (ref 6.5–8.1)

## 2014-07-03 LAB — TROPONIN I
TROPONIN I: 0.03 ng/mL (ref <0.031)
TROPONIN I: 0.04 ng/mL — AB (ref <0.031)
Troponin I: 0.04 ng/mL — ABNORMAL HIGH (ref <0.031)
Troponin I: 0.03 ng/mL (ref <0.031)

## 2014-07-03 LAB — T4, FREE: Free T4: 0.95 ng/dL (ref 0.61–1.12)

## 2014-07-03 LAB — URINE MICROSCOPIC-ADD ON

## 2014-07-03 LAB — MRSA PCR SCREENING: MRSA by PCR: NEGATIVE

## 2014-07-03 LAB — LIPASE, BLOOD: Lipase: 16 U/L — ABNORMAL LOW (ref 22–51)

## 2014-07-03 LAB — BRAIN NATRIURETIC PEPTIDE: B NATRIURETIC PEPTIDE 5: 873.2 pg/mL — AB (ref 0.0–100.0)

## 2014-07-03 LAB — TSH: TSH: 3.175 u[IU]/mL (ref 0.350–4.500)

## 2014-07-03 MED ORDER — LISINOPRIL 2.5 MG PO TABS
2.5000 mg | ORAL_TABLET | Freq: Every day | ORAL | Status: DC
  Administered 2014-07-03: 2.5 mg via ORAL
  Filled 2014-07-03: qty 1

## 2014-07-03 MED ORDER — HEPARIN (PORCINE) IN NACL 100-0.45 UNIT/ML-% IJ SOLN
1100.0000 [IU]/h | INTRAMUSCULAR | Status: DC
  Administered 2014-07-03: 1100 [IU]/h via INTRAVENOUS
  Filled 2014-07-03: qty 250

## 2014-07-03 MED ORDER — METOPROLOL TARTRATE 12.5 MG HALF TABLET
12.5000 mg | ORAL_TABLET | Freq: Two times a day (BID) | ORAL | Status: DC
  Administered 2014-07-03 – 2014-07-05 (×4): 12.5 mg via ORAL
  Filled 2014-07-03 (×4): qty 1

## 2014-07-03 MED ORDER — ONDANSETRON HCL 4 MG/2ML IJ SOLN
4.0000 mg | Freq: Four times a day (QID) | INTRAMUSCULAR | Status: DC | PRN
  Administered 2014-07-03: 4 mg via INTRAVENOUS
  Filled 2014-07-03: qty 2

## 2014-07-03 MED ORDER — BUDESONIDE 0.25 MG/2ML IN SUSP
0.2500 mg | Freq: Two times a day (BID) | RESPIRATORY_TRACT | Status: DC
  Administered 2014-07-03 – 2014-07-08 (×12): 0.25 mg via RESPIRATORY_TRACT
  Filled 2014-07-03 (×12): qty 2

## 2014-07-03 MED ORDER — FUROSEMIDE 10 MG/ML IJ SOLN
20.0000 mg | Freq: Once | INTRAMUSCULAR | Status: AC
  Administered 2014-07-03: 20 mg via INTRAVENOUS
  Filled 2014-07-03: qty 2

## 2014-07-03 MED ORDER — BUDESONIDE 0.25 MG/2ML IN SUSP: 0.2500 mg | Freq: Two times a day (BID) | RESPIRATORY_TRACT | Status: DC

## 2014-07-03 MED ORDER — ASPIRIN EC 325 MG PO TBEC
325.0000 mg | DELAYED_RELEASE_TABLET | Freq: Every day | ORAL | Status: DC
  Administered 2014-07-04 – 2014-07-08 (×5): 325 mg via ORAL
  Filled 2014-07-03 (×5): qty 1

## 2014-07-03 MED ORDER — DILTIAZEM LOAD VIA INFUSION
20.0000 mg | Freq: Once | INTRAVENOUS | Status: AC
  Administered 2014-07-03: 20 mg via INTRAVENOUS
  Filled 2014-07-03: qty 20

## 2014-07-03 MED ORDER — LEVALBUTEROL HCL 0.63 MG/3ML IN NEBU
0.6300 mg | INHALATION_SOLUTION | Freq: Four times a day (QID) | RESPIRATORY_TRACT | Status: DC
  Administered 2014-07-03: 0.63 mg via RESPIRATORY_TRACT
  Filled 2014-07-03: qty 3

## 2014-07-03 MED ORDER — METOPROLOL TARTRATE 1 MG/ML IV SOLN
5.0000 mg | Freq: Four times a day (QID) | INTRAVENOUS | Status: DC
  Administered 2014-07-03 (×2): 5 mg via INTRAVENOUS
  Filled 2014-07-03 (×2): qty 5

## 2014-07-03 MED ORDER — SODIUM CHLORIDE 0.9 % IJ SOLN
3.0000 mL | Freq: Two times a day (BID) | INTRAMUSCULAR | Status: DC
  Administered 2014-07-03 – 2014-07-07 (×10): 3 mL via INTRAVENOUS

## 2014-07-03 MED ORDER — LEVALBUTEROL HCL 0.63 MG/3ML IN NEBU: 0.6300 mg | INHALATION_SOLUTION | Freq: Four times a day (QID) | RESPIRATORY_TRACT | Status: DC | PRN

## 2014-07-03 MED ORDER — HEPARIN BOLUS VIA INFUSION
4000.0000 [IU] | Freq: Once | INTRAVENOUS | Status: AC
  Administered 2014-07-03: 4000 [IU] via INTRAVENOUS
  Filled 2014-07-03: qty 4000

## 2014-07-03 MED ORDER — ONDANSETRON HCL 4 MG PO TABS: 4.0000 mg | ORAL_TABLET | Freq: Four times a day (QID) | ORAL | Status: DC | PRN

## 2014-07-03 MED ORDER — ACETAMINOPHEN 325 MG PO TABS: 650.0000 mg | ORAL_TABLET | Freq: Four times a day (QID) | ORAL | Status: DC | PRN

## 2014-07-03 MED ORDER — MORPHINE SULFATE 2 MG/ML IJ SOLN: 1.0000 mg | INTRAMUSCULAR | Status: DC | PRN

## 2014-07-03 MED ORDER — HEPARIN (PORCINE) IN NACL 100-0.45 UNIT/ML-% IJ SOLN
1250.0000 [IU]/h | INTRAMUSCULAR | Status: DC
  Administered 2014-07-03 – 2014-07-04 (×3): 1250 [IU]/h via INTRAVENOUS
  Filled 2014-07-03 (×4): qty 250

## 2014-07-03 MED ORDER — TECHNETIUM TC 99M MEBROFENIN IV KIT
5.1000 | PACK | Freq: Once | INTRAVENOUS | Status: AC | PRN
  Administered 2014-07-03: 5.1 via INTRAVENOUS

## 2014-07-03 MED ORDER — ACETAMINOPHEN 650 MG RE SUPP: 650.0000 mg | Freq: Four times a day (QID) | RECTAL | Status: DC | PRN

## 2014-07-03 MED ORDER — PIPERACILLIN-TAZOBACTAM 3.375 G IVPB
3.3750 g | Freq: Three times a day (TID) | INTRAVENOUS | Status: DC
  Administered 2014-07-03 (×2): 3.375 g via INTRAVENOUS
  Filled 2014-07-03 (×2): qty 50

## 2014-07-03 MED ORDER — HEPARIN BOLUS VIA INFUSION
1000.0000 [IU] | Freq: Once | INTRAVENOUS | Status: AC
  Administered 2014-07-03: 1000 [IU] via INTRAVENOUS
  Filled 2014-07-03: qty 1000

## 2014-07-03 MED ORDER — DEXTROSE 5 % IV SOLN
5.0000 mg/h | INTRAVENOUS | Status: DC
  Administered 2014-07-03: 10 mg/h via INTRAVENOUS
  Administered 2014-07-03: 5 mg/h via INTRAVENOUS
  Filled 2014-07-03 (×2): qty 100

## 2014-07-03 MED ORDER — HYDROCOD POLST-CPM POLST ER 10-8 MG/5ML PO SUER
5.0000 mL | Freq: Once | ORAL | Status: AC
  Administered 2014-07-03: 5 mL via ORAL
  Filled 2014-07-03: qty 5

## 2014-07-03 MED ORDER — LEVALBUTEROL HCL 0.63 MG/3ML IN NEBU
0.6300 mg | INHALATION_SOLUTION | Freq: Three times a day (TID) | RESPIRATORY_TRACT | Status: DC
  Administered 2014-07-03 – 2014-07-08 (×16): 0.63 mg via RESPIRATORY_TRACT
  Filled 2014-07-03 (×17): qty 3

## 2014-07-03 MED ORDER — LEVOFLOXACIN IN D5W 750 MG/150ML IV SOLN: 750.0000 mg | INTRAVENOUS | Status: DC

## 2014-07-03 NOTE — H&P (Addendum)
[facility] History and Physical  [name] ZOX:[id number] DOB: [birth year] DOA: 07/02/2014  Referring physician: Dr.Oni. PCP: No PCP Per Patient Dr.harwani. Specialists: Dr.Harwani.  Chief Complaint: Shortness of breath and chest pain.  HPI: [name] is a 49 y.o. male with previous history of atrial fibrillation status post cardioversion presently patient is not taking any medications. And previous history of alcoholism and tobacco abuse which patient states he has quit over the last 5 months presence of the ER because of shortness of breath and chest pain since yesterday. Patient's chest pain is retrosternal pressure-like nonradiating associated with shortness of breath. Patient states he also has been having wheezing with nonproductive cough over the last 1 week. In addition patient is found to have right upper quadrant pain with tenderness which patient states has worsened over the last 4 days. Patient has been worked up for this last year and at that time HIDA scan was unremarkable. On exam patient has significant tenderness. Patient states he did vomit once. On exam patient has bilateral expiratory wheeze and still has chest pain. Patient's heart rate improved with Cardizem 20 mg IV bolus. Patient also became mildly hypotensive for which ER physician had given 1 L fluid bolus. Chest x-ray does show features concerning for pulmonary edema. Patient will be admitted for further management. Dr.Harwani was notified by ER physician and at this time Dr.Harwani has requested hospitalization admission and he will be seeing patient in consult.   Review of Systems: As presented in the history of presenting illness, rest negative.  Past Medical History  Diagnosis Date  . Hypertension   . Alcoholism /alcohol abuse   . Anxiety and depression   . Tobacco abuse   . Atrial fibrillation    Past Surgical History  Procedure Laterality Date  . Hernia repair      UHR x2   Social History:   reports that he quit smoking about 2 months ago. His smoking use included Cigarettes. He has a 30 pack-year smoking history. He has never used smokeless tobacco. He reports that he drinks alcohol. He reports that he does not use illicit drugs. Where does patient live home. Can patient participate in ADLs? Yes.  Allergies  Allergen Reactions  . Bee Venom Anaphylaxis    Uses epi pen-- last used 08/23/11    Family History:  Family History  Problem Relation Age of Onset  . Heart attack Mother   . Heart attack Father   . Hypertension Mother   . Hypertension Father       Prior to Admission medications   Medication Sig Start Date End Date Taking? Authorizing Provider  aspirin 325 MG tablet Take 325-650 mg by mouth daily as needed for mild pain.   Yes Historical Provider, MD    Physical Exam: Filed Vitals:   07/02/14 2230 07/02/14 2236 07/02/14 2245 07/02/14 2300  BP: 105/87 107/85 117/95 100/85  Pulse: 60 117 40 88  Temp:      TempSrc:      Resp: Height:      Weight:      SpO2: 99% 93% 91% 95%     General:  Moderately built and nourished.  Eyes: Anicteric no pallor.  ENT: No discharge from the ears eyes nose and mouth.  Neck: No mass felt. JVD not appreciated.  Cardiovascular: S1-S2 heard.  Respiratory: Bilateral expiratory wheeze heard no crepitations.  Abdomen: Right upper quadrant tenderness. No guarding or rigidity. Bowel sounds present.  Skin: No rash.  Musculoskeletal: No edema.  Psychiatric: Appears normal.  Neurologic: Alert awake oriented to time place and person. Moves all extremities.  Labs on Admission:  Basic Metabolic Panel:  Recent Labs Lab 07/02/14 2146 07/02/14 2205  NA  --  142  K  --  4.2  CL  --  108  GLUCOSE  --  91  BUN  --  27*  CREATININE  --  1.10  MG 2.0  --    Liver Function Tests: No results for input(s): AST, ALT, ALKPHOS, BILITOT, PROT, ALBUMIN in the last 168 hours. No results for input(s): LIPASE,  AMYLASE in the last 168 hours. No results for input(s): AMMONIA in the last 168 hours. CBC:  Recent Labs Lab 07/02/14 2146 07/02/14 2205  WBC 7.6  --   NEUTROABS 3.6  --   HGB 14.9 17.0  HCT 45.1 50.0  MCV 97.0  --   PLT 176  --    Cardiac Enzymes: No results for input(s): CKTOTAL, CKMB, CKMBINDEX, TROPONINI in the last 168 hours.  BNP (last 3 results)  Recent Labs  07/02/14 2348  BNP 873.2*    ProBNP (last 3 results) No results for input(s): PROBNP in the last 8760 hours.  CBG: No results for input(s): GLUCAP in the last 168 hours.  Radiological Exams on Admission: Dg Chest 2 View  07/02/2014   CLINICAL DATA:  Shortness of breath, weakness, nonproductive cough for 4 days, increasing.  EXAM: CHEST  2 VIEW  COMPARISON:  06/23/2013  FINDINGS: Cardiomegaly is unchanged. Peribronchial and interstitial opacities are unchanged, may reflect pulmonary edema, however appears chronic. The previous right lower lobe consolidation has diminished, however minimal residual opacities persist. No new consolidation. There may be small pleural effusions. No acute osseous abnormalities.  IMPRESSION: 1. Right basilar atelectasis or scarring. 2. Stable cardiomegaly. Interstitial opacities concerning for pulmonary edema, however this may be chronic and is unchanged compared to prior.   Electronically Signed   By: Rubye Oaks M.D.   On: 07/02/2014 22:32    EKG: Independently reviewed. A. fib with RVR.  Assessment/Plan Principal Problem:   Atrial fibrillation with RVR Active Problems:   Acute respiratory failure with hypoxia   Chest pain   Right upper quadrant pain   COPD exacerbation   1. Acute respiratory failure with hypoxia secondary to possibly CHF and possible bronchitis - at this time I think patient's CHF is most likely from A. fib with RVR and patient's blood pressure is in the low normals for which I can't give any Lasix at this time. Since patient also has significant abdominal  pain and I have ordered sonogram and then I will be keeping patient nothing by mouth. I have ordered scheduled dose of metoprolol 5 mg IV every 6 hourly for now. Check 2-D echo. Check thyroid function tests. And with regard to patient's bronchitis and wheezing I have placed patient on Pulmicort and nebulizer and if still wheezing may need IV steroids. Patient has been placed on empiric antibiotics. Check drug screen. 2. Chest pain - may be secondary to the elevated heart rate. At this time patient is on heparin infusion. Aspirin. Metoprolol. Cycle cardiac markers. Check 2-D echo. 3. Right upper quadrant pain with tenderness - concerning for cholecystitis. I have ordered a sonogram of the abdomen. Check LFTs and lipase. A she was nothing by mouth. 4. History of alcohol and tobacco abuse - patient states he quit one and 5 months ago.  I have discussed with Dr. Sharyn Lull about patient's  admission.  Addendum - Patient's sonogram shows possible acute cholecystitis. I have discussed with on-call surgeon Dr. Corliss Skains who will be seeing patient in consult.    DVT Prophylaxis heparin infusion.  Code Status: Full code.  Family Communication: Discussed with patient.  Disposition Plan: Admit to inpatient.    Wynona Duhamel N. [facility] Pager [phone].  If 7PM-7AM, please contact night-coverage [email or web address] Password [facility] 07/03/2014, 12:59 AM

## 2014-07-03 NOTE — Progress Notes (Signed)
Date:  July 03, 2014 U.R. performed for needs and level of care. Will continue to follow for Case Management needs.  Rhonda Davis, RN, BSN, CCM   [phone] 

## 2014-07-03 NOTE — Progress Notes (Addendum)
ANTICOAGULATION CONSULT NOTE - Follow-Up  Pharmacy Consult for IV Heparin Indication: A-fib  Allergies  Allergen Reactions  . Bee Venom Anaphylaxis    Uses epi pen-- last used 08/23/11    Patient Measurements: Height: 5\' 6"  (167.6 cm) Weight: 177 lb 0.5 oz (80.3 kg) IBW/kg (Calculated) : 63.8 Heparin Dosing Weight: 79.9 kg  Vital Signs: Temp: 97.5 F (36.4 C) (06/02 0823) Temp Source: Oral (06/02 0823) BP: 116/83 mmHg (06/02 0900) Pulse Rate: 110 (06/02 0900)  Labs:  Recent Labs  07/02/14 2146 07/02/14 2205 07/03/14 0033 07/03/14 0243 07/03/14 0910  HGB 14.9 17.0  --   --   --   HCT 45.1 50.0  --   --   --   PLT 176  --   --   --   --   APTT  --   --   --  35  --   LABPROT 17.4*  --   --   --   --   INR 1.41  --   --   --   --   HEPARINUNFRC  --   --   --   --  0.24*  CREATININE  --  1.10  --   --   --   TROPONINI  --   --  0.04* 0.04* 0.03    Estimated Creatinine Clearance: 80.9 mL/min (by C-G formula based on Cr of 1.1).   Medical History: Past Medical History  Diagnosis Date  . Hypertension   . Alcoholism /alcohol abuse   . Anxiety and depression   . Tobacco abuse   . Atrial fibrillation     Medications:  Prescriptions prior to admission  Medication Sig Dispense Refill Last Dose  . aspirin 325 MG tablet Take 325-650 mg by mouth daily as needed for mild pain.   07/01/2014 at Unknown time   Scheduled:  . aspirin EC  325 mg Oral Daily  . budesonide (PULMICORT) nebulizer solution  0.25 mg Nebulization BID  . diltiazem  20 mg Intravenous Once  . levalbuterol  0.63 mg Nebulization TID  . metoprolol  5 mg Intravenous 4 times per day  . piperacillin-tazobactam (ZOSYN)  IV  3.375 g Intravenous Q8H  . sodium chloride  3 mL Intravenous Q12H   Infusions:  . diltiazem (CARDIZEM) infusion    . heparin 1,100 Units/hr (07/03/14 0249)    Assessment: 64 yoM with PMH of a-fib s/p cardioversion but non-compliant with medications x 1 year. Patient presents  with SOB, chest pain, and RUQ abdominal pain. [place] of abdomen concerning for acute cholecystitis, for which surgery is consulted. Pharmacy consulted to assist with dosing of IV heparin for a-fib.  Today, 07/03/2014:   HL at 0910 = 0.24, subtherapeutic on 1100 units/hr  Baseline CBC WNL  No bleeding or line issues reported per nursing  Goal of Therapy:  Heparin level 0.3-0.7 units/ml Monitor platelets by anticoagulation protocol: Yes   Plan:   Heparin bolus 1000 units IV x 1   Increase heparin infusion to 1250 units/hr  Check heparin level in 6 hours  Daily CBC and heparin level while on heparin infusion  F/u surgery plans    Greer Pickerel, PharmD, BCPS Pager: [phone] 07/03/2014 10:07 AM

## 2014-07-03 NOTE — Progress Notes (Signed)
PHARMACIST - PHYSICIAN COMMUNICATION CONCERNING:  IV heparin  49 yoM on IV heparin for atrial fibrillation with RVR.  Please see pharmacy note written earlier today by Greer Pickerel, PharmD for more details.  In brief, heparin level tonight is therapeutic @ 0.45 (goal 0.3 - 0.7).  No issues per RN.     RECOMMENDATION: Continue IV heparin at 1250 units/hr (=12.22ml/hr).  F/u confirmation level in 6 hours, then [facility].   F/u surgery plans.    Haynes Hoehn, PharmD, BCPS 07/03/2014, 5:29 PM  Pager: [phone]

## 2014-07-03 NOTE — Progress Notes (Signed)
Patient ID: [name], male   DOB: 23-Jun-1965, 49 y.o.   MRN: [id number] Request received for perc cholecystostomy on pt. Imaging studies were reviewed by Dr. Deanne Coffer. He recommends HIDA scan prior to considering cholecystostomy.

## 2014-07-03 NOTE — Consult Note (Signed)
Reason for Consult: Atrial fibrillation with rapid ventricular response Referring Physician: [facility]  [name] is an 49 y.o. male.  HPI: Patient is 49 year old male with past medical history significant for hypertension, history of recurrent A. fib flutter, EtOH abuse, tobacco abuse, depression, anxiety disorder, noncompliant to medication never seen in office in follow-up, came to the ER complaining of retrosternal localized chest pain associated with palpitation coughing generalized weakness for last for 5 days and was noted to be in A. fib with RVR. Patient states he stopped all his medication approximately year ago. Patient denies any anginal chest pain. Denies PND orthopnea or leg swelling. Also complains of vague right-sided abdominal pain associated with nausea and vomiting . Denies any fever or chills. Cardiologic consultation is called for management of atrial fibrillation and cardiac clearance for laparoscopic cholecystectomy. Patient had 2-D echo done approximately one year ago which showed normal LV systolic function repeat 2-D echo today is pending.I have reviewed the patient's current medications.  Past Medical History  Diagnosis Date  . Hypertension   . Alcoholism /alcohol abuse   . Anxiety and depression   . Tobacco abuse   . Atrial fibrillation     Past Surgical History  Procedure Laterality Date  . Hernia repair      UHR x2    Family History  Problem Relation Age of Onset  . Heart attack Mother   . Heart attack Father   . Hypertension Mother   . Hypertension Father     Social History:  reports that he quit smoking about 2 months ago. His smoking use included Cigarettes. He has a 30 pack-year smoking history. He has never used smokeless tobacco. He reports that he drinks alcohol. He reports that he does not use illicit drugs.  Allergies:  Allergies  Allergen Reactions  . Bee Venom Anaphylaxis    Uses epi pen-- last used 08/23/11    Medications: I  have reviewed the patient's current medications.  Results for orders placed or performed during the hospital encounter of 07/02/14 (from the past 48 hour(s))  CBC with Differential/Platelet     Status: None   Collection Time: 07/02/14  9:46 PM  Result Value Ref Range   WBC 7.6 4.0 - 10.5 K/uL   RBC 4.65 4.22 - 5.81 MIL/uL   Hemoglobin 14.9 13.0 - 17.0 g/dL   HCT 29.5 62.1 - 30.8 %   MCV 97.0 78.0 - 100.0 fL   MCH 32.0 26.0 - 34.0 pg   MCHC 33.0 30.0 - 36.0 g/dL   RDW 65.7 84.6 - 96.2 %   Platelets 176 150 - 400 K/uL   Neutrophils Relative % 48 43 - 77 %   Neutro Abs 3.6 1.7 - 7.7 K/uL   Lymphocytes Relative 38 12 - 46 %   Lymphs Abs 2.9 0.7 - 4.0 K/uL   Monocytes Relative 11 3 - 12 %   Monocytes Absolute 0.8 0.1 - 1.0 K/uL   Eosinophils Relative 2 0 - 5 %   Eosinophils Absolute 0.1 0.0 - 0.7 K/uL   Basophils Relative 1 0 - 1 %   Basophils Absolute 0.1 0.0 - 0.1 K/uL  Protime-INR     Status: Abnormal   Collection Time: 07/02/14  9:46 PM  Result Value Ref Range   Prothrombin Time 17.4 (H) 11.6 - 15.2 seconds   INR 1.41 0.00 - 1.49  Ethanol     Status: None   Collection Time: 07/02/14  9:46 PM  Result Value Ref Range  Alcohol, Ethyl (B) <5 <5 mg/dL    Comment:        LOWEST DETECTABLE LIMIT FOR SERUM ALCOHOL IS 11 mg/dL FOR MEDICAL PURPOSES ONLY   Magnesium     Status: None   Collection Time: 07/02/14  9:46 PM  Result Value Ref Range   Magnesium 2.0 1.7 - 2.4 mg/dL  I-stat troponin, ED     Status: None   Collection Time: 07/02/14 10:05 PM  Result Value Ref Range   Troponin i, poc 0.04 0.00 - 0.08 ng/mL   Comment 3            Comment: Due to the release kinetics of cTnI, a negative result within the first hours of the onset of symptoms does not rule out myocardial infarction with certainty. If myocardial infarction is still suspected, repeat the test at appropriate intervals.   I-stat chem 8, ed     Status: Abnormal   Collection Time: 07/02/14 10:05 PM  Result  Value Ref Range   Sodium 142 135 - 145 mmol/L   Potassium 4.2 3.5 - 5.1 mmol/L   Chloride 108 101 - 111 mmol/L   BUN 27 (H) 6 - 20 mg/dL   Creatinine, Ser 0.93 0.61 - 1.24 mg/dL   Glucose, Bld 91 65 - 99 mg/dL   Calcium, Ion 1.12 1.62 - 1.23 mmol/L   TCO2 18 0 - 100 mmol/L   Hemoglobin 17.0 13.0 - 17.0 g/dL   HCT 44.6 95.0 - 72.2 %  Urinalysis, Routine w reflex microscopic (not at [facility])     Status: Abnormal   Collection Time: 07/02/14 10:55 PM  Result Value Ref Range   Color, Urine YELLOW YELLOW   APPearance CLEAR CLEAR   Specific Gravity, Urine 1.024 1.005 - 1.030   pH 5.0 5.0 - 8.0   Glucose, UA NEGATIVE NEGATIVE mg/dL   Hgb urine dipstick NEGATIVE NEGATIVE   Bilirubin Urine NEGATIVE NEGATIVE   Ketones, ur NEGATIVE NEGATIVE mg/dL   Protein, ur 30 (A) NEGATIVE mg/dL   Urobilinogen, UA 0.2 0.0 - 1.0 mg/dL   Nitrite NEGATIVE NEGATIVE   Leukocytes, UA NEGATIVE NEGATIVE  Urine rapid drug screen (hosp performed)not at [facility]     Status: None   Collection Time: 07/02/14 10:55 PM  Result Value Ref Range   Opiates NONE DETECTED NONE DETECTED   Cocaine NONE DETECTED NONE DETECTED   Benzodiazepines NONE DETECTED NONE DETECTED   Amphetamines NONE DETECTED NONE DETECTED   Tetrahydrocannabinol NONE DETECTED NONE DETECTED   Barbiturates NONE DETECTED NONE DETECTED    Comment:        DRUG SCREEN FOR MEDICAL PURPOSES ONLY.  IF CONFIRMATION IS NEEDED FOR ANY PURPOSE, NOTIFY LAB WITHIN 5 DAYS.        LOWEST DETECTABLE LIMITS FOR URINE DRUG SCREEN Drug Class       Cutoff (ng/mL) Amphetamine      1000 Barbiturate      200 Benzodiazepine   200 Tricyclics       300 Opiates          300 Cocaine          300 THC              50   Urine microscopic-add on     Status: None   Collection Time: 07/02/14 10:55 PM  Result Value Ref Range   WBC, UA 0-2 <3 WBC/hpf   Urine-Other MUCOUS PRESENT   Brain natriuretic peptide     Status: Abnormal   Collection  Time: 07/02/14 11:48 PM  Result Value  Ref Range   B Natriuretic Peptide 873.2 (H) 0.0 - 100.0 pg/mL  Hepatic function panel     Status: Abnormal   Collection Time: 07/03/14 12:13 AM  Result Value Ref Range   Total Protein 6.2 (L) 6.5 - 8.1 g/dL   Albumin 3.4 (L) 3.5 - 5.0 g/dL   AST 161 (H) 15 - 41 U/L   ALT 111 (H) 17 - 63 U/L   Alkaline Phosphatase 83 38 - 126 U/L   Total Bilirubin 0.7 0.3 - 1.2 mg/dL   Bilirubin, Direct 0.2 0.1 - 0.5 mg/dL   Indirect Bilirubin 0.5 0.3 - 0.9 mg/dL  Lipase, blood     Status: Abnormal   Collection Time: 07/03/14 12:13 AM  Result Value Ref Range   Lipase 16 (L) 22 - 51 U/L  TSH     Status: None   Collection Time: 07/03/14 12:14 AM  Result Value Ref Range   TSH 3.175 0.350 - 4.500 uIU/mL  Troponin I     Status: Abnormal   Collection Time: 07/03/14 12:33 AM  Result Value Ref Range   Troponin I 0.04 (H) <0.031 ng/mL    Comment:        PERSISTENTLY INCREASED TROPONIN VALUES IN THE RANGE OF 0.04-0.49 ng/mL CAN BE SEEN IN:       -UNSTABLE ANGINA       -CONGESTIVE HEART FAILURE       -MYOCARDITIS       -CHEST TRAUMA       -ARRYHTHMIAS       -LATE PRESENTING MYOCARDIAL INFARCTION       -COPD   CLINICAL FOLLOW-UP RECOMMENDED.   Troponin I (q 6hr x 3)     Status: Abnormal   Collection Time: 07/03/14  2:43 AM  Result Value Ref Range   Troponin I 0.04 (H) <0.031 ng/mL    Comment:        PERSISTENTLY INCREASED TROPONIN VALUES IN THE RANGE OF 0.04-0.49 ng/mL CAN BE SEEN IN:       -UNSTABLE ANGINA       -CONGESTIVE HEART FAILURE       -MYOCARDITIS       -CHEST TRAUMA       -ARRYHTHMIAS       -LATE PRESENTING MYOCARDIAL INFARCTION       -COPD   CLINICAL FOLLOW-UP RECOMMENDED.   T4, free     Status: None   Collection Time: 07/03/14  2:43 AM  Result Value Ref Range   Free T4 0.95 0.61 - 1.12 ng/dL    Comment: Performed at [facility]  APTT     Status: None   Collection Time: 07/03/14  2:43 AM  Result Value Ref Range   aPTT 35 24 - 37 seconds  MRSA PCR Screening      Status: None   Collection Time: 07/03/14  3:53 AM  Result Value Ref Range   MRSA by PCR NEGATIVE NEGATIVE    Comment:        The GeneXpert MRSA Assay (FDA approved for NASAL specimens only), is one component of a comprehensive MRSA colonization surveillance program. It is not intended to diagnose MRSA infection nor to guide or monitor treatment for MRSA infections.   Troponin I (q 6hr x 3)     Status: None   Collection Time: 07/03/14  9:10 AM  Result Value Ref Range   Troponin I 0.03 <0.031 ng/mL    Comment:  NO INDICATION OF MYOCARDIAL INJURY.   Heparin level (unfractionated)     Status: Abnormal   Collection Time: 07/03/14  9:10 AM  Result Value Ref Range   Heparin Unfractionated 0.24 (L) 0.30 - 0.70 IU/mL    Comment:        IF HEPARIN RESULTS ARE BELOW EXPECTED VALUES, AND PATIENT DOSAGE HAS BEEN CONFIRMED, SUGGEST FOLLOW UP TESTING OF ANTITHROMBIN III LEVELS.     Dg Chest 2 View  07/02/2014   CLINICAL DATA:  Shortness of breath, weakness, nonproductive cough for 4 days, increasing.  EXAM: CHEST  2 VIEW  COMPARISON:  06/23/2013  FINDINGS: Cardiomegaly is unchanged. Peribronchial and interstitial opacities are unchanged, may reflect pulmonary edema, however appears chronic. The previous right lower lobe consolidation has diminished, however minimal residual opacities persist. No new consolidation. There may be small pleural effusions. No acute osseous abnormalities.  IMPRESSION: 1. Right basilar atelectasis or scarring. 2. Stable cardiomegaly. Interstitial opacities concerning for pulmonary edema, however this may be chronic and is unchanged compared to prior.   Electronically Signed   By: Rubye Oaks M.D.   On: 07/02/2014 22:32   US Abdomen Limited Ruq  07/03/2014   CLINICAL DATA:  Acute onset of right upper quadrant abdominal pain. Initial encounter.  EXAM: US ABDOMEN LIMITED - RIGHT UPPER QUADRANT  COMPARISON:  Right upper quadrant ultrasound performed 06/21/2013   FINDINGS: Gallbladder:  Diffuse gallbladder wall thickening is seen, with the gallbladder wall measuring up to 1.2 cm in thickness. No definite stones are characterized. However, a positive ultrasonographic Murphy's sign is elicited. Findings are concerning for acute cholecystitis.  Common bile duct:  Diameter: 0.3 cm, within normal limits in caliber.  Liver:  No focal lesion identified. Within normal limits in parenchymal echogenicity.  A small right pleural effusion is noted.  IMPRESSION: 1. Diffuse gallbladder wall thickening noted, with a positive ultrasonographic Murphy's sign. Gallbladder wall thickening is worsened from the prior study. No definite stones seen. Findings are concerning for acute cholecystitis; this could reflect acalculous cholecystitis. 2. Small right pleural effusion noted.   Electronically Signed   By: Roanna Raider M.D.   On: 07/03/2014 01:28    Review of Systems  Constitutional: Negative for fever and chills.  Eyes: Negative for double vision.  Respiratory: Positive for cough and shortness of breath.   Cardiovascular: Positive for chest pain and palpitations. Negative for orthopnea, claudication, leg swelling and PND.  Gastrointestinal: Positive for nausea and vomiting.  Genitourinary: Negative for dysuria.  Neurological: Negative for dizziness and headaches.   Blood pressure 116/83, pulse 110, temperature 97.5 F (36.4 C), temperature source Oral, resp. rate 25, height  (1.676 m), weight 80.3 kg (177 lb 0.5 oz), SpO2 95 %. Physical Exam  Constitutional: He is oriented to person, place, and time.  HENT:  Head: Normocephalic and atraumatic.  Eyes: Conjunctivae are normal. Pupils are equal, round, and reactive to light. No scleral icterus.  Neck: Normal range of motion. Neck supple. No JVD present. No tracheal deviation present. No thyromegaly present.  Cardiovascular:  Irregularly irregular tachycardic S1 and S2 soft  Respiratory:  Decreased breath sound at  bases  GI:  Soft bowel sounds present right upper quadrant tenderness present no guarding  Musculoskeletal: He exhibits no edema or tenderness.  Neurological: He is alert and oriented to person, place, and time.    Assessment/Plan: Recurrent A. fib flutter with rapid ventricular response rule out tachycardia induced cardiomyopathy Mild volume overload Atypical chest pain Possible acute cholecystitis Hypertension  EtOH abuse Tobacco abuse Depression Anxiety disorder Plan Agree with present management Start Cardizem IV as per orders Discussed with patient at length regarding rhythm control with TEE/cardioversion versus rate control and anticoagulation, patient refused for TEE or any invasive procedure but consents for medical management only. Check 2-D echo if LVEF in normal range patient will be acceptable risk for laparoscopic cholecystectomy. Hopefully his rate will be controlled with IV Cardizem and Lopressor. Heparin could be  stopped perioperatively and can be restarted after surgery. Rinaldo Cloud 07/03/2014, 10:08 AM

## 2014-07-03 NOTE — Care Management Note (Signed)
Case Management Note  Patient Details  Name: [name] MRN: [id number] Date of Birth: [birth year]  Subjective/Objective:                  Atrial fib  Action/Plan:home   Expected Discharge Date:  07/07/14               Expected Discharge Plan:  Home/Self Care  In-House Referral:  NA  Discharge planning Services  CM Consult, Indigent Health Clinic, Medication Assistance  Post Acute Care Choice:  NA Choice offered to:  NA  DME Arranged:    DME Agency:     HH Arranged:    HH Agency:     Status of Service:  In process, will continue to follow  Medicare Important Message Given:    Date Medicare IM Given:    Medicare IM give by:    Date Additional Medicare IM Given:    Additional Medicare Important Message give by:     If discussed at Long Length of Stay Meetings, dates discussed:    Additional Comments:  Golda Acre, RN 07/03/2014, 9:38 AM

## 2014-07-03 NOTE — Progress Notes (Signed)
PHARMACIST - PHYSICIAN COMMUNICATION CONCERNING:  IV heparin  49 yoM on IV heparin for atrial fibrillation with RVR.  Please see pharmacy note written earlier today by Greer Pickerel, PharmD for more details.  In brief, Assessement:   2214 HL=0.41 (therapeutic x2)  No problems reported per RN     Plan:  Continue IV heparin at 1250 units/hr (=12.61ml/hr).    DHLs.    F/u surgery plans.    Lorenza Evangelist 07/03/2014, 11:14 PM

## 2014-07-03 NOTE — Progress Notes (Signed)
ANTICOAGULATION CONSULT NOTE - Initial Consult  Pharmacy Consult for IV Heparin Indication: A-fib  Allergies  Allergen Reactions  . Bee Venom Anaphylaxis    Uses epi pen-- last used 08/23/11    Patient Measurements: Height: 5\' 6"  (167.6 cm) Weight: 160 lb (72.576 kg) IBW/kg (Calculated) : 63.8 Heparin Dosing Weight: 72 kg  Vital Signs: Temp: 98 F (36.7 C) (06/02 0135) Temp Source: Oral (06/02 0135) BP: 113/95 mmHg (06/02 0145) Pulse Rate: 100 (06/02 0145)  Labs:  Recent Labs  07/02/14 2146 07/02/14 2205 07/03/14 0033  HGB 14.9 17.0  --   HCT 45.1 50.0  --   PLT 176  --   --   LABPROT 17.4*  --   --   INR 1.41  --   --   CREATININE  --  1.10  --   TROPONINI  --   --  0.04*    Estimated Creatinine Clearance: 73.3 mL/min (by C-G formula based on Cr of 1.1).   Medical History: Past Medical History  Diagnosis Date  . Hypertension   . Alcoholism /alcohol abuse   . Anxiety and depression   . Tobacco abuse   . Atrial fibrillation     Medications:  Prescriptions prior to admission  Medication Sig Dispense Refill Last Dose  . aspirin 325 MG tablet Take 325-650 mg by mouth daily as needed for mild pain.   07/01/2014 at Unknown time   Scheduled:  . aspirin EC  325 mg Oral Daily  . budesonide (PULMICORT) nebulizer solution  0.25 mg Nebulization BID  . heparin  4,000 Units Intravenous Once  . levalbuterol  0.63 mg Nebulization TID  . levofloxacin (LEVAQUIN) IV  750 mg Intravenous Q24H  . metoprolol  5 mg Intravenous 4 times per day  . sodium chloride  3 mL Intravenous Q12H   Infusions:  . heparin      Assessment: [name] c/o SOB hx of A-fib but non- compliant with medications x 1 year.  IV Heparin per Rx for A-fib.   Goal of Therapy:  Heparin level 0.3-0.7 units/ml Monitor platelets by anticoagulation protocol: Yes   Plan:   Baseline aPtt stat  Heparin 4000 unit bolus x1  Start drip @ 1100 units/hr  Daily CBC/HL  Check 1st HL in 6  hours   Susanne Greenhouse R 07/03/2014,2:35 AM

## 2014-07-03 NOTE — Progress Notes (Addendum)
ANTIBIOTIC CONSULT NOTE - INITIAL  Pharmacy Consult for Levaquin changed to Zosyn Indication: Intra-abdominal infection  Allergies  Allergen Reactions  . Bee Venom Anaphylaxis    Uses epi pen-- last used 08/23/11    Patient Measurements: Height: 5\' 6"  (167.6 cm) Weight: 160 lb (72.576 kg) IBW/kg (Calculated) : 63.8   Vital Signs: Temp: 98 F (36.7 C) (06/02 0135) Temp Source: Oral (06/02 0135) BP: 113/95 mmHg (06/02 0145) Pulse Rate: 100 (06/02 0145) Intake/Output from previous day: 06/01 0701 - 06/02 0700 In: 1050 [I.V.:1050] Out: -  Intake/Output from this shift: Total I/O In: 1050 [I.V.:1050] Out: -   Labs:  Recent Labs  07/02/14 2146 07/02/14 2205  WBC 7.6  --   HGB 14.9 17.0  PLT 176  --   CREATININE  --  1.10   Estimated Creatinine Clearance: 73.3 mL/min (by C-G formula based on Cr of 1.1). No results for input(s): VANCOTROUGH, VANCOPEAK, VANCORANDOM, GENTTROUGH, GENTPEAK, GENTRANDOM, TOBRATROUGH, TOBRAPEAK, TOBRARND, AMIKACINPEAK, AMIKACINTROU, AMIKACIN in the last 72 hours.   Microbiology: No results found for this or any previous visit (from the past 720 hour(s)).  Medical History: Past Medical History  Diagnosis Date  . Hypertension   . Alcoholism /alcohol abuse   . Anxiety and depression   . Tobacco abuse   . Atrial fibrillation     Assessment: 49 yoM c/o SOB.  Zosyn per Rx for intra-abdominal infection  Goal of Therapy:  Treat infection  Plan:   Zosyn 3.375 gm EI  F/u Scr/cultures as needed  Susanne Greenhouse R 07/03/2014,2:30 AM

## 2014-07-03 NOTE — Consult Note (Signed)
[facility] Surgery Consult Note  [name] [birth year]  [id number].    Requesting MD: Dr. Hal Hope Chief Complaint/Reason for Consult: RUQ abdominal pain  HPI:  49 y/o white male with PMH AFIB s/p cardioversion with h/o of HTN, homelessness, alcoholism, tobacco abuse and medicine non-compliance presents to of the [facility] because of SOB and chest pain for the last 4-5 days.  Patient's chest pain is retrosternal pressure-like nonradiating associated with shortness of breath.  Patient states he also has been having wheezing with nonproductive cough over the last 1 week.  In addition patient is found to have right upper quadrant pain with tenderness and nausea and one episode of vomiting.  He eats a lot of fatty/fried foods and recently ate a very greasy burger.  RUQ abdominal pain worse in the last 4-5 days, no radiating pain, pain is 5-8/10.  Patient has been worked up for this last year for his gallbladder and at that time HIDA scan was unremarkable.  H/o umbilical hernia repair x2 with mesh.  Reportedly been dry from alcohol for 5-6 months and no smoking for 2-3 months.  Ultrasound shows diffuse gallbladder wall thickening with a positive murphy's sign, gallbladder wall thickening worse than prior study, no stones.  Small right pleural effusion.  WBC, alk phos, bili, and lipase normal.  ALT/AST in the 100's.  In AFIB with RVR currently.  Cardiology pending consult.     ROS: All systems reviewed and otherwise negative except for as above  Family History  Problem Relation Age of Onset  . Heart attack Mother   . Heart attack Father   . Hypertension Mother   . Hypertension Father     Past Medical History  Diagnosis Date  . Hypertension   . Alcoholism /alcohol abuse   . Anxiety and depression   . Tobacco abuse   . Atrial fibrillation     Past Surgical History  Procedure Laterality Date  . Hernia repair      UHR x2    Social History:  reports that he quit smoking about 2 months  ago. His smoking use included Cigarettes. He has a 30 pack-year smoking history. He has never used smokeless tobacco. He reports that he drinks alcohol. He reports that he does not use illicit drugs.  Allergies:  Allergies  Allergen Reactions  . Bee Venom Anaphylaxis    Uses epi pen-- last used 08/23/11    Medications Prior to Admission  Medication Sig Dispense Refill  . aspirin 325 MG tablet Take 325-650 mg by mouth daily as needed for mild pain.      Blood pressure 120/88, pulse 57, temperature 98.1 F (36.7 C), temperature source Oral, resp. rate 22, height 5' 6" (1.676 m), weight 80.3 kg (177 lb 0.5 oz), SpO2 91 %. Physical Exam: General: pleasant, WD/WN white male who is laying in bed in NAD HEENT: head is normocephalic, atraumatic.  Sclera are noninjected, no icterus.  PERRL.  Ears and nose without any masses or lesions.  Mouth is pink and moist Heart: Ir/Ir tachycardic.  No obvious murmurs, gallops, or rubs noted.  Palpable pedal pulses bilaterally Lungs: CTAB, no wheezes, rhonchi, or rales noted.  Respiratory effort nonlabored, good effort Abd: soft, ND, exquisitely tender in RUQ, +murphys sign, +BS, no masses, hernias, or organomegaly, scar around umbilicus from hernia surgery. MS: all 4 extremities are symmetrical with no cyanosis, clubbing, or edema. Skin: warm and dry with no masses, lesions, or rashes, no icterus Psych: A&Ox3 with an appropriate affect.  Results for orders placed or performed during the hospital encounter of 07/02/14 (from the past 48 hour(s))  CBC with Differential/Platelet     Status: None   Collection Time: 07/02/14  9:46 PM  Result Value Ref Range   WBC 7.6 4.0 - 10.5 K/uL   RBC 4.65 4.22 - 5.81 MIL/uL   Hemoglobin 14.9 13.0 - 17.0 g/dL   HCT 45.1 39.0 - 52.0 %   MCV 97.0 78.0 - 100.0 fL   MCH 32.0 26.0 - 34.0 pg   MCHC 33.0 30.0 - 36.0 g/dL   RDW 13.7 11.5 - 15.5 %   Platelets 176 150 - 400 K/uL   Neutrophils Relative % 48 43 - 77 %   Neutro  Abs 3.6 1.7 - 7.7 K/uL   Lymphocytes Relative 38 12 - 46 %   Lymphs Abs 2.9 0.7 - 4.0 K/uL   Monocytes Relative 11 3 - 12 %   Monocytes Absolute 0.8 0.1 - 1.0 K/uL   Eosinophils Relative 2 0 - 5 %   Eosinophils Absolute 0.1 0.0 - 0.7 K/uL   Basophils Relative 1 0 - 1 %   Basophils Absolute 0.1 0.0 - 0.1 K/uL  Protime-INR     Status: Abnormal   Collection Time: 07/02/14  9:46 PM  Result Value Ref Range   Prothrombin Time 17.4 (H) 11.6 - 15.2 seconds   INR 1.41 0.00 - 1.49  Ethanol     Status: None   Collection Time: 07/02/14  9:46 PM  Result Value Ref Range   Alcohol, Ethyl (B) <5 <5 mg/dL    Comment:        LOWEST DETECTABLE LIMIT FOR SERUM ALCOHOL IS 11 mg/dL FOR MEDICAL PURPOSES ONLY   Magnesium     Status: None   Collection Time: 07/02/14  9:46 PM  Result Value Ref Range   Magnesium 2.0 1.7 - 2.4 mg/dL  I-stat troponin, ED     Status: None   Collection Time: 07/02/14 10:05 PM  Result Value Ref Range   Troponin i, poc 0.04 0.00 - 0.08 ng/mL   Comment 3            Comment: Due to the release kinetics of cTnI, a negative result within the first hours of the onset of symptoms does not rule out myocardial infarction with certainty. If myocardial infarction is still suspected, repeat the test at appropriate intervals.   I-stat chem 8, ed     Status: Abnormal   Collection Time: 07/02/14 10:05 PM  Result Value Ref Range   Sodium 142 135 - 145 mmol/L   Potassium 4.2 3.5 - 5.1 mmol/L   Chloride 108 101 - 111 mmol/L   BUN 27 (H) 6 - 20 mg/dL   Creatinine, Ser 1.10 0.61 - 1.24 mg/dL   Glucose, Bld 91 65 - 99 mg/dL   Calcium, Ion 1.22 1.12 - 1.23 mmol/L   TCO2 18 0 - 100 mmol/L   Hemoglobin 17.0 13.0 - 17.0 g/dL   HCT 50.0 39.0 - 52.0 %  Urinalysis, Routine w reflex microscopic (not at [facility])     Status: Abnormal   Collection Time: 07/02/14 10:55 PM  Result Value Ref Range   Color, Urine YELLOW YELLOW   APPearance CLEAR CLEAR   Specific Gravity, Urine 1.024 1.005 - 1.030    pH 5.0 5.0 - 8.0   Glucose, UA NEGATIVE NEGATIVE mg/dL   Hgb urine dipstick NEGATIVE NEGATIVE   Bilirubin Urine NEGATIVE NEGATIVE   Ketones, ur NEGATIVE  NEGATIVE mg/dL   Protein, ur 30 (A) NEGATIVE mg/dL   Urobilinogen, UA 0.2 0.0 - 1.0 mg/dL   Nitrite NEGATIVE NEGATIVE   Leukocytes, UA NEGATIVE NEGATIVE  Urine rapid drug screen (hosp performed)not at [facility]     Status: None   Collection Time: 07/02/14 10:55 PM  Result Value Ref Range   Opiates NONE DETECTED NONE DETECTED   Cocaine NONE DETECTED NONE DETECTED   Benzodiazepines NONE DETECTED NONE DETECTED   Amphetamines NONE DETECTED NONE DETECTED   Tetrahydrocannabinol NONE DETECTED NONE DETECTED   Barbiturates NONE DETECTED NONE DETECTED    Comment:        DRUG SCREEN FOR MEDICAL PURPOSES ONLY.  IF CONFIRMATION IS NEEDED FOR ANY PURPOSE, NOTIFY LAB WITHIN 5 DAYS.        LOWEST DETECTABLE LIMITS FOR URINE DRUG SCREEN Drug Class       Cutoff (ng/mL) Amphetamine      1000 Barbiturate      200 Benzodiazepine   329 Tricyclics       924 Opiates          300 Cocaine          300 THC              50   Urine microscopic-add on     Status: None   Collection Time: 07/02/14 10:55 PM  Result Value Ref Range   WBC, UA 0-2 <3 WBC/hpf   Urine-Other MUCOUS PRESENT   Brain natriuretic peptide     Status: Abnormal   Collection Time: 07/02/14 11:48 PM  Result Value Ref Range   B Natriuretic Peptide 873.2 (H) 0.0 - 100.0 pg/mL  Hepatic function panel     Status: Abnormal   Collection Time: 07/03/14 12:13 AM  Result Value Ref Range   Total Protein 6.2 (L) 6.5 - 8.1 g/dL   Albumin 3.4 (L) 3.5 - 5.0 g/dL   AST 109 (H) 15 - 41 U/L   ALT 111 (H) 17 - 63 U/L   Alkaline Phosphatase 83 38 - 126 U/L   Total Bilirubin 0.7 0.3 - 1.2 mg/dL   Bilirubin, Direct 0.2 0.1 - 0.5 mg/dL   Indirect Bilirubin 0.5 0.3 - 0.9 mg/dL  Lipase, blood     Status: Abnormal   Collection Time: 07/03/14 12:13 AM  Result Value Ref Range   Lipase 16 (L) 22 - 51  U/L  TSH     Status: None   Collection Time: 07/03/14 12:14 AM  Result Value Ref Range   TSH 3.175 0.350 - 4.500 uIU/mL  Troponin I     Status: Abnormal   Collection Time: 07/03/14 12:33 AM  Result Value Ref Range   Troponin I 0.04 (H) <0.031 ng/mL    Comment:        PERSISTENTLY INCREASED TROPONIN VALUES IN THE RANGE OF 0.04-0.49 ng/mL CAN BE SEEN IN:       -UNSTABLE ANGINA       -CONGESTIVE HEART FAILURE       -MYOCARDITIS       -CHEST TRAUMA       -ARRYHTHMIAS       -LATE PRESENTING MYOCARDIAL INFARCTION       -COPD   CLINICAL FOLLOW-UP RECOMMENDED.   Troponin I (q 6hr x 3)     Status: Abnormal   Collection Time: 07/03/14  2:43 AM  Result Value Ref Range   Troponin I 0.04 (H) <0.031 ng/mL    Comment:  PERSISTENTLY INCREASED TROPONIN VALUES IN THE RANGE OF 0.04-0.49 ng/mL CAN BE SEEN IN:       -UNSTABLE ANGINA       -CONGESTIVE HEART FAILURE       -MYOCARDITIS       -CHEST TRAUMA       -ARRYHTHMIAS       -LATE PRESENTING MYOCARDIAL INFARCTION       -COPD   CLINICAL FOLLOW-UP RECOMMENDED.   APTT     Status: None   Collection Time: 07/03/14  2:43 AM  Result Value Ref Range   aPTT 35 24 - 37 seconds  MRSA PCR Screening     Status: None   Collection Time: 07/03/14  3:53 AM  Result Value Ref Range   MRSA by PCR NEGATIVE NEGATIVE    Comment:        The GeneXpert MRSA Assay (FDA approved for NASAL specimens only), is one component of a comprehensive MRSA colonization surveillance program. It is not intended to diagnose MRSA infection nor to guide or monitor treatment for MRSA infections.    Dg Chest 2 View  07/02/2014   CLINICAL DATA:  Shortness of breath, weakness, nonproductive cough for 4 days, increasing.  EXAM: CHEST  2 VIEW  COMPARISON:  06/23/2013  FINDINGS: Cardiomegaly is unchanged. Peribronchial and interstitial opacities are unchanged, may reflect pulmonary edema, however appears chronic. The previous right lower lobe consolidation has  diminished, however minimal residual opacities persist. No new consolidation. There may be small pleural effusions. No acute osseous abnormalities.  IMPRESSION: 1. Right basilar atelectasis or scarring. 2. Stable cardiomegaly. Interstitial opacities concerning for pulmonary edema, however this may be chronic and is unchanged compared to prior.   Electronically Signed   By: Jeb Levering M.D.   On: 07/02/2014 22:32   US Abdomen Limited Ruq  07/03/2014   CLINICAL DATA:  Acute onset of right upper quadrant abdominal pain. Initial encounter.  EXAM: US ABDOMEN LIMITED - RIGHT UPPER QUADRANT  COMPARISON:  Right upper quadrant ultrasound performed 06/21/2013  FINDINGS: Gallbladder:  Diffuse gallbladder wall thickening is seen, with the gallbladder wall measuring up to 1.2 cm in thickness. No definite stones are characterized. However, a positive ultrasonographic Murphy's sign is elicited. Findings are concerning for acute cholecystitis.  Common bile duct:  Diameter: 0.3 cm, within normal limits in caliber.  Liver:  No focal lesion identified. Within normal limits in parenchymal echogenicity.  A small right pleural effusion is noted.  IMPRESSION: 1. Diffuse gallbladder wall thickening noted, with a positive ultrasonographic Murphy's sign. Gallbladder wall thickening is worsened from the prior study. No definite stones seen. Findings are concerning for acute cholecystitis; this could reflect acalculous cholecystitis. 2. Small right pleural effusion noted.   Electronically Signed   By: Garald Balding M.D.   On: 07/03/2014 01:28      Assessment/Plan RUQ abdominal pain Transaminitis 1.  WBC, Alk phos, and bilirubin levels are normal, his AST/ALT are elevated 109/111.  US shows Diffuse gallbladder wall thickening noted, with a positive ultrasonographic Murphy's sign. Gallbladder wall thickening is worsened from the prior study. No definite stones seen. Findings are concerning for acute cholecystitis; this could  reflect acalculous cholecystitis.  He has had a HIDA in the past which was negative 1 year ago (05/2013).   2.  NPO, bowel rest, IVF, pain control, antiemetics, antibiotics (started on zosyn Day #1) 3.  Needs cardiology to tune him up, but needs to be treated urgently by perc chole tube because we believe he  is becoming septic from the cholecysitits.  He is exquisitely tender.  When able to come off heparin per cardiology/medicine can proceed with IR perc drain.  Ordered perc chole tube.  Would not recommend lap chole at this time.  AFIB with RVR -Cardiology following H/o alcohol and tobacco abuse     Nat Christen, [facility] Surgery 07/03/2014, 7:33 AM Pager: [phone]

## 2014-07-03 NOTE — Progress Notes (Signed)
[facility] PROGRESS NOTE  [name] ZOX:[id number] DOB: 08-Sep-1965 DOA: 07/02/2014 PCP: No PCP Per Patient  Brief Summary  The patient is a 49 yo M with history of a-fib s/p cardioversion, HTN, homelessness, alcoholism in remission, tobacco abuse who presented with chest pain and SOB for the last 4-5 days.  He also endorsed wheezing and cough.  He was found to have some RUQ pain with positive Murphy's sign and RUQ [place] was concerning for acute cholecystitis, however, HIDA scan demonstrated contrast in the gallbladder and biliary tree emptying freely.    Assessment/Plan  Acute systolic congestive heart failure likely due to previous EtOH use  -  Echocardiogram demonstrates a moderately dilated left ventricle with severely reduced systolic function and no regional wall motion abnormalities. He has severe mitral valve regurgitation, severely dilated left atrium, dilated right ventricle with reduced systolic function, and a moderately to severely dilated right atrium. His peak PA pressure was 40 mmHg -  Will start low dose ACEI -  Will convert IV metoprolol to PO -  Has small effusion on right so will try dose of lasix  Severe mitral valve regurgitation -  Judicious use of IVF -  Defer further management to cardiology and possible CT surgery  Atrial fibrillation, his tachycardia was likely secondary to acute heart failure and possible acute cholecystitis -  Continue heparin drip -  Rate control with metoprolol, change to PO -  Would be cautious with diltiazem gtt in the setting of severely reduced EF and would consider alternatives if possible -  Appreciate cardiology assistance  Chest pain/tightness, may be related to tachycardia but may also be due to COPD.  He had flat but mildly elevated troponins and no regional wall motion abnormalities on ECHO to suggest acute ischemia. -  Continue aspirin, beta blocker -  Add statin once able to tolerate by mouth and liver function tests back  to normal -  Continue xopenex and budesonide which appear to be helping -  Avoid ipratropium due to risk of worsening tachycardia -  Consider trial of prednisone if he develops increased sputum or changes to sputum -  Lung function tests after discharge  Possible acute cholecystitis, not a good candidate for surgery given new onset acute systolic heart failure with ongoing chest pains. -  Appreciate general surgery and interventional radiology assistance -  Patient currently undergoing HIDA scan:  Neg -  D/c zosyn  Transaminitis may be secondary to acute hepatitis or congestion from heart failure or surreptitious alcohol use -  Check acute hepatitis panel  -  Trial of lasix  Diet:  Nothing by mouth Access:  PIV IVF:  Yes Proph:  Heparin  Code Status: Full Family Communication: patient and his friend [place] Disposition Plan: after transitioning of dilt gtt   Consultants:  Cardiology  General surgery  Interventional radiology  Procedures:  Chest x-ray  Right upper quadrant ultrasound  Echocardiogram  Antibiotics:  Zosyn 6/2   HPI/Subjective:  Chest pain and breathing are better with nebulizer treatments.  Has had diarrhea and vomiting but currently feeling well  Objective: Filed Vitals:   07/03/14 0908 07/03/14 1000 07/03/14 1051 07/03/14 1200  BP:  114/95 101/71   Pulse:  40 41   Temp:    98.1 F (36.7 C)  TempSrc:    Oral  Resp:  34 35   Height:      Weight:      SpO2: 95% 100% 92%     Intake/Output Summary (Last 24 hours)  at 07/03/14 1409 Last data filed at 07/03/14 1100  Gross per 24 hour  Intake 1806.23 ml  Output    500 ml  Net 1306.23 ml   Filed Weights   07/02/14 2104 07/03/14 0500  Weight: 72.576 kg (160 lb) 80.3 kg (177 lb 0.5 oz)    Exam:   General:  Adult male, No acute distress, limited insight into medical conditions and care  HEENT:  NCAT, MMM  Cardiovascular:  IRRR, nl S1, S2 no mrg, 2+ pulses, warm  extremities  Respiratory:  Diminished bilateral breath sounds with some rales the right base, no rhonchi, no increased WOB  Abdomen:   NABS, soft, NT/ND.  Negative Murphy's sign on my exam  MSK:   Normal tone and bulk, no LEE  Neuro:  Grossly moves all extremities  Data Reviewed: Basic Metabolic Panel:  Recent Labs Lab 07/02/14 2146 07/02/14 2205  NA  --  142  K  --  4.2  CL  --  108  GLUCOSE  --  91  BUN  --  27*  CREATININE  --  1.10  MG 2.0  --    Liver Function Tests:  Recent Labs Lab 07/03/14 0013  AST 109*  ALT 111*  ALKPHOS 83  BILITOT 0.7  PROT 6.2*  ALBUMIN 3.4*    Recent Labs Lab 07/03/14 0013  LIPASE 16*   No results for input(s): AMMONIA in the last 168 hours. CBC:  Recent Labs Lab 07/02/14 2146 07/02/14 2205  WBC 7.6  --   NEUTROABS 3.6  --   HGB 14.9 17.0  HCT 45.1 50.0  MCV 97.0  --   PLT 176  --    Cardiac Enzymes:  Recent Labs Lab 07/03/14 0033 07/03/14 0243 07/03/14 0910  TROPONINI 0.04* 0.04* 0.03   BNP (last 3 results)  Recent Labs  07/02/14 2348  BNP 873.2*    ProBNP (last 3 results) No results for input(s): PROBNP in the last 8760 hours.  CBG: No results for input(s): GLUCAP in the last 168 hours.  Recent Results (from the past 240 hour(s))  MRSA PCR Screening     Status: None   Collection Time: 07/03/14  3:53 AM  Result Value Ref Range Status   MRSA by PCR NEGATIVE NEGATIVE Final    Comment:        The GeneXpert MRSA Assay (FDA approved for NASAL specimens only), is one component of a comprehensive MRSA colonization surveillance program. It is not intended to diagnose MRSA infection nor to guide or monitor treatment for MRSA infections.      Studies: Dg Chest 2 View  07/02/2014   CLINICAL DATA:  Shortness of breath, weakness, nonproductive cough for 4 days, increasing.  EXAM: CHEST  2 VIEW  COMPARISON:  06/23/2013  FINDINGS: Cardiomegaly is unchanged. Peribronchial and interstitial opacities are  unchanged, may reflect pulmonary edema, however appears chronic. The previous right lower lobe consolidation has diminished, however minimal residual opacities persist. No new consolidation. There may be small pleural effusions. No acute osseous abnormalities.  IMPRESSION: 1. Right basilar atelectasis or scarring. 2. Stable cardiomegaly. Interstitial opacities concerning for pulmonary edema, however this may be chronic and is unchanged compared to prior.   Electronically Signed   By: Rubye Oaks M.D.   On: 07/02/2014 22:32   US Abdomen Limited Ruq  07/03/2014   CLINICAL DATA:  Acute onset of right upper quadrant abdominal pain. Initial encounter.  EXAM: US ABDOMEN LIMITED - RIGHT UPPER QUADRANT  COMPARISON:  Right upper quadrant ultrasound performed 06/21/2013  FINDINGS: Gallbladder:  Diffuse gallbladder wall thickening is seen, with the gallbladder wall measuring up to 1.2 cm in thickness. No definite stones are characterized. However, a positive ultrasonographic Murphy's sign is elicited. Findings are concerning for acute cholecystitis.  Common bile duct:  Diameter: 0.3 cm, within normal limits in caliber.  Liver:  No focal lesion identified. Within normal limits in parenchymal echogenicity.  A small right pleural effusion is noted.  IMPRESSION: 1. Diffuse gallbladder wall thickening noted, with a positive ultrasonographic Murphy's sign. Gallbladder wall thickening is worsened from the prior study. No definite stones seen. Findings are concerning for acute cholecystitis; this could reflect acalculous cholecystitis. 2. Small right pleural effusion noted.   Electronically Signed   By: Roanna Raider M.D.   On: 07/03/2014 01:28    Scheduled Meds: . aspirin EC  325 mg Oral Daily  . budesonide (PULMICORT) nebulizer solution  0.25 mg Nebulization BID  . levalbuterol  0.63 mg Nebulization TID  . metoprolol  5 mg Intravenous 4 times per day  . piperacillin-tazobactam (ZOSYN)  IV  3.375 g Intravenous Q8H  .  sodium chloride  3 mL Intravenous Q12H   Continuous Infusions: . diltiazem (CARDIZEM) infusion 10 mg/hr (07/03/14 1014)  . heparin 1,250 Units/hr (07/03/14 1019)    Principal Problem:   Atrial fibrillation with RVR Active Problems:   Acute respiratory failure with hypoxia   Chest pain   Right upper quadrant pain   COPD exacerbation    Time spent: 30 min    Bartow Zylstra, [facility]  [facility] Pager [phone]. If 7PM-7AM, please contact night-coverage at [email or web address], password [facility] 07/03/2014, 2:09 PM  LOS: 1 day

## 2014-07-03 NOTE — Progress Notes (Signed)
Echocardiogram 2D Echocardiogram has been performed.  Nolon Rod 07/03/2014, 12:35 PM

## 2014-07-04 DIAGNOSIS — R072 Precordial pain: Secondary | ICD-10-CM

## 2014-07-04 LAB — COMPREHENSIVE METABOLIC PANEL
ALBUMIN: 3.2 g/dL — AB (ref 3.5–5.0)
ALK PHOS: 73 U/L (ref 38–126)
ALT: 275 U/L — AB (ref 17–63)
AST: 324 U/L — ABNORMAL HIGH (ref 15–41)
Anion gap: 8 (ref 5–15)
BILIRUBIN TOTAL: 0.9 mg/dL (ref 0.3–1.2)
BUN: 25 mg/dL — AB (ref 6–20)
CHLORIDE: 107 mmol/L (ref 101–111)
CO2: 23 mmol/L (ref 22–32)
CREATININE: 1.39 mg/dL — AB (ref 0.61–1.24)
Calcium: 8.6 mg/dL — ABNORMAL LOW (ref 8.9–10.3)
GFR calc Af Amer: >60 mL/min (ref >60)
GFR calc non Af Amer: 58 mL/min — ABNORMAL LOW (ref >60)
Glucose, Bld: 91 mg/dL (ref 65–99)
POTASSIUM: 4.1 mmol/L (ref 3.5–5.1)
Sodium: 138 mmol/L (ref 135–145)
TOTAL PROTEIN: 5.8 g/dL — AB (ref 6.5–8.1)

## 2014-07-04 LAB — CBC
HCT: 42.1 % (ref 39.0–52.0)
HEMOGLOBIN: 13.8 g/dL (ref 13.0–17.0)
MCH: 32.2 pg (ref 26.0–34.0)
MCHC: 32.8 g/dL (ref 30.0–36.0)
MCV: 98.1 fL (ref 78.0–100.0)
Platelets: 180 10*3/uL (ref 150–400)
RBC: 4.29 MIL/uL (ref 4.22–5.81)
RDW: 13.8 % (ref 11.5–15.5)
WBC: 6.2 10*3/uL (ref 4.0–10.5)

## 2014-07-04 LAB — T3, FREE: T3 FREE: 2.8 pg/mL (ref 2.0–4.4)

## 2014-07-04 LAB — HEPARIN LEVEL (UNFRACTIONATED): HEPARIN UNFRACTIONATED: 0.37 [IU]/mL (ref 0.30–0.70)

## 2014-07-04 LAB — FERRITIN: FERRITIN: 336 ng/mL (ref 24–336)

## 2014-07-04 LAB — ACETAMINOPHEN LEVEL: Acetaminophen (Tylenol), Serum: <10 ug/mL — ABNORMAL LOW (ref 10–30)

## 2014-07-04 MED ORDER — SODIUM CHLORIDE 0.9 % IV SOLN
INTRAVENOUS | Status: DC
Start: 2014-07-04 — End: 2014-07-05
  Administered 2014-07-04: 14:00:00 via INTRAVENOUS

## 2014-07-04 MED ORDER — DIGOXIN 125 MCG PO TABS
0.2500 mg | ORAL_TABLET | Freq: Every day | ORAL | Status: DC
  Administered 2014-07-04 – 2014-07-05 (×2): 0.25 mg via ORAL
  Filled 2014-07-04 (×2): qty 2

## 2014-07-04 MED ORDER — LISINOPRIL 2.5 MG PO TABS: 2.5000 mg | ORAL_TABLET | Freq: Every day | ORAL | Status: DC

## 2014-07-04 MED ORDER — CETYLPYRIDINIUM CHLORIDE 0.05 % MT LIQD
7.0000 mL | Freq: Two times a day (BID) | OROMUCOSAL | Status: DC
  Administered 2014-07-04 – 2014-07-08 (×9): 7 mL via OROMUCOSAL

## 2014-07-04 MED ORDER — PREDNISONE 20 MG PO TABS
40.0000 mg | ORAL_TABLET | Freq: Every day | ORAL | Status: DC
  Administered 2014-07-05 – 2014-07-07 (×3): 40 mg via ORAL
  Filled 2014-07-04 (×3): qty 2

## 2014-07-04 NOTE — Progress Notes (Signed)
Patient ID: [name], male   DOB: 02-13-1965, 49 y.o.   MRN: [id number] Case reviewed by Dr. Fredia Sorrow today , including labs, recent imaging. HIDA scan 6/3 showed patent cystic/CBD; pt currently afebrile with normal WBC. If clinical status worsens or findings point more toward acute cholecystitis can place cholecystostomy drain. Please contact Dr. Fredia Sorrow at [phone] with any additional questions.

## 2014-07-04 NOTE — Progress Notes (Signed)
ANTICOAGULATION CONSULT NOTE - Follow Up Consult  Pharmacy Consult for Heparin Indication: atrial fibrillation  Allergies  Allergen Reactions  . Bee Venom Anaphylaxis    Uses epi pen-- last used 08/23/11    Patient Measurements: Height: 5\' 6"  (167.6 cm) Weight: 175 lb 14.8 oz (79.8 kg) IBW/kg (Calculated) : 63.8 Heparin Dosing Weight: actual weight  Vital Signs: Temp: 98.5 F (36.9 C) (06/03 0400) Temp Source: Oral (06/03 0400) BP: 111/69 mmHg (06/03 0500) Pulse Rate: 114 (06/03 0600)  Labs:  Recent Labs  07/02/14 2146 07/02/14 2205  07/03/14 0243  07/03/14 0910 07/03/14 1505 07/03/14 1635 07/03/14 2214 07/04/14 0347  HGB 14.9 17.0  --   --   --   --   --  14.7  --  13.8  HCT 45.1 50.0  --   --   --   --   --  44.2  --  42.1  PLT 176  --   --   --   --   --   --  166  --  180  APTT  --   --   --  35  --   --   --   --   --   --   LABPROT 17.4*  --   --   --   --   --   --   --   --   --   INR 1.41  --   --   --   --   --   --   --   --   --   HEPARINUNFRC  --   --   --   --   < > 0.24*  --  0.45 0.41 0.37  CREATININE  --  1.10  --   --   --   --   --   --   --  1.39*  TROPONINI  --   --   < > 0.04*  --  0.03 0.03  --   --   --   < > = values in this interval not displayed.  Estimated Creatinine Clearance: 63.8 mL/min (by C-G formula based on Cr of 1.39).   Medications:  Infusions:  . diltiazem (CARDIZEM) infusion Stopped (07/03/14 1930)  . heparin 1,250 Units/hr (07/03/14 2153)    Assessment: 73 yoM with admitted with chest pain and SOB, wheezing, cough, and RUQ pain.  PMH includes AFibs/p cardioversion, HTN, homelessness, alcoholism in remission, tobacco abuse.  HIDA scan negative.  Found to have acute systolic CHF, severe mitral valve regurgitation, and AFib likely d/t CHF.  Pharmacy is consulted to dose Heparin for AFib.   HL 0.37, remains therapeutic  CBC: Hgb 13.8, Plt 180  SCr increased to 1.39, CrCl ~ 64 ml/min   Goal of Therapy:  Heparin  level 0.3-0.7 units/ml Monitor platelets by anticoagulation protocol: Yes   Plan:   Continue heparin IV infusion at 1250 units/hr  Daily heparin level and CBC  Continue to monitor H&H and platelets   Lynann Beaver PharmD, BCPS Pager [phone] 07/04/2014 7:18 AM

## 2014-07-04 NOTE — Progress Notes (Signed)
[facility]  [address] [place], [place] [address] Phone: [phone] FAX: [phone]   [name] [id number] 06-06-1965   Problem List:   Principal Problem:   Atrial fibrillation with RVR Active Problems:   Acute respiratory failure with hypoxia   Chest pain   Right upper quadrant pain   COPD exacerbation        Assessment  Improving.  Suspicious for chronic cholecystitis but no definite cystic duct obstruction  Plan:  -Int Rad denied placing drain given HIDA showing no obstruction - reconsider if he worsens.  Better clinically so hold off on drain.  GB abnormal & ?probable biliary colic = may benefit from chole at some point.  Poor OR candidate w A Fib/ RVR.  Would not consider lap chole until cardiac issues stabilized, cleared by cardiology, and can come off anticoagulation safely - doubt this admission.  Challenge to consider lap chole 6-8 weeks later given social situation.  -Abx per primary service     -eval liver for NASH/cirrhosis at some point if LFT worsen.  No major cirrhosis by U/S  -EtOH abstinence  -CT scan abdomen if worsens to r/o other etiologies  -VTE prophylaxis- SCDs, etc  -mobilize as tolerated to help recovery  The patient is stable.  There is no evidence of peritonitis, acute abdomen, nor shock.  There is no strong evidence of failure of improvement nor decline with current non-operative management.  There is no need for surgery at the present moment.   We will be available.  D/w patient & Dr Sheran Fava primary MD IM whom agree   [name], M.D., F.A.C.S. Gastrointestinal and Minimally Invasive Surgery [facility] [address] [place], [place] [address] [phone] Main / Paging   07/04/2014  Subjective:  Feels better Denies RUQ pain No N/V  Objective:  Vital signs:  Filed Vitals:   07/04/14 0400 07/04/14 0500 07/04/14 0600 07/04/14 0700   BP: 114/72 111/69  97/81  Pulse: 46 58 114 101  Temp: 98.5 F (36.9 C)     TempSrc: Oral     Resp: _0 Height:      Weight: 79.8 kg (175 lb 14.8 oz)     SpO2: 93% 94% 94% 95%    Last BM Date: 07/03/14  Intake/Output   Yesterday:  06/02 0701 - 06/03 0700 In: 1060.8 [P.O.:120; I.V.:890.8; IV Piggyback:50] Out: 2650 [Urine:2650] This shift:     Bowel function:  Flatus: Y  BM: N  Drain: N/A  Physical Exam:  General: Pt awake/alert/oriented x4 in no acute distress Eyes: PERRL, normal EOM.  Sclera clear.  No icterus Neuro: CN II-XII intact w/o focal sensory/motor deficits. Lymph: No head/neck/groin lymphadenopathy Psych:  No delerium/psychosis/paranoia HENT: Normocephalic, Mucus membranes moist.  No thrush Neck: Supple, No tracheal deviation Chest: No chest wall pain w good excursion CV:  Pulses intact.  Regular rhythm MS: Normal AROM mjr joints.  No obvious deformity Abdomen: Soft.  Nondistended.  Nontender in abdomen.  No Murphy sign (as opposed to yesterday) No evidence of peritonitis.  No incarcerated hernias. Ext:  SCDs BLE.  No mjr edema.  No cyanosis Skin: No petechiae / purpura  Results:   Labs: Results for orders placed or performed during the hospital encounter of 07/02/14 (from the past 48 hour(s))  CBC with Differential/Platelet     Status: None   Collection Time: 07/02/14  9:46 PM  Result Value Ref Range  WBC 7.6 4.0 - 10.5 K/uL   RBC 4.65 4.22 - 5.81 MIL/uL   Hemoglobin 14.9 13.0 - 17.0 g/dL   HCT 45.1 39.0 - 52.0 %   MCV 97.0 78.0 - 100.0 fL   MCH 32.0 26.0 - 34.0 pg   MCHC 33.0 30.0 - 36.0 g/dL   RDW 13.7 11.5 - 15.5 %   Platelets 176 150 - 400 K/uL   Neutrophils Relative % 48 43 - 77 %   Neutro Abs 3.6 1.7 - 7.7 K/uL   Lymphocytes Relative 38 12 - 46 %   Lymphs Abs 2.9 0.7 - 4.0 K/uL   Monocytes Relative 11 3 - 12 %   Monocytes Absolute 0.8 0.1 - 1.0 K/uL   Eosinophils Relative 2 0 - 5 %   Eosinophils Absolute 0.1 0.0 - 0.7 K/uL    Basophils Relative 1 0 - 1 %   Basophils Absolute 0.1 0.0 - 0.1 K/uL  Protime-INR     Status: Abnormal   Collection Time: 07/02/14  9:46 PM  Result Value Ref Range   Prothrombin Time 17.4 (H) 11.6 - 15.2 seconds   INR 1.41 0.00 - 1.49  Ethanol     Status: None   Collection Time: 07/02/14  9:46 PM  Result Value Ref Range   Alcohol, Ethyl (B) <5 <5 mg/dL    Comment:        LOWEST DETECTABLE LIMIT FOR SERUM ALCOHOL IS 11 mg/dL FOR MEDICAL PURPOSES ONLY   Magnesium     Status: None   Collection Time: 07/02/14  9:46 PM  Result Value Ref Range   Magnesium 2.0 1.7 - 2.4 mg/dL  I-stat troponin, ED     Status: None   Collection Time: 07/02/14 10:05 PM  Result Value Ref Range   Troponin i, poc 0.04 0.00 - 0.08 ng/mL   Comment 3            Comment: Due to the release kinetics of cTnI, a negative result within the first hours of the onset of symptoms does not rule out myocardial infarction with certainty. If myocardial infarction is still suspected, repeat the test at appropriate intervals.   I-stat chem 8, ed     Status: Abnormal   Collection Time: 07/02/14 10:05 PM  Result Value Ref Range   Sodium 142 135 - 145 mmol/L   Potassium 4.2 3.5 - 5.1 mmol/L   Chloride 108 101 - 111 mmol/L   BUN 27 (H) 6 - 20 mg/dL   Creatinine, Ser 1.10 0.61 - 1.24 mg/dL   Glucose, Bld 91 65 - 99 mg/dL   Calcium, Ion 1.22 1.12 - 1.23 mmol/L   TCO2 18 0 - 100 mmol/L   Hemoglobin 17.0 13.0 - 17.0 g/dL   HCT 50.0 39.0 - 52.0 %  Urinalysis, Routine w reflex microscopic (not at [facility])     Status: Abnormal   Collection Time: 07/02/14 10:55 PM  Result Value Ref Range   Color, Urine YELLOW YELLOW   APPearance CLEAR CLEAR   Specific Gravity, Urine 1.024 1.005 - 1.030   pH 5.0 5.0 - 8.0   Glucose, UA NEGATIVE NEGATIVE mg/dL   Hgb urine dipstick NEGATIVE NEGATIVE   Bilirubin Urine NEGATIVE NEGATIVE   Ketones, ur NEGATIVE NEGATIVE mg/dL   Protein, ur 30 (A) NEGATIVE mg/dL   Urobilinogen, UA 0.2 0.0 -  1.0 mg/dL   Nitrite NEGATIVE NEGATIVE   Leukocytes, UA NEGATIVE NEGATIVE  Urine rapid drug screen (hosp performed)not at [facility]  Status: None   Collection Time: 07/02/14 10:55 PM  Result Value Ref Range   Opiates NONE DETECTED NONE DETECTED   Cocaine NONE DETECTED NONE DETECTED   Benzodiazepines NONE DETECTED NONE DETECTED   Amphetamines NONE DETECTED NONE DETECTED   Tetrahydrocannabinol NONE DETECTED NONE DETECTED   Barbiturates NONE DETECTED NONE DETECTED    Comment:        DRUG SCREEN FOR MEDICAL PURPOSES ONLY.  IF CONFIRMATION IS NEEDED FOR ANY PURPOSE, NOTIFY LAB WITHIN 5 DAYS.        LOWEST DETECTABLE LIMITS FOR URINE DRUG SCREEN Drug Class       Cutoff (ng/mL) Amphetamine      1000 Barbiturate      200 Benzodiazepine   297 Tricyclics       989 Opiates          300 Cocaine          300 THC              50   Urine microscopic-add on     Status: None   Collection Time: 07/02/14 10:55 PM  Result Value Ref Range   WBC, UA 0-2 <3 WBC/hpf   Urine-Other MUCOUS PRESENT   Brain natriuretic peptide     Status: Abnormal   Collection Time: 07/02/14 11:48 PM  Result Value Ref Range   B Natriuretic Peptide 873.2 (H) 0.0 - 100.0 pg/mL  Hepatic function panel     Status: Abnormal   Collection Time: 07/03/14 12:13 AM  Result Value Ref Range   Total Protein 6.2 (L) 6.5 - 8.1 g/dL   Albumin 3.4 (L) 3.5 - 5.0 g/dL   AST 109 (H) 15 - 41 U/L   ALT 111 (H) 17 - 63 U/L   Alkaline Phosphatase 83 38 - 126 U/L   Total Bilirubin 0.7 0.3 - 1.2 mg/dL   Bilirubin, Direct 0.2 0.1 - 0.5 mg/dL   Indirect Bilirubin 0.5 0.3 - 0.9 mg/dL  Lipase, blood     Status: Abnormal   Collection Time: 07/03/14 12:13 AM  Result Value Ref Range   Lipase 16 (L) 22 - 51 U/L  TSH     Status: None   Collection Time: 07/03/14 12:14 AM  Result Value Ref Range   TSH 3.175 0.350 - 4.500 uIU/mL  Troponin I     Status: Abnormal   Collection Time: 07/03/14 12:33 AM  Result Value Ref Range   Troponin I 0.04 (H)  <0.031 ng/mL    Comment:        PERSISTENTLY INCREASED TROPONIN VALUES IN THE RANGE OF 0.04-0.49 ng/mL CAN BE SEEN IN:       -UNSTABLE ANGINA       -CONGESTIVE HEART FAILURE       -MYOCARDITIS       -CHEST TRAUMA       -ARRYHTHMIAS       -LATE PRESENTING MYOCARDIAL INFARCTION       -COPD   CLINICAL FOLLOW-UP RECOMMENDED.   Troponin I (q 6hr x 3)     Status: Abnormal   Collection Time: 07/03/14  2:43 AM  Result Value Ref Range   Troponin I 0.04 (H) <0.031 ng/mL    Comment:        PERSISTENTLY INCREASED TROPONIN VALUES IN THE RANGE OF 0.04-0.49 ng/mL CAN BE SEEN IN:       -UNSTABLE ANGINA       -CONGESTIVE HEART FAILURE       -MYOCARDITIS       -  CHEST TRAUMA       -ARRYHTHMIAS       -LATE PRESENTING MYOCARDIAL INFARCTION       -COPD   CLINICAL FOLLOW-UP RECOMMENDED.   T4, free     Status: None   Collection Time: 07/03/14  2:43 AM  Result Value Ref Range   Free T4 0.95 0.61 - 1.12 ng/dL    Comment: Performed at [facility]  T3, free     Status: None   Collection Time: 07/03/14  2:43 AM  Result Value Ref Range   T3, Free 2.8 2.0 - 4.4 pg/mL    Comment: (NOTE) Performed At: [facility] [address] [place], [place] [id number] Lindon Romp MD NT:[id number]   APTT     Status: None   Collection Time: 07/03/14  2:43 AM  Result Value Ref Range   aPTT 35 24 - 37 seconds  MRSA PCR Screening     Status: None   Collection Time: 07/03/14  3:53 AM  Result Value Ref Range   MRSA by PCR NEGATIVE NEGATIVE    Comment:        The GeneXpert MRSA Assay (FDA approved for NASAL specimens only), is one component of a comprehensive MRSA colonization surveillance program. It is not intended to diagnose MRSA infection nor to guide or monitor treatment for MRSA infections.   Troponin I (q 6hr x 3)     Status: None   Collection Time: 07/03/14  9:10 AM  Result Value Ref Range   Troponin I 0.03 <0.031 ng/mL    Comment:        NO INDICATION OF MYOCARDIAL  INJURY.   Heparin level (unfractionated)     Status: Abnormal   Collection Time: 07/03/14  9:10 AM  Result Value Ref Range   Heparin Unfractionated 0.24 (L) 0.30 - 0.70 IU/mL    Comment:        IF HEPARIN RESULTS ARE BELOW EXPECTED VALUES, AND PATIENT DOSAGE HAS BEEN CONFIRMED, SUGGEST FOLLOW UP TESTING OF ANTITHROMBIN III LEVELS.   Troponin I (q 6hr x 3)     Status: None   Collection Time: 07/03/14  3:05 PM  Result Value Ref Range   Troponin I 0.03 <0.031 ng/mL    Comment:        NO INDICATION OF MYOCARDIAL INJURY.   CBC     Status: None   Collection Time: 07/03/14  4:35 PM  Result Value Ref Range   WBC 7.0 4.0 - 10.5 K/uL   RBC 4.47 4.22 - 5.81 MIL/uL   Hemoglobin 14.7 13.0 - 17.0 g/dL   HCT 44.2 39.0 - 52.0 %   MCV 98.9 78.0 - 100.0 fL   MCH 32.9 26.0 - 34.0 pg   MCHC 33.3 30.0 - 36.0 g/dL   RDW 13.9 11.5 - 15.5 %   Platelets 166 150 - 400 K/uL  Heparin level (unfractionated)     Status: None   Collection Time: 07/03/14  4:35 PM  Result Value Ref Range   Heparin Unfractionated 0.45 0.30 - 0.70 IU/mL    Comment:        IF HEPARIN RESULTS ARE BELOW EXPECTED VALUES, AND PATIENT DOSAGE HAS BEEN CONFIRMED, SUGGEST FOLLOW UP TESTING OF ANTITHROMBIN III LEVELS.   Heparin level (unfractionated)     Status: None   Collection Time: 07/03/14 10:14 PM  Result Value Ref Range   Heparin Unfractionated 0.41 0.30 - 0.70 IU/mL    Comment:  IF HEPARIN RESULTS ARE BELOW EXPECTED VALUES, AND PATIENT DOSAGE HAS BEEN CONFIRMED, SUGGEST FOLLOW UP TESTING OF ANTITHROMBIN III LEVELS.   CBC     Status: None   Collection Time: 07/04/14  3:47 AM  Result Value Ref Range   WBC 6.2 4.0 - 10.5 K/uL   RBC 4.29 4.22 - 5.81 MIL/uL   Hemoglobin 13.8 13.0 - 17.0 g/dL   HCT 42.1 39.0 - 52.0 %   MCV 98.1 78.0 - 100.0 fL   MCH 32.2 26.0 - 34.0 pg   MCHC 32.8 30.0 - 36.0 g/dL   RDW 13.8 11.5 - 15.5 %   Platelets 180 150 - 400 K/uL  Heparin level (unfractionated)     Status: None    Collection Time: 07/04/14  3:47 AM  Result Value Ref Range   Heparin Unfractionated 0.37 0.30 - 0.70 IU/mL    Comment:        IF HEPARIN RESULTS ARE BELOW EXPECTED VALUES, AND PATIENT DOSAGE HAS BEEN CONFIRMED, SUGGEST FOLLOW UP TESTING OF ANTITHROMBIN III LEVELS.   Comprehensive metabolic panel     Status: Abnormal   Collection Time: 07/04/14  3:47 AM  Result Value Ref Range   Sodium 138 135 - 145 mmol/L   Potassium 4.1 3.5 - 5.1 mmol/L   Chloride 107 101 - 111 mmol/L   CO2 23 22 - 32 mmol/L   Glucose, Bld 91 65 - 99 mg/dL   BUN 25 (H) 6 - 20 mg/dL   Creatinine, Ser 1.39 (H) 0.61 - 1.24 mg/dL   Calcium 8.6 (L) 8.9 - 10.3 mg/dL   Total Protein 5.8 (L) 6.5 - 8.1 g/dL   Albumin 3.2 (L) 3.5 - 5.0 g/dL   AST 324 (H) 15 - 41 U/L   ALT 275 (H) 17 - 63 U/L   Alkaline Phosphatase 73 38 - 126 U/L   Total Bilirubin 0.9 0.3 - 1.2 mg/dL   GFR calc non Af Amer 58 (L) >60 mL/min   GFR calc Af Amer >60 >60 mL/min    Comment: (NOTE) The eGFR has been calculated using the CKD EPI equation. This calculation has not been validated in all clinical situations. eGFR's persistently <60 mL/min signify possible Chronic Kidney Disease.    Anion gap 8 5 - 15  Ferritin     Status: None   Collection Time: 07/04/14  3:47 AM  Result Value Ref Range   Ferritin 336 24 - 336 ng/mL    Comment: Performed at [facility]    Imaging / Studies: Dg Chest 2 View  07/02/2014   CLINICAL DATA:  Shortness of breath, weakness, nonproductive cough for 4 days, increasing.  EXAM: CHEST  2 VIEW  COMPARISON:  06/23/2013  FINDINGS: Cardiomegaly is unchanged. Peribronchial and interstitial opacities are unchanged, may reflect pulmonary edema, however appears chronic. The previous right lower lobe consolidation has diminished, however minimal residual opacities persist. No new consolidation. There may be small pleural effusions. No acute osseous abnormalities.  IMPRESSION: 1. Right basilar atelectasis or scarring. 2.  Stable cardiomegaly. Interstitial opacities concerning for pulmonary edema, however this may be chronic and is unchanged compared to prior.   Electronically Signed   By: Jeb Levering M.D.   On: 07/02/2014 22:32   Nm Hepatobiliary Liver Func  07/03/2014   CLINICAL DATA:  Gallbladder wall thickening on ultrasound. No gallstones.  EXAM: NUCLEAR MEDICINE HEPATOBILIARY IMAGING  TECHNIQUE: Sequential images of the abdomen were obtained out to 60 minutes following intravenous administration of radiopharmaceutical.  RADIOPHARMACEUTICALS:  5.1 Tc-15mCholetec IV  COMPARISON:  None.  FINDINGS: There is prompt uptake and excretion of the radiopharmaceutical by the liver. There is prompt and increasing activity in the gallbladder. There is activity in central extrahepatic bile ducts and small bowel before 1 hour.  IMPRESSION: 1. Patency of cystic and common bile ducts.   Electronically Signed   By: DLucrezia EuropeM.D.   On: 07/03/2014 16:11   [place]domen Limited Ruq  07/03/2014   CLINICAL DATA:  Acute onset of right upper quadrant abdominal pain. Initial encounter.  EXAM: [place]DOMEN LIMITED - RIGHT UPPER QUADRANT  COMPARISON:  Right upper quadrant ultrasound performed 06/21/2013  FINDINGS: Gallbladder:  Diffuse gallbladder wall thickening is seen, with the gallbladder wall measuring up to 1.2 cm in thickness. No definite stones are characterized. However, a positive ultrasonographic Murphy's sign is elicited. Findings are concerning for acute cholecystitis.  Common bile duct:  Diameter: 0.3 cm, within normal limits in caliber.  Liver:  No focal lesion identified. Within normal limits in parenchymal echogenicity.  A small right pleural effusion is noted.  IMPRESSION: 1. Diffuse gallbladder wall thickening noted, with a positive ultrasonographic Murphy's sign. Gallbladder wall thickening is worsened from the prior study. No definite stones seen. Findings are concerning for acute cholecystitis; this could reflect acalculous  cholecystitis. 2. Small right pleural effusion noted.   Electronically Signed   By: JGarald BaldingM.D.   On: 07/03/2014 01:28    Medications / Allergies: per chart  Antibiotics: Anti-infectives    Start     Dose/Rate Route Frequency Ordered Stop   07/03/14 0400  piperacillin-tazobactam (ZOSYN) IVPB 3.375 g  Status:  Discontinued     3.375 g 12.5 mL/hr over 240 Minutes Intravenous Every 8 hours 07/03/14 0259 07/03/14 1919   07/03/14 0200  levofloxacin (LEVAQUIN) IVPB 750 mg  Status:  Discontinued     750 mg 100 mL/hr over 90 Minutes Intravenous Every 24 hours 07/03/14 0153 07/03/14 0256        Note: Portions of this report may have been transcribed using voice recognition software. Every effort was made to ensure accuracy; however, inadvertent computerized transcription errors may be present.   Any transcriptional errors that result from this process are unintentional.     SAdin [name] M.D., F.A.C.S. Gastrointestinal and Minimally Invasive Surgery [facility] [address] [place] [phone] / Paging   07/04/2014  CARE TEAM:  PCP: No PCP Per Patient  Outpatient Care Team: Patient Care Team: No Pcp Per Patient as PCP - General (General Practice)  Inpatient Treatment Team: Treatment Team: Attending Provider: MJanece Canterbury MD; Rounding Team: WJoycelyn Das MD; Consulting Physician: MNolon Nations MD; Consulting Physician: MCharolette Forward MD

## 2014-07-04 NOTE — Progress Notes (Signed)
[facility] PROGRESS NOTE  Daylyn Navedo YKD:[id number] DOB: 11/07/1965 DOA: 07/02/2014 PCP: No PCP Per Patient  Brief Summary  The patient is a 49 yo M with history of a-fib s/p cardioversion, HTN, homelessness, alcoholism in remission, tobacco abuse who presented with chest pain and SOB for the last 4-5 days.  He also endorsed wheezing and cough.  He was found to have some RUQ pain with positive Murphy's sign and RUQ [place] was concerning for acute cholecystitis, however, HIDA scan demonstrated contrast in the gallbladder and biliary tree emptying freely.    Assessment/Plan  Acute systolic congestive heart failure likely due to previous EtOH use  -  Echocardiogram demonstrates a moderately dilated left ventricle with severely reduced systolic function and no regional wall motion abnormalities. He has severe mitral valve regurgitation, severely dilated left atrium, dilated right ventricle with reduced systolic function, and a moderately to severely dilated right atrium. His peak PA pressure was 40 mmHg -  Discontinue ACEI secondary to AKI > may resume tomorrow -  Continue metoprolol at current dose due to hypotension -  D/c lasix  -  Digoxin added 6/3  Severe mitral valve regurgitation -  Judicious use of IVF -  Defer further management to cardiology and possible CT surgery  Atrial fibrillation, patient declining cardioversion -  CHADs2vasc = 2  (CHF, HTN) -  HASBLED = 4 (HTN, Abnl renal/liver, EtOH) -  Continue heparin drip and consider transitioning to NOAC, but defer A/C to cardiology -  Continue metoprolol and dig -  Transition off dilt gtt as soon as possible given degree of heart failure -  Appreciate cardiology assistance  Chest pain/tightness, may be related to tachycardia but may also be due to COPD exacerbation.  He had flat but mildly elevated troponins and no regional wall motion abnormalities on ECHO to suggest acute ischemia. -  Continue aspirin, beta blocker -  No  statin secondary to transaminitis   Acute COPD exacerbation -  Outpatient PFTs -  Continue xopenex and budesonide  -  Start Kristalynn Coddington course of prednisone  Possible acute cholecystitis, not a good candidate for surgery given new onset acute systolic heart failure with ongoing chest pains. -  Appreciate general surgery and interventional radiology assistance -  Patient currently undergoing HIDA scan:  Neg -  D/c'd zosyn on 6/2  Transaminitis may be secondary to acute hepatitis or congestion from heart failure or surreptitious alcohol use.   Rising, however.   -  Check acute hepatitis panel  -  Tylenol level negative  Mild AKI likely due to dehydration and ACEI -  Hold ACEI -  Hold diuretics  Diet:  Nothing by mouth Access:  PIV IVF:  Yes Proph:  Heparin  Code Status: Full Family Communication: patient and his friend [place] Disposition Plan: after transitioning of dilt gtt   Consultants:  Cardiology  General surgery  Interventional radiology  Procedures:  Chest x-ray  Right upper quadrant ultrasound  Echocardiogram  Antibiotics:  Zosyn 6/2   HPI/Subjective:  Chest pain and breathing are better with nebulizer treatments.  Denies abdominal pain and nausea. Objective: Filed Vitals:   07/04/14 1240 07/04/14 1300 07/04/14 1345 07/04/14 1403  BP:  108/84    Pulse:  58 98   Temp: 98.2 F (36.8 C)     TempSrc: Oral     Resp:  25    Height:      Weight:      SpO2:  94%  97%    Intake/Output Summary (Last  24 hours) at 07/04/14 1551 Last data filed at 07/04/14 1400  Gross per 24 hour  Intake 579.83 ml  Output   2650 ml  Net -2070.17 ml   Filed Weights   07/02/14 2104 07/03/14 0500 07/04/14 0400  Weight: 72.576 kg (160 lb) 80.3 kg (177 lb 0.5 oz) 79.8 kg (175 lb 14.8 oz)    Exam:   General:  Adult male, No acute distress, limited insight into medical conditions and care  HEENT:  NCAT, MMM  Cardiovascular:  IRRR, nl S1, S2 no mrg, 2+ pulses, warm  extremities  Respiratory:  Diminished bilateral breath sounds with some rales and very diminished BS at the right base, no rhonchi, no increased WOB  Abdomen:   NABS, soft, NT/ND.  Negative Murphy's sign on my exam  MSK:   Normal tone and bulk, no LEE  Neuro:  Grossly moves all extremities  Data Reviewed: Basic Metabolic Panel:  Recent Labs Lab 07/02/14 2146 07/02/14 2205 07/04/14 0347  NA  --  142 138  K  --  4.2 4.1  CL  --  108 107  CO2  --   --  23  GLUCOSE  --  91 91  BUN  --  27* 25*  CREATININE  --  1.10 1.39*  CALCIUM  --   --  8.6*  MG 2.0  --   --    Liver Function Tests:  Recent Labs Lab 07/03/14 0013 07/04/14 0347  AST 109* 324*  ALT 111* 275*  ALKPHOS 83 73  BILITOT 0.7 0.9  PROT 6.2* 5.8*  ALBUMIN 3.4* 3.2*    Recent Labs Lab 07/03/14 0013  LIPASE 16*   No results for input(s): AMMONIA in the last 168 hours. CBC:  Recent Labs Lab 07/02/14 2146 07/02/14 2205 07/03/14 1635 07/04/14 0347  WBC 7.6  --  7.0 6.2  NEUTROABS 3.6  --   --   --   HGB 14.9 17.0 14.7 13.8  HCT 45.1 50.0 44.2 42.1  MCV 97.0  --  98.9 98.1  PLT 176  --  166 180   Cardiac Enzymes:  Recent Labs Lab 07/03/14 0033 07/03/14 0243 07/03/14 0910 07/03/14 1505  TROPONINI 0.04* 0.04* 0.03 0.03   BNP (last 3 results)  Recent Labs  07/02/14 2348  BNP 873.2*    ProBNP (last 3 results) No results for input(s): PROBNP in the last 8760 hours.  CBG: No results for input(s): GLUCAP in the last 168 hours.  Recent Results (from the past 240 hour(s))  MRSA PCR Screening     Status: None   Collection Time: 07/03/14  3:53 AM  Result Value Ref Range Status   MRSA by PCR NEGATIVE NEGATIVE Final    Comment:        The GeneXpert MRSA Assay (FDA approved for NASAL specimens only), is one component of a comprehensive MRSA colonization surveillance program. It is not intended to diagnose MRSA infection nor to guide or monitor treatment for MRSA infections.       Studies: Dg Chest 2 View  07/02/2014   CLINICAL DATA:  Shortness of breath, weakness, nonproductive cough for 4 days, increasing.  EXAM: CHEST  2 VIEW  COMPARISON:  06/23/2013  FINDINGS: Cardiomegaly is unchanged. Peribronchial and interstitial opacities are unchanged, may reflect pulmonary edema, however appears chronic. The previous right lower lobe consolidation has diminished, however minimal residual opacities persist. No new consolidation. There may be small pleural effusions. No acute osseous abnormalities.  IMPRESSION: 1. Right basilar  atelectasis or scarring. 2. Stable cardiomegaly. Interstitial opacities concerning for pulmonary edema, however this may be chronic and is unchanged compared to prior.   Electronically Signed   By: Rubye Oaks M.D.   On: 07/02/2014 22:32   Nm Hepatobiliary Liver Func  07/03/2014   CLINICAL DATA:  Gallbladder wall thickening on ultrasound. No gallstones.  EXAM: NUCLEAR MEDICINE HEPATOBILIARY IMAGING  TECHNIQUE: Sequential images of the abdomen were obtained out to 60 minutes following intravenous administration of radiopharmaceutical.  RADIOPHARMACEUTICALS:  5.1 Tc-33m Choletec IV  COMPARISON:  None.  FINDINGS: There is prompt uptake and excretion of the radiopharmaceutical by the liver. There is prompt and increasing activity in the gallbladder. There is activity in central extrahepatic bile ducts and small bowel before 1 hour.  IMPRESSION: 1. Patency of cystic and common bile ducts.   Electronically Signed   By: Corlis Leak M.D.   On: 07/03/2014 16:11   US Abdomen Limited Ruq  07/03/2014   CLINICAL DATA:  Acute onset of right upper quadrant abdominal pain. Initial encounter.  EXAM: US ABDOMEN LIMITED - RIGHT UPPER QUADRANT  COMPARISON:  Right upper quadrant ultrasound performed 06/21/2013  FINDINGS: Gallbladder:  Diffuse gallbladder wall thickening is seen, with the gallbladder wall measuring up to 1.2 cm in thickness. No definite stones are characterized.  However, a positive ultrasonographic Murphy's sign is elicited. Findings are concerning for acute cholecystitis.  Common bile duct:  Diameter: 0.3 cm, within normal limits in caliber.  Liver:  No focal lesion identified. Within normal limits in parenchymal echogenicity.  A small right pleural effusion is noted.  IMPRESSION: 1. Diffuse gallbladder wall thickening noted, with a positive ultrasonographic Murphy's sign. Gallbladder wall thickening is worsened from the prior study. No definite stones seen. Findings are concerning for acute cholecystitis; this could reflect acalculous cholecystitis. 2. Small right pleural effusion noted.   Electronically Signed   By: Roanna Raider M.D.   On: 07/03/2014 01:28    Scheduled Meds: . antiseptic oral rinse  7 mL Mouth Rinse BID  . aspirin EC  325 mg Oral Daily  . budesonide (PULMICORT) nebulizer solution  0.25 mg Nebulization BID  . digoxin  0.25 mg Oral Daily  . levalbuterol  0.63 mg Nebulization TID  . [START ON 07/05/2014] lisinopril  2.5 mg Oral Daily  . metoprolol tartrate  12.5 mg Oral BID  . [START ON 07/05/2014] predniSONE  40 mg Oral Q breakfast  . sodium chloride  3 mL Intravenous Q12H   Continuous Infusions: . sodium chloride 75 mL/hr at 07/04/14 1347  . diltiazem (CARDIZEM) infusion 5 mg/hr (07/04/14 1400)  . heparin 1,250 Units/hr (07/03/14 2153)    Principal Problem:   Atrial fibrillation with RVR Active Problems:   Acute respiratory failure with hypoxia   Chest pain   Right upper quadrant pain   COPD exacerbation    Time spent: 30 min    Addelynn Batte, [facility]  [facility] Pager [phone]. If 7PM-7AM, please contact night-coverage at [email or web address], password [facility] 07/04/2014, 3:51 PM  LOS: 2 days

## 2014-07-04 NOTE — Progress Notes (Signed)
Subjective:  Patient denies any chest pain states breathing and abdominal pain has improved. States was drinking 120 ounce of beer every day until last 2 weeks. 2-D echo showed markedly depressed LV systolic function with severe functional mitral regurgitation probably secondary to tachycardia and alcohol induced. Again discussed with patient regarding TEE assisted cardioversion but wanted to be treated medically  Objective:  Vital Signs in the last 24 hours: Temp:  [97.8 F (36.6 C)-98.9 F (37.2 C)] 97.8 F (36.6 C) (06/03 0830) Pulse Rate:  [35-197] 60 (06/03 1100) Resp:  [0-34] 15 (06/03 1100) BP: (89-191)/(62-101) 99/69 mmHg (06/03 1100) SpO2:  [88 %-98 %] 97 % (06/03 1100) Weight:  [79.8 kg (175 lb 14.8 oz)] 79.8 kg (175 lb 14.8 oz) (06/03 0400)  Intake/Output from previous day: 06/02 0701 - 06/03 0700 In: 1073.3 [P.O.:120; I.V.:903.3; IV Piggyback:50] Out: 2650 [Urine:2650] Intake/Output from this shift: Total I/O In: 40.5 [I.V.:40.5] Out: 350 [Urine:350]  Physical Exam: Neck: no adenopathy, no carotid bruit, no JVD and supple, symmetrical, trachea midline Lungs: Decreased breath sound at bases Heart: irregularly irregular rhythm, S1, S2 normal and Soft systolic murmur noted Abdomen: Soft mild sounds present minimal tenderness right upper quadrant no guarding Extremities: extremities normal, atraumatic, no cyanosis or edema  Lab Results:  Recent Labs  07/03/14 1635 07/04/14 0347  WBC 7.0 6.2  HGB 14.7 13.8  PLT 166 180    Recent Labs  07/02/14 2205 07/04/14 0347  NA 142 138  K 4.2 4.1  CL 108 107  CO2  --  23  GLUCOSE 91 91  BUN 27* 25*  CREATININE 1.10 1.39*    Recent Labs  07/03/14 0910 07/03/14 1505  TROPONINI 0.03 0.03   Hepatic Function Panel  Recent Labs  07/03/14 0013 07/04/14 0347  PROT 6.2* 5.8*  ALBUMIN 3.4* 3.2*  AST 109* 324*  ALT 111* 275*  ALKPHOS 83 73  BILITOT 0.7 0.9  BILIDIR 0.2  --   IBILI 0.5  --    No results for  input(s): CHOL in the last 72 hours. No results for input(s): PROTIME in the last 72 hours.  Imaging: Imaging results have been reviewed and Dg Chest 2 View  07/02/2014   CLINICAL DATA:  Shortness of breath, weakness, nonproductive cough for 4 days, increasing.  EXAM: CHEST  2 VIEW  COMPARISON:  06/23/2013  FINDINGS: Cardiomegaly is unchanged. Peribronchial and interstitial opacities are unchanged, may reflect pulmonary edema, however appears chronic. The previous right lower lobe consolidation has diminished, however minimal residual opacities persist. No new consolidation. There may be small pleural effusions. No acute osseous abnormalities.  IMPRESSION: 1. Right basilar atelectasis or scarring. 2. Stable cardiomegaly. Interstitial opacities concerning for pulmonary edema, however this may be chronic and is unchanged compared to prior.   Electronically Signed   By: Rubye Oaks M.D.   On: 07/02/2014 22:32   Nm Hepatobiliary Liver Func  07/03/2014   CLINICAL DATA:  Gallbladder wall thickening on ultrasound. No gallstones.  EXAM: NUCLEAR MEDICINE HEPATOBILIARY IMAGING  TECHNIQUE: Sequential images of the abdomen were obtained out to 60 minutes following intravenous administration of radiopharmaceutical.  RADIOPHARMACEUTICALS:  5.1 Tc-30m Choletec IV  COMPARISON:  None.  FINDINGS: There is prompt uptake and excretion of the radiopharmaceutical by the liver. There is prompt and increasing activity in the gallbladder. There is activity in central extrahepatic bile ducts and small bowel before 1 hour.  IMPRESSION: 1. Patency of cystic and common bile ducts.   Electronically Signed   By:  Corlis Leak M.D.   On: 07/03/2014 16:11   US Abdomen Limited Ruq  07/03/2014   CLINICAL DATA:  Acute onset of right upper quadrant abdominal pain. Initial encounter.  EXAM: US ABDOMEN LIMITED - RIGHT UPPER QUADRANT  COMPARISON:  Right upper quadrant ultrasound performed 06/21/2013  FINDINGS: Gallbladder:  Diffuse gallbladder  wall thickening is seen, with the gallbladder wall measuring up to 1.2 cm in thickness. No definite stones are characterized. However, a positive ultrasonographic Murphy's sign is elicited. Findings are concerning for acute cholecystitis.  Common bile duct:  Diameter: 0.3 cm, within normal limits in caliber.  Liver:  No focal lesion identified. Within normal limits in parenchymal echogenicity.  A small right pleural effusion is noted.  IMPRESSION: 1. Diffuse gallbladder wall thickening noted, with a positive ultrasonographic Murphy's sign. Gallbladder wall thickening is worsened from the prior study. No definite stones seen. Findings are concerning for acute cholecystitis; this could reflect acalculous cholecystitis. 2. Small right pleural effusion noted.   Electronically Signed   By: Roanna Raider M.D.   On: 07/03/2014 01:28    Cardiac Studies:  Assessment/Plan:  Recurrent A. fib flutter with rapid ventricular response  Mild volume overload Severe dilated cardiomyopathy probably secondary to EtOH/tachycardia induced Atypical chest pain Possible acute cholecystitis Hypertension EtOH abuse Tobacco abuse Depression Anxiety disorder Plan Add low-dose ACE inhibitor status per orders And digoxin as per orders in view of low blood pressure Agree with beta blockers. Uptitrate Ace and beta blockers as blood pressure tolerates Dr. Algie Coffer on-call for weekend  LOS: 2 days    Rinaldo Cloud 07/04/2014, 12:04 PM

## 2014-07-05 DIAGNOSIS — I5023 Acute on chronic systolic (congestive) heart failure: Secondary | ICD-10-CM

## 2014-07-05 DIAGNOSIS — I340 Nonrheumatic mitral (valve) insufficiency: Secondary | ICD-10-CM

## 2014-07-05 LAB — COMPREHENSIVE METABOLIC PANEL
ALK PHOS: 72 U/L (ref 38–126)
ALT: 228 U/L — AB (ref 17–63)
AST: 169 U/L — ABNORMAL HIGH (ref 15–41)
Albumin: 3 g/dL — ABNORMAL LOW (ref 3.5–5.0)
Anion gap: 7 (ref 5–15)
BILIRUBIN TOTAL: 0.5 mg/dL (ref 0.3–1.2)
BUN: 20 mg/dL (ref 6–20)
CO2: 23 mmol/L (ref 22–32)
CREATININE: 1.07 mg/dL (ref 0.61–1.24)
Calcium: 8.5 mg/dL — ABNORMAL LOW (ref 8.9–10.3)
Chloride: 108 mmol/L (ref 101–111)
GFR calc Af Amer: >60 mL/min (ref >60)
GFR calc non Af Amer: >60 mL/min (ref >60)
Glucose, Bld: 124 mg/dL — ABNORMAL HIGH (ref 65–99)
POTASSIUM: 3.9 mmol/L (ref 3.5–5.1)
Sodium: 138 mmol/L (ref 135–145)
TOTAL PROTEIN: 5.6 g/dL — AB (ref 6.5–8.1)

## 2014-07-05 LAB — HEPARIN LEVEL (UNFRACTIONATED)
HEPARIN UNFRACTIONATED: 0.44 [IU]/mL (ref 0.30–0.70)
Heparin Unfractionated: 0.3 IU/mL (ref 0.30–0.70)

## 2014-07-05 LAB — CBC
HEMATOCRIT: 42.5 % (ref 39.0–52.0)
Hemoglobin: 13.8 g/dL (ref 13.0–17.0)
MCH: 32.4 pg (ref 26.0–34.0)
MCHC: 32.5 g/dL (ref 30.0–36.0)
MCV: 99.8 fL (ref 78.0–100.0)
Platelets: 175 10*3/uL (ref 150–400)
RBC: 4.26 MIL/uL (ref 4.22–5.81)
RDW: 14.1 % (ref 11.5–15.5)
WBC: 6.1 10*3/uL (ref 4.0–10.5)

## 2014-07-05 LAB — HEPATITIS PANEL, ACUTE
HCV Ab: <0.1 s/co ratio — AB (ref 0.0–0.9)
HEP B S AG: NEGATIVE — AB
Hep A IgM: NEGATIVE — AB
Hep B C IgM: NEGATIVE — AB

## 2014-07-05 MED ORDER — HEPARIN (PORCINE) IN NACL 100-0.45 UNIT/ML-% IJ SOLN
1350.0000 [IU]/h | INTRAMUSCULAR | Status: DC
  Administered 2014-07-05: 1350 [IU]/h via INTRAVENOUS
  Filled 2014-07-05: qty 250

## 2014-07-05 MED ORDER — METOPROLOL TARTRATE 25 MG PO TABS
25.0000 mg | ORAL_TABLET | Freq: Two times a day (BID) | ORAL | Status: DC
  Administered 2014-07-05 – 2014-07-07 (×5): 25 mg via ORAL
  Filled 2014-07-05 (×5): qty 1

## 2014-07-05 MED ORDER — LORAZEPAM 0.5 MG PO TABS
0.5000 mg | ORAL_TABLET | ORAL | Status: DC | PRN
  Administered 2014-07-05 – 2014-07-06 (×2): 0.5 mg via ORAL
  Filled 2014-07-05 (×2): qty 1

## 2014-07-05 MED ORDER — DIGOXIN 0.25 MG/ML IJ SOLN: 0.2500 mg | Freq: Four times a day (QID) | INTRAMUSCULAR | Status: DC

## 2014-07-05 MED ORDER — DIGOXIN 0.25 MG/ML IJ SOLN
0.2500 mg | Freq: Four times a day (QID) | INTRAMUSCULAR | Status: DC
  Filled 2014-07-05 (×4): qty 1

## 2014-07-05 MED ORDER — METOPROLOL TARTRATE 1 MG/ML IV SOLN
5.0000 mg | Freq: Once | INTRAVENOUS | Status: AC
  Administered 2014-07-05: 5 mg via INTRAVENOUS
  Filled 2014-07-05: qty 5

## 2014-07-05 MED ORDER — LORAZEPAM 2 MG/ML IJ SOLN
0.5000 mg | Freq: Once | INTRAMUSCULAR | Status: AC
  Administered 2014-07-05: 0.5 mg via INTRAVENOUS
  Filled 2014-07-05: qty 1

## 2014-07-05 MED ORDER — DIGOXIN 0.25 MG/ML IJ SOLN
0.2500 mg | Freq: Four times a day (QID) | INTRAMUSCULAR | Status: AC
Start: 2014-07-05 — End: 2014-07-05
  Administered 2014-07-05: 0.25 mg via INTRAVENOUS
  Filled 2014-07-05: qty 1

## 2014-07-05 MED ORDER — DIGOXIN 0.25 MG/ML IJ SOLN
0.2500 mg | Freq: Four times a day (QID) | INTRAMUSCULAR | Status: DC
  Filled 2014-07-05 (×2): qty 1

## 2014-07-05 MED ORDER — DIGOXIN 125 MCG PO TABS
0.2500 mg | ORAL_TABLET | Freq: Every day | ORAL | Status: DC
  Administered 2014-07-06 – 2014-07-08 (×3): 0.25 mg via ORAL
  Filled 2014-07-05 (×3): qty 2

## 2014-07-05 MED ORDER — DILTIAZEM HCL 60 MG PO TABS
60.0000 mg | ORAL_TABLET | Freq: Three times a day (TID) | ORAL | Status: DC
  Administered 2014-07-05 – 2014-07-06 (×3): 60 mg via ORAL
  Filled 2014-07-05 (×3): qty 1

## 2014-07-05 MED ORDER — DIGOXIN 0.25 MG/ML IJ SOLN
0.5000 mg | Freq: Once | INTRAMUSCULAR | Status: AC
  Administered 2014-07-05: 0.5 mg via INTRAVENOUS
  Filled 2014-07-05: qty 2

## 2014-07-05 NOTE — Progress Notes (Addendum)
ANTICOAGULATION CONSULT NOTE - Follow Up Consult  Pharmacy Consult for Heparin Indication: atrial fibrillation  Allergies  Allergen Reactions  . Bee Venom Anaphylaxis    Uses epi pen-- last used 08/23/11    Patient Measurements: Height:  (167.6 cm) Weight: 177 lb 14.6 oz (80.7 kg) IBW/kg (Calculated) : 63.8 Heparin Dosing Weight: actual weight  Vital Signs: Temp: 97.9 F (36.6 C) (06/03 2315) Temp Source: Oral (06/03 2315) BP: 112/73 mmHg (06/04 0600) Pulse Rate: 91 (06/04 0600)  Labs:  Recent Labs  07/02/14 2146 07/02/14 2205  07/03/14 0243  07/03/14 0910 07/03/14 1505 07/03/14 1635 07/03/14 2214 07/04/14 0347 07/05/14 0346  HGB 14.9 17.0  --   --   --   --   --  14.7  --  13.8 13.8  HCT 45.1 50.0  --   --   --   --   --  44.2  --  42.1 42.5  PLT 176  --   --   --   --   --   --  166  --  180 175  APTT  --   --   --  35  --   --   --   --   --   --   --   LABPROT 17.4*  --   --   --   --   --   --   --   --   --   --   INR 1.41  --   --   --   --   --   --   --   --   --   --   HEPARINUNFRC  --   --   --   --   < > 0.24*  --  0.45 0.41 0.37 0.30  CREATININE  --  1.10  --   --   --   --   --   --   --  1.39* 1.07  TROPONINI  --   --   < > 0.04*  --  0.03 0.03  --   --   --   --   < > = values in this interval not displayed.  Estimated Creatinine Clearance: 83.4 mL/min (by C-G formula based on Cr of 1.07).   Medications:  Infusions:  . diltiazem (CARDIZEM) infusion Stopped (07/04/14 1600)  . heparin 1,250 Units/hr (07/04/14 2258)    Assessment: [name] with admitted with chest pain and SOB, wheezing, cough, and RUQ pain.  PMH includes AFibs/p cardioversion, HTN, homelessness, alcoholism in remission, tobacco abuse.  HIDA scan negative.  Found to have acute systolic CHF, severe mitral valve regurgitation, and AFib likely d/t CHF.  Pharmacy is consulted to dose Heparin for AFib.   HL 0.30, remains therapeutic at low end of range  CBC: Hgb and Plt remain  stable and WNL  SCr improved to 1.07 with CrCl ~ 83 ml/min  AST/ALT remain elevated but improved   Goal of Therapy:  Heparin level 0.3-0.7 units/ml Monitor platelets by anticoagulation protocol: Yes   Plan:   Increase to heparin IV infusion at 1350 units/hr  Recheck heparin level in 6 hours  Daily heparin level and CBC  Continue to monitor H&H and platelets  Lynann Beaver PharmD, BCPS Pager [phone] 07/05/2014 7:16 AM    Addendum: 1400 Heparin level 0.44, remains therapeutic Heparin infusing at 1350 units/hr No complications or infusion problems per RN. Plan: Continue heparin IV infusion at 1350 units/hr Daily  heparin level and CBC Follow up long-term anticoagulation plans.  Lynann Beaver PharmD, BCPS Pager [phone] 07/05/2014 3:58 PM

## 2014-07-05 NOTE — Progress Notes (Signed)
Ref: No PCP Per Patient   Subjective:  Feeling better. Afebrile. Improving heart rate. Low EF noted on echocardiogram.  Objective:  Vital Signs in the last 24 hours: Temp:  [97.9 F (36.6 C)-98.2 F (36.8 C)] 98.1 F (36.7 C) (06/04 0800) Pulse Rate:  [30-117] 89 (06/04 0800) Cardiac Rhythm:  [-] Atrial fibrillation (06/04 0800) Resp:  [0-30] 27 (06/04 0800) BP: (85-120)/(51-84) 110/82 mmHg (06/04 0800) SpO2:  [90 %-99 %] 98 % (06/04 0808) Weight:  [80.7 kg (177 lb 14.6 oz)] 80.7 kg (177 lb 14.6 oz) (06/04 0500)  Physical Exam: BP Readings from Last 1 Encounters:  07/05/14 110/82    Wt Readings from Last 1 Encounters:  07/05/14 80.7 kg (177 lb 14.6 oz)    Weight change: 0.9 kg (1 lb 15.8 oz)  HEENT: Ryan/AT, Eyes- PERL, EOMI, Conjunctiva-Pink, Sclera-Non-icteric Neck: No JVD, No bruit, Trachea midline. Lungs:  Clear, Bilateral. Cardiac:  Regular rhythm, normal S1 and S2, no S3. II/VI systolic murmur. Abdomen:  Soft, non-tender. Extremities:  No edema present. No cyanosis. No clubbing. CNS: AxOx3, Cranial nerves grossly intact, moves all 4 extremities. Right handed. Skin: Warm and dry.   Intake/Output from previous day: 06/03 0701 - 06/04 0700 In: 1269.8 [P.O.:360; I.V.:909.8] Out: 1000 [Urine:1000]    Lab Results: BMET    Component Value Date/Time   NA 138 07/05/2014 0346   NA 138 07/04/2014 0347   NA 142 07/02/2014 2205   K 3.9 07/05/2014 0346   K 4.1 07/04/2014 0347   K 4.2 07/02/2014 2205   CL 108 07/05/2014 0346   CL 107 07/04/2014 0347   CL 108 07/02/2014 2205   CO2 23 07/05/2014 0346   CO2 23 07/04/2014 0347   CO2 20 10/19/2013 1409   GLUCOSE 124* 07/05/2014 0346   GLUCOSE 91 07/04/2014 0347   GLUCOSE 91 07/02/2014 2205   BUN 20 07/05/2014 0346   BUN 25* 07/04/2014 0347   BUN 27* 07/02/2014 2205   CREATININE 1.07 07/05/2014 0346   CREATININE 1.39* 07/04/2014 0347   CREATININE 1.10 07/02/2014 2205   CALCIUM 8.5* 07/05/2014 0346   CALCIUM 8.6*  07/04/2014 0347   CALCIUM 9.1 10/19/2013 1409   GFRNONAA >60 07/05/2014 0346   GFRNONAA 58* 07/04/2014 0347   GFRNONAA >90 10/19/2013 1409   GFRAA >60 07/05/2014 0346   GFRAA >60 07/04/2014 0347   GFRAA >90 10/19/2013 1409   CBC    Component Value Date/Time   WBC 6.1 07/05/2014 0346   RBC 4.26 07/05/2014 0346   HGB 13.8 07/05/2014 0346   HCT 42.5 07/05/2014 0346   PLT 175 07/05/2014 0346   MCV 99.8 07/05/2014 0346   MCH 32.4 07/05/2014 0346   MCHC 32.5 07/05/2014 0346   RDW 14.1 07/05/2014 0346   LYMPHSABS 2.9 07/02/2014 2146   MONOABS 0.8 07/02/2014 2146   EOSABS 0.1 07/02/2014 2146   BASOSABS 0.1 07/02/2014 2146   HEPATIC Function Panel  Recent Labs  07/03/14 0013 07/04/14 0347 07/05/14 0346  PROT 6.2* 5.8* 5.6*   HEMOGLOBIN A1C No components found for: HGA1C,  MPG CARDIAC ENZYMES Lab Results  Component Value Date   CKTOTAL 129 09/13/2009   CKMB 2.4 09/13/2009   TROPONINI 0.03 07/03/2014   TROPONINI 0.03 07/03/2014   TROPONINI 0.04* 07/03/2014   BNP No results for input(s): PROBNP in the last 8760 hours. TSH  Recent Labs  07/03/14 0014  TSH 3.175   CHOLESTEROL No results for input(s): CHOL in the last 8760 hours.  Scheduled Meds: .  antiseptic oral rinse  7 mL Mouth Rinse BID  . aspirin EC  325 mg Oral Daily  . budesonide (PULMICORT) nebulizer solution  0.25 mg Nebulization BID  . digoxin  0.25 mg Intravenous Q6H  . diltiazem  60 mg Oral 3 times per day  . levalbuterol  0.63 mg Nebulization TID  . metoprolol tartrate  12.5 mg Oral BID  . predniSONE  40 mg Oral Q breakfast  . sodium chloride  3 mL Intravenous Q12H   Continuous Infusions: . heparin 1,350 Units/hr (07/05/14 0732)   PRN Meds:.levalbuterol, ondansetron **OR** ondansetron (ZOFRAN) IV  Assessment/Plan: A. fib flutter with rapid ventricular response  Mild volume overload Severe dilated cardiomyopathy probably secondary to EtOH/tachycardia induced Atypical chest pain Possible  acute cholecystitis Hypertension EtOH abuse Tobacco abuse Depression Anxiety disorder  Continue B-blocker, Lanoxin and diltiazem.     LOS: 3 days    Orpah Cobb  MD  07/05/2014, 11:14 AM

## 2014-07-05 NOTE — Progress Notes (Signed)
Subjective: No abdominal pain.  Tolerating solid diet.  Objective: Vital signs in last 24 hours: Temp:  [97.9 F (36.6 C)-98.2 F (36.8 C)] 98.1 F (36.7 C) (06/04 0800) Pulse Rate:  [30-117] 89 (06/04 0800) Resp:  [0-30] 27 (06/04 0800) BP: (85-120)/(51-84) 110/82 mmHg (06/04 0800) SpO2:  [90 %-99 %] 98 % (06/04 0808) Weight:  [80.7 kg (177 lb 14.6 oz)] 80.7 kg (177 lb 14.6 oz) (06/04 0500) Last BM Date: 07/03/14  Intake/Output from previous day: 06/03 0701 - 06/04 0700 In: 1269.8 [P.O.:360; I.V.:909.8] Out: 1000 [Urine:1000] Intake/Output this shift:    PE: General- In NAD Abdomen-soft, not tender  Lab Results:   Recent Labs  07/04/14 0347 07/05/14 0346  WBC 6.2 6.1  HGB 13.8 13.8  HCT 42.1 42.5  PLT 180 175   BMET  Recent Labs  07/04/14 0347 07/05/14 0346  NA 138 138  K 4.1 3.9  CL 107 108  CO2 23 23  GLUCOSE 91 124*  BUN 25* 20  CREATININE 1.39* 1.07  CALCIUM 8.6* 8.5*   PT/INR  Recent Labs  07/02/14 2146  LABPROT 17.4*  INR 1.41   Comprehensive Metabolic Panel:    Component Value Date/Time   NA 138 07/05/2014 0346   NA 138 07/04/2014 0347   K 3.9 07/05/2014 0346   K 4.1 07/04/2014 0347   CL 108 07/05/2014 0346   CL 107 07/04/2014 0347   CO2 23 07/05/2014 0346   CO2 23 07/04/2014 0347   BUN 20 07/05/2014 0346   BUN 25* 07/04/2014 0347   CREATININE 1.07 07/05/2014 0346   CREATININE 1.39* 07/04/2014 0347   GLUCOSE 124* 07/05/2014 0346   GLUCOSE 91 07/04/2014 0347   CALCIUM 8.5* 07/05/2014 0346   CALCIUM 8.6* 07/04/2014 0347   AST 169* 07/05/2014 0346   AST 324* 07/04/2014 0347   ALT 228* 07/05/2014 0346   ALT 275* 07/04/2014 0347   ALKPHOS 72 07/05/2014 0346   ALKPHOS 73 07/04/2014 0347   BILITOT 0.5 07/05/2014 0346   BILITOT 0.9 07/04/2014 0347   PROT 5.6* 07/05/2014 0346   PROT 5.8* 07/04/2014 0347   ALBUMIN 3.0* 07/05/2014 0346   ALBUMIN 3.2* 07/04/2014 0347     Studies/Results: Nm Hepatobiliary Liver  Func  07/03/2014   CLINICAL DATA:  Gallbladder wall thickening on ultrasound. No gallstones.  EXAM: NUCLEAR MEDICINE HEPATOBILIARY IMAGING  TECHNIQUE: Sequential images of the abdomen were obtained out to 60 minutes following intravenous administration of radiopharmaceutical.  RADIOPHARMACEUTICALS:  5.1 Tc-42m Choletec IV  COMPARISON:  None.  FINDINGS: There is prompt uptake and excretion of the radiopharmaceutical by the liver. There is prompt and increasing activity in the gallbladder. There is activity in central extrahepatic bile ducts and small bowel before 1 hour.  IMPRESSION: 1. Patency of cystic and common bile ducts.   Electronically Signed   By: Corlis Leak M.D.   On: 07/03/2014 16:11    Anti-infectives: Anti-infectives    Start     Dose/Rate Route Frequency Ordered Stop   07/03/14 0400  piperacillin-tazobactam (ZOSYN) IVPB 3.375 g  Status:  Discontinued     3.375 g 12.5 mL/hr over 240 Minutes Intravenous Every 8 hours 07/03/14 0259 07/03/14 1919   07/03/14 0200  levofloxacin (LEVAQUIN) IVPB 750 mg  Status:  Discontinued     750 mg 100 mL/hr over 90 Minutes Intravenous Every 24 hours 07/03/14 0153 07/03/14 0256      Assessment Right upper quadrant pain-resolved; HIDA negative; no clinical evidence of acute cholecystitis; no indication  for urgent cholecystectomy.:   Atrial fibrillation with RVR     COPD exacerbation   Mitral valve regurgitation   Acute systolic congestive heart failure    LOS: 3 days   Plan: Can see Dr. Michaell Cowing in our office [phone] for appointment) after discharge to discuss elective cholecystectomy for possible chronic cholecystitis.   Chip Canepa J 07/05/2014

## 2014-07-05 NOTE — Progress Notes (Addendum)
Pt HR sustaining in the 120's-140's, A-fib w/ RVR. BP 113/88.  MD made aware.  Per MD; give evening dose of lopressor and ativan.

## 2014-07-05 NOTE — Progress Notes (Signed)
[facility] PROGRESS NOTE  [name] ZOX:[id number] DOB: Oct 03, 1965 DOA: 07/02/2014 PCP: No PCP Per Patient  Brief Summary  The patient is a 49 yo M with history of a-fib s/p cardioversion, HTN, homelessness, alcoholism in remission, tobacco abuse who presented with chest pain and SOB for the last 4-5 days.  He also endorsed wheezing and cough.  He was found to have some RUQ pain with positive Murphy's sign and RUQ [name] was concerning for acute cholecystitis, however, HIDA scan demonstrated contrast in the gallbladder and biliary tree emptying freely.    Assessment/Plan  Acute systolic congestive heart failure likely due to previous EtOH use  -  Echocardiogram demonstrates a moderately dilated left ventricle with severely reduced systolic function, EF 15%, and no regional wall motion abnormalities. He has severe mitral valve regurgitation, severely dilated left atrium, dilated right ventricle with reduced systolic function, and a moderately to severely dilated right atrium. His peak PA pressure was 40 mmHg -  Okay to continue ACEI -  Continue metoprolol at current dose due to hypotension -  Hold lasix  -  Digoxin added 6/3  Atrial fibrillation, patient declining cardioversion at this time -  CHADs2vasc = 2  (CHF, HTN) -  HASBLED = 4 (HTN, Abnl renal/liver, EtOH) -  Continue heparin drip and consider transitioning to NOAC, but defer A/C to cardiology.  Previously on eliquis so I will order CM to find out if there is a program he may qualify for to receive this medication -  Continue metoprolol  -  May reload dig if still tachycardic -  Try to avoid using diltiazem gtt given severity of heart failure -  Appreciate cardiology assistance  Severe mitral valve regurgitation -  Judicious use of IVF -  Defer further management to cardiology and possible CT surgery  Chest pain/tightness, may be related to tachycardia but may also be due to COPD exacerbation.  He had flat but mildly  elevated troponins and no regional wall motion abnormalities on ECHO to suggest acute ischemia. -  Continue aspirin, beta blocker -  No statin secondary to transaminitis   Acute COPD exacerbation -  Outpatient PFTs -  Continue xopenex and budesonide  -  Start Arantxa Piercey course of prednisone  Possible acute cholecystitis, not a good candidate for surgery given new onset acute systolic heart failure with ongoing chest pains. -  Appreciate general surgery and interventional radiology assistance -  Patient currently undergoing HIDA scan:  Neg -  D/c'd zosyn on 6/2  Transaminitis may be secondary to acute hepatitis or congestion from heart failure or surreptitious alcohol use.   Rising, however.   -  Acute hepatitis panel negative -  Tylenol level negative  Mild AKI likely due to dehydration and ACEI, resolved -  resume ACEI -  Hold diuretics  Diet:  Low sodium Access:  PIV IVF:  Yes Proph:  Heparin  Code Status: Full Family Communication: patient and his friend [name] Disposition Plan:  Still trying to rate control    Consultants:  Cardiology  General surgery  Interventional radiology  Procedures:  Chest x-ray  Right upper quadrant ultrasound  Echocardiogram  Antibiotics:  Zosyn 6/2   HPI/Subjective:  Chest pain and breathing are better with nebulizer treatments.  Denies abdominal pain and nausea. Objective: Filed Vitals:   07/05/14 0500 07/05/14 0600 07/05/14 0800 07/05/14 0808  BP:  112/73 110/82   Pulse:  91 89   Temp:      TempSrc:      Resp:  25 27   Height:      Weight: 80.7 kg (177 lb 14.6 oz)     SpO2:  95% 99% 98%    Intake/Output Summary (Last 24 hours) at 07/05/14 0838 Last data filed at 07/05/14 0600  Gross per 24 hour  Intake 1257.25 ml  Output    650 ml  Net 607.25 ml   Filed Weights   07/03/14 0500 07/04/14 0400 07/05/14 0500  Weight: 80.3 kg (177 lb 0.5 oz) 79.8 kg (175 lb 14.8 oz) 80.7 kg (177 lb 14.6 oz)    Exam:   General:   Adult male, No acute distress  HEENT:  NCAT, MMM  Cardiovascular:  IRRR, tachycardic to 120s and 130s on tele, nl S1, S2 no mrg, 2+ pulses, warm extremities  Respiratory:  Diminished bilateral breath sounds with some rales and very diminished BS at the right base, no rhonchi, no increased WOB  Abdomen:   NABS, soft, NT/ND.  Negative Murphy's sign on my exam  MSK:   Normal tone and bulk, no LEE  Neuro:  Grossly moves all extremities  Data Reviewed: Basic Metabolic Panel:  Recent Labs Lab 07/02/14 2146 07/02/14 2205 07/04/14 0347 07/05/14 0346  NA  --  142 138 138  K  --  4.2 4.1 3.9  CL  --  108 107 108  CO2  --   --  23 23  GLUCOSE  --  91 91 124*  BUN  --  27* 25* 20  CREATININE  --  1.10 1.39* 1.07  CALCIUM  --   --  8.6* 8.5*  MG 2.0  --   --   --    Liver Function Tests:  Recent Labs Lab 07/03/14 0013 07/04/14 0347 07/05/14 0346  AST 109* 324* 169*  ALT 111* 275* 228*  ALKPHOS 83 73 72  BILITOT 0.7 0.9 0.5  PROT 6.2* 5.8* 5.6*  ALBUMIN 3.4* 3.2* 3.0*    Recent Labs Lab 07/03/14 0013  LIPASE 16*   No results for input(s): AMMONIA in the last 168 hours. CBC:  Recent Labs Lab 07/02/14 2146 07/02/14 2205 07/03/14 1635 07/04/14 0347 07/05/14 0346  WBC 7.6  --  7.0 6.2 6.1  NEUTROABS 3.6  --   --   --   --   HGB 14.9 17.0 14.7 13.8 13.8  HCT 45.1 50.0 44.2 42.1 42.5  MCV 97.0  --  98.9 98.1 99.8  PLT 176  --  166 180 175   Cardiac Enzymes:  Recent Labs Lab 07/03/14 0033 07/03/14 0243 07/03/14 0910 07/03/14 1505  TROPONINI 0.04* 0.04* 0.03 0.03   BNP (last 3 results)  Recent Labs  07/02/14 2348  BNP 873.2*    ProBNP (last 3 results) No results for input(s): PROBNP in the last 8760 hours.  CBG: No results for input(s): GLUCAP in the last 168 hours.  Recent Results (from the past 240 hour(s))  MRSA PCR Screening     Status: None   Collection Time: 07/03/14  3:53 AM  Result Value Ref Range Status   MRSA by PCR NEGATIVE  NEGATIVE Final    Comment:        The GeneXpert MRSA Assay (FDA approved for NASAL specimens only), is one component of a comprehensive MRSA colonization surveillance program. It is not intended to diagnose MRSA infection nor to guide or monitor treatment for MRSA infections.      Studies: Nm Hepatobiliary Liver Func  07/03/2014   CLINICAL DATA:  Gallbladder wall thickening  on ultrasound. No gallstones.  EXAM: NUCLEAR MEDICINE HEPATOBILIARY IMAGING  TECHNIQUE: Sequential images of the abdomen were obtained out to 60 minutes following intravenous administration of radiopharmaceutical.  RADIOPHARMACEUTICALS:  5.1 Tc-59m Choletec IV  COMPARISON:  None.  FINDINGS: There is prompt uptake and excretion of the radiopharmaceutical by the liver. There is prompt and increasing activity in the gallbladder. There is activity in central extrahepatic bile ducts and small bowel before 1 hour.  IMPRESSION: 1. Patency of cystic and common bile ducts.   Electronically Signed   By: Corlis Leak M.D.   On: 07/03/2014 16:11    Scheduled Meds: . antiseptic oral rinse  7 mL Mouth Rinse BID  . aspirin EC  325 mg Oral Daily  . budesonide (PULMICORT) nebulizer solution  0.25 mg Nebulization BID  . digoxin  0.25 mg Oral Daily  . levalbuterol  0.63 mg Nebulization TID  . lisinopril  2.5 mg Oral Daily  . metoprolol tartrate  12.5 mg Oral BID  . predniSONE  40 mg Oral Q breakfast  . sodium chloride  3 mL Intravenous Q12H   Continuous Infusions: . diltiazem (CARDIZEM) infusion Stopped (07/04/14 1600)  . heparin 1,350 Units/hr (07/05/14 0732)    Principal Problem:   Atrial fibrillation with RVR Active Problems:   Acute respiratory failure with hypoxia   Chest pain   Right upper quadrant pain   COPD exacerbation    Time spent: 30 min    Fredna Stricker, [facility]  [facility] Pager [phone]. If 7PM-7AM, please contact night-coverage at [email or web address], password [facility] 07/05/2014, 8:38 AM  LOS: 3 days

## 2014-07-06 DIAGNOSIS — R1011 Right upper quadrant pain: Secondary | ICD-10-CM

## 2014-07-06 DIAGNOSIS — I5021 Acute systolic (congestive) heart failure: Principal | ICD-10-CM

## 2014-07-06 DIAGNOSIS — I4891 Unspecified atrial fibrillation: Secondary | ICD-10-CM

## 2014-07-06 DIAGNOSIS — J9601 Acute respiratory failure with hypoxia: Secondary | ICD-10-CM

## 2014-07-06 LAB — COMPREHENSIVE METABOLIC PANEL
ALT: 172 U/L — AB (ref 17–63)
AST: 88 U/L — ABNORMAL HIGH (ref 15–41)
Albumin: 2.9 g/dL — ABNORMAL LOW (ref 3.5–5.0)
Alkaline Phosphatase: 73 U/L (ref 38–126)
Anion gap: 8 (ref 5–15)
BILIRUBIN TOTAL: 0.4 mg/dL (ref 0.3–1.2)
BUN: 14 mg/dL (ref 6–20)
CO2: 23 mmol/L (ref 22–32)
Calcium: 8.6 mg/dL — ABNORMAL LOW (ref 8.9–10.3)
Chloride: 108 mmol/L (ref 101–111)
Creatinine, Ser: 0.82 mg/dL (ref 0.61–1.24)
GFR calc Af Amer: >60 mL/min (ref >60)
GLUCOSE: 114 mg/dL — AB (ref 65–99)
Potassium: 4 mmol/L (ref 3.5–5.1)
Sodium: 139 mmol/L (ref 135–145)
TOTAL PROTEIN: 5.6 g/dL — AB (ref 6.5–8.1)

## 2014-07-06 LAB — CBC
HEMATOCRIT: 42.2 % (ref 39.0–52.0)
Hemoglobin: 14 g/dL (ref 13.0–17.0)
MCH: 32.6 pg (ref 26.0–34.0)
MCHC: 33.2 g/dL (ref 30.0–36.0)
MCV: 98.4 fL (ref 78.0–100.0)
PLATELETS: 185 10*3/uL (ref 150–400)
RBC: 4.29 MIL/uL (ref 4.22–5.81)
RDW: 13.6 % (ref 11.5–15.5)
WBC: 8.7 10*3/uL (ref 4.0–10.5)

## 2014-07-06 LAB — HEPARIN LEVEL (UNFRACTIONATED)
HEPARIN UNFRACTIONATED: 0.67 [IU]/mL (ref 0.30–0.70)
Heparin Unfractionated: 0.21 IU/mL — ABNORMAL LOW (ref 0.30–0.70)

## 2014-07-06 MED ORDER — HEPARIN (PORCINE) IN NACL 100-0.45 UNIT/ML-% IJ SOLN
1500.0000 [IU]/h | INTRAMUSCULAR | Status: DC
  Administered 2014-07-06: 1500 [IU]/h via INTRAVENOUS
  Filled 2014-07-06: qty 250

## 2014-07-06 MED ORDER — DILTIAZEM HCL ER COATED BEADS 240 MG PO CP24
240.0000 mg | ORAL_CAPSULE | Freq: Every day | ORAL | Status: DC
  Administered 2014-07-06 – 2014-07-07 (×2): 240 mg via ORAL
  Filled 2014-07-06 (×2): qty 1

## 2014-07-06 MED ORDER — FUROSEMIDE 10 MG/ML IJ SOLN
40.0000 mg | Freq: Once | INTRAMUSCULAR | Status: AC
  Administered 2014-07-06: 40 mg via INTRAVENOUS
  Filled 2014-07-06: qty 4

## 2014-07-06 MED ORDER — HEPARIN BOLUS VIA INFUSION
1000.0000 [IU] | Freq: Once | INTRAVENOUS | Status: AC
  Administered 2014-07-06: 1000 [IU] via INTRAVENOUS
  Filled 2014-07-06: qty 1000

## 2014-07-06 NOTE — Progress Notes (Addendum)
ANTICOAGULATION CONSULT NOTE - Follow Up Consult  Pharmacy Consult for Heparin Indication: atrial fibrillation  Allergies  Allergen Reactions  . Bee Venom Anaphylaxis    Uses epi pen-- last used 08/23/11    Patient Measurements: Height: 5\' 6"  (167.6 cm) Weight: 183 lb 3.2 oz (83.1 kg) IBW/kg (Calculated) : 63.8 Heparin Dosing Weight: actual weight  Vital Signs: Temp: 98.3 F (36.8 C) (06/05 0700) Temp Source: Oral (06/05 0700) BP: 124/87 mmHg (06/05 0700) Pulse Rate: 78 (06/05 0700)  Labs:  Recent Labs  07/03/14 0910 07/03/14 1505  07/04/14 0347 07/05/14 0346 07/05/14 1358 07/06/14 0408 07/06/14 0438  HGB  --   --   < > 13.8 13.8  --  14.0  --   HCT  --   --   < > 42.1 42.5  --  42.2  --   PLT  --   --   < > 180 175  --  185  --   HEPARINUNFRC 0.24*  --   < > 0.37 0.30 0.44  --  0.21*  CREATININE  --   --   --  1.39* 1.07  --  0.82  --   TROPONINI 0.03 0.03  --   --   --   --   --   --   < > = values in this interval not displayed.  Estimated Creatinine Clearance: 110.2 mL/min (by C-G formula based on Cr of 0.82).   Medications:  Infusions:  . heparin 1,350 Units/hr (07/05/14 1700)    Assessment: 51 yoM with admitted with chest pain and SOB, wheezing, cough, and RUQ pain.  PMH includes AFibs/p cardioversion, HTN, homelessness, alcoholism in remission, tobacco abuse.  HIDA scan negative.  Found to have acute systolic CHF, severe mitral valve regurgitation, and AFib likely d/t CHF.  Pharmacy is consulted to dose Heparin for AFib.   HL 0.21, decreased to sub-therapeutic level  Decreased despite previously stable/therapeutic levels with rate increase 6/4  RN reports no interruptions or infusion problems.  CBC: Hgb and Plt remain stable and WNL  SCr decreased to 0.82, CrCl >100 ml/min  AST/ALT remain elevated but improved  No bleeding or complications reported.   Goal of Therapy:  Heparin level 0.3-0.7 units/ml Monitor platelets by anticoagulation  protocol: Yes   Plan:  Give heparin 1000 units bolus IV x 1  Increase to heparin IV infusion at 1500 units/hr  Recheck heparin level in 6 hours  Daily heparin level and CBC  Continue to monitor H&H and platelets  Lynann Beaver PharmD, BCPS Pager [phone] 07/06/2014 8:29 AM    Addendum: Recheck 1500  Heparin level 0.67, therapeutic Heparin bolus given and increased to rate of 1500 units/hr. No complications or infusion issues per RN. Plan: Continue heparin IV infusion at 1500 units/hr Recheck heparin level in 6 hours to confirm therapeutic rate. Daily heparin level and CBC Follow up long-term anticoagulation plans.  Lynann Beaver PharmD, BCPS Pager [phone] 07/06/2014 4:16 PM

## 2014-07-06 NOTE — Progress Notes (Signed)
PROGRESS NOTE  [name] VOZ:[id number] DOB: 1965-06-11 DOA: 07/02/2014 PCP: No PCP Per Patient  Summary: 49 year old man with history of atrial fibrillation, cardioversion, alcoholism in remission, not taking any medications, presented with chest pain, shortness of breath, right upper quadrant pain with positive Murphy sign, right upper quadrant ultrasound concerning for acute cholecystitis. He was admitted for acute hypoxic respiratory failure, atrial fibrillation with rapid ventricular response, acute heart failure, atypical chest pain, right upper quadrant pain. He was seen by cardiology as well as general surgery and interventional radiology. In regard to right upper quadrant pain, recommendations are for outpatient follow-up with general surgery. Percutaneous cholecystostomy was not felt to be indicated.  Assessment/Plan: 1. Acute systolic congestive heart failure, likely related to previous alcohol use with profound systolic dysfunction, LVEF 15%. Biventricular heart failure. Likely alcohol induced cardiomyopathy versus tachycardias induced. Continue ACE inhibitor, metoprolol, digoxin as per cardiology. Appears stable. 2. Severe mitral valve regurgitation. Management per cardiology. 3. Atrial fibrillation with rapid ventricular response. Patient reports plan for cardioversion 6/7. CHADs2vasc = 2 (CHF, HTN). Continue anticoagulation per cardiology. Case management involved as patient was previously on Eliquis. 4. Acute hypoxic respiratory failure. 5. Atypical chest pain. Felt to be secondary to COPD exacerbation. Flat, mildly elevated troponins, no regional wall motion abnormalities, no evidence of acute ischemia. Continue aspirin, beta blocker. No statin secondary to transaminitis. 6. Acute COPD exacerbation. Appears resolved. Continue broncho-dilators, budesonide, short course of prednisone. Follow-up as an outpatient. 7. Possible acute cholecystitis, right upper quadrant pain. Suspect  chronic cholecystitis. HIDA negative. percutaneous cholecystostomy was considered but interventional radiology recommended against given results of HIDA scan.  No clinical evidence of acute cholecystitis. No indication for urgent cholecystectomy per general surgery. Not a good candidate for surgery because of heart failure. Can see Dr. Michaell Cowing after discharge to discuss elective cholecystectomy for possible chronic cholecystitis. 8. Transaminitis. Trending down. Acute hepatitis panel negative. Suspect alcoholic hepatitis versus passive congestion from heart failure. 9. Acute kidney injury. Secondary dehydration. Resolved. ACE inhibitor has been restarted 10. Alcohol abuse, none in a couple of weeks 11.  tobacco dependence in remission    Appreciate cardiology management of acute systolic congestive heart failure, atrial fibrillation, severe mitral valve regurgitation, atypical chest pain. Continue Lasix, rate control agents as per cardiology. Cardioversion now planned.  Wean oxygen as tolerated.  Code Status: full code DVT prophylaxis: heparin infusion Family Communication: none present, pt alert, understands plan Disposition Plan: Lives with uncle.  Brendia Sacks, MD  [facility]  Pager [phone] If 7PM-7AM, please contact night-coverage at [email or web address], password [facility] 07/06/2014, 11:21 AM  LOS: 4 days   Consultants:  Cardiology  General surgery  Interventional radiology  Procedures:  Chest x-ray  Right upper quadrant ultrasound  Echocardiogram Study Conclusions  - Left ventricle: The cavity size was mildly to moderately dilated. Systolic function was severely reduced. Wall motion was normal; there were no regional wall motion abnormalities. - Mitral valve: There was severe regurgitation directed eccentrically and posteriorly. - Left atrium: The atrium was severely dilated. - Right ventricle: The cavity size was dilated. Wall thickness was normal. Systolic  function was reduced. - Right atrium: The atrium was moderately to severely dilated. - Pulmonary arteries: Systolic pressure was mildly to moderately increased. PA peak pressure: 40 mm Hg (S).  Antibiotics:  Zosyn 6/2  HPI/Subjective: Rapid atrial fibrillation last evening. Treated with beta blocker and Ativan.  Feeling ok. Breathing fine. Eating fine.  Objective: Filed Vitals:   07/06/14 0700 07/06/14 8756 07/06/14 0826 07/06/14  0913  BP: 124/87   132/71  Pulse: 78   78  Temp: 98.3 F (36.8 C)     TempSrc: Oral     Resp: 20     Height:      Weight: 83.1 kg (183 lb 3.2 oz)     SpO2: 100% 99% 99%     Intake/Output Summary (Last 24 hours) at 07/06/14 1121 Last data filed at 07/06/14 1106  Gross per 24 hour  Intake  303.3 ml  Output   2150 ml  Net -1846.7 ml     Filed Weights   07/04/14 0400 07/05/14 0500 07/06/14 0700  Weight: 79.8 kg (175 lb 14.8 oz) 80.7 kg (177 lb 14.6 oz) 83.1 kg (183 lb 3.2 oz)    Exam:      afebrile, vital signs stable General:  Appears calm and comfortable Cardiovascular: RRR, no m/r/g. No LE edema. Respiratory: CTA bilaterally, no w/r/r. Normal respiratory effort. Abdomen: mild RUQ pain with palpation Musculoskeletal: grossly normal tone BUE/BLE Psychiatric: grossly normal mood and affect, speech fluent and appropriate  New data reviewed:  weights do not appear to be accurate, fluctuate between 72 kg and 83 kg.   urine output 1425. -1.4 L since admission.  AST, ALT trending down  Pertinent data since admission  troponins flat   TSH, T3, T4 unremarkable   CBC unremarkable  CXR no acute disease  HIDA unremarkable  Pending data:    Scheduled Meds: . antiseptic oral rinse  7 mL Mouth Rinse BID  . aspirin EC  325 mg Oral Daily  . budesonide (PULMICORT) nebulizer solution  0.25 mg Nebulization BID  . digoxin  0.25 mg Oral Daily  . diltiazem  240 mg Oral Daily  . furosemide  40 mg Intravenous Once  . levalbuterol   0.63 mg Nebulization TID  . metoprolol tartrate  25 mg Oral BID  . predniSONE  40 mg Oral Q breakfast  . sodium chloride  3 mL Intravenous Q12H   Continuous Infusions: . heparin 1,500 Units/hr (07/06/14 0906)    Principal Problem:   Atrial fibrillation with RVR Active Problems:   Acute respiratory failure with hypoxia   Atrial flutter with rapid ventricular response   Chest pain   Right upper quadrant pain   COPD exacerbation   Mitral valve regurgitation   Acute systolic congestive heart failure   Time spent 20 minutes

## 2014-07-06 NOTE — Progress Notes (Signed)
Ref: No PCP Per Patient   Subjective:  Feeling better. Heart rate in 70's to 100's.  Objective:  Vital Signs in the last 24 hours: Temp:  [98 F (36.7 C)-98.9 F (37.2 C)] 98.3 F (36.8 C) (06/05 0700) Pulse Rate:  [62-120] 78 (06/05 0913) Cardiac Rhythm:  [-] Atrial fibrillation (06/04 2000) Resp:  [20-22] 20 (06/05 0700) BP: (113-137)/(71-88) 132/71 mmHg (06/05 0913) SpO2:  [95 %-100 %] 99 % (06/05 0826) Weight:  [83.1 kg (183 lb 3.2 oz)] 83.1 kg (183 lb 3.2 oz) (06/05 0700)  Physical Exam: BP Readings from Last 1 Encounters:  07/06/14 132/71    Wt Readings from Last 1 Encounters:  07/06/14 83.1 kg (183 lb 3.2 oz)    Weight change: 2.4 kg (5 lb 4.7 oz)  HEENT: [place]/AT, Eyes- PERL, EOMI, Conjunctiva-Pink, Sclera-Non-icteric Neck: No JVD, No bruit, Trachea midline. Lungs:  Crackles, Bilateral. Cardiac:  Regular rhythm, normal S1 and S2, no S3.  Abdomen:  Soft, non-tender. Extremities:  No edema present. No cyanosis. No clubbing. CNS: AxOx3, Cranial nerves grossly intact, moves all 4 extremities. Right handed. Skin: Warm and dry.   Intake/Output from previous day: 06/04 0701 - 06/05 0700 In: 303.3 [I.V.:303.3] Out: 1425 [Urine:1425]    Lab Results: BMET    Component Value Date/Time   NA 139 07/06/2014 0408   NA 138 07/05/2014 0346   NA 138 07/04/2014 0347   K 4.0 07/06/2014 0408   K 3.9 07/05/2014 0346   K 4.1 07/04/2014 0347   CL 108 07/06/2014 0408   CL 108 07/05/2014 0346   CL 107 07/04/2014 0347   CO2 23 07/06/2014 0408   CO2 23 07/05/2014 0346   CO2 23 07/04/2014 0347   GLUCOSE 114* 07/06/2014 0408   GLUCOSE 124* 07/05/2014 0346   GLUCOSE 91 07/04/2014 0347   BUN 14 07/06/2014 0408   BUN 20 07/05/2014 0346   BUN 25* 07/04/2014 0347   CREATININE 0.82 07/06/2014 0408   CREATININE 1.07 07/05/2014 0346   CREATININE 1.39* 07/04/2014 0347   CALCIUM 8.6* 07/06/2014 0408   CALCIUM 8.5* 07/05/2014 0346   CALCIUM 8.6* 07/04/2014 0347   GFRNONAA >60  07/06/2014 0408   GFRNONAA >60 07/05/2014 0346   GFRNONAA 58* 07/04/2014 0347   GFRAA >60 07/06/2014 0408   GFRAA >60 07/05/2014 0346   GFRAA >60 07/04/2014 0347   CBC    Component Value Date/Time   WBC 8.7 07/06/2014 0408   RBC 4.29 07/06/2014 0408   HGB 14.0 07/06/2014 0408   HCT 42.2 07/06/2014 0408   PLT 185 07/06/2014 0408   MCV 98.4 07/06/2014 0408   MCH 32.6 07/06/2014 0408   MCHC 33.2 07/06/2014 0408   RDW 13.6 07/06/2014 0408   LYMPHSABS 2.9 07/02/2014 2146   MONOABS 0.8 07/02/2014 2146   EOSABS 0.1 07/02/2014 2146   BASOSABS 0.1 07/02/2014 2146   HEPATIC Function Panel  Recent Labs  07/04/14 0347 07/05/14 0346 07/06/14 0408  PROT 5.8* 5.6* 5.6*   HEMOGLOBIN A1C No components found for: HGA1C,  MPG CARDIAC ENZYMES Lab Results  Component Value Date   CKTOTAL 129 09/13/2009   CKMB 2.4 09/13/2009   TROPONINI 0.03 07/03/2014   TROPONINI 0.03 07/03/2014   TROPONINI 0.04* 07/03/2014   BNP No results for input(s): PROBNP in the last 8760 hours. TSH  Recent Labs  07/03/14 0014  TSH 3.175   CHOLESTEROL No results for input(s): CHOL in the last 8760 hours.  Scheduled Meds: . antiseptic oral rinse  7 mL  Mouth Rinse BID  . aspirin EC  325 mg Oral Daily  . budesonide (PULMICORT) nebulizer solution  0.25 mg Nebulization BID  . digoxin  0.25 mg Oral Daily  . diltiazem  240 mg Oral Daily  . furosemide  40 mg Intravenous Once  . levalbuterol  0.63 mg Nebulization TID  . metoprolol tartrate  25 mg Oral BID  . predniSONE  40 mg Oral Q breakfast  . sodium chloride  3 mL Intravenous Q12H   Continuous Infusions: . heparin 1,500 Units/hr (07/06/14 0906)   PRN Meds:.levalbuterol, LORazepam, ondansetron **OR** ondansetron (ZOFRAN) IV  Assessment/Plan: A. fib flutter with rapid ventricular response-improving  Mild volume overload Severe dilated cardiomyopathy probably secondary to EtOH/tachycardia induced Atypical chest pain Possible acute  cholecystitis Hypertension EtOH abuse Tobacco abuse Depression Anxiety disorder   IV lasix, increase diltiazem dose. May need amiodarone or cardioversion or both   LOS: 4 days    Orpah Cobb  MD  07/06/2014, 11:08 AM

## 2014-07-07 DIAGNOSIS — J441 Chronic obstructive pulmonary disease with (acute) exacerbation: Secondary | ICD-10-CM

## 2014-07-07 LAB — CBC WITH DIFFERENTIAL/PLATELET
BASOS ABS: 0 10*3/uL (ref 0.0–0.1)
BASOS PCT: 0 % (ref 0–1)
EOS ABS: 0 10*3/uL (ref 0.0–0.7)
EOS PCT: 0 % (ref 0–5)
HEMATOCRIT: 44 % (ref 39.0–52.0)
Hemoglobin: 14.6 g/dL (ref 13.0–17.0)
Lymphocytes Relative: 13 % (ref 12–46)
Lymphs Abs: 1.3 10*3/uL (ref 0.7–4.0)
MCH: 32.3 pg (ref 26.0–34.0)
MCHC: 33.2 g/dL (ref 30.0–36.0)
MCV: 97.3 fL (ref 78.0–100.0)
Monocytes Absolute: 1.1 10*3/uL — ABNORMAL HIGH (ref 0.1–1.0)
Monocytes Relative: 11 % (ref 3–12)
NEUTROS ABS: 7.3 10*3/uL (ref 1.7–7.7)
NEUTROS PCT: 76 % (ref 43–77)
Platelets: 182 10*3/uL (ref 150–400)
RBC: 4.52 MIL/uL (ref 4.22–5.81)
RDW: 13.9 % (ref 11.5–15.5)
WBC: 9.7 10*3/uL (ref 4.0–10.5)

## 2014-07-07 LAB — BASIC METABOLIC PANEL
Anion gap: 11 (ref 5–15)
BUN: 15 mg/dL (ref 6–20)
CO2: 26 mmol/L (ref 22–32)
Calcium: 9.2 mg/dL (ref 8.9–10.3)
Chloride: 102 mmol/L (ref 101–111)
Creatinine, Ser: 0.75 mg/dL (ref 0.61–1.24)
GLUCOSE: 91 mg/dL (ref 65–99)
POTASSIUM: 4.2 mmol/L (ref 3.5–5.1)
Sodium: 139 mmol/L (ref 135–145)

## 2014-07-07 LAB — HEPARIN LEVEL (UNFRACTIONATED)
HEPARIN UNFRACTIONATED: 0.57 [IU]/mL (ref 0.30–0.70)
HEPARIN UNFRACTIONATED: 0.84 [IU]/mL — AB (ref 0.30–0.70)
HEPARIN UNFRACTIONATED: 0.59 [IU]/mL (ref 0.30–0.70)

## 2014-07-07 MED ORDER — HEPARIN BOLUS VIA INFUSION
2000.0000 [IU] | Freq: Once | INTRAVENOUS | Status: AC
  Administered 2014-07-07: 2000 [IU] via INTRAVENOUS
  Filled 2014-07-07: qty 2000

## 2014-07-07 MED ORDER — PREDNISONE 20 MG PO TABS
20.0000 mg | ORAL_TABLET | Freq: Every day | ORAL | Status: DC
  Administered 2014-07-08: 20 mg via ORAL
  Filled 2014-07-07: qty 1

## 2014-07-07 MED ORDER — HEPARIN (PORCINE) IN NACL 100-0.45 UNIT/ML-% IJ SOLN
1500.0000 [IU]/h | INTRAMUSCULAR | Status: AC
  Administered 2014-07-07 (×2): 1600 [IU]/h via INTRAVENOUS
  Filled 2014-07-07: qty 250

## 2014-07-07 MED ORDER — HEPARIN (PORCINE) IN NACL 100-0.45 UNIT/ML-% IJ SOLN
1900.0000 [IU]/h | INTRAMUSCULAR | Status: DC
  Administered 2014-07-07: 1900 [IU]/h via INTRAVENOUS
  Filled 2014-07-07: qty 250

## 2014-07-07 NOTE — Progress Notes (Signed)
PROGRESS NOTE  [name] ZOX:[id number] DOB: [birth year] DOA: 07/02/2014 PCP: No PCP Per Patient  Summary: 49 year old man with history of atrial fibrillation, cardioversion, alcoholism in remission, not taking any medications, presented with chest pain, shortness of breath, right upper quadrant pain with positive Murphy sign, right upper quadrant ultrasound concerning for acute cholecystitis. He was admitted for acute hypoxic respiratory failure, atrial fibrillation with rapid ventricular response, acute heart failure, atypical chest pain, right upper quadrant pain. He was seen by cardiology as well as general surgery and interventional radiology. In regard to right upper quadrant pain, recommendations are for outpatient follow-up with general surgery. Percutaneous cholecystostomy was not felt to be indicated.  Assessment/Plan: 1. Acute systolic congestive heart failure, likely related to previous alcohol use with profound systolic dysfunction, LVEF 15%. Biventricular heart failure. Appears stable. Likely alcohol induced cardiomyopathy versus tachycardia-induced. Continue ACE inhibitor, metoprolol, digoxin as per cardiology. Appears stable. 2. Severe mitral valve regurgitation. Management per cardiology. 3. Atrial fibrillation with rapid ventricular response. Rate-controlled. Cardiology plans cardioversion 6/7. CHADs2vasc = 2 (CHF, HTN). Continue anticoagulation per cardiology. Case management involved as patient was previously on Eliquis. 4. Acute hypoxic respiratory failure. 5. Atypical chest pain. Felt to be secondary to COPD exacerbation. Flat, mildly elevated troponins, no regional wall motion abnormalities, no evidence of acute ischemia. Continue aspirin, beta blocker. No statin secondary to transaminitis. 6. Acute COPD exacerbation. Resolved. Continue broncho-dilators, budesonide, short course of prednisone. Follow-up as an outpatient. 7. Possible acute cholecystitis, right upper quadrant  pain. Suspect chronic cholecystitis. HIDA negative. percutaneous cholecystostomy was considered but interventional radiology recommended against given results of HIDA scan.  No clinical evidence of acute cholecystitis. No indication for urgent cholecystectomy per general surgery. Not a good candidate for surgery because of heart failure. Can see Dr. Michaell Cowing after discharge to discuss elective cholecystectomy for possible chronic cholecystitis. 8. Transaminitis.  Acute hepatitis panel negative. Suspect alcoholic hepatitis versus passive congestion from heart failure. 9. Acute kidney injury. Secondary dehydration. Resolved. ACE inhibitor has been restarted 10. Alcohol abuse, none in a couple of weeks 11. Tobacco dependence in remission    Appreciate cardiology management of acute systolic congestive heart failure, atrial fibrillation, severe mitral valve regurgitation, atypical chest pain.   Wean oxygen as tolerated.  Anticipate discharge when cleared by cardiology.  Code Status: full code DVT prophylaxis: heparin infusion Family Communication: none present, pt alert, understands plan Disposition Plan: Lives with uncle.  Brendia Sacks, MD  [facility]  Pager [phone] If 7PM-7AM, please contact night-coverage at [email or web address], password [facility] 07/07/2014, 4:40 PM  LOS: 5 days   Consultants:  Cardiology  General surgery  Interventional radiology  Procedures:  Chest x-ray  Right upper quadrant ultrasound  Echocardiogram Study Conclusions  - Left ventricle: The cavity size was mildly to moderately dilated. Systolic function was severely reduced. Wall motion was normal; there were no regional wall motion abnormalities. - Mitral valve: There was severe regurgitation directed eccentrically and posteriorly. - Left atrium: The atrium was severely dilated. - Right ventricle: The cavity size was dilated. Wall thickness was normal. Systolic function was reduced. - Right  atrium: The atrium was moderately to severely dilated. - Pulmonary arteries: Systolic pressure was mildly to moderately increased. PA peak pressure: 40 mm Hg (S).  Antibiotics:  Zosyn 6/2  HPI/Subjective: Feels good. No chest pain, no SOB. Eating well.  Objective: Filed Vitals:   07/07/14 0425 07/07/14 0853 07/07/14 1411 07/07/14 1509  BP: 114/78  119/74   Pulse: 63  97   Temp:  97.9 F (36.6 C)  98.2 F (36.8 C)   TempSrc: Oral  Oral   Resp: 20  19   Height:      Weight: 79.379 kg (175 lb)     SpO2: 95% 94% 95% 98%    Intake/Output Summary (Last 24 hours) at 07/07/14 1640 Last data filed at 07/07/14 1412  Gross per 24 hour  Intake 1439.7 ml  Output   3200 ml  Net -1760.3 ml     Filed Weights   07/05/14 0500 07/06/14 0700 07/07/14 0425  Weight: 80.7 kg (177 lb 14.6 oz) 83.1 kg (183 lb 3.2 oz) 79.379 kg (175 lb)    Exam:    Afebrile, VSS General:  Appears comfortable, calm. Cardiovascular: Regular rate and rhythm, no murmur, rub or gallop. No lower extremity edema. Telemetry: atrial fibrillation  Respiratory: Clear to auscultation bilaterally, no wheezes, rales or rhonchi. Normal respiratory effort. Psychiatric: grossly normal mood and affect, speech fluent and appropriate  New data reviewed:  weights highly variable.    urine output 5375.   BMP and CBC stable.  Pertinent data since admission  troponins flat   TSH, T3, T4 unremarkable   CBC unremarkable  CXR no acute disease  HIDA unremarkable  Pending data:    Scheduled Meds: . antiseptic oral rinse  7 mL Mouth Rinse BID  . aspirin EC  325 mg Oral Daily  . budesonide (PULMICORT) nebulizer solution  0.25 mg Nebulization BID  . digoxin  0.25 mg Oral Daily  . diltiazem  240 mg Oral Daily  . levalbuterol  0.63 mg Nebulization TID  . metoprolol tartrate  25 mg Oral BID  . predniSONE  40 mg Oral Q breakfast  . sodium chloride  3 mL Intravenous Q12H   Continuous Infusions: . heparin 1,600  Units/hr (07/07/14 1639)    Principal Problem:   Atrial fibrillation with RVR Active Problems:   Acute respiratory failure with hypoxia   Atrial flutter with rapid ventricular response   Chest pain   Right upper quadrant pain   COPD exacerbation   Mitral valve regurgitation   Acute systolic congestive heart failure   Time spent 20 minutes

## 2014-07-07 NOTE — Progress Notes (Signed)
ANTICOAGULATION CONSULT NOTE - Follow Up Consult  Pharmacy Consult for Heparin Indication: atrial fibrillation  Allergies  Allergen Reactions  . Bee Venom Anaphylaxis    Uses epi pen-- last used 08/23/11    Patient Measurements: Height: 5\' 6"  (167.6 cm) Weight: 175 lb (79.379 kg) IBW/kg (Calculated) : 63.8 Heparin Dosing Weight: actual weight  Vital Signs: Temp: 98.2 F (36.8 C) (06/06 1411) Temp Source: Oral (06/06 1411) BP: 119/74 mmHg (06/06 1411) Pulse Rate: 97 (06/06 1411)  Labs:  Recent Labs  07/05/14 0346  07/06/14 0408  07/06/14 1500 07/06/14 2153 07/07/14 0440  HGB 13.8  --  14.0  --   --   --  14.6  HCT 42.5  --  42.2  --   --   --  44.0  PLT 175  --  185  --   --   --  182  HEPARINUNFRC 0.30  < >  --   < > 0.67 <0.10* 0.57  CREATININE 1.07  --  0.82  --   --   --  0.75  < > = values in this interval not displayed.  Estimated Creatinine Clearance: 110.6 mL/min (by C-G formula based on Cr of 0.75).   Medications:  Infusions:  . heparin 1,900 Units/hr (07/07/14 0425)    Assessment: 72 yoM with admitted with chest pain and SOB, wheezing, cough, and RUQ pain.  PMH includes AFibs/p cardioversion, HTN, homelessness, alcoholism in remission, tobacco abuse.  HIDA scan negative.  Found to have acute systolic CHF, severe mitral valve regurgitation, and AFib likely d/t CHF.  Pharmacy is consulted to dose Heparin for AFib.  Today: 6/6   0500 HL 0.57,therapeutic @ 1900 units/hr  RN reports no interruptions or infusion problems.  During morning rounds RN informed pharmacy that pt had been observed messing around with heparin pump  1300 HL >1 per lab  CBC: Hgb and Plt remain stable and WNL  SCr decreased to 0.75, CrCl >100 ml/min  AST/ALT remain elevated but improved  No bleeding or complications reported.    Goal of Therapy:  Heparin level 0.3-0.7 units/ml Monitor platelets by anticoagulation protocol: Yes   Plan:   Hold heparin for 1 hour and  restart @ 1600 units/hr  Recheck heparin level in 6 hours  Daily heparin level and CBC  Continue to monitor H&H and platelets  F/u anticoagulation plans     Arley Phenix RPh 07/07/2014, 2:35 PM Pager [phone]

## 2014-07-07 NOTE — Progress Notes (Signed)
ANTICOAGULATION CONSULT NOTE - Follow Up Consult  Pharmacy Consult for Heparin Indication: atrial fibrillation  Allergies  Allergen Reactions  . Bee Venom Anaphylaxis    Uses epi pen-- last used 08/23/11    Patient Measurements: Height: 5\' 6"  (167.6 cm) Weight: 175 lb (79.379 kg) IBW/kg (Calculated) : 63.8 Heparin Dosing Weight: actual weight  Vital Signs: Temp: 97.9 F (36.6 C) (06/06 0425) Temp Source: Oral (06/06 0425) BP: 114/78 mmHg (06/06 0425) Pulse Rate: 63 (06/06 0425)  Labs:  Recent Labs  07/05/14 0346  07/06/14 0408  07/06/14 1500 07/06/14 2153 07/07/14 0440  HGB 13.8  --  14.0  --   --   --  14.6  HCT 42.5  --  42.2  --   --   --  44.0  PLT 175  --  185  --   --   --  182  HEPARINUNFRC 0.30  < >  --   < > 0.67 <0.10* 0.57  CREATININE 1.07  --  0.82  --   --   --  0.75  < > = values in this interval not displayed.  Estimated Creatinine Clearance: 110.6 mL/min (by C-G formula based on Cr of 0.75).   Medications:  Infusions:  . heparin 1,900 Units/hr (07/07/14 0425)    Assessment: 88 yoM with admitted with chest pain and SOB, wheezing, cough, and RUQ pain.  PMH includes AFibs/p cardioversion, HTN, homelessness, alcoholism in remission, tobacco abuse.  HIDA scan negative.  Found to have acute systolic CHF, severe mitral valve regurgitation, and AFib likely d/t CHF.  Pharmacy is consulted to dose Heparin for AFib.   HL 0.57,therapeutic @ 1900 units/hr  RN reports no interruptions or infusion problems.  CBC: Hgb and Plt remain stable and WNL  SCr decreased to 0.75, CrCl >100 ml/min  AST/ALT remain elevated but improved  No bleeding or complications reported.    Goal of Therapy:  Heparin level 0.3-0.7 units/ml Monitor platelets by anticoagulation protocol: Yes   Plan:   Continue  heparin IV infusion at 1900 units/hr  Daily heparin level and CBC  Continue to monitor H&H and platelets  F/u anticoagulation plans     Arley Phenix  RPh 07/07/2014, 7:26 AM Pager [phone]

## 2014-07-07 NOTE — Progress Notes (Signed)
ANTICOAGULATION CONSULT NOTE - Follow Up Consult  Pharmacy Consult for Heparin Indication: atrial fibrillation  Allergies  Allergen Reactions  . Bee Venom Anaphylaxis    Uses epi pen-- last used 08/23/11    Patient Measurements: Height: 5\' 6"  (167.6 cm) Weight: 183 lb 3.2 oz (83.1 kg) IBW/kg (Calculated) : 63.8 Heparin Dosing Weight: actual weight  Vital Signs: Temp: 97.6 F (36.4 C) (06/05 2307) Temp Source: Oral (06/05 2307) BP: 109/78 mmHg (06/05 2307) Pulse Rate: 84 (06/05 2307)  Labs:  Recent Labs  07/04/14 0347 07/05/14 0346  07/06/14 0408 07/06/14 0438 07/06/14 1500 07/06/14 2153  HGB 13.8 13.8  --  14.0  --   --   --   HCT 42.1 42.5  --  42.2  --   --   --   PLT 180 175  --  185  --   --   --   HEPARINUNFRC 0.37 0.30  < >  --  0.21* 0.67 <0.10*  CREATININE 1.39* 1.07  --  0.82  --   --   --   < > = values in this interval not displayed.  Estimated Creatinine Clearance: 110.2 mL/min (by C-G formula based on Cr of 0.82).   Medications:  Infusions:  . heparin 1,900 Units/hr (07/07/14 0042)    Assessment: 8 yoM with admitted with chest pain and SOB, wheezing, cough, and RUQ pain.  PMH includes AFibs/p cardioversion, HTN, homelessness, alcoholism in remission, tobacco abuse.  HIDA scan negative.  Found to have acute systolic CHF, severe mitral valve regurgitation, and AFib likely d/t CHF.  Pharmacy is consulted to dose Heparin for AFib.   HL 0.21, decreased to sub-therapeutic level  Decreased despite previously stable/therapeutic levels with rate increase 6/4  RN reports no interruptions or infusion problems.  CBC: Hgb and Plt remain stable and WNL  SCr decreased to 0.82, CrCl >100 ml/min  AST/ALT remain elevated but improved  No bleeding or complications reported.  2153 HL=<0.10, RN reports No Problems with IV site and no interuptions   Goal of Therapy:  Heparin level 0.3-0.7 units/ml Monitor platelets by anticoagulation protocol: Yes    Plan:   Heparin 2000 unit rebolus  Increase to heparin IV infusion at 1900 units/hr  Recheck heparin level in 6 hours  Daily heparin level and CBC  Continue to monitor H&H and platelets   Lorenza Evangelist 07/07/2014 12:49 AM    Addendum: Recheck 1500  Heparin level 0.67, therapeutic Heparin bolus given and increased to rate of 1500 units/hr. No complications or infusion issues per RN. Plan: Continue heparin IV infusion at 1500 units/hr Recheck heparin level in 6 hours to confirm therapeutic rate. Daily heparin level and CBC Follow up long-term anticoagulation plans.  Lynann Beaver PharmD, BCPS Pager [phone] 07/07/2014 12:49 AM

## 2014-07-07 NOTE — Progress Notes (Signed)
Subjective:  Patient denies any chest pain or shortness of breath remains in atrial fibrillation with controlled ventricular response.  Patient now agreeable for TEE cardioversion.  Objective:  Vital Signs in the last 24 hours: Temp:  [97.6 F (36.4 C)-98.2 F (36.8 C)] 98.2 F (36.8 C) (06/06 1411) Pulse Rate:  [63-97] 97 (06/06 1411) Resp:  [19-20] 19 (06/06 1411) BP: (109-119)/(74-78) 119/74 mmHg (06/06 1411) SpO2:  [94 %-100 %] 98 % (06/06 1509) Weight:  [79.379 kg (175 lb)] 79.379 kg (175 lb) (06/06 0425)  Intake/Output from previous day: 06/05 0701 - 06/06 0700 In: 1319.7 [P.O.:1200; I.V.:119.7] Out: 5375 [Urine:5375] Intake/Output from this shift: Total I/O In: 840 [P.O.:840] Out: 1250 [Urine:1250]  Physical Exam: Neck: no adenopathy, no carotid bruit, no JVD and supple, symmetrical, trachea midline Lungs: clear to auscultation bilaterally Heart: irregularly irregular rhythm, S1, S2 normal and soft systolic murmur noted Abdomen: soft, non-tender; bowel sounds normal; no masses,  no organomegaly Extremities: extremities normal, atraumatic, no cyanosis or edema  Lab Results:  Recent Labs  07/06/14 0408 07/07/14 0440  WBC 8.7 9.7  HGB 14.0 14.6  PLT 185 182    Recent Labs  07/06/14 0408 07/07/14 0440  NA 139 139  K 4.0 4.2  CL 108 102  CO2 23 26  GLUCOSE 114* 91  BUN 14 15  CREATININE 0.82 0.75   No results for input(s): TROPONINI in the last 72 hours.  Invalid input(s): CK, MB Hepatic Function Panel  Recent Labs  07/06/14 0408  PROT 5.6*  ALBUMIN 2.9*  AST 88*  ALT 172*  ALKPHOS 73  BILITOT 0.4   No results for input(s): CHOL in the last 72 hours. No results for input(s): PROTIME in the last 72 hours.  Imaging: Imaging results have been reviewed and No results found.  Cardiac Studies:  Assessment/Plan:  chronicA. fib flutter with controlled ventricular response now. Severe dilated cardiomyopathy probably secondary to EtOH/tachycardia  induced Atypical chest pain Possible acute cholecystitis Hypertension EtOH abuse Tobacco abuse Depression Anxiety disorder  Plan  Discussed with patient regarding TEE cardioversion versus risks and benefits and agrees.  Patient has already spoken with Dr.Kadakia and he will schedule him for tomorrow.  LOS: 5 days    Rinaldo Cloud 07/07/2014, 5:21 PM

## 2014-07-07 NOTE — Progress Notes (Signed)
Brief Pharmacy note: Heparin  Heparin level at 2100 = 0.59 units/ml on 1600 units/hr  Level in range, no change to Heparin rate  Next level with am labs  Otho Bellows PharmD Pager [phone] 07/07/2014, 10:30 PM

## 2014-07-08 ENCOUNTER — Encounter (HOSPITAL_COMMUNITY)
Admission: EM | Disposition: A | Discharge status: Home / Self Care | Payer: Self-pay | Source: Home / Self Care | Attending: Internal Medicine

## 2014-07-08 LAB — HEPARIN LEVEL (UNFRACTIONATED): Heparin Unfractionated: 0.75 IU/mL — ABNORMAL HIGH (ref 0.30–0.70)

## 2014-07-08 SURGERY — ECHOCARDIOGRAM, TRANSESOPHAGEAL
Anesthesia: Monitor Anesthesia Care

## 2014-07-08 MED ORDER — LISINOPRIL 2.5 MG PO TABS: 2.5000 mg | ORAL_TABLET | Freq: Every day | ORAL | Status: DC

## 2014-07-08 MED ORDER — APIXABAN 2.5 MG PO TABS
5.0000 mg | ORAL_TABLET | Freq: Two times a day (BID) | ORAL | Status: DC
  Administered 2014-07-08: 5 mg via ORAL
  Filled 2014-07-08: qty 2

## 2014-07-08 MED ORDER — PREDNISONE 10 MG PO TABS: 10.0000 mg | ORAL_TABLET | Freq: Every day | ORAL | Status: DC

## 2014-07-08 MED ORDER — ALBUTEROL SULFATE HFA 108 (90 BASE) MCG/ACT IN AERS: 2.0000 | INHALATION_SPRAY | Freq: Four times a day (QID) | RESPIRATORY_TRACT | Status: AC | PRN

## 2014-07-08 MED ORDER — LISINOPRIL 2.5 MG PO TABS
2.5000 mg | ORAL_TABLET | Freq: Every day | ORAL | Status: DC
  Administered 2014-07-08: 2.5 mg via ORAL
  Filled 2014-07-08: qty 1

## 2014-07-08 MED ORDER — SIMVASTATIN 20 MG PO TABS
20.0000 mg | ORAL_TABLET | Freq: Every day | ORAL | Status: DC
  Administered 2014-07-08: 20 mg via ORAL
  Filled 2014-07-08: qty 1

## 2014-07-08 MED ORDER — METOPROLOL TARTRATE 50 MG PO TABS
50.0000 mg | ORAL_TABLET | Freq: Two times a day (BID) | ORAL | Status: DC
  Administered 2014-07-08: 50 mg via ORAL
  Filled 2014-07-08: qty 1

## 2014-07-08 MED ORDER — APIXABAN 5 MG PO TABS: 5.0000 mg | ORAL_TABLET | Freq: Two times a day (BID) | ORAL | Status: DC

## 2014-07-08 MED ORDER — METOPROLOL TARTRATE 50 MG PO TABS: 50.0000 mg | ORAL_TABLET | Freq: Two times a day (BID) | ORAL | Status: DC

## 2014-07-08 MED ORDER — DIGOXIN 250 MCG PO TABS: 0.2500 mg | ORAL_TABLET | Freq: Every day | ORAL | Status: DC

## 2014-07-08 NOTE — Care Management Note (Signed)
Case Management Note  Patient Details  Name: [name] MRN: [id number] Date of Birth: [birth year]  Subjective/Objective:                    Action/Plan:   Expected Discharge Date:  07/07/14               Expected Discharge Plan:  Home/Self Care  In-House Referral:  NA  Discharge planning Services  CM Consult, Indigent Health Clinic, Medication Assistance  Post Acute Care Choice:  NA Choice offered to:  NA  DME Arranged:    DME Agency:     HH Arranged:    HH Agency:     Status of Service:  In process, will continue to follow  Medicare Important Message Given:    Date Medicare IM Given:    Medicare IM give by:    Date Additional Medicare IM Given:    Additional Medicare Important Message give by:     If discussed at Long Length of Stay Meetings, dates discussed:    Additional Comments: Appointment for [facility], June 16 at 10 AM given to pt. Eliquis 30 day supply given to pt.  Encouraged pt to call toll free number for Eliquis to apply for indigent program. Pt works and can afford other medications on $4.00 list.   Geni Bers, RN 07/08/2014, 12:40 PM

## 2014-07-08 NOTE — Progress Notes (Signed)
PROGRESS NOTE  [name] ZOX:[id number] DOB: [birth year] DOA: 07/02/2014 PCP: No PCP Per Patient  Summary: 49 year old man with history of atrial fibrillation, cardioversion, alcoholism in remission, not taking any medications, presented with chest pain, shortness of breath, right upper quadrant pain with positive Murphy sign, right upper quadrant ultrasound concerning for acute cholecystitis. He was admitted for acute hypoxic respiratory failure, atrial fibrillation with rapid ventricular response, acute heart failure, atypical chest pain, right upper quadrant pain. He was seen by cardiology as well as general surgery and interventional radiology. In regard to right upper quadrant pain, recommendations are for outpatient follow-up with general surgery. Percutaneous cholecystostomy was not felt to be indicated.  Assessment/Plan: 1. Acute systolic congestive heart failure, likely related to previous alcohol use with profound systolic dysfunction, LVEF 15%. Appears well-compensated. Biventricular heart failure. Likely alcohol induced cardiomyopathy versus tachycardia-induced. ACE inhibitor, metoprolol, digoxin as per cardiology.  2. Severe mitral valve regurgitation. Management per cardiology. Appears euvolemic. 3. Atrial fibrillation with rapid ventricular response. Remains rate-controlled. Patient refused cardioversion 6/7. CHADs2vasc = 2 (CHF, HTN). Change to oral anticoagulant.  4. Acute hypoxic respiratory failure. Resolved.  5. Atypical chest pain. Felt to be secondary to COPD exacerbation. Flat, mildly elevated troponins, no regional wall motion abnormalities, no evidence of acute ischemia. Continue aspirin, beta blocker. No statin secondary to transaminitis. 6. Acute COPD exacerbation. Resolved. Continue bronchodilators, budesonide, short course of prednisone. Follow-up as an outpatient. 7. Possible acute cholecystitis, right upper quadrant pain. Suspect chronic cholecystitis. HIDA negative.  percutaneous cholecystostomy was considered but interventional radiology recommended against given results of HIDA scan.  No clinical evidence of acute cholecystitis. No indication for urgent cholecystectomy per general surgery. Not a good candidate for surgery because of heart failure. Can see Dr. Michaell Cowing after discharge to discuss elective cholecystectomy for possible chronic cholecystitis. 8. Transaminitis.  Acute hepatitis panel negative. Suspect alcoholic hepatitis versus passive congestion from heart failure. Trending down. 9. Acute kidney injury. Resolved. Secondary to dehydration.  10. Alcohol abuse, none in a couple of weeks. No evidence of withdrawal. 11. Tobacco dependence in remission    Appreciate cardiology management of acute systolic congestive heart failure, atrial fibrillation, severe mitral valve regurgitation, atypical chest pain. Appears stable.  Case management consult. Case was discussed with Dr. Sharyn Lull recommends Eliquis. He states the patient has non-valvular atrial fibrillation.  Wean prednisone  Outpatient follow-up with Dr. gross for consideration of elective cholecystectomy for possible chronic cholecystitis  Repeat complete blood panel to follow-up on transaminitis as an outpatient  Continue to encourage alcohol abstinence on and tobacco abstinence  Anticipate discharge within 24 hours. Euvolemic without diuretics.  Code Status: full code DVT prophylaxis: Eliquis Family Communication: none present, pt alert, understands plan Disposition Plan: Lives with uncle.  Robert Sacks, MD  [facility]  Pager [phone] If 7PM-7AM, please contact night-coverage at [email or web address], password [facility] 07/08/2014, 8:17 AM  LOS: 6 days   Consultants:  Cardiology  General surgery  Interventional radiology  Procedures:  Chest x-ray  Right upper quadrant ultrasound  Echocardiogram Study Conclusions  - Left ventricle: The cavity size was mildly to moderately  dilated. Systolic function was severely reduced. Wall motion was normal; there were no regional wall motion abnormalities. - Mitral valve: There was severe regurgitation directed eccentrically and posteriorly. - Left atrium: The atrium was severely dilated. - Right ventricle: The cavity size was dilated. Wall thickness was normal. Systolic function was reduced. - Right atrium: The atrium was moderately to severely dilated. - Pulmonary arteries: Systolic pressure was  mildly to moderately increased. PA peak pressure: 40 mm Hg (S).  Antibiotics:  Zosyn 6/2  HPI/Subjective: Doing well, eating well. Hungry. No abdominal pain. No chest pain. In general, breathing well. He has decided against DCCV.  Objective: Filed Vitals:   07/07/14 2020 07/07/14 2023 07/07/14 2202 07/08/14 0548  BP:   114/75 126/89  Pulse:   62 87  Temp:   97.5 F (36.4 C) 98.2 F (36.8 C)  TempSrc:   Oral Oral  Resp:   20 20  Height:      Weight:    79.47 kg (175 lb 3.2 oz)  SpO2: 95% 96% 95% 99%    Intake/Output Summary (Last 24 hours) at 07/08/14 0817 Last data filed at 07/08/14 0547  Gross per 24 hour  Intake    480 ml  Output   2400 ml  Net  -1920 ml     Filed Weights   07/06/14 0700 07/07/14 0425 07/08/14 0548  Weight: 83.1 kg (183 lb 3.2 oz) 79.379 kg (175 lb) 79.47 kg (175 lb 3.2 oz)    Exam:    Afebrile, VSS, no hypoxia General:  Appears calm and comfortable Cardiovascular: irregular, normal rate, no m/r/g. No LE edema. Telemetry: afib, rate controlled Respiratory: CTA bilaterally, no w/r/r. Normal respiratory effort. Abdomen: soft, ntnd, no RUQ pain Skin: no rash or induration seen on limited exam Psychiatric: grossly normal mood and affect, speech fluent and appropriate  New data reviewed:  UOP 2400, weight stable   Pertinent data since admission  troponins flat   TSH, T3, T4 unremarkable   CBC unremarkable  CXR no acute disease  HIDA unremarkable  Pending  data:    Scheduled Meds: . antiseptic oral rinse  7 mL Mouth Rinse BID  . aspirin EC  325 mg Oral Daily  . budesonide (PULMICORT) nebulizer solution  0.25 mg Nebulization BID  . digoxin  0.25 mg Oral Daily  . diltiazem  240 mg Oral Daily  . levalbuterol  0.63 mg Nebulization TID  . metoprolol tartrate  25 mg Oral BID  . predniSONE  20 mg Oral Q breakfast  . sodium chloride  3 mL Intravenous Q12H   Continuous Infusions: . heparin 1,500 Units/hr (07/08/14 0729)    Principal Problem:   Atrial fibrillation with RVR Active Problems:   Acute respiratory failure with hypoxia   Atrial flutter with rapid ventricular response   Chest pain   Right upper quadrant pain   COPD exacerbation   Mitral valve regurgitation   Acute systolic congestive heart failure   Time spent 25 minutes

## 2014-07-08 NOTE — Progress Notes (Addendum)
ANTICOAGULATION CONSULT NOTE - Follow Up Consult  Pharmacy Consult for Heparin Indication: atrial fibrillation  Allergies  Allergen Reactions  . Bee Venom Anaphylaxis    Uses epi pen-- last used 08/23/11    Patient Measurements: Height: 5\' 6"  (167.6 cm) Weight: 175 lb 3.2 oz (79.47 kg) IBW/kg (Calculated) : 63.8 Heparin Dosing Weight: actual weight  Vital Signs: Temp: 98.2 F (36.8 C) (06/07 0548) Temp Source: Oral (06/07 0548) BP: 126/89 mmHg (06/07 0548) Pulse Rate: 87 (06/07 0548)  Labs:  Recent Labs  07/06/14 0408  07/07/14 0440 07/07/14 1253 07/07/14 2100 07/08/14 0434  HGB 14.0  --  14.6  --   --   --   HCT 42.2  --  44.0  --   --   --   PLT 185  --  182  --   --   --   HEPARINUNFRC  --   < > 0.57 0.84* 0.59 0.75*  CREATININE 0.82  --  0.75  --   --   --   < > = values in this interval not displayed.  Estimated Creatinine Clearance: 110.7 mL/min (by C-G formula based on Cr of 0.75).   Medications:  Infusions:  . heparin 1,500 Units/hr (07/08/14 0729)    Assessment: 21 yoM with admitted with chest pain and SOB, wheezing, cough, and RUQ pain.  PMH includes AFibs/p cardioversion, HTN, homelessness, alcoholism in remission, tobacco abuse.  HIDA scan negative.  Found to have acute systolic CHF, severe mitral valve regurgitation, and AFib likely d/t CHF.  Pharmacy is consulted to dose Heparin for AFib.  Significant Events: 6/6: During morning rounds RN informed pharmacy that pt had been observed messing around with heparin pump 6/7: patient now refusing to have cardioversion  Today 6/7  CBC: Hgb and Plt remain stable and WNL  SCr  0.75, CrCl >100 ml/min  AST/ALT remain elevated but improved  HL 0.75-supratherapeutic  No bleeding or complications reported per RN  Goal of Therapy:  Heparin level 0.3-0.7 units/ml Monitor platelets by anticoagulation protocol: Yes   Plan:   Decrease heparin to 1500 Units/hour  Recheck heparin level in 6  hours  Daily heparin level and CBC  Continue to monitor H&H and platelets  F/u anticoagulation plans  Arley Phenix RPh 07/08/2014, 8:39 AM Pager [phone]  Addendum: Pharmacy consulted to dose apixaban for Nonvalvular Atrial Fibrillation  Plan: -stop heparin drip and daily levels -start apixaban 5mg  po twice daily -pharmacy to provide education  Arley Phenix RPh 07/08/2014, 8:54 AM Pager [phone]

## 2014-07-08 NOTE — Progress Notes (Signed)
Subjective:  Patient denies any chest pain or shortness of breath either to go home refuse for TEE cardioversion now wanted to be treated medically  Objective:  Vital Signs in the last 24 hours: Temp:  [97.5 F (36.4 C)-98.2 F (36.8 C)] 98.2 F (36.8 C) (06/07 0548) Pulse Rate:  [62-97] 87 (06/07 0548) Resp:  [19-20] 20 (06/07 0548) BP: (114-126)/(74-89) 126/89 mmHg (06/07 0548) SpO2:  [95 %-99 %] 99 % (06/07 0548) Weight:  [79.47 kg (175 lb 3.2 oz)] 79.47 kg (175 lb 3.2 oz) (06/07 0548)  Intake/Output from previous day: 06/06 0701 - 06/07 0700 In: 840 [P.O.:840] Out: 2400 [Urine:2400] Intake/Output from this shift:    Physical Exam: Neck: no adenopathy, no carotid bruit, no JVD and supple, symmetrical, trachea midline Lungs: clear to auscultation bilaterally Heart: impulse: apical impulse displaced Fifth intercostal space, irregularly irregular rhythm and Soft systolic murmur noted Abdomen: soft, non-tender; bowel sounds normal; no masses,  no organomegaly Extremities: extremities normal, atraumatic, no cyanosis or edema  Lab Results:  Recent Labs  07/06/14 0408 07/07/14 0440  WBC 8.7 9.7  HGB 14.0 14.6  PLT 185 182    Recent Labs  07/06/14 0408 07/07/14 0440  NA 139 139  K 4.0 4.2  CL 108 102  CO2 23 26  GLUCOSE 114* 91  BUN 14 15  CREATININE 0.82 0.75   No results for input(s): TROPONINI in the last 72 hours.  Invalid input(s): CK, MB Hepatic Function Panel  Recent Labs  07/06/14 0408  PROT 5.6*  ALBUMIN 2.9*  AST 88*  ALT 172*  ALKPHOS 73  BILITOT 0.4   No results for input(s): CHOL in the last 72 hours. No results for input(s): PROTIME in the last 72 hours.  Imaging: Imaging results have been revieplan wPlaned and No results found.  Cardiac Studies:  Assessment/Plan:  chronicA. fib flutter with controlled ventricular response now. Severe dilated cardiomyopathy probably secondary to EtOH/tachycardia induced Atypical chest  pain Possible acute cholecystitis Hypertension EtOH abuse Tobacco abuse Depression Anxiety disorder  Plan Switch heparin to eliquis 5 mg twice daily DC Cardizem in view of depressed LV systolic function Increase Lopressor to 50 mg twice daily Add Ace inhibitors Okay to discharge from cardiac point of view Follow-up with me in one week Will attempt chemical cardioversion in 3 weeks as outpatient. Will increase ACE inhibitor  and beta blockers as tolerated as outpatient.   LOS: 6 days    Rinaldo Cloud 07/08/2014, 9:00 AM

## 2014-07-08 NOTE — Discharge Summary (Signed)
Physician Discharge Summary  Quinterius Gaida WJX:[id number] DOB: Jan 13, 1966 DOA: 07/02/2014  PCP: No PCP Per Patient  Admit date: 07/02/2014 Discharge date: 07/08/2014  Recommendations for Outpatient Follow-up:  1. Systolic heart failure 2. Atrial fibrillation, started on Eliquis 3. Continue to encourage alcohol and tobacco abstinence 4. Follow up possible chronic cholecystitis 5. Transaminitis, likely alcohol related, could consider statin therapy depending on trend    Follow-up Information    Follow up with [facility]     On 07/17/2014.   Why:  appointment at 10 AM, please take photo ID, may need co pay of $20.00. Arrive 15 mins early.   Contact information:   [address] [place] [address] [phone]      Follow up with Rinaldo Cloud, MD.   Specialty:  Cardiology   Why:  call office for appointment in 1 week.   Contact information:   [address] [place] [address] [phone]       Follow up with Ardeth Sportsman., MD. Schedule an appointment as soon as possible for a visit in 4 weeks.   Specialty:  General Surgery   Contact information:   [address] [place] [address] [phone]      Discharge Diagnoses:  1. Acute systolic congestive heart failure 2. Atrial fibrillation with rapid ventricular response 3. Acute hypoxic respiratory failure 4. Atypical chest pain 5. Severe mitral valve regurgitation 6. Acute COPD exacerbation 7. Possible acute cholecystitis, possible chronic cholecystitis 8. Transaminitis 9. Acute kidney injury 10. Alcohol abuse 11. Tobacco dependence in remission  Discharge Condition: improved Disposition: home  Diet recommendation: heart healthy  Filed Weights   07/06/14 0700 07/07/14 0425 07/08/14 0548  Weight: 83.1 kg (183 lb 3.2 oz) 79.379 kg (175 lb) 79.47 kg (175 lb 3.2 oz)    History of present illness:  49 year old man with history of  atrial fibrillation, cardioversion, alcoholism in remission, not taking any medications, presented with chest pain, shortness of breath, right upper quadrant pain with positive Murphy sign, right upper quadrant ultrasound concerning for acute cholecystitis. He was admitted for acute hypoxic respiratory failure, atrial fibrillation with rapid ventricular response, acute heart failure, atypical chest pain, right upper quadrant pain.   Hospital Course:  He was admitted for acute hypoxic respiratory failure, atrial fibrillation with rapid ventricular response, acute heart failure, atypical chest pain, right upper quadrant pain. He was seen by cardiology as well as general surgery and interventional radiology. He was treated with cardiac medications and rate control but did not require significant diuresis, receiving only 2 doses of Lasix over his 6 day hospitalization, he has been able to remain euvolemic without dieretic therapy. Cardiology recommended cardioversion which the patient considered but eventually decided against. In regard to right upper quadrant pain, recommendations are for outpatient follow-up with general surgery.,No further intervention as an outpatient Percutaneous cholecystostomy was not felt to be indicated. Individual issues as below.  1. Acute systolic congestive heart failure, likely related to previous alcohol use with profound systolic dysfunction, LVEF 15%. Appears well-compensated. Biventricular heart failure. Likely alcohol induced cardiomyopathy versus tachycardia-induced. ACE inhibitor, metoprolol, digoxin as per cardiology.  2. Severe mitral valve regurgitation. Management per cardiology. Appears euvolemic. 3. Atrial fibrillation with rapid ventricular response. Remains rate-controlled. Patient refused cardioversion 6/7. CHADs2vasc = 2 (CHF, HTN). Changed to oral anticoagulant.  4. Acute hypoxic respiratory failure. Resolved.  5. Atypical chest pain. Felt to be secondary to  COPD exacerbation. Flat, mildly  elevated troponins, no regional wall motion abnormalities, no evidence of acute ischemia. Continue aspirin, beta blocker. No statin secondary to transaminitis. 6. Acute COPD exacerbation. Resolved. Continue bronchodilators, short course of prednisone. Follow-up as an outpatient. 7. Possible acute cholecystitis, right upper quadrant pain. Suspect chronic cholecystitis. HIDA negative. percutaneous cholecystostomy was considered but interventional radiology recommended against given results of HIDA scan. No clinical evidence of acute cholecystitis. No indication for urgent cholecystectomy per general surgery. Not a good candidate for surgery because of heart failure. Can see Dr. Michaell Cowing after discharge to discuss elective cholecystectomy for possible chronic cholecystitis. 8. Transaminitis. Acute hepatitis panel negative. Suspect alcoholic hepatitis versus passive congestion from heart failure. Trending down. 9. Acute kidney injury. Resolved. Secondary to dehydration.  10. Alcohol abuse, none in a couple of weeks. No evidence of withdrawal. 11. Tobacco dependence in remission    Appreciate cardiology management of acute systolic congestive heart failure, atrial fibrillation, severe mitral valve regurgitation, atypical chest pain. Appears stable.  Case was discussed with Dr. Sharyn Lull recommends Eliquis. He states the patient has non-valvular atrial fibrillation.  Wean prednisone  Outpatient follow-up with Dr. Michaell Cowing for consideration of elective cholecystectomy for possible chronic cholecystitis  Repeat complete metabolic panel to follow-up on transaminitis as an outpatient  Continue to encourage alcohol abstinenc and tobacco abstinence  Consultants:  Cardiology  General surgery  Interventional radiology  Procedures:  Chest x-ray  Right upper quadrant ultrasound  Echocardiogram Study Conclusions  - Left ventricle: The cavity size was mildly to  moderately dilated. Systolic function was severely reduced. Wall motion was normal; there were no regional wall motion abnormalities. - Mitral valve: There was severe regurgitation directed eccentrically and posteriorly. - Left atrium: The atrium was severely dilated. - Right ventricle: The cavity size was dilated. Wall thickness was normal. Systolic function was reduced. - Right atrium: The atrium was moderately to severely dilated. - Pulmonary arteries: Systolic pressure was mildly to moderately increased. PA peak pressure: 40 mm Hg (S).  Antibiotics:    Discharge Instructions  Discharge Instructions    (HEART FAILURE PATIENTS) Call MD:  Anytime you have any of the following symptoms: 1) 3 pound weight gain in 24 hours or 5 pounds in 1 week 2) shortness of breath, with or without a dry hacking cough 3) swelling in the hands, feet or stomach 4) if you have to sleep on extra pillows at night in order to breathe.    Complete by:  As directed      Diet - low sodium heart healthy    Complete by:  As directed      Discharge instructions    Complete by:  As directed   Call your physician or seek immediate medical attention for pain, swelling, weight gain, shortness of breath or worsening of condition.     Increase activity slowly    Complete by:  As directed           Current Discharge Medication List    START taking these medications   Details  albuterol (PROVENTIL HFA;VENTOLIN HFA) 108 (90 BASE) MCG/ACT inhaler Inhale 2 puffs into the lungs every 6 (six) hours as needed for wheezing or shortness of breath. Qty: 1 Inhaler, Refills: 0    apixaban (ELIQUIS) 5 MG TABS tablet Take 1 tablet (5 mg total) by mouth 2 (two) times daily. Qty: 60 tablet, Refills: 0    digoxin (LANOXIN) 0.25 MG tablet Take 1 tablet (0.25 mg total) by mouth daily. Qty: 30 tablet, Refills: 0  lisinopril (PRINIVIL,ZESTRIL) 2.5 MG tablet Take 1 tablet (2.5 mg total) by mouth daily. Qty: 30  tablet, Refills: 0    metoprolol (LOPRESSOR) 50 MG tablet Take 1 tablet (50 mg total) by mouth 2 (two) times daily. Qty: 60 tablet, Refills: 0    predniSONE (DELTASONE) 10 MG tablet Take 1 tablet (10 mg total) by mouth daily with breakfast. Qty: 4 tablet, Refills: 0      STOP taking these medications     aspirin 325 MG tablet        Allergies  Allergen Reactions  . Bee Venom Anaphylaxis    Uses epi pen-- last used 08/23/11    The results of significant diagnostics from this hospitalization (including imaging, microbiology, ancillary and laboratory) are listed below for reference.    Significant Diagnostic Studies: Dg Chest 2 View  07/02/2014   CLINICAL DATA:  Shortness of breath, weakness, nonproductive cough for 4 days, increasing.  EXAM: CHEST  2 VIEW  COMPARISON:  06/23/2013  FINDINGS: Cardiomegaly is unchanged. Peribronchial and interstitial opacities are unchanged, may reflect pulmonary edema, however appears chronic. The previous right lower lobe consolidation has diminished, however minimal residual opacities persist. No new consolidation. There may be small pleural effusions. No acute osseous abnormalities.  IMPRESSION: 1. Right basilar atelectasis or scarring. 2. Stable cardiomegaly. Interstitial opacities concerning for pulmonary edema, however this may be chronic and is unchanged compared to prior.   Electronically Signed   By: Rubye Oaks M.D.   On: 07/02/2014 22:32   Nm Hepatobiliary Liver Func  07/03/2014   CLINICAL DATA:  Gallbladder wall thickening on ultrasound. No gallstones.  EXAM: NUCLEAR MEDICINE HEPATOBILIARY IMAGING  TECHNIQUE: Sequential images of the abdomen were obtained out to 60 minutes following intravenous administration of radiopharmaceutical.  RADIOPHARMACEUTICALS:  5.1 Tc-39m Choletec IV  COMPARISON:  None.  FINDINGS: There is prompt uptake and excretion of the radiopharmaceutical by the liver. There is prompt and increasing activity in the gallbladder.  There is activity in central extrahepatic bile ducts and small bowel before 1 hour.  IMPRESSION: 1. Patency of cystic and common bile ducts.   Electronically Signed   By: Corlis Leak M.D.   On: 07/03/2014 16:11   US Abdomen Limited Ruq  07/03/2014   CLINICAL DATA:  Acute onset of right upper quadrant abdominal pain. Initial encounter.  EXAM: US ABDOMEN LIMITED - RIGHT UPPER QUADRANT  COMPARISON:  Right upper quadrant ultrasound performed 06/21/2013  FINDINGS: Gallbladder:  Diffuse gallbladder wall thickening is seen, with the gallbladder wall measuring up to 1.2 cm in thickness. No definite stones are characterized. However, a positive ultrasonographic Murphy's sign is elicited. Findings are concerning for acute cholecystitis.  Common bile duct:  Diameter: 0.3 cm, within normal limits in caliber.  Liver:  No focal lesion identified. Within normal limits in parenchymal echogenicity.  A small right pleural effusion is noted.  IMPRESSION: 1. Diffuse gallbladder wall thickening noted, with a positive ultrasonographic Murphy's sign. Gallbladder wall thickening is worsened from the prior study. No definite stones seen. Findings are concerning for acute cholecystitis; this could reflect acalculous cholecystitis. 2. Small right pleural effusion noted.   Electronically Signed   By: Roanna Raider M.D.   On: 07/03/2014 01:28    Microbiology: Recent Results (from the past 240 hour(s))  MRSA PCR Screening     Status: None   Collection Time: 07/03/14  3:53 AM  Result Value Ref Range Status   MRSA by PCR NEGATIVE NEGATIVE Final    Comment:  The GeneXpert MRSA Assay (FDA approved for NASAL specimens only), is one component of a comprehensive MRSA colonization surveillance program. It is not intended to diagnose MRSA infection nor to guide or monitor treatment for MRSA infections.      Labs: Basic Metabolic Panel:  Recent Labs Lab 07/02/14 2146 07/02/14 2205 07/04/14 0347 07/05/14 0346  07/06/14 0408 07/07/14 0440  NA  --  142 138 138 139 139  K  --  4.2 4.1 3.9 4.0 4.2  CL  --  108 107 108 108 102  CO2  --   --  23 23 23 26   GLUCOSE  --  91 91 124* 114* 91  BUN  --  27* 25* 20 14 15   CREATININE  --  1.10 1.39* 1.07 0.82 0.75  CALCIUM  --   --  8.6* 8.5* 8.6* 9.2  MG 2.0  --   --   --   --   --    Liver Function Tests:  Recent Labs Lab 07/03/14 0013 07/04/14 0347 07/05/14 0346 07/06/14 0408  AST 109* 324* 169* 88*  ALT 111* 275* 228* 172*  ALKPHOS 83 73 72 73  BILITOT 0.7 0.9 0.5 0.4  PROT 6.2* 5.8* 5.6* 5.6*  ALBUMIN 3.4* 3.2* 3.0* 2.9*    Recent Labs Lab 07/03/14 0013  LIPASE 16*   CBC:  Recent Labs Lab 07/02/14 2146  07/03/14 1635 07/04/14 0347 07/05/14 0346 07/06/14 0408 07/07/14 0440  WBC 7.6  --  7.0 6.2 6.1 8.7 9.7  NEUTROABS 3.6  --   --   --   --   --  7.3  HGB 14.9  < > 14.7 13.8 13.8 14.0 14.6  HCT 45.1  < > 44.2 42.1 42.5 42.2 44.0  MCV 97.0  --  98.9 98.1 99.8 98.4 97.3  PLT 176  --  166 180 175 185 182  < > = values in this interval not displayed. Cardiac Enzymes:  Recent Labs Lab 07/03/14 0033 07/03/14 0243 07/03/14 0910 07/03/14 1505  TROPONINI 0.04* 0.04* 0.03 0.03       Recent Labs  07/02/14 2348  BNP 873.2*      Principal Problem:   Atrial fibrillation with RVR Active Problems:   Acute respiratory failure with hypoxia   Atrial flutter with rapid ventricular response   Chest pain   Right upper quadrant pain   COPD exacerbation   Mitral valve regurgitation   Acute systolic congestive heart failure   Time coordinating discharge: 35 minutes  Signed:  Brendia Sacks, MD [facility] 07/08/2014, 3:07 PM

## 2014-07-08 NOTE — Progress Notes (Signed)
Pt is refusing to have Cardioversion. States he will wait until 6- 8 weeks before he proceeds the the procedure. SRP, RN

## 2014-07-17 ENCOUNTER — Inpatient Hospital Stay: Payer: Self-pay | Admitting: Internal Medicine

## 2014-07-30 ENCOUNTER — Emergency Department (HOSPITAL_COMMUNITY): Payer: Self-pay

## 2014-07-30 ENCOUNTER — Inpatient Hospital Stay (HOSPITAL_COMMUNITY)
Admission: EM | Admit: 2014-07-30 | Discharge: 2014-08-03 | DRG: 308 | Disposition: A | Discharge status: Home / Self Care | Payer: Self-pay | Attending: Internal Medicine | Admitting: Internal Medicine

## 2014-07-30 ENCOUNTER — Encounter (HOSPITAL_COMMUNITY): Payer: Self-pay

## 2014-07-30 DIAGNOSIS — I240 Acute coronary thrombosis not resulting in myocardial infarction: Secondary | ICD-10-CM | POA: Diagnosis present

## 2014-07-30 DIAGNOSIS — Z7952 Long term (current) use of systemic steroids: Secondary | ICD-10-CM

## 2014-07-30 DIAGNOSIS — T50996A Underdosing of other drugs, medicaments and biological substances, initial encounter: Secondary | ICD-10-CM | POA: Diagnosis present

## 2014-07-30 DIAGNOSIS — I959 Hypotension, unspecified: Secondary | ICD-10-CM | POA: Diagnosis present

## 2014-07-30 DIAGNOSIS — I5023 Acute on chronic systolic (congestive) heart failure: Secondary | ICD-10-CM | POA: Diagnosis present

## 2014-07-30 DIAGNOSIS — F329 Major depressive disorder, single episode, unspecified: Secondary | ICD-10-CM | POA: Diagnosis present

## 2014-07-30 DIAGNOSIS — Z9119 Patient's noncompliance with other medical treatment and regimen: Secondary | ICD-10-CM | POA: Diagnosis present

## 2014-07-30 DIAGNOSIS — F419 Anxiety disorder, unspecified: Secondary | ICD-10-CM | POA: Diagnosis present

## 2014-07-30 DIAGNOSIS — I10 Essential (primary) hypertension: Secondary | ICD-10-CM | POA: Diagnosis present

## 2014-07-30 DIAGNOSIS — F1020 Alcohol dependence, uncomplicated: Secondary | ICD-10-CM | POA: Diagnosis present

## 2014-07-30 DIAGNOSIS — Y92019 Unspecified place in single-family (private) house as the place of occurrence of the external cause: Secondary | ICD-10-CM

## 2014-07-30 DIAGNOSIS — R29898 Other symptoms and signs involving the musculoskeletal system: Secondary | ICD-10-CM

## 2014-07-30 DIAGNOSIS — Z8249 Family history of ischemic heart disease and other diseases of the circulatory system: Secondary | ICD-10-CM

## 2014-07-30 DIAGNOSIS — F32A Depression, unspecified: Secondary | ICD-10-CM | POA: Diagnosis present

## 2014-07-30 DIAGNOSIS — Z91030 Bee allergy status: Secondary | ICD-10-CM

## 2014-07-30 DIAGNOSIS — Z79899 Other long term (current) drug therapy: Secondary | ICD-10-CM

## 2014-07-30 DIAGNOSIS — I420 Dilated cardiomyopathy: Secondary | ICD-10-CM | POA: Diagnosis present

## 2014-07-30 DIAGNOSIS — Z87891 Personal history of nicotine dependence: Secondary | ICD-10-CM

## 2014-07-30 DIAGNOSIS — IMO0001 Reserved for inherently not codable concepts without codable children: Secondary | ICD-10-CM | POA: Diagnosis present

## 2014-07-30 DIAGNOSIS — Z7902 Long term (current) use of antithrombotics/antiplatelets: Secondary | ICD-10-CM

## 2014-07-30 DIAGNOSIS — R079 Chest pain, unspecified: Secondary | ICD-10-CM | POA: Diagnosis present

## 2014-07-30 DIAGNOSIS — F418 Other specified anxiety disorders: Secondary | ICD-10-CM

## 2014-07-30 DIAGNOSIS — Z91128 Patient's intentional underdosing of medication regimen for other reason: Secondary | ICD-10-CM | POA: Diagnosis present

## 2014-07-30 DIAGNOSIS — I4891 Unspecified atrial fibrillation: Principal | ICD-10-CM | POA: Diagnosis present

## 2014-07-30 LAB — HEPATIC FUNCTION PANEL
ALK PHOS: 88 U/L (ref 38–126)
ALT: 130 U/L — AB (ref 17–63)
AST: 176 U/L — ABNORMAL HIGH (ref 15–41)
Albumin: 3.1 g/dL — ABNORMAL LOW (ref 3.5–5.0)
Bilirubin, Direct: 0.3 mg/dL (ref 0.1–0.5)
Indirect Bilirubin: 0.7 mg/dL (ref 0.3–0.9)
Total Bilirubin: 1 mg/dL (ref 0.3–1.2)
Total Protein: 5.6 g/dL — ABNORMAL LOW (ref 6.5–8.1)

## 2014-07-30 LAB — CBC
HEMATOCRIT: 44 % (ref 39.0–52.0)
HEMOGLOBIN: 15 g/dL (ref 13.0–17.0)
MCH: 32.4 pg (ref 26.0–34.0)
MCHC: 34.1 g/dL (ref 30.0–36.0)
MCV: 95 fL (ref 78.0–100.0)
Platelets: 152 10*3/uL (ref 150–400)
RBC: 4.63 MIL/uL (ref 4.22–5.81)
RDW: 13.4 % (ref 11.5–15.5)
WBC: 5.9 10*3/uL (ref 4.0–10.5)

## 2014-07-30 LAB — DIGOXIN LEVEL: DIGOXIN LVL: 0.7 ng/mL — AB (ref 0.8–2.0)

## 2014-07-30 LAB — BASIC METABOLIC PANEL
Anion gap: 11 (ref 5–15)
BUN: 17 mg/dL (ref 6–20)
CO2: 19 mmol/L — AB (ref 22–32)
CREATININE: 1.21 mg/dL (ref 0.61–1.24)
Calcium: 8.6 mg/dL — ABNORMAL LOW (ref 8.9–10.3)
Chloride: 105 mmol/L (ref 101–111)
GFR calc Af Amer: >60 mL/min (ref >60)
GFR calc non Af Amer: >60 mL/min (ref >60)
Glucose, Bld: 98 mg/dL (ref 65–99)
POTASSIUM: 5.2 mmol/L — AB (ref 3.5–5.1)
Sodium: 135 mmol/L (ref 135–145)

## 2014-07-30 LAB — I-STAT TROPONIN, ED: Troponin i, poc: 0.01 ng/mL (ref 0.00–0.08)

## 2014-07-30 LAB — ETHANOL: Alcohol, Ethyl (B): <5 mg/dL (ref <5)

## 2014-07-30 LAB — PROTIME-INR
INR: 1.38 (ref 0.00–1.49)
Prothrombin Time: 17.1 seconds — ABNORMAL HIGH (ref 11.6–15.2)

## 2014-07-30 LAB — MRSA PCR SCREENING: MRSA by PCR: NEGATIVE

## 2014-07-30 LAB — TROPONIN I: TROPONIN I: 0.03 ng/mL (ref <0.031)

## 2014-07-30 LAB — LIPASE, BLOOD: Lipase: 17 U/L — ABNORMAL LOW (ref 22–51)

## 2014-07-30 LAB — BRAIN NATRIURETIC PEPTIDE: B NATRIURETIC PEPTIDE 5: 780.5 pg/mL — AB (ref 0.0–100.0)

## 2014-07-30 MED ORDER — ACETAMINOPHEN 325 MG PO TABS: 650.0000 mg | ORAL_TABLET | Freq: Four times a day (QID) | ORAL | Status: DC | PRN

## 2014-07-30 MED ORDER — ONDANSETRON HCL 4 MG PO TABS: 4.0000 mg | ORAL_TABLET | Freq: Four times a day (QID) | ORAL | Status: DC | PRN

## 2014-07-30 MED ORDER — IPRATROPIUM BROMIDE 0.02 % IN SOLN: 0.5000 mg | Freq: Four times a day (QID) | RESPIRATORY_TRACT | Status: DC | PRN

## 2014-07-30 MED ORDER — SODIUM CHLORIDE 0.9 % IV BOLUS (SEPSIS)
250.0000 mL | Freq: Once | INTRAVENOUS | Status: AC
  Administered 2014-07-30: 250 mL via INTRAVENOUS

## 2014-07-30 MED ORDER — SODIUM CHLORIDE 0.9 % IJ SOLN
3.0000 mL | Freq: Two times a day (BID) | INTRAMUSCULAR | Status: DC
  Administered 2014-07-30 – 2014-08-02 (×5): 3 mL via INTRAVENOUS

## 2014-07-30 MED ORDER — DIGOXIN 250 MCG PO TABS
0.2500 mg | ORAL_TABLET | Freq: Every day | ORAL | Status: DC
  Administered 2014-07-31 – 2014-08-03 (×4): 0.25 mg via ORAL
  Filled 2014-07-30 (×4): qty 1

## 2014-07-30 MED ORDER — METOPROLOL TARTRATE 25 MG PO TABS
25.0000 mg | ORAL_TABLET | Freq: Two times a day (BID) | ORAL | Status: DC
  Administered 2014-07-30 – 2014-08-02 (×6): 25 mg via ORAL
  Filled 2014-07-30 (×5): qty 1

## 2014-07-30 MED ORDER — PREDNISONE 10 MG PO TABS
10.0000 mg | ORAL_TABLET | Freq: Every day | ORAL | Status: DC
  Administered 2014-07-31 – 2014-08-03 (×4): 10 mg via ORAL
  Filled 2014-07-30 (×4): qty 1

## 2014-07-30 MED ORDER — HYDROMORPHONE HCL 1 MG/ML IJ SOLN: 1.0000 mg | INTRAMUSCULAR | Status: DC | PRN

## 2014-07-30 MED ORDER — LORAZEPAM 0.5 MG PO TABS: 0.5000 mg | ORAL_TABLET | Freq: Four times a day (QID) | ORAL | Status: DC | PRN

## 2014-07-30 MED ORDER — SODIUM CHLORIDE 0.9 % IV BOLUS (SEPSIS)
1000.0000 mL | Freq: Once | INTRAVENOUS | Status: AC
  Administered 2014-07-30: 1000 mL via INTRAVENOUS

## 2014-07-30 MED ORDER — DIGOXIN 0.25 MG/ML IJ SOLN
0.2500 mg | Freq: Once | INTRAMUSCULAR | Status: DC
  Filled 2014-07-30: qty 1

## 2014-07-30 MED ORDER — DIGOXIN 0.25 MG/ML IJ SOLN: 0.2500 mg | Freq: Once | INTRAMUSCULAR | Status: DC

## 2014-07-30 MED ORDER — ACETAMINOPHEN 650 MG RE SUPP: 650.0000 mg | Freq: Four times a day (QID) | RECTAL | Status: DC | PRN

## 2014-07-30 MED ORDER — DIGOXIN 0.25 MG/ML IJ SOLN
0.5000 mg | Freq: Once | INTRAMUSCULAR | Status: AC
  Administered 2014-07-30: 0.5 mg via INTRAVENOUS
  Filled 2014-07-30: qty 2

## 2014-07-30 MED ORDER — APIXABAN 5 MG PO TABS
5.0000 mg | ORAL_TABLET | Freq: Two times a day (BID) | ORAL | Status: DC
  Administered 2014-07-30 – 2014-08-03 (×8): 5 mg via ORAL
  Filled 2014-07-30 (×8): qty 1

## 2014-07-30 MED ORDER — DIGOXIN 0.25 MG/ML IJ SOLN
0.2500 mg | Freq: Every day | INTRAMUSCULAR | Status: DC
  Filled 2014-07-30: qty 1

## 2014-07-30 MED ORDER — IPRATROPIUM BROMIDE 0.02 % IN SOLN: 0.5000 mg | Freq: Four times a day (QID) | RESPIRATORY_TRACT | Status: DC

## 2014-07-30 MED ORDER — ALBUTEROL SULFATE (2.5 MG/3ML) 0.083% IN NEBU: 2.5000 mg | INHALATION_SOLUTION | Freq: Four times a day (QID) | RESPIRATORY_TRACT | Status: DC | PRN

## 2014-07-30 MED ORDER — ALBUTEROL SULFATE HFA 108 (90 BASE) MCG/ACT IN AERS: 2.0000 | INHALATION_SPRAY | Freq: Four times a day (QID) | RESPIRATORY_TRACT | Status: DC | PRN

## 2014-07-30 MED ORDER — ONDANSETRON HCL 4 MG/2ML IJ SOLN: 4.0000 mg | Freq: Four times a day (QID) | INTRAMUSCULAR | Status: DC | PRN

## 2014-07-30 MED ORDER — DILTIAZEM HCL 25 MG/5ML IV SOLN
10.0000 mg | Freq: Once | INTRAVENOUS | Status: AC
  Administered 2014-07-30: 10 mg via INTRAVENOUS
  Filled 2014-07-30: qty 5

## 2014-07-30 MED ORDER — METOPROLOL TARTRATE 1 MG/ML IV SOLN: 2.5000 mg | Freq: Four times a day (QID) | INTRAVENOUS | Status: DC | PRN

## 2014-07-30 MED ORDER — HYDROCODONE-ACETAMINOPHEN 5-325 MG PO TABS
1.0000 | ORAL_TABLET | ORAL | Status: DC | PRN
  Administered 2014-07-30 – 2014-08-02 (×2): 1 via ORAL
  Filled 2014-07-30 (×3): qty 1

## 2014-07-30 MED ORDER — ALUM & MAG HYDROXIDE-SIMETH 200-200-20 MG/5ML PO SUSP
30.0000 mL | Freq: Four times a day (QID) | ORAL | Status: DC | PRN
Start: 2014-07-30 — End: 2014-08-03
  Administered 2014-07-30: 30 mL via ORAL
  Filled 2014-07-30: qty 30

## 2014-07-30 MED ORDER — DIGOXIN 0.25 MG/ML IJ SOLN
0.2500 mg | Freq: Once | INTRAMUSCULAR | Status: AC
  Administered 2014-07-30: 0.25 mg via INTRAVENOUS
  Filled 2014-07-30: qty 1

## 2014-07-30 MED ORDER — GUAIFENESIN-DM 100-10 MG/5ML PO SYRP: 5.0000 mL | ORAL_SOLUTION | ORAL | Status: DC | PRN

## 2014-07-30 MED ORDER — LORAZEPAM 2 MG/ML IJ SOLN
0.5000 mg | Freq: Once | INTRAMUSCULAR | Status: AC
  Administered 2014-07-30: 0.5 mg via INTRAVENOUS
  Filled 2014-07-30: qty 1

## 2014-07-30 MED ORDER — METOPROLOL TARTRATE 25 MG PO TABS
25.0000 mg | ORAL_TABLET | Freq: Two times a day (BID) | ORAL | Status: DC
  Filled 2014-07-30: qty 1

## 2014-07-30 NOTE — ED Notes (Signed)
Dr. Harwani at bedside. 

## 2014-07-30 NOTE — ED Notes (Signed)
PA updated on pt's vitals. Bolus completed.
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
## 2014-07-30 NOTE — ED Notes (Signed)
Lindwood Coke, RN on 3W regarding pt.  NS 250 cc bolus and medications ordered to be administered.  Vitals to be rechecked after fluid bolus- pt has been hypotensive.  3W unable to take pt at this time due to BP.

## 2014-07-30 NOTE — ED Notes (Signed)
2 RN's attempted blood draws; phleb called.

## 2014-07-30 NOTE — ED Notes (Signed)
Per EMS - pt hx AFib, recently on new meds but has been noncompliant d/t side effects. EMS educated pt on importance of compliance w/ meds. BP 110/90, AFib on monitor, 116bpm, 16RR, 100% RA. Denies chest pain/pressure/tightness/shortness of breath. [facility] ([address] gave pt 50mg  dig and 100mg  lopressor with initial symptom onset. Pt hx anxiety. 20G Right AC

## 2014-07-30 NOTE — ED Provider Notes (Signed)
CSN: [id number]     Arrival date & time 07/30/14  1248 History   First MD Initiated Contact with Patient 07/30/14 1257     Chief Complaint  Patient presents with  . Atrial Fibrillation     (Consider location/radiation/quality/duration/timing/severity/associated sxs/prior Treatment) HPI   Pt with hx afib, systolic heart failure, alcohol abuse on eliquis who has taken himself off all of his home medication except eliquis for the past several days p/w chest pain (squeezing), SOB, palpitations, lightheadedness, diaphoresis that began at 11:40am.  He took all of his medications today, including double dose of his metoprolol and digoxin prior to arrival.  Also notes upper abdominal pain, diarrhea that has been going on for 3 days.  Denies  leg swelling, fevers, chills, bloody stool.  Dr Sharyn Lull is patient's cardiologist, has recommended cardioversion with TEE but pt declined initially as he was scared.    Past Medical History  Diagnosis Date  . Hypertension   . Alcoholism /alcohol abuse   . Anxiety and depression   . Tobacco abuse   . Atrial fibrillation    Past Surgical History  Procedure Laterality Date  . Hernia repair      UHR x2   Family History  Problem Relation Age of Onset  . Heart attack Mother   . Heart attack Father   . Hypertension Mother   . Hypertension Father    History  Substance Use Topics  . Smoking status: Former Smoker -- 1.00 packs/day for 30 years    Types: Cigarettes    Quit date: 05/02/2014  . Smokeless tobacco: Never Used  . Alcohol Use: Yes     Comment: pt states none in 5 months (01/2014)    Review of Systems  All other systems reviewed and are negative.     Allergies  Bee venom  Home Medications   Prior to Admission medications   Medication Sig Start Date End Date Taking? Authorizing Provider  albuterol (PROVENTIL HFA;VENTOLIN HFA) 108 (90 BASE) MCG/ACT inhaler Inhale 2 puffs into the lungs every 6 (six) hours as needed for wheezing or  shortness of breath. 07/08/14   Standley Brooking, MD  apixaban (ELIQUIS) 5 MG TABS tablet Take 1 tablet (5 mg total) by mouth 2 (two) times daily. 07/08/14   Standley Brooking, MD  digoxin (LANOXIN) 0.25 MG tablet Take 1 tablet (0.25 mg total) by mouth daily. 07/08/14   Standley Brooking, MD  lisinopril (PRINIVIL,ZESTRIL) 2.5 MG tablet Take 1 tablet (2.5 mg total) by mouth daily. 07/08/14   Standley Brooking, MD  metoprolol (LOPRESSOR) 50 MG tablet Take 1 tablet (50 mg total) by mouth 2 (two) times daily. 07/08/14   Standley Brooking, MD  predniSONE (DELTASONE) 10 MG tablet Take 1 tablet (10 mg total) by mouth daily with breakfast. 07/08/14   Standley Brooking, MD   BP 92/70 mmHg  Pulse 72  Temp(Src) 97.2 F (36.2 C) (Oral)  Resp 21  SpO2 72% Physical Exam  Constitutional: He appears well-developed and well-nourished. No distress.  HENT:  Head: Normocephalic and atraumatic.  Eyes: Conjunctivae are normal.  Neck: Neck supple.  Cardiovascular: An irregular rhythm present. Tachycardia present.   Pulmonary/Chest: Effort normal and breath sounds normal. No respiratory distress. He has no wheezes. He has no rales.  Abdominal: Soft. He exhibits no distension and no mass. There is no tenderness. There is no rebound and no guarding.  Neurological: He is alert. He exhibits normal muscle tone.  Skin: He is not  diaphoretic.  Nursing note and vitals reviewed.   ED Course  Procedures (including critical care time) Labs Review Labs Reviewed  BASIC METABOLIC PANEL - Abnormal; Notable for the following:    Potassium 5.2 (*)    CO2 19 (*)    Calcium 8.6 (*)    All other components within normal limits  BRAIN NATRIURETIC PEPTIDE - Abnormal; Notable for the following:    B Natriuretic Peptide 780.5 (*)    All other components within normal limits  PROTIME-INR - Abnormal; Notable for the following:    Prothrombin Time 17.1 (*)    All other components within normal limits  DIGOXIN LEVEL - Abnormal; Notable  for the following:    Digoxin Level 0.7 (*)    All other components within normal limits  HEPATIC FUNCTION PANEL - Abnormal; Notable for the following:    Total Protein 5.6 (*)    Albumin 3.1 (*)    AST 176 (*)    ALT 130 (*)    All other components within normal limits  LIPASE, BLOOD - Abnormal; Notable for the following:    Lipase 17 (*)    All other components within normal limits  CBC  ETHANOL  I-STAT TROPOININ, ED    Imaging Review Dg Chest Port 1 View  07/30/2014   CLINICAL DATA:  Atrial fibrillation.  Noncompliance with medication.  EXAM: PORTABLE CHEST - 1 VIEW  COMPARISON:  07/02/2014.  FINDINGS: Enlargement of the cardiopericardial silhouette. Interstitial edema at the lung bases. No airspace consolidation or pleural effusion. Monitoring leads project over the chest. Calcifications at the LEFT hilum which could be vascular or old granulomatous disease.  IMPRESSION: Cardiomegaly and basilar interstitial pulmonary edema.   Electronically Signed   By: Andreas Newport M.D.   On: 07/30/2014 13:37     EKG Interpretation   Date/Time:  Wednesday July 30 2014 12:51:27 EDT Ventricular Rate:  122 PR Interval:    QRS Duration: 83 QT Interval:  326 QTC Calculation: 464 R Axis:   -18 Text Interpretation:  Atrial fibrillation Borderline left axis deviation  Low voltage, extremity leads Consider anterior infarct rate has improved  since last tracing Confirmed by Mirian Mo [phone]) on 07/30/2014  1:12:50 PM       1:28 PM Discussed pt with Dr Littie Deeds who will see pt.    4:35 PM I spoke with Dr Sharyn Lull who is willing to consult, requests admission to [facility] and will consult if they call him to request it.    4:46 PM I spoke with Dr Isidoro Donning, [facility], who accepts admission, requests that I call Dr Sharyn Lull again to request consult.    Dr Sharyn Lull agrees to consult.    MDM   Final diagnoses:  Atrial fibrillation with rapid ventricular response  Chest pain, unspecified  chest pain type    Pt with hx Afib, systolic heart failure, alcoholism who took himself off of his medications 4 days ago p/w chest pain, SOB, diaphoresis, lightheadedness, found to be in Afib with RVR with hypotension.  Pt took double doses of two of his medications, labetolol and digoxin, prior to coming in.  One dose of  diltiazem without significant improvement.  [facility] to admit pt, Dr Sharyn Lull to consult.      Trixie Dredge, PA-C 07/30/14 1724  Mirian Mo, MD 07/31/14 [phone]

## 2014-07-30 NOTE — H&P (Signed)
History and Physical        Hospital Admission Note Date: 07/30/2014  Patient name: [name] Medical record number: [id number] Date of birth: 03/07/1965 Age: 49 y.o. Gender: male  PCP: Pcp Not In System  Referring physician: Ileana Ladd PA   Chief Complaint:  Chest tightness with palpitations  HPI: Patient is a 49 year old male with atrial fibrillation on eliquis, chronic systolic CHF, alcohol abuse, nicotine abuse , anxiety, depression presented to ED with chest tightness and palpitations. [facility] reported [facility] noon when he started having palpitations and chest tightness. Patient also reported that he was recently started on new medications for his heart however due to side effects he stopped all of them except eliquis. He reports that his cardiologist, Dr. Sharyn Lull was planning on doing ablation on him. He reports nausea and dizziness but no vomiting, abdominal pain, fevers or chills or any productive cough. In the ER patient was noted to have a heart rate in 130s to 140s, with atrial fibrillation and RVR with BP low in 80s. Patient's cardiologist, Dr Sharyn Lull was consulted and requested [facility] to admit the patient. BNP 780, troponin 0.01, elevated LFTs, potassium 5.2, CBC unremarkable   Review of Systems:  Constitutional: Denies fever, chills, diaphoresis, poor appetite and fatigue.  HEENT: Denies photophobia, eye pain, redness, hearing loss, ear pain, congestion, sore throat, rhinorrhea, sneezing, mouth sores, trouble swallowing, neck pain, neck stiffness and tinnitus.   Respiratory: Please see history of present illness Cardiovascular: Please see history of present illness Gastrointestinal: Denies nausea, vomiting, abdominal pain, diarrhea, constipation, blood in stool and abdominal distention.  Genitourinary: Denies dysuria,  urgency, frequency, hematuria, flank pain and difficulty urinating.  Musculoskeletal: Denies myalgias, back pain, joint swelling, arthralgias and gait problem.  Skin: Denies pallor, rash and wound.  Neurological: Denies seizures, syncope, numbness and headaches.  Hematological: Denies adenopathy. Easy bruising, personal or family bleeding history  Psychiatric/Behavioral: Denies any current suicidal ideation, mood changes, confusion, nervousness, sleep disturbance and agitation  Past Medical History: Past Medical History  Diagnosis Date  . Hypertension   . Alcoholism /alcohol abuse   . Anxiety and depression   . Tobacco abuse   . Atrial fibrillation     Past Surgical History  Procedure Laterality Date  . Hernia repair      UHR x2    Medications: Prior to Admission medications   Medication Sig Start Date End Date Taking? Authorizing Provider  albuterol (PROVENTIL HFA;VENTOLIN HFA) 108 (90 BASE) MCG/ACT inhaler Inhale 2 puffs into the lungs every 6 (six) hours as needed for wheezing or shortness of breath. 07/08/14  Yes Standley Brooking, MD  apixaban (ELIQUIS) 5 MG TABS tablet Take 1 tablet (5 mg total) by mouth 2 (two) times daily. 07/08/14  Yes Standley Brooking, MD  aspirin 325 MG tablet Take 325 mg by mouth every 6 (six) hours as needed for mild pain.   Yes Historical Provider, MD  Camphor-Eucalyptus-Menthol (MEDICATED CHEST RUB) 4.73-1.2-2.6 % OINT Apply 1 application topically daily as needed (congestion).   Yes Historical Provider, MD  digoxin (LANOXIN) 0.25 MG tablet Take 1 tablet (0.25 mg total) by mouth daily.  07/08/14  Yes Standley Brooking, MD  hydrOXYzine (VISTARIL) 25 MG capsule Take 25 mg by mouth 4 (four) times daily as needed for anxiety.   Yes Historical Provider, MD  lisinopril (PRINIVIL,ZESTRIL) 2.5 MG tablet Take 1 tablet (2.5 mg total) by mouth daily. 07/08/14  Yes Standley Brooking, MD  metoprolol (LOPRESSOR) 50 MG tablet Take 1 tablet (50 mg total) by mouth 2 (two)  times daily. 07/08/14  Yes Standley Brooking, MD  predniSONE (DELTASONE) 10 MG tablet Take 1 tablet (10 mg total) by mouth daily with breakfast. 07/08/14  Yes Standley Brooking, MD  sertraline (ZOLOFT) 50 MG tablet Take 50 mg by mouth daily.   Yes Historical Provider, MD    Allergies:   Allergies  Allergen Reactions  . Bee Venom Anaphylaxis    Uses epi pen-- last used 08/23/11    Social History:  reports that he quit smoking about 2 months ago. His smoking use included Cigarettes. He has a 30 pack-year smoking history. He has never used smokeless tobacco. He reports that he drinks alcohol. He reports that he does not use illicit drugs.  Family History: Family History  Problem Relation Age of Onset  . Heart attack Mother   . Heart attack Father   . Hypertension Mother   . Hypertension Father     Physical Exam: Blood pressure 84/62, pulse 106, temperature 98.4 F (36.9 C), temperature source Oral, resp. rate 20, SpO2 94 %. General: Alert, awake, oriented x3, in no acute distress. HEENT: normocephalic, atraumatic, anicteric sclera, pink conjunctiva, pupils equal and reactive to light and accomodation, oropharynx clear Neck: supple, no masses or lymphadenopathy, no goiter, no bruits  Heart: Irregularly irregular, tachycardia  Lungs: Clear to auscultation bilaterally, no wheezing, rales or rhonchi. Abdomen: Soft, nontender, nondistended, positive bowel sounds, no masses. Extremities: No clubbing, cyanosis or edema with positive pedal pulses. Neuro: Grossly intact, no focal neurological deficits, strength 5/5 upper and lower extremities bilaterally Psych: alert and oriented x 3, normal mood and affect Skin: no rashes or lesions, warm and dry   LABS on Admission:  Basic Metabolic Panel:  Recent Labs Lab 07/30/14 1408  NA 135  K 5.2*  CL 105  CO2 19*  GLUCOSE 98  BUN 17  CREATININE 1.21  CALCIUM 8.6*   Liver Function Tests:  Recent Labs Lab 07/30/14 1524  AST 176*  ALT  130*  ALKPHOS 88  BILITOT 1.0  PROT 5.6*  ALBUMIN 3.1*    Recent Labs Lab 07/30/14 1524  LIPASE 17*   No results for input(s): AMMONIA in the last 168 hours. CBC:  Recent Labs Lab 07/30/14 1408  WBC 5.9  HGB 15.0  HCT 44.0  MCV 95.0  PLT 152   Cardiac Enzymes: No results for input(s): CKTOTAL, CKMB, CKMBINDEX, TROPONINI in the last 168 hours. BNP: Invalid input(s): POCBNP CBG: No results for input(s): GLUCAP in the last 168 hours.  Radiological Exams on Admission:  Dg Chest 2 View  07/02/2014   CLINICAL DATA:  Shortness of breath, weakness, nonproductive cough for 4 days, increasing.  EXAM: CHEST  2 VIEW  COMPARISON:  06/23/2013  FINDINGS: Cardiomegaly is unchanged. Peribronchial and interstitial opacities are unchanged, may reflect pulmonary edema, however appears chronic. The previous right lower lobe consolidation has diminished, however minimal residual opacities persist. No new consolidation. There may be small pleural effusions. No acute osseous abnormalities.  IMPRESSION: 1. Right basilar atelectasis or scarring. 2. Stable cardiomegaly. Interstitial opacities concerning for pulmonary edema, however this  may be chronic and is unchanged compared to prior.   Electronically Signed   By: Rubye Oaks M.D.   On: 07/02/2014 22:32   Nm Hepatobiliary Liver Func  07/03/2014   CLINICAL DATA:  Gallbladder wall thickening on ultrasound. No gallstones.  EXAM: NUCLEAR MEDICINE HEPATOBILIARY IMAGING  TECHNIQUE: Sequential images of the abdomen were obtained out to 60 minutes following intravenous administration of radiopharmaceutical.  RADIOPHARMACEUTICALS:  5.1 Tc-33m Choletec IV  COMPARISON:  None.  FINDINGS: There is prompt uptake and excretion of the radiopharmaceutical by the liver. There is prompt and increasing activity in the gallbladder. There is activity in central extrahepatic bile ducts and small bowel before 1 hour.  IMPRESSION: 1. Patency of cystic and common bile ducts.    Electronically Signed   By: Corlis Leak M.D.   On: 07/03/2014 16:11   Dg Chest Port 1 View  07/30/2014   CLINICAL DATA:  Atrial fibrillation.  Noncompliance with medication.  EXAM: PORTABLE CHEST - 1 VIEW  COMPARISON:  07/02/2014.  FINDINGS: Enlargement of the cardiopericardial silhouette. Interstitial edema at the lung bases. No airspace consolidation or pleural effusion. Monitoring leads project over the chest. Calcifications at the LEFT hilum which could be vascular or old granulomatous disease.  IMPRESSION: Cardiomegaly and basilar interstitial pulmonary edema.   Electronically Signed   By: Andreas Newport M.D.   On: 07/30/2014 13:37   US Abdomen Limited Ruq  07/03/2014   CLINICAL DATA:  Acute onset of right upper quadrant abdominal pain. Initial encounter.  EXAM: US ABDOMEN LIMITED - RIGHT UPPER QUADRANT  COMPARISON:  Right upper quadrant ultrasound performed 06/21/2013  FINDINGS: Gallbladder:  Diffuse gallbladder wall thickening is seen, with the gallbladder wall measuring up to 1.2 cm in thickness. No definite stones are characterized. However, a positive ultrasonographic Murphy's sign is elicited. Findings are concerning for acute cholecystitis.  Common bile duct:  Diameter: 0.3 cm, within normal limits in caliber.  Liver:  No focal lesion identified. Within normal limits in parenchymal echogenicity.  A small right pleural effusion is noted.  IMPRESSION: 1. Diffuse gallbladder wall thickening noted, with a positive ultrasonographic Murphy's sign. Gallbladder wall thickening is worsened from the prior study. No definite stones seen. Findings are concerning for acute cholecystitis; this could reflect acalculous cholecystitis. 2. Small right pleural effusion noted.   Electronically Signed   By: Roanna Raider M.D.   On: 07/03/2014 01:28    *I have personally reviewed the images above*  EKG: Independently reviewed. *Rate 102, atrial fibrillation with RVR   Assessment/Plan Principal Problem:    Atrial fibrillation with RVR: Likely due to severe noncompliance, currently hypotensive - Admit to stepdown unit, unfortunately cannot give Cardizem or beta blocker due to hypotension with BP in 80s - will give 1 dose of digoxin 0.5 mg IV 1 and restart oral Cardizem - Patient may need amiodarone drip if no significant improvement in the heart rate, will defer to cardiology. Dr. Kendal Hymen has been consulted by EDP and will be evaluating the patient. He is taking eliquis, may need cardioversion if no significant improvement. - Obtain 2-D echocardiogram, serial troponins, TSH   Active Problems:   Alcoholism /alcohol abuse - Patient reports that he quit drinking alcohol a month ago, on June 1 monitor closely for any alcohol withdrawals  Nicotine use - Again patient reported that he quit smoking on June 1.     Anxiety and depression -Currently stable, place on Ativan as needed for anxiety, no suicide ideations at this time  Chronic severe systolic CHF  - Recent 2-D echo on 6/2 showed severely reduced systolic function, no regional wall motion abnormalities, severe mitral regurgitation. Unfortunately cannot give IV fluid boluses due to severely reduced EF, heart rate needs to be controlled, will defer to cardiology for further workup.   DVT prophylaxis: on eliquis  CODE STATUS: Discussed in detail with the patient he opted for full CODE STATUS   Family Communication: Admission, patients condition and plan of care including tests being ordered have been discussed with the patient who indicates understanding and agree with the plan and Code Status  Disposition plan: Further plan will depend as patient's clinical course evolves and further radiologic and laboratory data become available.   Time Spent on Admission: 1 hour  Pallie Swigert M.D. [facility] 07/30/2014, 5:06 PM Pager: [phone]  If 7PM-7AM, please contact night-coverage [email or web address] Password [email or web address]

## 2014-07-30 NOTE — Progress Notes (Signed)
Pt resting in bed Hr 110's -130's. SBP 99/84. Md on call made aware. New order received for an extra dose of digoxin and prn lopressor. Will cont to monitor pt.

## 2014-07-30 NOTE — ED Notes (Signed)
oxygen sat level incorrect.

## 2014-07-30 NOTE — ED Notes (Signed)
Pt reports Dr. Sharyn Lull was planning on doing an ablation on him soon.

## 2014-07-31 ENCOUNTER — Other Ambulatory Visit (HOSPITAL_COMMUNITY): Payer: Self-pay

## 2014-07-31 LAB — BASIC METABOLIC PANEL
ANION GAP: 6 (ref 5–15)
Anion gap: 6 (ref 5–15)
BUN: 19 mg/dL (ref 6–20)
BUN: 19 mg/dL (ref 6–20)
CALCIUM: 8.8 mg/dL — AB (ref 8.9–10.3)
CO2: 24 mmol/L (ref 22–32)
CO2: 24 mmol/L (ref 22–32)
CREATININE: 1.18 mg/dL (ref 0.61–1.24)
Calcium: 8.8 mg/dL — ABNORMAL LOW (ref 8.9–10.3)
Chloride: 106 mmol/L (ref 101–111)
Chloride: 106 mmol/L (ref 101–111)
Creatinine, Ser: 1.17 mg/dL (ref 0.61–1.24)
GFR calc Af Amer: >60 mL/min (ref >60)
GFR calc Af Amer: >60 mL/min (ref >60)
GFR calc non Af Amer: >60 mL/min (ref >60)
GFR calc non Af Amer: >60 mL/min (ref >60)
Glucose, Bld: 91 mg/dL (ref 65–99)
Glucose, Bld: 88 mg/dL (ref 65–99)
POTASSIUM: 4.4 mmol/L (ref 3.5–5.1)
Potassium: 4.8 mmol/L (ref 3.5–5.1)
Sodium: 136 mmol/L (ref 135–145)
Sodium: 136 mmol/L (ref 135–145)

## 2014-07-31 LAB — CBC
HCT: 42.8 % (ref 39.0–52.0)
HEMOGLOBIN: 14.7 g/dL (ref 13.0–17.0)
MCH: 32.3 pg (ref 26.0–34.0)
MCHC: 34.3 g/dL (ref 30.0–36.0)
MCV: 94.1 fL (ref 78.0–100.0)
PLATELETS: 152 10*3/uL (ref 150–400)
RBC: 4.55 MIL/uL (ref 4.22–5.81)
RDW: 13.4 % (ref 11.5–15.5)
WBC: 6 10*3/uL (ref 4.0–10.5)

## 2014-07-31 LAB — TSH: TSH: 1.012 u[IU]/mL (ref 0.350–4.500)

## 2014-07-31 LAB — TROPONIN I: Troponin I: 0.03 ng/mL (ref <0.031)

## 2014-07-31 LAB — PROTIME-INR
INR: 1.47 (ref 0.00–1.49)
Prothrombin Time: 17.9 seconds — ABNORMAL HIGH (ref 11.6–15.2)

## 2014-07-31 MED ORDER — SODIUM CHLORIDE 0.9 % IV SOLN
INTRAVENOUS | Status: DC
  Administered 2014-07-31: 08:00:00 via INTRAVENOUS

## 2014-07-31 NOTE — Consult Note (Signed)
TEE with cardioversion postponed for tomorrow due to lack of staffing/time slot. 
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
## 2014-07-31 NOTE — Discharge Instructions (Addendum)
Information on my medicine - ELIQUIS (apixaban)  Why was Eliquis prescribed for you? Eliquis was prescribed for you to reduce the risk of a blood clot forming that can cause a stroke if you have a medical condition called atrial fibrillation (a type of irregular heartbeat).  What do You need to know about Eliquis ? Take your Eliquis TWICE DAILY - one tablet in the morning and one tablet in the evening with or without food. If you have difficulty swallowing the tablet whole please discuss with your pharmacist how to take the medication safely.  Take Eliquis exactly as prescribed by your doctor and DO NOT stop taking Eliquis without talking to the doctor who prescribed the medication.  Stopping may increase your risk of developing a stroke.  Refill your prescription before you run out.  After discharge, you should have regular check-up appointments with your healthcare provider that is prescribing your Eliquis.  In the future your dose may need to be changed if your kidney function or weight changes by a significant amount or as you get older.  What do you do if you miss a dose? If you miss a dose, take it as soon as you remember on the same day and resume taking twice daily.  Do not take more than one dose of ELIQUIS at the same time to make up a missed dose.  Important Safety Information A possible side effect of Eliquis is bleeding. You should call your healthcare provider right away if you experience any of the following: ? Bleeding from an injury or your nose that does not stop. ? Unusual colored urine (red or dark brown) or unusual colored stools (red or black). ? Unusual bruising for unknown reasons. ? A serious fall or if you hit your head (even if there is no bleeding).  Some medicines may interact with Eliquis and might increase your risk of bleeding or clotting while on Eliquis. To help avoid this, consult your healthcare provider or pharmacist prior to using any new  prescription or non-prescription medications, including herbals, vitamins, non-steroidal anti-inflammatory drugs (NSAIDs) and supplements.  This website has more information on Eliquis (apixaban): http://www.eliquis.com/eliquis/home  Atrial Fibrillation Atrial fibrillation is a type of irregular heart rhythm (arrhythmia). During atrial fibrillation, the upper chambers of the heart (atria) quiver continuously in a chaotic pattern. This causes an irregular and often rapid heart rate.  Atrial fibrillation is the result of the heart becoming overloaded with disorganized signals that tell it to beat. These signals are normally released one at a time by a part of the right atrium called the sinoatrial node. They then travel from the atria to the lower chambers of the heart (ventricles), causing the atria and ventricles to contract and pump blood as they pass. In atrial fibrillation, parts of the atria outside of the sinoatrial node also release these signals. This results in two problems. First, the atria receive so many signals that they do not have time to fully contract. Second, the ventricles, which can only receive one signal at a time, beat irregularly and out of rhythm with the atria.  There are three types of atrial fibrillation:   Paroxysmal. Paroxysmal atrial fibrillation starts suddenly and stops on its own within a week.  Persistent. Persistent atrial fibrillation lasts for more than a week. It may stop on its own or with treatment.  Permanent. Permanent atrial fibrillation does not go away. Episodes of atrial fibrillation may lead to permanent atrial fibrillation. Atrial fibrillation can prevent your heart  from pumping blood normally. It increases your risk of stroke and can lead to heart failure.  CAUSES   Heart conditions, including a heart attack, heart failure, coronary artery disease, and heart valve conditions.   Inflammation of the sac that surrounds the heart  (pericarditis).  Blockage of an artery in the lungs (pulmonary embolism).  Pneumonia or other infections.  Chronic lung disease.  Thyroid problems, especially if the thyroid is overactive (hyperthyroidism).  Caffeine, excessive alcohol use, and use of some illegal drugs.   Use of some medicines, including certain decongestants and diet pills.  Heart surgery.   Birth defects.  Sometimes, no cause can be found. When this happens, the atrial fibrillation is called lone atrial fibrillation. The risk of complications from atrial fibrillation increases if you have lone atrial fibrillation and you are age 19 years or older. RISK FACTORS  Heart failure.  Coronary artery disease.  Diabetes mellitus.   High blood pressure (hypertension).   Obesity.   Other arrhythmias.   Increased age. SIGNS AND SYMPTOMS   A feeling that your heart is beating rapidly or irregularly.   A feeling of discomfort or pain in your chest.   Shortness of breath.   Sudden light-headedness or weakness.   Getting tired easily when exercising.   Urinating more often than normal (mainly when atrial fibrillation first begins).  In paroxysmal atrial fibrillation, symptoms may start and suddenly stop. DIAGNOSIS  Your health care provider may be able to detect atrial fibrillation when taking your pulse. Your health care provider may have you take a test called an ambulatory electrocardiogram (ECG). An ECG records your heartbeat patterns over a 24-hour period. You may also have other tests, such as:  Transthoracic echocardiogram (TTE). During echocardiography, sound waves are used to evaluate how blood flows through your heart.  Transesophageal echocardiogram (TEE).  Stress test. There is more than one type of stress test. If a stress test is needed, ask your health care provider about which type is best for you.  Chest X-ray exam.  Blood tests.  Computed tomography (CT). TREATMENT   Treatment may include:  Treating any underlying conditions. For example, if you have an overactive thyroid, treating the condition may correct atrial fibrillation.  Taking medicine. Medicines may be given to control a rapid heart rate or to prevent blood clots, heart failure, or a stroke.  Having a procedure to correct the rhythm of the heart:  Electrical cardioversion. During electrical cardioversion, a controlled, low-energy shock is delivered to the heart through your skin. If you have chest pain, very low blood pressure, or sudden heart failure, this procedure may need to be done as an emergency.  Catheter ablation. During this procedure, heart tissues that send the signals that cause atrial fibrillation are destroyed.  Surgical ablation. During this surgery, thin lines of heart tissue that carry the abnormal signals are destroyed. This procedure can either be an open-heart surgery or a minimally invasive surgery. With the minimally invasive surgery, small cuts are made to access the heart instead of a large opening.  Pulmonary venous isolation. During this surgery, tissue around the veins that carry blood from the lungs (pulmonary veins) is destroyed. This tissue is thought to carry the abnormal signals. HOME CARE INSTRUCTIONS   Take medicines only as directed by your health care provider. Some medicines can make atrial fibrillation worse or recur.  If blood thinners were prescribed by your health care provider, take them exactly as directed. Too much blood-thinning medicine can cause  bleeding. If you take too little, you will not have the needed protection against stroke and other problems.  Perform blood tests at home if directed by your health care provider. Perform blood tests exactly as directed.  Quit smoking if you smoke.  Do not drink alcohol.  Do not drink caffeinated beverages such as coffee, soda, and some teas. You may drink decaffeinated coffee, soda, or tea.    Maintain a healthy weight.Do not use diet pills unless your health care provider approves. They may make heart problems worse.   Follow diet instructions as directed by your health care provider.  Exercise regularly as directed by your health care provider.  Keep all follow-up visits as directed by your health care provider. This is important. PREVENTION  The following substances can cause atrial fibrillation to recur:   Caffeinated beverages.  Alcohol.  Certain medicines, especially those used for breathing problems.  Certain herbs and herbal medicines, such as those containing ephedra or ginseng.  Illegal drugs, such as cocaine and amphetamines. Sometimes medicines are given to prevent atrial fibrillation from recurring. Proper treatment of any underlying condition is also important in helping prevent recurrence.  SEEK MEDICAL CARE IF:  You notice a change in the rate, rhythm, or strength of your heartbeat.  You suddenly begin urinating more frequently.  You tire more easily when exerting yourself or exercising. SEEK IMMEDIATE MEDICAL CARE IF:   You have chest pain, abdominal pain, sweating, or weakness.  You feel nauseous.  You have shortness of breath.  You suddenly have swollen feet and ankles.  You feel dizzy.  Your face or limbs feel numb or weak.  You have a change in your vision or speech. MAKE SURE YOU:   Understand these instructions.  Will watch your condition.  Will get help right away if you are not doing well or get worse. Document Released: 01/17/2005 Document Revised: 06/03/2013 Document Reviewed: 02/28/2012 [facility] Patient Information 2015 [place], [place]. This information is not intended to replace advice given to you by your health care provider. Make sure you discuss any questions you have with your health care provider.

## 2014-07-31 NOTE — Progress Notes (Signed)
Utilization review completed. Bronda Alfred, RN, BSN. 

## 2014-07-31 NOTE — Care Management Note (Addendum)
Case Management Note  Patient Details  Name: [name] MRN: [id number] Date of Birth: [birth year]  Subjective/Objective:    Pt admitted for chest pain and palpitations.                 Action/Plan: Referral received for medication assistance. Pt is without insurance and PCP. Pt was set up previously with the [facility] for 2 previous appointments/ Pharmacy assistance and was a no show. Clinic is not taking any new pt's @ this time. CM did speak with pt in regards to missing appointments and medications. Pt stated he is going to [facility] [place] [facility]. CM did call Germaine Pomfret RN @ [facility] for clarification. CM awaiting call back. Pt also follows up with [facility]. Once pt is medically stable for d/c pt will need to be placed on generic medications at d/c. No further needs from CM at this time.    Expected Discharge Date:                  Expected Discharge Plan:  Home/Self Care  In-House Referral:     Discharge planning Services  CM Consult, Medication Assistance  Post Acute Care Choice:    Choice offered to:     DME Arranged:    DME Agency:     HH Arranged:    HH Agency:     Status of Service:     Medicare Important Message Given:    Date Medicare IM Given:    Medicare IM give by:    Date Additional Medicare IM Given:    Additional Medicare Important Message give by:     If discussed at Long Length of Stay Meetings, dates discussed:    Additional Comments: 1612 08-01-14 CM did receive call back from [facility] NP she will be able to f/u with pt once d/c and assist with Medications via the MAPP program with the [facility] Department. No further needs from CM at this time. Gala Lewandowsky, RN,BSN [phone]   Gala Lewandowsky, RN 07/31/2014, 11:21 AM

## 2014-07-31 NOTE — Progress Notes (Signed)
PROGRESS NOTE  [name] ZOX:[id number] DOB: 12-17-1965 DOA: 07/30/2014 PCP: Pcp Not In System  Assessment/Plan: Atrial fibrillation with RVR: Likely due to severe noncompliance, currently hypotensive - per cards: TEE cardioversion -recent echo which showed markedly depressed LV systolic function-hold Cardizem for now. -low-dose beta blockers and agree with digoxin. -no IV amiodarone to convert him in view of noncompliance to anticoagulate :  - serial troponins ok, TSH ok   Alcoholism /alcohol abuse - Patient reports that he quit drinking alcohol a month ago, on June 1 monitor closely for any alcohol withdrawals  Nicotine use - Again patient reported that he quit smoking on June 1.    Anxiety and depression -Currently stable, place on Ativan as needed for anxiety, no suicide ideations at this time  Chronic severe systolic CHF  - Recent 2-D echo on 6/2 showed severely reduced systolic function, no regional wall motion abnormalities, severe mitral regurgitation. Unfortunately cannot give IV fluid boluses due to severely reduced EF, heart rate needs to be controlled, will defer to cardiology for further workup.    Code Status: full Family Communication: patient Disposition Plan:    Consultants:  cards  Procedures:      HPI/Subjective: No SOB, no CP Anxious about procedure  Objective: Filed Vitals:   07/31/14 0818  BP: 119/97  Pulse: 83  Temp:   Resp:     Intake/Output Summary (Last 24 hours) at 07/31/14 1027 Last data filed at 07/31/14 0700  Gross per 24 hour  Intake      0 ml  Output    800 ml  Net   -800 ml   Filed Weights   07/30/14 1820 07/31/14 0525  Weight: 81.149 kg (178 lb 14.4 oz) 82.419 kg (181 lb 11.2 oz)    Exam:   General:  A+Ox3, NAD  Cardiovascular: irr  Respiratory: clear  Abdomen: +Bs, soft  Musculoskeletal: no edema  Data Reviewed: Basic Metabolic Panel:  Recent Labs Lab 07/30/14 1408 07/31/14 0622  07/31/14 0833  NA 135 136 136  K 5.2* 4.4 4.8  CL 105 106 106  CO2 19* 24 24  GLUCOSE 98 91 88  BUN CREATININE 1.21 1.17 1.18  CALCIUM 8.6* 8.8* 8.8*   Liver Function Tests:  Recent Labs Lab 07/30/14 1524  AST 176*  ALT 130*  ALKPHOS 88  BILITOT 1.0  PROT 5.6*  ALBUMIN 3.1*    Recent Labs Lab 07/30/14 1524  LIPASE 17*   No results for input(s): AMMONIA in the last 168 hours. CBC:  Recent Labs Lab 07/30/14 1408 07/31/14 0622  WBC 5.9 6.0  HGB 15.0 14.7  HCT 44.0 42.8  MCV 95.0 94.1  PLT 152 152   Cardiac Enzymes:  Recent Labs Lab 07/30/14 1524 07/30/14 2321 07/31/14 0622  TROPONINI 0.03 <0.03 0.03   BNP (last 3 results)  Recent Labs  07/02/14 2348 07/30/14 1353  BNP 873.2* 780.5*    ProBNP (last 3 results) No results for input(s): PROBNP in the last 8760 hours.  CBG: No results for input(s): GLUCAP in the last 168 hours.  Recent Results (from the past 240 hour(s))  MRSA PCR Screening     Status: None   Collection Time: 07/30/14  6:36 PM  Result Value Ref Range Status   MRSA by PCR NEGATIVE NEGATIVE Final    Comment:        The GeneXpert MRSA Assay (FDA approved for NASAL specimens only), is one component of a comprehensive MRSA colonization surveillance program.  It is not intended to diagnose MRSA infection nor to guide or monitor treatment for MRSA infections.      Studies: Dg Chest Port 1 View  07/30/2014   CLINICAL DATA:  Atrial fibrillation.  Noncompliance with medication.  EXAM: PORTABLE CHEST - 1 VIEW  COMPARISON:  07/02/2014.  FINDINGS: Enlargement of the cardiopericardial silhouette. Interstitial edema at the lung bases. No airspace consolidation or pleural effusion. Monitoring leads project over the chest. Calcifications at the LEFT hilum which could be vascular or old granulomatous disease.  IMPRESSION: Cardiomegaly and basilar interstitial pulmonary edema.   Electronically Signed   By: Andreas Newport M.D.   On:  07/30/2014 13:37    Scheduled Meds: . apixaban  5 mg Oral BID  . digoxin  0.25 mg Oral Daily  . metoprolol tartrate  25 mg Oral BID  . predniSONE  10 mg Oral Q breakfast  . sodium chloride  3 mL Intravenous Q12H   Continuous Infusions: . sodium chloride 20 mL/hr at 07/31/14 0816   Antibiotics Given (last 72 hours)    None      Principal Problem:   Atrial fibrillation with RVR Active Problems:   Alcoholism /alcohol abuse   Anxiety and depression   Chest pain   Hypotension    Time spent: 35 min    Mikhaela Zaugg  [facility] Pager [phone] If 7PM-7AM, please contact night-coverage at [email or web address], password [facility] 07/31/2014, 10:27 AM  LOS: 1 day

## 2014-08-01 ENCOUNTER — Encounter (HOSPITAL_COMMUNITY): Payer: Self-pay | Admitting: *Deleted

## 2014-08-01 ENCOUNTER — Inpatient Hospital Stay (HOSPITAL_COMMUNITY): Payer: Self-pay

## 2014-08-01 ENCOUNTER — Encounter (HOSPITAL_COMMUNITY)
Admission: EM | Disposition: A | Discharge status: Home / Self Care | Payer: Self-pay | Source: Home / Self Care | Attending: Internal Medicine

## 2014-08-01 ENCOUNTER — Inpatient Hospital Stay (HOSPITAL_COMMUNITY): Payer: Self-pay | Admitting: Anesthesiology

## 2014-08-01 HISTORY — PX: TEE WITHOUT CARDIOVERSION: SHX5443

## 2014-08-01 LAB — CBC
HEMATOCRIT: 46.2 % (ref 39.0–52.0)
Hemoglobin: 15.9 g/dL (ref 13.0–17.0)
MCH: 32.3 pg (ref 26.0–34.0)
MCHC: 34.4 g/dL (ref 30.0–36.0)
MCV: 93.9 fL (ref 78.0–100.0)
Platelets: 147 10*3/uL — ABNORMAL LOW (ref 150–400)
RBC: 4.92 MIL/uL (ref 4.22–5.81)
RDW: 13.4 % (ref 11.5–15.5)
WBC: 7 10*3/uL (ref 4.0–10.5)

## 2014-08-01 LAB — BASIC METABOLIC PANEL
Anion gap: 10 (ref 5–15)
BUN: 17 mg/dL (ref 6–20)
CHLORIDE: 104 mmol/L (ref 101–111)
CO2: 22 mmol/L (ref 22–32)
Calcium: 8.2 mg/dL — ABNORMAL LOW (ref 8.9–10.3)
Creatinine, Ser: 1.04 mg/dL (ref 0.61–1.24)
GFR calc Af Amer: >60 mL/min (ref >60)
GLUCOSE: 96 mg/dL (ref 65–99)
POTASSIUM: 4.2 mmol/L (ref 3.5–5.1)
Sodium: 136 mmol/L (ref 135–145)

## 2014-08-01 SURGERY — ECHOCARDIOGRAM, TRANSESOPHAGEAL
Anesthesia: Monitor Anesthesia Care

## 2014-08-01 MED ORDER — PROPOFOL 10 MG/ML IV BOLUS
INTRAVENOUS | Status: DC | PRN
  Administered 2014-08-01 (×2): 10 mg via INTRAVENOUS

## 2014-08-01 MED ORDER — LIDOCAINE HCL (CARDIAC) 20 MG/ML IV SOLN
INTRAVENOUS | Status: DC | PRN
  Administered 2014-08-01: 10 mg via INTRAVENOUS

## 2014-08-01 MED ORDER — ONDANSETRON HCL 4 MG/2ML IJ SOLN: 4.0000 mg | Freq: Once | INTRAMUSCULAR | Status: DC | PRN

## 2014-08-01 MED ORDER — LACTATED RINGERS IV SOLN
INTRAVENOUS | Status: DC | PRN
  Administered 2014-08-01: 08:00:00 via INTRAVENOUS

## 2014-08-01 MED ORDER — LACTATED RINGERS IV SOLN
INTRAVENOUS | Status: DC
  Administered 2014-08-01: 08:00:00 via INTRAVENOUS

## 2014-08-01 MED ORDER — LORAZEPAM 2 MG/ML IJ SOLN
0.5000 mg | Freq: Once | INTRAMUSCULAR | Status: AC
  Administered 2014-08-01: 0.5 mg via INTRAVENOUS
  Filled 2014-08-01: qty 1

## 2014-08-01 MED ORDER — FENTANYL CITRATE (PF) 250 MCG/5ML IJ SOLN
INTRAMUSCULAR | Status: DC | PRN
  Administered 2014-08-01 (×3): 50 ug via INTRAVENOUS

## 2014-08-01 MED ORDER — MIDAZOLAM HCL 5 MG/5ML IJ SOLN
INTRAMUSCULAR | Status: DC | PRN
  Administered 2014-08-01 (×2): 1 mg via INTRAVENOUS

## 2014-08-01 MED ORDER — FENTANYL CITRATE (PF) 100 MCG/2ML IJ SOLN: 25.0000 ug | INTRAMUSCULAR | Status: DC | PRN

## 2014-08-01 NOTE — Anesthesia Preprocedure Evaluation (Addendum)
Anesthesia Evaluation  Patient identified by MRN, date of birth, ID band Patient awake    Reviewed: Allergy & Precautions, NPO status , Patient's Chart, lab work & pertinent test results  Airway Mallampati: II  TM Distance: >3 FB Neck ROM: Full    Dental  (+) Poor Dentition, Dental Advisory Given   Pulmonary former smoker,  breath sounds clear to auscultation        Cardiovascular hypertension, Rhythm:Irregular Rate:Normal     Neuro/Psych    GI/Hepatic (+)     substance abuse  alcohol use,   Endo/Other    Renal/GU      Musculoskeletal   Abdominal   Peds  Hematology   Anesthesia Other Findings   Reproductive/Obstetrics                           Anesthesia Physical Anesthesia Plan  ASA: III  Anesthesia Plan: General   Post-op Pain Management:    Induction: Intravenous  Airway Management Planned: Mask  Additional Equipment:   Intra-op Plan:   Post-operative Plan:   Informed Consent: I have reviewed the patients History and Physical, chart, labs and discussed the procedure including the risks, benefits and alternatives for the proposed anesthesia with the patient or authorized representative who has indicated his/her understanding and acceptance.   Dental advisory given  Plan Discussed with: CRNA, Anesthesiologist and Surgeon  Anesthesia Plan Comments:        Anesthesia Quick Evaluation

## 2014-08-01 NOTE — Progress Notes (Signed)
  Echocardiogram Echocardiogram Transesophageal has been performed.  Delcie Roch 08/01/2014, 8:51 AM

## 2014-08-01 NOTE — Progress Notes (Addendum)
PROGRESS NOTE  [name] BJY:[id number] DOB: 08/14/1965 DOA: 07/30/2014 PCP: Pcp Not In System  Assessment/Plan: Atrial fibrillation with RVR: Likely due to severe noncompliance, currently hypotensive- [place] vasc 2-- 4 - per cards: TEE done but no cardioversion as patient had Small left atrial appendage clot. -recent echo which showed markedly depressed LV systolic function-hold Cardizem for now. -low-dose beta blockers and agree with digoxin. -no IV amiodarone to convert him in view of noncompliance to anticoagulate :  - serial troponins ok, TSH ok  C/o left leg numbness/dizziness -MRI ordered to r/o CVA - if negative will need PT Eval -patient had foot dragging when ambulated by nursing  Alcoholism /alcohol abuse - Patient reports that he quit drinking alcohol a month ago, on June 1 monitor closely for any alcohol withdrawals  Nicotine use - Again patient reported that he quit smoking on June 1.    Anxiety and depression -Currently stable, place on Ativan as needed for anxiety, no suicide ideations at this time  Chronic severe systolic CHF  - stable  Code Status: full Family Communication: patient Disposition Plan:    Consultants:  cards  Procedures:      HPI/Subjective: C/o left leg numbess and dizziness- room spinning- slightly better with   Objective: Filed Vitals:   08/01/14 1003  BP: 122/84  Pulse: 90  Temp:   Resp:     Intake/Output Summary (Last 24 hours) at 08/01/14 1206 Last data filed at 08/01/14 1007  Gross per 24 hour  Intake    203 ml  Output   1250 ml  Net  -1047 ml   Filed Weights   07/30/14 1820 07/31/14 0525 08/01/14 0400  Weight: 81.149 kg (178 lb 14.4 oz) 82.419 kg (181 lb 11.2 oz) 82.677 kg (182 lb 4.3 oz)    Exam:   General:  A+Ox3, NAD  Cardiovascular: irr  Respiratory: clear  Abdomen: +Bs, soft  Musculoskeletal: no appreciable weakness between left and right legs  Data Reviewed: Basic Metabolic  Panel:  Recent Labs Lab 07/30/14 1408 07/31/14 0622 07/31/14 0833 08/01/14 0340  NA 135 136 136 136  K 5.2* 4.4 4.8 4.2  CL 105 106 106 104  CO2 19* GLUCOSE 98 91 88 96  BUN CREATININE 1.21 1.17 1.18 1.04  CALCIUM 8.6* 8.8* 8.8* 8.2*   Liver Function Tests:  Recent Labs Lab 07/30/14 1524  AST 176*  ALT 130*  ALKPHOS 88  BILITOT 1.0  PROT 5.6*  ALBUMIN 3.1*    Recent Labs Lab 07/30/14 1524  LIPASE 17*   No results for input(s): AMMONIA in the last 168 hours. CBC:  Recent Labs Lab 07/30/14 1408 07/31/14 0622 08/01/14 0340  WBC 5.9 6.0 7.0  HGB 15.0 14.7 15.9  HCT 44.0 42.8 46.2  MCV 95.0 94.1 93.9  PLT 152 152 147*   Cardiac Enzymes:  Recent Labs Lab 07/30/14 1524 07/30/14 2321 07/31/14 0622  TROPONINI 0.03 <0.03 0.03   BNP (last 3 results)  Recent Labs  07/02/14 2348 07/30/14 1353  BNP 873.2* 780.5*    ProBNP (last 3 results) No results for input(s): PROBNP in the last 8760 hours.  CBG: No results for input(s): GLUCAP in the last 168 hours.  Recent Results (from the past 240 hour(s))  MRSA PCR Screening     Status: None   Collection Time: 07/30/14  6:36 PM  Result Value Ref Range Status   MRSA by PCR NEGATIVE NEGATIVE Final  Comment:        The GeneXpert MRSA Assay (FDA approved for NASAL specimens only), is one component of a comprehensive MRSA colonization surveillance program. It is not intended to diagnose MRSA infection nor to guide or monitor treatment for MRSA infections.      Studies: Dg Chest Port 1 View  07/30/2014   CLINICAL DATA:  Atrial fibrillation.  Noncompliance with medication.  EXAM: PORTABLE CHEST - 1 VIEW  COMPARISON:  07/02/2014.  FINDINGS: Enlargement of the cardiopericardial silhouette. Interstitial edema at the lung bases. No airspace consolidation or pleural effusion. Monitoring leads project over the chest. Calcifications at the LEFT hilum which could be vascular or old  granulomatous disease.  IMPRESSION: Cardiomegaly and basilar interstitial pulmonary edema.   Electronically Signed   By: Andreas Newport M.D.   On: 07/30/2014 13:37    Scheduled Meds: . apixaban  5 mg Oral BID  . digoxin  0.25 mg Oral Daily  . metoprolol tartrate  25 mg Oral BID  . predniSONE  10 mg Oral Q breakfast  . sodium chloride  3 mL Intravenous Q12H   Continuous Infusions:   Antibiotics Given (last 72 hours)    None      Principal Problem:   Atrial fibrillation with RVR Active Problems:   Alcoholism /alcohol abuse   Anxiety and depression   Chest pain   Hypotension    Time spent: 35 min    VANN, JESSICA  [facility] Pager [phone] If 7PM-7AM, please contact night-coverage at [email or web address], password [facility] 08/01/2014, 12:06 PM  LOS: 2 days

## 2014-08-01 NOTE — CV Procedure (Signed)
INDICATIONS:   The patient is 49 year old male with recent history of atrial fibrillation with rapid ventricular response.  PROCEDURE:  Informed consent was discussed including risks, benefits and alternatives for the procedure.  Risks include, but are not limited to, cough, sore throat, vomiting, nausea, somnolence, esophageal and stomach trauma or perforation, bleeding, low blood pressure, aspiration, pneumonia, infection, trauma to the teeth and death.    Patient was given sedation.  The oropharynx was anesthetized with topical lidocaine.  The transesophageal probe was inserted in the esophagus and stomach and multiple views were obtained.  Agitated saline was used to detect PFO or ASD. Then the transesophageal probe was removed from the body.  The patient was kept under observation until the patient left the procedure room.  The patient left the procedure room in stable condition.   COMPLICATIONS:  There were no immediate complications.  FINDINGS:  1. LEFT VENTRICLE: The left ventricle is normal in structure and has moderate systolic dysfunction.  Wall motion showed generalized hypokinesia with 35% EF.  No thrombus or masses seen in the left ventricle.  2. RIGHT VENTRICLE:  The right ventricle is normal in structure and function without any thrombus or masses.    3. LEFT ATRIUM:  The left atrium is dilated without any thrombus or masses. Pulmonary veins were visualized.  4. LEFT ATRIAL APPENDAGE:  The left atrial appendage has small apical thrombus or mass.  5. RIGHT ATRIUM:  The right atrium is free of any thrombus or masses.    6. ATRIAL SEPTUM:  The atrial septum is normal without any ASD or PFO.  7. MITRAL VALVE:  The mitral valve is normal in structure and function with moderate eccentric regurgitation. No masses, stenosis or vegetations.  8. TRICUSPID VALVE:  The tricuspid valve is normal in structure and function with mild regurgitation. No masses, stenosis or  vegetations.  9. AORTIC VALVE:  The aortic valve is normal in structure and function without regurgitation, masses, stenosis or vegetations.   10. PULMONIC VALVE:  The pulmonic valve is normal in structure and function without regurgitation, masses, stenosis or vegetations.  11. AORTIC ARCH, ASCENDING AND DESCENDING AORTA:  The aorta had mild atherosclerosis in the ascending or descending aorta.  The aortic arch was normal.  IMPRESSION:   1. Moderate LV systolic dysfunction. 2. Mild TR and moderate MR. 3. Small left atrial appendage clot.  RECOMMENDATIONS:    Postpone cardioversion for at least 1 month and use Eliquis for now.
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
## 2014-08-01 NOTE — Progress Notes (Signed)
Spoke with Dr.Vann about the MRI results to rule out CVA states not pursuing stroke work up.

## 2014-08-01 NOTE — Transfer of Care (Signed)
Immediate Anesthesia Transfer of Care Note  Patient: [name]  Procedure(s) Performed: Procedure(s): TRANSESOPHAGEAL ECHOCARDIOGRAM (TEE) (N/A) CARDIOVERSION (N/A)  Patient Location: PACU and Endoscopy Unit  Anesthesia Type:MAC  Level of Consciousness: awake  Airway & Oxygen Therapy: Patient Spontanous Breathing and Patient connected to nasal cannula oxygen  Post-op Assessment: Report given to RN and Post -op Vital signs reviewed and stable  Post vital signs: Reviewed and stable  Last Vitals:  Filed Vitals:   08/01/14 0734  BP: 112/83  Pulse: 112  Temp: 36.7 C  Resp: 18    Complications: No apparent anesthesia complications

## 2014-08-01 NOTE — H&P (View-Only) (Signed)
TEE with cardioversion postponed for tomorrow due to lack of staffing/time slot.

## 2014-08-01 NOTE — Interval H&P Note (Signed)
History and Physical Interval Note:  08/01/2014 8:17 AM  [name]  has presented today for surgery, with the diagnosis of Atrial fibrillation  The various methods of treatment have been discussed with the patient and family. After consideration of risks, benefits and other options for treatment, the patient has consented to  Procedure(s): TRANSESOPHAGEAL ECHOCARDIOGRAM (TEE) (N/A) CARDIOVERSION (N/A) as a surgical intervention .  The patient's history has been reviewed, patient examined, no change in status, stable for surgery.  I have reviewed the patient's chart and labs.  Questions were answered to the patient's satisfaction.     [name] S

## 2014-08-01 NOTE — Anesthesia Postprocedure Evaluation (Signed)
  Anesthesia Post-op Note  Patient: [name]  Procedure(s) Performed: Procedure(s): TRANSESOPHAGEAL ECHOCARDIOGRAM (TEE) (N/A) CARDIOVERSION (N/A)  Patient Location: Endoscopy Unit  Anesthesia Type:General  Level of Consciousness: awake, alert  and oriented  Airway and Oxygen Therapy: Patient Spontanous Breathing  Post-op Pain: none  Post-op Assessment: Post-op Vital signs reviewed, Patient's Cardiovascular Status Stable, Respiratory Function Stable and Patent Airway              Post-op Vital Signs: stable  Last Vitals:  Filed Vitals:   08/01/14 1003  BP: 122/84  Pulse: 90  Temp:   Resp:     Complications: No apparent anesthesia complications

## 2014-08-02 MED ORDER — METOPROLOL TARTRATE 50 MG PO TABS
50.0000 mg | ORAL_TABLET | Freq: Once | ORAL | Status: AC
  Administered 2014-08-02: 50 mg via ORAL
  Filled 2014-08-02: qty 1

## 2014-08-02 MED ORDER — METOPROLOL TARTRATE 50 MG PO TABS
50.0000 mg | ORAL_TABLET | Freq: Two times a day (BID) | ORAL | Status: DC
  Administered 2014-08-02 – 2014-08-03 (×2): 50 mg via ORAL
  Filled 2014-08-02 (×2): qty 1

## 2014-08-02 MED ORDER — AMIODARONE HCL 200 MG PO TABS
400.0000 mg | ORAL_TABLET | Freq: Two times a day (BID) | ORAL | Status: DC
  Administered 2014-08-02 – 2014-08-03 (×3): 400 mg via ORAL
  Filled 2014-08-02 (×4): qty 2

## 2014-08-02 NOTE — Evaluation (Signed)
Physical Therapy Evaluation and Discharge Patient Details Name: Alphonse Asbridge MRN: [id number] DOB: 15-Nov-1965 Today's Date: 08/02/2014   History of Present Illness  Patient is a 49 year old male with atrial fibrillation on eliquis, chronic systolic CHF, alcohol abuse, nicotine abuse , anxiety, depression presented to ED with chest tightness and palpitations.  Underwent TEE on 08/01/14 which showed apical clot and per chart had left-sided weakness afterward. MRI negative for acute stroke.  Clinical Impression  Patient independent with all mobililty.  No apparent balance deficits and no sign of left-sided weakness.  No further PT needs identified, will sign off.    Follow Up Recommendations No PT follow up    Equipment Recommendations  None recommended by PT    Recommendations for Other Services       Precautions / Restrictions Precautions Precautions: Fall      Mobility  Bed Mobility Overal bed mobility: Independent                Transfers Overall transfer level: Independent                  Ambulation/Gait Ambulation/Gait assistance: Independent Ambulation Distance (Feet): 250 Feet Assistive device: None Gait Pattern/deviations: WFL(Within Functional Limits)     General Gait Details: ambulated at varying speeds, quick stops, backwards and quick turns with no loss of balance  Stairs            Wheelchair Mobility    Modified Rankin (Stroke Patients Only)       Balance Overall balance assessment: No apparent balance deficits (not formally assessed)                                           Pertinent Vitals/Pain Pain Assessment: No/denies pain    Home Living Family/patient expects to be discharged to:: Private residence Living Arrangements: Other relatives (states he lives with uncle) Available Help at Discharge:  (limited assistance available) Type of Home: House Home Access: Ramped entrance     Home Layout: One  level Home Equipment: None      Prior Function Level of Independence: Independent               Hand Dominance        Extremity/Trunk Assessment   Upper Extremity Assessment: Overall WFL for tasks assessed           Lower Extremity Assessment: Overall WFL for tasks assessed      Cervical / Trunk Assessment: Normal  Communication   Communication: No difficulties  Cognition Arousal/Alertness: Awake/alert Behavior During Therapy: WFL for tasks assessed/performed Overall Cognitive Status: Within Functional Limits for tasks assessed                      General Comments      Exercises        Assessment/Plan    PT Assessment Patent does not need any further PT services  PT Diagnosis Generalized weakness   PT Problem List    PT Treatment Interventions     PT Goals (Current goals can be found in the Care Plan section) Acute Rehab PT Goals PT Goal Formulation: All assessment and education complete, DC therapy    Frequency     Barriers to discharge        Co-evaluation               End of  Session   Activity Tolerance: Patient tolerated treatment well Patient left: in bed;with call bell/phone within reach;with bed alarm set           Time: 5501-5868 PT Time Calculation (min) (ACUTE ONLY): 19 min   Charges:   PT Evaluation $Initial PT Evaluation Tier I: 1 Procedure     PT G CodesOlivia Canter 08/02/2014, 10:58 AM  08/02/2014 Corlis Hove, PT [phone]
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
## 2014-08-02 NOTE — Progress Notes (Signed)
[facility]                                                                              Patient Demographics  [name], is a 49 y.o. male, DOB - September 29, 1965, EBR:[id number]  Admit date - 07/30/2014   Admitting Physician Ripudeep Jenna Luo, MD  Outpatient Primary MD for the patient is Valera Castle, MD  LOS - 3   Chief Complaint  Patient presents with  . Atrial Fibrillation       Brief HPI   Patient is a 49 year old male with atrial fibrillation on eliquis, chronic systolic CHF, alcohol abuse, nicotine abuse , anxiety, depression presented to ED with chest tightness and palpitations. [facility] when he started having palpitations and chest tightness. Patient also reported that he was recently started on new medications for his heart however due to side effects he stopped all of them except eliquis. He reports that his cardiologist, Dr. Sharyn Lull was planning on doing ablation on him. He reports nausea and dizziness but no vomiting, abdominal pain, fevers or chills or any productive cough. In the ER patient was noted to have a heart rate in 130s to 140s, with atrial fibrillation and RVR with BP low in 80s. Patient's cardiologist, Dr Sharyn Lull was consulted and requested [facility] to admit the patient. BNP 780, troponin 0.01, elevated LFTs, potassium 5.2, CBC unremarkable    Assessment & Plan    Principal Problem: Atrial fibrillation with RVR: Likely due to severe noncompliance, heart rate still not well controlled, currently in 100-130  - TEE done but no cardioversion as patient had Small left atrial appendage clot. - recent echo which showed markedly depressed LV systolic function - low-dose beta blockers and digoxin. -not on IV amiodarone to convert him in view of noncompliance to anticoagulate, however heart rate not controlled, defer to cardiology   Serial cardiac enzymes negative, TSH 1.0  C/o left leg  numbness/dizziness: Resolved  - Patient had complained of left leg and numbness and haziness transiently after returning from TEE - PT evaluation was done and showed no balance deficits, no sign of left-sided weakness, no PT follow-up needed   Alcoholism /alcohol abuse - Patient reports that he quit drinking alcohol a month ago, on June 1 monitor closely for any alcohol withdrawals  Nicotine use - Again patient reported that he quit smoking on June 1.    Anxiety and depression -Currently stable, place on Ativan as needed for anxiety, no suicide ideations at this time  Chronic severe systolic CHF  - stable  Code Status: Full code   Family Communication: Discussed in detail with the patient, all imaging results, lab results explained to the patient   Disposition Plan: Awaiting cardiology recommendations, heart rate still not well controlled   Time Spent in minutes    Procedures  2-D echo   TEE  Consults   Cardiology   DVT Prophylaxis  eliquis  Medications  Scheduled Meds: . apixaban  5 mg Oral BID  . digoxin  0.25 mg Oral Daily  . metoprolol tartrate  25 mg Oral BID  .  predniSONE  10 mg Oral Q breakfast  . sodium chloride  3 mL Intravenous Q12H   Continuous Infusions:  PRN Meds:.acetaminophen **OR** acetaminophen, albuterol, alum & mag hydroxide-simeth, guaiFENesin-dextromethorphan, HYDROcodone-acetaminophen, HYDROmorphone (DILAUDID) injection, ipratropium, LORazepam, metoprolol, ondansetron **OR** ondansetron (ZOFRAN) IV   Antibiotics   Anti-infectives    None        Subjective:   Gershom Brobeck was seen and examined today. Patient denies dizziness, chest pain, shortness of breath, abdominal pain, N/V/D/C, new weakness, numbess, tingling. No acute events overnight.    Objective:   Blood pressure 157/113, pulse 131, temperature 97.7 F (36.5 C), temperature source Oral, resp. rate 18, height 5\' 6"  (1.676 m), weight 80.105 kg (176 lb 9.6 oz),  SpO2 99 %.  Wt Readings from Last 3 Encounters:  08/02/14 80.105 kg (176 lb 9.6 oz)  07/08/14 79.47 kg (175 lb 3.2 oz)  10/19/13 91.627 kg (202 lb)     Intake/Output Summary (Last 24 hours) at 08/02/14 1304 Last data filed at 08/02/14 0800  Gross per 24 hour  Intake      0 ml  Output   2550 ml  Net  -2550 ml    Exam  General: Alert and oriented x 3, NAD  HEENT:  PERRLA, EOMI, Anicteric Sclera, mucous membranes moist.   Neck: Supple, no JVD, no masses  CVS:  irregularly irregular tachycardia  Respiratory : Clear to auscultation bilaterally, no wheezing, rales or rhonchi  Abdomen: Soft, nontender, nondistended, + bowel sounds  Ext: no cyanosis clubbing or edema  Neuro: AAOx3, Cr N's II- XII. Strength 5/5 upper and lower extremities bilaterally  Skin: No rashes  Psych: Normal affect and demeanor, alert and oriented x3    Data Review   Micro Results Recent Results (from the past 240 hour(s))  MRSA PCR Screening     Status: None   Collection Time: 07/30/14  6:36 PM  Result Value Ref Range Status   MRSA by PCR NEGATIVE NEGATIVE Final    Comment:        The GeneXpert MRSA Assay (FDA approved for NASAL specimens only), is one component of a comprehensive MRSA colonization surveillance program. It is not intended to diagnose MRSA infection nor to guide or monitor treatment for MRSA infections.     Radiology Reports Mr Brain Wo Contrast  08/01/2014   CLINICAL DATA:  Dizziness. Weakness in the legs. Atrial fibrillation.  EXAM: MRI HEAD WITHOUT CONTRAST  TECHNIQUE: Multiplanar, multiecho pulse sequences of the brain and surrounding structures were obtained without intravenous contrast.  COMPARISON:  CT head 08/23/2013.  FINDINGS: The patient was unable to remain motionless for the exam. Small or subtle lesions could be overlooked.  No evidence for acute infarction, hemorrhage, mass lesion, hydrocephalus, or extra-axial fluid. Premature global atrophy. Moderately  extensive T2 and FLAIR hyperintensity throughout the periventricular and subcortical white matter, likely chronic microvascular ischemic change.  Pituitary, pineal, and cerebellar tonsils unremarkable. No upper cervical lesions. Flow voids are maintained throughout the carotid, basilar, and vertebral arteries. There are no areas of chronic hemorrhage.  Visualized calvarium, skull base, and upper cervical osseous structures unremarkable. Scalp and extracranial soft tissues, orbits, sinuses, and mastoids show no acute process.  IMPRESSION: Motion degraded examination demonstrating no evidence for acute stroke.  Moderately advanced atrophy and chronic microvascular ischemic change, both premature for age.   Electronically Signed   By: Elsie Stain M.D.   On: 08/01/2014 15:05   Nm Hepatobiliary Liver Func  07/03/2014   CLINICAL DATA:  Gallbladder  wall thickening on ultrasound. No gallstones.  EXAM: NUCLEAR MEDICINE HEPATOBILIARY IMAGING  TECHNIQUE: Sequential images of the abdomen were obtained out to 60 minutes following intravenous administration of radiopharmaceutical.  RADIOPHARMACEUTICALS:  5.1 Tc-75m Choletec IV  COMPARISON:  None.  FINDINGS: There is prompt uptake and excretion of the radiopharmaceutical by the liver. There is prompt and increasing activity in the gallbladder. There is activity in central extrahepatic bile ducts and small bowel before 1 hour.  IMPRESSION: 1. Patency of cystic and common bile ducts.   Electronically Signed   By: Corlis Leak M.D.   On: 07/03/2014 16:11   Dg Chest Port 1 View  07/30/2014   CLINICAL DATA:  Atrial fibrillation.  Noncompliance with medication.  EXAM: PORTABLE CHEST - 1 VIEW  COMPARISON:  07/02/2014.  FINDINGS: Enlargement of the cardiopericardial silhouette. Interstitial edema at the lung bases. No airspace consolidation or pleural effusion. Monitoring leads project over the chest. Calcifications at the LEFT hilum which could be vascular or old granulomatous  disease.  IMPRESSION: Cardiomegaly and basilar interstitial pulmonary edema.   Electronically Signed   By: Andreas Newport M.D.   On: 07/30/2014 13:37    CBC  Recent Labs Lab 07/30/14 1408 07/31/14 0622 08/01/14 0340  WBC 5.9 6.0 7.0  HGB 15.0 14.7 15.9  HCT 44.0 42.8 46.2  PLT 152 152 147*  MCV 95.0 94.1 93.9  MCH 32.4 32.3 32.3  MCHC 34.1 34.3 34.4  RDW 13.4 13.4 13.4    Chemistries   Recent Labs Lab 07/30/14 1408 07/30/14 1524 07/31/14 0622 07/31/14 0833 08/01/14 0340  NA 135  --  136 136 136  K 5.2*  --  4.4 4.8 4.2  CL 105  --  106 106 104  CO2 19*  --  GLUCOSE 98  --  91 88 96  BUN 17  --  CREATININE 1.21  --  1.17 1.18 1.04  CALCIUM 8.6*  --  8.8* 8.8* 8.2*  AST  --  176*  --   --   --   ALT  --  130*  --   --   --   ALKPHOS  --  88  --   --   --   BILITOT  --  1.0  --   --   --    ------------------------------------------------------------------------------------------------------------------ estimated creatinine clearance is 85.4 mL/min (by C-G formula based on Cr of 1.04). ------------------------------------------------------------------------------------------------------------------ No results for input(s): HGBA1C in the last 72 hours. ------------------------------------------------------------------------------------------------------------------ No results for input(s): CHOL, HDL, LDLCALC, TRIG, CHOLHDL, LDLDIRECT in the last 72 hours. ------------------------------------------------------------------------------------------------------------------  Recent Labs  07/30/14 2321  TSH 1.012   ------------------------------------------------------------------------------------------------------------------ No results for input(s): VITAMINB12, FOLATE, FERRITIN, TIBC, IRON, RETICCTPCT in the last 72 hours.  Coagulation profile  Recent Labs Lab 07/30/14 1408 07/31/14 0833  INR 1.38 1.47    No results for input(s): DDIMER  in the last 72 hours.  Cardiac Enzymes  Recent Labs Lab 07/30/14 1524 07/30/14 2321 07/31/14 0622  TROPONINI 0.03 <0.03 0.03   ------------------------------------------------------------------------------------------------------------------ Invalid input(s): POCBNP  No results for input(s): GLUCAP in the last 72 hours.   RAI,RIPUDEEP M.D. [facility] 08/02/2014, 1:04 PM  Pager: [phone]   Between 7am to 7pm - call Pager - [phone]  After 7pm go to [email or web address] - password [email or web address]  Call night coverage person covering after 7pm

## 2014-08-03 DIAGNOSIS — I4891 Unspecified atrial fibrillation: Secondary | ICD-10-CM | POA: Insufficient documentation

## 2014-08-03 MED ORDER — DIGOXIN 250 MCG PO TABS: 0.2500 mg | ORAL_TABLET | Freq: Every day | ORAL | Status: DC

## 2014-08-03 MED ORDER — AMIODARONE HCL 200 MG PO TABS: 400.0000 mg | ORAL_TABLET | Freq: Two times a day (BID) | ORAL | Status: DC

## 2014-08-03 MED ORDER — APIXABAN 5 MG PO TABS: 5.0000 mg | ORAL_TABLET | Freq: Two times a day (BID) | ORAL | Status: DC

## 2014-08-03 MED ORDER — METOPROLOL TARTRATE 50 MG PO TABS: 50.0000 mg | ORAL_TABLET | Freq: Two times a day (BID) | ORAL | Status: DC

## 2014-08-03 MED ORDER — OFF THE BEAT BOOK
Freq: Once | Status: DC
  Filled 2014-08-03: qty 1

## 2014-08-03 NOTE — Consult Note (Signed)
Ref: Valera Castle, MD   Subjective:  Heart rate improved. Atrial fibrillation continues.   Objective:  Vital Signs in the last 24 hours: Temp:  [97.7 F (36.5 C)-98.2 F (36.8 C)] 97.8 F (36.6 C) (07/03 0429) Pulse Rate:  [71-131] 71 (07/03 0429) Cardiac Rhythm:  [-] Atrial fibrillation (07/02 2040) Resp:  [16-18] 16 (07/03 0429) BP: (106-157)/(72-113) 106/90 mmHg (07/03 0429) SpO2:  [93 %-99 %] 95 % (07/03 0429) Weight:  [80.105 kg (176 lb 9.6 oz)] 80.105 kg (176 lb 9.6 oz) (07/03 0429)  Physical Exam: BP Readings from Last 1 Encounters:  08/03/14 106/90    Wt Readings from Last 1 Encounters:  08/03/14 80.105 kg (176 lb 9.6 oz)    Weight change: 0 kg (0 lb)  HEENT: /AT, Eyes-PERL, EOMI, Conjunctiva-Pink, Sclera-Non-icteric Neck: No JVD, No bruit, Trachea midline. Lungs:  Clear, Bilateral. Cardiac:  Irregular rhythm, normal S1 and S2, no S3. II/VI systolic murmur. Abdomen:  Soft, non-tender. Extremities:  No edema present. No cyanosis. No clubbing. CNS: AxOx3, Cranial nerves grossly intact, moves all 4 extremities. Right handed. Skin: Warm and dry.   Intake/Output from previous day: 07/02 0701 - 07/03 0700 In: 240 [P.O.:240] Out: 1150 [Urine:1150]    Lab Results: BMET    Component Value Date/Time   NA 136 08/01/2014 0340   NA 136 07/31/2014 0833   NA 136 07/31/2014 0622   K 4.2 08/01/2014 0340   K 4.8 07/31/2014 0833   K 4.4 07/31/2014 0622   CL 104 08/01/2014 0340   CL 106 07/31/2014 0833   CL 106 07/31/2014 0622   CO2 22 08/01/2014 0340   CO2 24 07/31/2014 0833   CO2 24 07/31/2014 0622   GLUCOSE 96 08/01/2014 0340   GLUCOSE 88 07/31/2014 0833   GLUCOSE 91 07/31/2014 0622   BUN 17 08/01/2014 0340   BUN 19 07/31/2014 0833   BUN 19 07/31/2014 0622   CREATININE 1.04 08/01/2014 0340   CREATININE 1.18 07/31/2014 0833   CREATININE 1.17 07/31/2014 0622   CALCIUM 8.2* 08/01/2014 0340   CALCIUM 8.8* 07/31/2014 0833   CALCIUM 8.8* 07/31/2014 0622    GFRNONAA >60 08/01/2014 0340   GFRNONAA >60 07/31/2014 0833   GFRNONAA >60 07/31/2014 0622   GFRAA >60 08/01/2014 0340   GFRAA >60 07/31/2014 0833   GFRAA >60 07/31/2014 0622   CBC    Component Value Date/Time   WBC 7.0 08/01/2014 0340   RBC 4.92 08/01/2014 0340   HGB 15.9 08/01/2014 0340   HCT 46.2 08/01/2014 0340   PLT 147* 08/01/2014 0340   MCV 93.9 08/01/2014 0340   MCH 32.3 08/01/2014 0340   MCHC 34.4 08/01/2014 0340   RDW 13.4 08/01/2014 0340   LYMPHSABS 1.3 07/07/2014 0440   MONOABS 1.1* 07/07/2014 0440   EOSABS 0.0 07/07/2014 0440   BASOSABS 0.0 07/07/2014 0440   HEPATIC Function Panel  Recent Labs  07/05/14 0346 07/06/14 0408 07/30/14 1524  PROT 5.6* 5.6* 5.6*   HEMOGLOBIN A1C No components found for: HGA1C,  MPG CARDIAC ENZYMES Lab Results  Component Value Date   CKTOTAL 129 09/13/2009   CKMB 2.4 09/13/2009   TROPONINI 0.03 07/31/2014   TROPONINI <0.03 07/30/2014   TROPONINI 0.03 07/30/2014   BNP No results for input(s): PROBNP in the last 8760 hours. TSH  Recent Labs  07/03/14 0014 07/30/14 2321  TSH 3.175 1.012   CHOLESTEROL No results for input(s): CHOL in the last 8760 hours.  Scheduled Meds: . amiodarone  400 mg Oral BID  .  apixaban  5 mg Oral BID  . digoxin  0.25 mg Oral Daily  . metoprolol tartrate  50 mg Oral BID  . off the beat book   Does not apply Once  . predniSONE  10 mg Oral Q breakfast  . sodium chloride  3 mL Intravenous Q12H   Continuous Infusions:  PRN Meds:.acetaminophen **OR** acetaminophen, albuterol, alum & mag hydroxide-simeth, guaiFENesin-dextromethorphan, HYDROcodone-acetaminophen, HYDROmorphone (DILAUDID) injection, ipratropium, LORazepam, metoprolol, ondansetron **OR** ondansetron (ZOFRAN) IV  Assessment/Plan: Atypical chest pain MI ruled out Severe nonischemic dilated card myopathy Mild TR Moderate MR Mild decompensated acute systolic heart failure A. fib with CVR, CHA2DS2VASc score of  2/9 Hypertension History of focal abuse History of tobacco abuse Anxiety disorder Depression  Decrease amiodarone to 200 mg. daily after one week. May have to discontinue digoxin in 1-2 weeks. Continue Eliquis F/U Dr. Sharyn Lull in 2 weeks. Possible cardioversion in 1 month.     LOS: 4 days    Orpah Cobb  MD  08/03/2014, 10:32 AM

## 2014-08-03 NOTE — Discharge Summary (Signed)
Physician Discharge Summary   Patient ID: [name] MRN: [id number] DOB/AGE: [birth year] 49 y.o.  Admit date: 07/30/2014 Discharge date: 08/03/2014  Primary Care Physician:  Valera Castle, MD  Discharge Diagnoses:    . Atrial fibrillation with RVR . Hypotension . Chest pain . Anxiety and depression . Alcoholism /alcohol abuse Medical noncompliance   Consults: Cardiology, Dr. Sharyn Lull  Recommendations for Outpatient Follow-up:  Patient started on amiodarone, 400 mg twice a day for one week and then decrease to 200 mg daily  TESTS THAT NEED FOLLOW-UP LFTs, TFTs, while on amiodarone and PFTs if any shortness of breath   DIET: Heart healthy diet   Allergies:   Allergies  Allergen Reactions  . Bee Venom Anaphylaxis    Uses epi pen-- last used 08/23/11     Discharge Medications:   Medication List    STOP taking these medications        aspirin 325 MG tablet      TAKE these medications        albuterol 108 (90 BASE) MCG/ACT inhaler  Commonly known as:  PROVENTIL HFA;VENTOLIN HFA  Inhale 2 puffs into the lungs every 6 (six) hours as needed for wheezing or shortness of breath.     amiodarone 200 MG tablet  Commonly known as:  PACERONE  Take 2 tablets (400 mg total) by mouth 2 (two) times daily. Please take 400 mg (2 tablets) twice a day for one week, then drop down to 200 mg (one tablet) once daily     apixaban 5 MG Tabs tablet  Commonly known as:  ELIQUIS  Take 1 tablet (5 mg total) by mouth 2 (two) times daily.     digoxin 0.25 MG tablet  Commonly known as:  LANOXIN  Take 1 tablet (0.25 mg total) by mouth daily.     hydrOXYzine 25 MG capsule  Commonly known as:  VISTARIL  Take 25 mg by mouth 4 (four) times daily as needed for anxiety.     lisinopril 2.5 MG tablet  Commonly known as:  PRINIVIL,ZESTRIL  Take 1 tablet (2.5 mg total) by mouth daily.     MEDICATED CHEST RUB 4.73-1.2-2.6 % Oint  Apply 1 application topically daily as needed (congestion).      metoprolol 50 MG tablet  Commonly known as:  LOPRESSOR  Take 1 tablet (50 mg total) by mouth 2 (two) times daily.     predniSONE 10 MG tablet  Commonly known as:  DELTASONE  Take 1 tablet (10 mg total) by mouth daily with breakfast.     sertraline 50 MG tablet  Commonly known as:  ZOLOFT  Take 50 mg by mouth daily.         Brief H and P: For complete details please refer to admission H and P, but in brief Patient is a 49 year old male with atrial fibrillation on eliquis, chronic systolic CHF, alcohol abuse, nicotine abuse , anxiety, depression presented to ED with chest tightness and palpitations. [facility] when he started having palpitations and chest tightness. Patient also reported that he was recently started on new medications for his heart however due to side effects he stopped all of them except eliquis. He reports that his cardiologist, Dr. Sharyn Lull was planning on doing ablation on him. He reports nausea and dizziness but no vomiting, abdominal pain, fevers or chills or any productive cough. In the ER patient was noted to have a heart rate in 130s to  140s, with atrial fibrillation and RVR with BP low in 80s. Patient's cardiologist, Dr Sharyn Lull was consulted and requested [facility] to admit the patient. BNP 780, troponin 0.01, elevated LFTs, potassium 5.2, CBC unremarkable   Hospital Course:  Atrial fibrillation with RVR: Likely due to severe noncompliance Patient was admitted to stepdown unit on IV Cardizem drip with no significant improvement in his heart rate. Cardiology was consulted, due to depressed EF, cardiology discontinued cardizem and TEE was done in anticipation of possibly cardioverting him. However TEE showed a small left atrial appendage clot hence no cardioversion was done.  He had stopped all his cardiac medications outpatient prior to admission by himself. Recent 2-D echo showed markedly depressed LV systolic  function. He was placed on low-dose beta blocker and oxygen. However heart rate was still not well controlled, hence patient was placed on amiodarone. His heart rate is now much better controlled, hence cardiology, Dr Algie Coffer recommended amiodarone 400 mg BID x 1 week, then decrease to 200 mg daily. Dr. Sharyn Lull to decide on digoxin at the follow-up appointment in 7-10 days. Serial cardiac enzymes were negative, TSH 1.0. digoxin and    C/o left leg numbness/dizziness: Resolved  - Patient had complained of left leg and numbness and haziness transiently after returning from TEE - PT evaluation was done and showed no balance deficits, no sign of left-sided weakness, no PT follow-up needed   Alcoholism /alcohol abuse - Patient reports that he quit drinking alcohol a month ago, on June 1 monitor closely for any alcohol withdrawals  Nicotine use - Again patient reported that he quit smoking on June 1.    Anxiety and depression -Currently stable, place on Ativan as needed for anxiety, no suicide ideations at this time  Chronic severe systolic CHF  - stable   Day of Discharge BP 106/90 mmHg  Pulse 71  Temp(Src) 97.8 F (36.6 C) (Oral)  Resp 16  Ht 5\' 6"  (1.676 m)  Wt 80.105 kg (176 lb 9.6 oz)  BMI 28.52 kg/m2  SpO2 95%  Physical Exam: General: Alert and awake oriented x3 not in any acute distress. HEENT: anicteric sclera, pupils reactive to light and accommodation CVS: S1-S2 clear no murmur rubs or gallops Chest: clear to auscultation bilaterally, no wheezing rales or rhonchi Abdomen: soft nontender, nondistended, normal bowel sounds Extremities: no cyanosis, clubbing or edema noted bilaterally Neuro: Cranial nerves II-XII intact, no focal neurological deficits   The results of significant diagnostics from this hospitalization (including imaging, microbiology, ancillary and laboratory) are listed below for reference.    LAB RESULTS: Basic Metabolic Panel:  Recent Labs Lab  07/31/14 0833 08/01/14 0340  NA 136 136  K 4.8 4.2  CL 106 104  CO2 24 22  GLUCOSE 88 96  BUN 19 17  CREATININE 1.18 1.04  CALCIUM 8.8* 8.2*   Liver Function Tests:  Recent Labs Lab 07/30/14 1524  AST 176*  ALT 130*  ALKPHOS 88  BILITOT 1.0  PROT 5.6*  ALBUMIN 3.1*    Recent Labs Lab 07/30/14 1524  LIPASE 17*   No results for input(s): AMMONIA in the last 168 hours. CBC:  Recent Labs Lab 07/31/14 0622 08/01/14 0340  WBC 6.0 7.0  HGB 14.7 15.9  HCT 42.8 46.2  MCV 94.1 93.9  PLT 152 147*   Cardiac Enzymes:  Recent Labs Lab 07/30/14 2321 07/31/14 0622  TROPONINI <0.03 0.03   BNP: Invalid input(s): POCBNP CBG: No results for input(s): GLUCAP in the last 168 hours.  Significant  Diagnostic Studies:  Dg Chest Port 1 View  07/30/2014   CLINICAL DATA:  Atrial fibrillation.  Noncompliance with medication.  EXAM: PORTABLE CHEST - 1 VIEW  COMPARISON:  07/02/2014.  FINDINGS: Enlargement of the cardiopericardial silhouette. Interstitial edema at the lung bases. No airspace consolidation or pleural effusion. Monitoring leads project over the chest. Calcifications at the LEFT hilum which could be vascular or old granulomatous disease.  IMPRESSION: Cardiomegaly and basilar interstitial pulmonary edema.   Electronically Signed   By: Andreas Newport M.D.   On: 07/30/2014 13:37    2D ECHO:   Disposition and Follow-up:     Discharge Instructions    Diet - low sodium heart healthy    Complete by:  As directed      Discharge instructions    Complete by:  As directed   Please take amiodarone 400 mg twice a day (2 tablets twice a day) for one week then taper to 200 mg (one tablet) once daily.     Increase activity slowly    Complete by:  As directed             DISPOSITION: Home   DISCHARGE FOLLOW-UP Follow-up Information    Follow up with Rinaldo Cloud, MD. Schedule an appointment as soon as possible for a visit in 10 days.   Specialty:  Cardiology    Why:  for hospital follow-up   Contact information:   [address] [place] [address] [phone]        Time spent on Discharge: 35 mins   Signed:   Lakeva Hollon M.D. [facility] 08/03/2014, 12:07 PM Pager: [phone]

## 2014-08-05 ENCOUNTER — Encounter (HOSPITAL_COMMUNITY): Payer: Self-pay | Admitting: Cardiovascular Disease

## 2014-10-18 ENCOUNTER — Encounter (HOSPITAL_COMMUNITY): Payer: Self-pay | Admitting: Emergency Medicine

## 2014-10-18 ENCOUNTER — Inpatient Hospital Stay (HOSPITAL_COMMUNITY)
Admission: EM | Admit: 2014-10-18 | Discharge: 2014-10-20 | DRG: 309 | Disposition: A | Discharge status: Home / Self Care | Payer: Self-pay | Attending: Internal Medicine | Admitting: Internal Medicine

## 2014-10-18 ENCOUNTER — Emergency Department (HOSPITAL_COMMUNITY): Payer: Self-pay

## 2014-10-18 DIAGNOSIS — I4891 Unspecified atrial fibrillation: Secondary | ICD-10-CM | POA: Diagnosis present

## 2014-10-18 DIAGNOSIS — Z590 Homelessness: Secondary | ICD-10-CM

## 2014-10-18 DIAGNOSIS — F1010 Alcohol abuse, uncomplicated: Secondary | ICD-10-CM | POA: Diagnosis present

## 2014-10-18 DIAGNOSIS — I340 Nonrheumatic mitral (valve) insufficiency: Secondary | ICD-10-CM | POA: Diagnosis present

## 2014-10-18 DIAGNOSIS — F419 Anxiety disorder, unspecified: Secondary | ICD-10-CM | POA: Diagnosis present

## 2014-10-18 DIAGNOSIS — Z7901 Long term (current) use of anticoagulants: Secondary | ICD-10-CM

## 2014-10-18 DIAGNOSIS — Z79899 Other long term (current) drug therapy: Secondary | ICD-10-CM

## 2014-10-18 DIAGNOSIS — F32A Depression, unspecified: Secondary | ICD-10-CM | POA: Diagnosis present

## 2014-10-18 DIAGNOSIS — I480 Paroxysmal atrial fibrillation: Principal | ICD-10-CM | POA: Diagnosis present

## 2014-10-18 DIAGNOSIS — R200 Anesthesia of skin: Secondary | ICD-10-CM | POA: Diagnosis present

## 2014-10-18 DIAGNOSIS — I10 Essential (primary) hypertension: Secondary | ICD-10-CM | POA: Diagnosis present

## 2014-10-18 DIAGNOSIS — Z91030 Bee allergy status: Secondary | ICD-10-CM

## 2014-10-18 DIAGNOSIS — R109 Unspecified abdominal pain: Secondary | ICD-10-CM | POA: Diagnosis present

## 2014-10-18 DIAGNOSIS — G459 Transient cerebral ischemic attack, unspecified: Secondary | ICD-10-CM

## 2014-10-18 DIAGNOSIS — F329 Major depressive disorder, single episode, unspecified: Secondary | ICD-10-CM | POA: Diagnosis present

## 2014-10-18 DIAGNOSIS — F10220 Alcohol dependence with intoxication, uncomplicated: Secondary | ICD-10-CM | POA: Diagnosis present

## 2014-10-18 DIAGNOSIS — Z9114 Patient's other noncompliance with medication regimen: Secondary | ICD-10-CM | POA: Diagnosis present

## 2014-10-18 DIAGNOSIS — F17210 Nicotine dependence, cigarettes, uncomplicated: Secondary | ICD-10-CM | POA: Diagnosis present

## 2014-10-18 DIAGNOSIS — Z7952 Long term (current) use of systemic steroids: Secondary | ICD-10-CM

## 2014-10-18 DIAGNOSIS — F1020 Alcohol dependence, uncomplicated: Secondary | ICD-10-CM | POA: Diagnosis present

## 2014-10-18 DIAGNOSIS — E871 Hypo-osmolality and hyponatremia: Secondary | ICD-10-CM | POA: Diagnosis present

## 2014-10-18 DIAGNOSIS — Y908 Blood alcohol level of 240 mg/100 ml or more: Secondary | ICD-10-CM | POA: Diagnosis present

## 2014-10-18 DIAGNOSIS — I959 Hypotension, unspecified: Secondary | ICD-10-CM | POA: Diagnosis present

## 2014-10-18 DIAGNOSIS — I426 Alcoholic cardiomyopathy: Secondary | ICD-10-CM | POA: Diagnosis present

## 2014-10-18 HISTORY — DX: Headache, unspecified: R51.9

## 2014-10-18 HISTORY — DX: Major depressive disorder, single episode, unspecified: F32.9

## 2014-10-18 HISTORY — DX: Anxiety disorder, unspecified: F41.9

## 2014-10-18 HISTORY — DX: Depression, unspecified: F32.A

## 2014-10-18 HISTORY — DX: Acute myocardial infarction, unspecified: I21.9

## 2014-10-18 HISTORY — DX: Personal history of other diseases of the digestive system: Z87.19

## 2014-10-18 HISTORY — DX: Headache: R51

## 2014-10-18 HISTORY — DX: Cerebral infarction, unspecified: I63.9

## 2014-10-18 LAB — CBC
HCT: 39.1 % (ref 39.0–52.0)
Hemoglobin: 13.1 g/dL (ref 13.0–17.0)
MCH: 31 pg (ref 26.0–34.0)
MCHC: 33.5 g/dL (ref 30.0–36.0)
MCV: 92.7 fL (ref 78.0–100.0)
PLATELETS: 177 10*3/uL (ref 150–400)
RBC: 4.22 MIL/uL (ref 4.22–5.81)
RDW: 14.3 % (ref 11.5–15.5)
WBC: 5.3 10*3/uL (ref 4.0–10.5)

## 2014-10-18 LAB — BASIC METABOLIC PANEL
Anion gap: 14 (ref 5–15)
BUN: 11 mg/dL (ref 6–20)
CALCIUM: 8.4 mg/dL — AB (ref 8.9–10.3)
CO2: 19 mmol/L — AB (ref 22–32)
CREATININE: 0.88 mg/dL (ref 0.61–1.24)
Chloride: 101 mmol/L (ref 101–111)
GFR calc non Af Amer: >60 mL/min (ref >60)
Glucose, Bld: 67 mg/dL (ref 65–99)
Potassium: 4.3 mmol/L (ref 3.5–5.1)
Sodium: 134 mmol/L — ABNORMAL LOW (ref 135–145)

## 2014-10-18 LAB — ETHANOL: Alcohol, Ethyl (B): 285 mg/dL — ABNORMAL HIGH (ref <5)

## 2014-10-18 LAB — RAPID URINE DRUG SCREEN, HOSP PERFORMED
AMPHETAMINES: NOT DETECTED
BARBITURATES: NOT DETECTED
BENZODIAZEPINES: POSITIVE — AB
Cocaine: NOT DETECTED
Opiates: NOT DETECTED
TETRAHYDROCANNABINOL: NOT DETECTED

## 2014-10-18 LAB — DIGOXIN LEVEL

## 2014-10-18 LAB — MAGNESIUM: MAGNESIUM: 1.8 mg/dL (ref 1.7–2.4)

## 2014-10-18 LAB — I-STAT TROPONIN, ED: TROPONIN I, POC: 0 ng/mL (ref 0.00–0.08)

## 2014-10-18 LAB — TSH: TSH: 0.774 u[IU]/mL (ref 0.350–4.500)

## 2014-10-18 MED ORDER — APIXABAN 5 MG PO TABS
5.0000 mg | ORAL_TABLET | Freq: Once | ORAL | Status: AC
  Administered 2014-10-19: 5 mg via ORAL
  Filled 2014-10-18: qty 1

## 2014-10-18 MED ORDER — DILTIAZEM HCL 100 MG IV SOLR
5.0000 mg/h | INTRAVENOUS | Status: DC
  Administered 2014-10-18: 5 mg/h via INTRAVENOUS
  Filled 2014-10-18: qty 100

## 2014-10-18 MED ORDER — SODIUM CHLORIDE 0.9 % IV SOLN
1000.0000 mL | Freq: Once | INTRAVENOUS | Status: AC
  Administered 2014-10-18: 1000 mL via INTRAVENOUS

## 2014-10-18 MED ORDER — DILTIAZEM LOAD VIA INFUSION
20.0000 mg | Freq: Once | INTRAVENOUS | Status: AC
Start: 2014-10-18 — End: 2014-10-18
  Administered 2014-10-18: 20 mg via INTRAVENOUS
  Filled 2014-10-18: qty 20

## 2014-10-18 MED ORDER — SODIUM CHLORIDE 0.9 % IV BOLUS (SEPSIS)
1000.0000 mL | Freq: Once | INTRAVENOUS | Status: AC
  Administered 2014-10-18: 1000 mL via INTRAVENOUS

## 2014-10-18 MED ORDER — SODIUM CHLORIDE 0.9 % IV SOLN
1000.0000 mL | INTRAVENOUS | Status: DC
  Administered 2014-10-18 – 2014-10-20 (×4): 1000 mL via INTRAVENOUS

## 2014-10-18 NOTE — ED Notes (Addendum)
  Per EMS: called out for seizure-like activity which actually was shown to be a behavioral disturbance.  Patient very intoxicated, EMS reports that patient consumed nineteen 40 oz beers today.    Patient was found to be in afib RVR with rates as high as 170, hx of same.  Unclear if patient has been compliant with medications.  Attempts at establishing an IV were unsuccessful with EMS en route.  Patient was combative/uncooperative during transport and 5 mg of versed had to administered intramuscularly by EMS.  Patient became hypotensive after this, EMS recorded his BP to be 87/53.  2 IV's were established upon arrival to facility, PA at bedside, normal saline bolus was started.  Patient calm and cooperative at this time.

## 2014-10-18 NOTE — ED Notes (Signed)
PA at bedside.

## 2014-10-18 NOTE — ED Provider Notes (Signed)
CSN: [id number]     Arrival date & time 10/18/14  1839 History   First MD Initiated Contact with Patient 10/18/14 1839     Chief Complaint  Patient presents with  . Atrial Fibrillation    rvr  . Alcohol Intoxication  . Agitation     (Consider location/radiation/quality/duration/timing/severity/associated sxs/prior Treatment) HPI  Patient is intoxicated at this time and unable to provide a decent history. [name] is a 49 y.o. male with PMH significant for hypertension, alcohol abuse, anxiety, tobacco abuse, and atrial fibrillation who presents with AF RVR per EMS. Per EMS, patient was found under a bridge and states he has had 19 40 ounce beers. En route he received first then. During the exam he is complaining of shortness of breath.  Past Medical History  Diagnosis Date  . Hypertension   . Alcoholism /alcohol abuse   . Anxiety and depression   . Tobacco abuse   . Atrial fibrillation    Past Surgical History  Procedure Laterality Date  . Hernia repair      UHR x2  . Tee without cardioversion N/A 08/01/2014    Procedure: TRANSESOPHAGEAL ECHOCARDIOGRAM (TEE);  Surgeon: Orpah Cobb, MD;  Location: [facility] ENDOSCOPY;  Service: Cardiovascular;  Laterality: N/A;   Family History  Problem Relation Age of Onset  . Heart attack Mother   . Heart attack Father   . Hypertension Mother   . Hypertension Father    Social History  Substance Use Topics  . Smoking status: Former Smoker -- 1.00 packs/day for 30 years    Types: Cigarettes    Quit date: 05/02/2014  . Smokeless tobacco: Never Used  . Alcohol Use: Yes     Comment: drank 19 40 oz beers today    Review of Systems  Unable to perform ROS  due to patient's condition.    Allergies  Bee venom  Home Medications   Prior to Admission medications   Medication Sig Start Date End Date Taking? Authorizing Provider  albuterol (PROVENTIL HFA;VENTOLIN HFA) 108 (90 BASE) MCG/ACT inhaler Inhale 2 puffs into the lungs every 6  (six) hours as needed for wheezing or shortness of breath. 07/08/14   Standley Brooking, MD  amiodarone (PACERONE) 200 MG tablet Take 2 tablets (400 mg total) by mouth 2 (two) times daily. Please take 400 mg (2 tablets) twice a day for one week, then drop down to 200 mg (one tablet) once daily 08/03/14   Ripudeep Jenna Luo, MD  apixaban (ELIQUIS) 5 MG TABS tablet Take 1 tablet (5 mg total) by mouth 2 (two) times daily. 08/03/14   Ripudeep Jenna Luo, MD  Camphor-Eucalyptus-Menthol (MEDICATED CHEST RUB) 4.73-1.2-2.6 % OINT Apply 1 application topically daily as needed (congestion).    Historical Provider, MD  digoxin (LANOXIN) 0.25 MG tablet Take 1 tablet (0.25 mg total) by mouth daily. 08/03/14   Ripudeep Jenna Luo, MD  hydrOXYzine (VISTARIL) 25 MG capsule Take 25 mg by mouth 4 (four) times daily as needed for anxiety.    Historical Provider, MD  lisinopril (PRINIVIL,ZESTRIL) 2.5 MG tablet Take 1 tablet (2.5 mg total) by mouth daily. 07/08/14   Standley Brooking, MD  metoprolol (LOPRESSOR) 50 MG tablet Take 1 tablet (50 mg total) by mouth 2 (two) times daily. 08/03/14   Ripudeep Jenna Luo, MD  predniSONE (DELTASONE) 10 MG tablet Take 1 tablet (10 mg total) by mouth daily with breakfast. 07/08/14   Standley Brooking, MD  sertraline (ZOLOFT) 50 MG tablet Take 50 mg  by mouth daily.    Historical Provider, MD   BP 95/81 mmHg  Temp(Src) 98.1 F (36.7 C) (Oral)  Resp 26  Ht 6' (1.829 m)  Wt 180 lb (81.647 kg)  BMI 24.41 kg/m2  SpO2 91% Physical Exam  Constitutional: He is oriented to person, place, and time. He appears well-developed and well-nourished.  HENT:  Head: Normocephalic and atraumatic.  Neck: Normal range of motion. Neck supple.  Cardiovascular: Intact distal pulses.  An irregularly irregular rhythm present. Tachycardia present.   No murmur heard. Pulmonary/Chest: Effort normal and breath sounds normal. No respiratory distress. He has no wheezes. He has no rales.  Abdominal: Soft. Bowel sounds are normal. He  exhibits no distension. There is no tenderness.  Musculoskeletal: Normal range of motion.  Neurological: He is alert and oriented to person, place, and time.  Skin: Skin is warm and dry.  No signs of trauma.     ED Course  Procedures (including critical care time) Labs Review Labs Reviewed  BASIC METABOLIC PANEL  CBC  TSH  MAGNESIUM  ETHANOL  OSMOLALITY  URINE RAPID DRUG SCREEN, HOSP PERFORMED  I-STAT TROPOININ, ED    Imaging Review Dg Chest 2 View  10/18/2014   CLINICAL DATA:  Atrial fibrillation. History of hypertension tobacco abuse and ethanol abuse.  EXAM: CHEST  2 VIEW  COMPARISON:  07/30/2014  FINDINGS: Mild to moderate enlargement of the cardiopericardial silhouette, stable. No mediastinal or hilar masses or pathologically enlarged lymph nodes.  Minor scarring in the right mid lung, stable. Lungs otherwise clear. No pleural effusion or pneumothorax.  Bony thorax is intact.  IMPRESSION: No acute cardiopulmonary disease.  Stable cardiomegaly.   Electronically Signed   By: Amie Portland M.D.   On: 10/18/2014 20:18   I have personally reviewed and evaluated these images and lab results as part of my medical decision-making.   EKG Interpretation   Date/Time:  Saturday October 18 2014 18:39:57 EDT Ventricular Rate:  135 PR Interval:    QRS Duration: 86 QT Interval:  300 QTC Calculation: 450 R Axis:   6 Text Interpretation:  Atrial fibrillation with rapid ventricular response  Inferior infarct, old Consider anterior infarct Nonspecific T wave  abnormality Abnormal ekg Since last tracing rate faster Confirmed by  MILLER  MD, BRIAN ([id number]) on 10/18/2014 8:05:13 PM      MDM   Final diagnoses:  None    Patient presents with AF RVR.  VS Blood pressure 95/81, temperature 98.1 F (36.7 C), temperature source Oral, resp. rate 26, height 6' (1.829 m), weight 180 lb (81.647 kg), SpO2 91 %.  On exam, pt is tachycardic and intoxicated.  EKG shows tachycardia, irregularly  irregular, no signs of infarction.   7:05 PM: IVF bolus, Diltiazem load and infusion   Imaging CXR.  Labs include dig level, EtOH level, osmolality, UDS, BMP, CBC, troponin, TSH, Mg. -UDS positive for BZDs -BMP, CBC, Mg, troponin, dig nl -CXR nl -Ethanol 285 -TSH nl -osmolality pending   8:49 PM: rate controlled with Diltiazem.  EKG HR upper 70s and 80s; however, still in atrial fibrillation.  BP 90s/60s, will administer 1000 mL bolus + maintenance fluids.  Will allow patient to sober up.   11:43 PM: Pt is unable to provide a history concerning his AF or his activities prior ED arrival.   -Will admit given AF RVR, persistent hypotension and fluctuating heart rate along with alcohol intoxication.  Rate is controlled; however, persistent hypotension.  Admit to hospitalist.  Case has  been discussed with and seen by Dr. Hyacinth Meeker who agrees with the above plan for admission.     Cheri Fowler, PA-C 10/19/14 0009  Eber Hong, MD 10/19/14 [phone]

## 2014-10-19 ENCOUNTER — Encounter (HOSPITAL_COMMUNITY): Payer: Self-pay

## 2014-10-19 ENCOUNTER — Inpatient Hospital Stay (HOSPITAL_COMMUNITY): Payer: Self-pay

## 2014-10-19 DIAGNOSIS — I959 Hypotension, unspecified: Secondary | ICD-10-CM

## 2014-10-19 DIAGNOSIS — I4891 Unspecified atrial fibrillation: Secondary | ICD-10-CM

## 2014-10-19 DIAGNOSIS — F1010 Alcohol abuse, uncomplicated: Secondary | ICD-10-CM | POA: Diagnosis present

## 2014-10-19 DIAGNOSIS — R208 Other disturbances of skin sensation: Secondary | ICD-10-CM

## 2014-10-19 DIAGNOSIS — G459 Transient cerebral ischemic attack, unspecified: Secondary | ICD-10-CM

## 2014-10-19 LAB — URINALYSIS, ROUTINE W REFLEX MICROSCOPIC
Bilirubin Urine: NEGATIVE
GLUCOSE, UA: NEGATIVE mg/dL
HGB URINE DIPSTICK: NEGATIVE
Ketones, ur: NEGATIVE mg/dL
LEUKOCYTES UA: NEGATIVE
Nitrite: NEGATIVE
PROTEIN: NEGATIVE mg/dL
SPECIFIC GRAVITY, URINE: 1.006 (ref 1.005–1.030)
UROBILINOGEN UA: 0.2 mg/dL (ref 0.0–1.0)
pH: 5 (ref 5.0–8.0)

## 2014-10-19 LAB — LIPID PANEL
CHOL/HDL RATIO: 3.7 ratio
CHOLESTEROL: 202 mg/dL — AB (ref 0–200)
HDL: 55 mg/dL (ref >40)
LDL CALC: 98 mg/dL (ref 0–99)
TRIGLYCERIDES: 246 mg/dL — AB (ref <150)
VLDL: 49 mg/dL — AB (ref 0–40)

## 2014-10-19 LAB — TROPONIN I
Troponin I: <0.03 ng/mL (ref <0.031)
Troponin I: <0.03 ng/mL (ref <0.031)
Troponin I: <0.03 ng/mL (ref <0.031)

## 2014-10-19 LAB — LIPASE, BLOOD: Lipase: 19 U/L — ABNORMAL LOW (ref 22–51)

## 2014-10-19 LAB — HEPATIC FUNCTION PANEL
ALBUMIN: 2.9 g/dL — AB (ref 3.5–5.0)
ALT: 20 U/L (ref 17–63)
AST: 24 U/L (ref 15–41)
Alkaline Phosphatase: 67 U/L (ref 38–126)
BILIRUBIN DIRECT: 0.2 mg/dL (ref 0.1–0.5)
BILIRUBIN TOTAL: 0.5 mg/dL (ref 0.3–1.2)
Indirect Bilirubin: 0.3 mg/dL (ref 0.3–0.9)
Total Protein: 5.3 g/dL — ABNORMAL LOW (ref 6.5–8.1)

## 2014-10-19 LAB — DIGOXIN LEVEL

## 2014-10-19 LAB — OSMOLALITY: Osmolality: 353 mOsm/kg — ABNORMAL HIGH (ref 275–300)

## 2014-10-19 LAB — MRSA PCR SCREENING: MRSA by PCR: NEGATIVE

## 2014-10-19 MED ORDER — DIGOXIN 250 MCG PO TABS: 0.2500 mg | ORAL_TABLET | Freq: Every day | ORAL | Status: DC

## 2014-10-19 MED ORDER — LABETALOL HCL 5 MG/ML IV SOLN
5.0000 mg | INTRAVENOUS | Status: DC | PRN
  Administered 2014-10-19: 5 mg via INTRAVENOUS
  Filled 2014-10-19: qty 4

## 2014-10-19 MED ORDER — SENNOSIDES-DOCUSATE SODIUM 8.6-50 MG PO TABS: 2.0000 | ORAL_TABLET | Freq: Every evening | ORAL | Status: DC | PRN

## 2014-10-19 MED ORDER — APIXABAN 5 MG PO TABS
5.0000 mg | ORAL_TABLET | Freq: Two times a day (BID) | ORAL | Status: DC
  Administered 2014-10-19 – 2014-10-20 (×3): 5 mg via ORAL
  Filled 2014-10-19 (×3): qty 1

## 2014-10-19 MED ORDER — AMIODARONE HCL 200 MG PO TABS
200.0000 mg | ORAL_TABLET | Freq: Every day | ORAL | Status: DC
  Administered 2014-10-19 – 2014-10-20 (×2): 200 mg via ORAL
  Filled 2014-10-19 (×2): qty 1

## 2014-10-19 MED ORDER — VITAMIN B-1 100 MG PO TABS
100.0000 mg | ORAL_TABLET | Freq: Every day | ORAL | Status: DC
  Administered 2014-10-19 – 2014-10-20 (×2): 100 mg via ORAL
  Filled 2014-10-19 (×2): qty 1

## 2014-10-19 MED ORDER — FOLIC ACID 1 MG PO TABS
1.0000 mg | ORAL_TABLET | Freq: Every day | ORAL | Status: DC
  Administered 2014-10-19 – 2014-10-20 (×2): 1 mg via ORAL
  Filled 2014-10-19 (×2): qty 1

## 2014-10-19 MED ORDER — SERTRALINE HCL 50 MG PO TABS
50.0000 mg | ORAL_TABLET | Freq: Every day | ORAL | Status: DC
  Administered 2014-10-19 – 2014-10-20 (×2): 50 mg via ORAL
  Filled 2014-10-19 (×2): qty 1

## 2014-10-19 MED ORDER — ALBUTEROL SULFATE (2.5 MG/3ML) 0.083% IN NEBU: 2.5000 mg | INHALATION_SOLUTION | Freq: Four times a day (QID) | RESPIRATORY_TRACT | Status: DC | PRN

## 2014-10-19 MED ORDER — PANTOPRAZOLE SODIUM 40 MG PO TBEC
40.0000 mg | DELAYED_RELEASE_TABLET | Freq: Two times a day (BID) | ORAL | Status: DC
  Administered 2014-10-19 – 2014-10-20 (×3): 40 mg via ORAL
  Filled 2014-10-19 (×3): qty 1

## 2014-10-19 MED ORDER — DIGOXIN 0.25 MG/ML IJ SOLN
0.2500 mg | Freq: Two times a day (BID) | INTRAMUSCULAR | Status: AC
Start: 2014-10-19 — End: 2014-10-20
  Administered 2014-10-19 – 2014-10-20 (×2): 0.25 mg via INTRAVENOUS
  Filled 2014-10-19 (×2): qty 2

## 2014-10-19 MED ORDER — LORAZEPAM 2 MG/ML IJ SOLN
2.0000 mg | INTRAMUSCULAR | Status: DC | PRN
Start: 2014-10-19 — End: 2014-10-20
  Administered 2014-10-19 (×2): 2 mg via INTRAVENOUS
  Filled 2014-10-19 (×2): qty 1

## 2014-10-19 MED ORDER — NICOTINE 21 MG/24HR TD PT24
21.0000 mg | MEDICATED_PATCH | Freq: Every day | TRANSDERMAL | Status: DC
  Administered 2014-10-19 – 2014-10-20 (×2): 21 mg via TRANSDERMAL
  Filled 2014-10-19 (×2): qty 1

## 2014-10-19 MED ORDER — METOPROLOL TARTRATE 25 MG PO TABS
50.0000 mg | ORAL_TABLET | Freq: Once | ORAL | Status: AC
  Administered 2014-10-19: 50 mg via ORAL
  Filled 2014-10-19: qty 2

## 2014-10-19 MED ORDER — DIGOXIN 0.25 MG/ML IJ SOLN: 0.2500 mg | Freq: Two times a day (BID) | INTRAMUSCULAR | Status: DC

## 2014-10-19 MED ORDER — DIGOXIN 0.25 MG/ML IJ SOLN
0.2500 mg | Freq: Once | INTRAMUSCULAR | Status: AC
  Administered 2014-10-19: 0.25 mg via INTRAVENOUS
  Filled 2014-10-19: qty 2

## 2014-10-19 MED ORDER — HYDROXYZINE PAMOATE 25 MG PO CAPS
25.0000 mg | ORAL_CAPSULE | Freq: Four times a day (QID) | ORAL | Status: DC | PRN
  Filled 2014-10-19: qty 1

## 2014-10-19 MED ORDER — NICOTINE 21 MG/24HR TD PT24
21.0000 mg | MEDICATED_PATCH | Freq: Every day | TRANSDERMAL | Status: DC
  Filled 2014-10-19: qty 1

## 2014-10-19 MED ORDER — ADULT MULTIVITAMIN W/MINERALS CH
1.0000 | ORAL_TABLET | Freq: Every day | ORAL | Status: DC
  Administered 2014-10-19 – 2014-10-20 (×2): 1 via ORAL
  Filled 2014-10-19 (×2): qty 1

## 2014-10-19 MED ORDER — METOPROLOL TARTRATE 50 MG PO TABS
50.0000 mg | ORAL_TABLET | Freq: Two times a day (BID) | ORAL | Status: DC
  Administered 2014-10-19 – 2014-10-20 (×3): 50 mg via ORAL
  Filled 2014-10-19 (×3): qty 1

## 2014-10-19 MED ORDER — STROKE: EARLY STAGES OF RECOVERY BOOK
Freq: Once | Status: AC
  Administered 2014-10-19: 1
  Filled 2014-10-19: qty 1

## 2014-10-19 NOTE — Progress Notes (Signed)
Called MRI to check on status of scan.  Per MRI it will be awhile possible after change of shift.  Md made aware.  Will continue to monitor Karena Addison T

## 2014-10-19 NOTE — ED Provider Notes (Signed)
The patient had persistent hypotension despite fluid resuscitation and Cardizem drip 49 year old male with known chronic atrial fibrillation, also known to be an alcoholic who was found under a bridge surrounded by multiple bottles of 40 ounce beers which have been emptied. He is altered, confused, tachycardic with a heart rate in the 140s, rapid atrial fibrillation, hypotensive into the 80s, ongoing confusion. On exam the patient has a rapid heart rate, he has decreased heart sounds, soft nontender abdomen, no signs of trauma or peripheral edema, thready pulses at the radial arteries, moist mucous membranes, withdraws from painful stimuli, follows occasional commands but does not speak.  EKG shows rapid atrial fibrillation,  Labs and chest x-ray otherwise rather unremarkable except for alcohol level. He has been persistently hypotensive despite fluid resuscitation. He is requiring Cardizem with a Cardizem drip. He will need to be admitted to the hospital to a high level of care for ongoing symptoms. Critical care provided for this patient and rapid atrial fibrillation.     EKG Interpretation  Date/Time:  Saturday October 18 2014 21:01:59 EDT Ventricular Rate:  99 PR Interval:    QRS Duration: 88 QT Interval:  405 QTC Calculation: 520 R Axis:   -2 Text Interpretation:  Atrial fibrillation Probable inferior infarct, old Prolonged QT interval ED PHYSICIAN INTERPRETATION AVAILABLE IN [facility] Confirmed by TEST, Record ([id number]) on 10/19/2014 8:35:51 AM       CRITICAL CARE Performed by: Eber Hong D Total critical care time: 35 Critical care time was exclusive of separately billable procedures and treating other patients. Critical care was necessary to treat or prevent imminent or life-threatening deterioration. Critical care was time spent personally by me on the following activities: development of treatment plan with patient and/or surrogate as well as nursing, discussions with  consultants, evaluation of patient's response to treatment, examination of patient, obtaining history from patient or surrogate, ordering and performing treatments and interventions, ordering and review of laboratory studies, ordering and review of radiographic studies, pulse oximetry and re-evaluation of patient's condition.  Medical screening examination/treatment/procedure(s) were conducted as a shared visit with non-physician practitioner(s) and myself.  I personally evaluated the patient during the encounter.  Clinical Impression:   Final diagnoses:  Atrial fibrillation with RVR  Hypotension, unspecified hypotension type  Alcohol abuse         Eber Hong, MD 10/19/14 [phone]

## 2014-10-19 NOTE — Progress Notes (Signed)
VASCULAR LAB PRELIMINARY  PRELIMINARY  PRELIMINARY  PRELIMINARY  Carotid duplex completed.    Preliminary report:  1-39% plaquing.  Vertebral artery flow is antegrade.   KANADY, CANDACE, RVT 10/19/2014, 3:17 PM

## 2014-10-19 NOTE — Progress Notes (Signed)
[facility] PROGRESS NOTE  Zebbie Ace ZOX:[id number] DOB: [birth year] DOA: 10/18/2014 PCP: Valera Castle, MD  Assessment/Plan: Left-sided numbness  - MRI/MRA brain was neg for acute process - Carotid duplex unremarkable - 2D echocardiograms was done recently thus was deferred - PT/OT/speech therapy consulted - Hemoglobin A1c and lipid panel - On eliquis for stroke prevention - Will check b12/folate. ?neuropathy in setting of chronic etoh abuse  A. fib with RVR - Suspicion for gross medical noncompliance - Serum dig levels remain subtherapeutic. Discussed with pharmacy - will load with IV digoxin - Resumed home metoprolol and digoxin - Resumed amiodarone at 200 mg once daily - Unable to tolerate cardizem gtt secondary to profound hypotension - Serial troponins neg - TSH: 0.774 - Last echocardiogram was 07/03/2014 which demonstrated a moderately dilated left ventricle with severely reduced systolic function, no regional wall motion abnormalities. He had severe mitral valve regurgitation, severely dilated left atrium, dilated right ventricle with reduced systolic function, and moderate to severely dilated right atrium. His peak PA pressure was 40 mmHg. - Resumed Apixaban  Suspected alcohol-related nonischemic cardiomyopathy with severely reduced EF, not on diuretics - Resumed beta blocker - Held ACEI until BP stable  Depression/anxiety, stable, denies suicidal ideation, continue Zoloft  Alcoholic with history of alcohol withdrawal - CIWA protocol with every hour Ativan when necessary - Thiamine, folate, multivitamin  Abdominal pain, suspect alcoholic pancreatitis or gastritis - Start PPI - Check LFTs, lipase, and UA  Code Status: Full Family Communication: Pt in room (indicate person spoken with, relationship, and if by phone, the number) Disposition Plan: Pending   Consultants:    Procedures:    Antibiotics:   (indicate start date, and stop  date if known)  HPI/Subjective: Eager to go home  Objective: Filed Vitals:   10/19/14 1100 10/19/14 1200 10/19/14 1400 10/19/14 1500  BP: 127/74 122/83 122/90 95/77  Pulse:  115    Temp:  98 F (36.7 C)    TempSrc:  Oral    Resp: Height:      Weight:      SpO2: 98% 99%      Intake/Output Summary (Last 24 hours) at 10/19/14 1613 Last data filed at 10/19/14 1300  Gross per 24 hour  Intake   1980 ml  Output   1975 ml  Net      5 ml   Filed Weights   10/18/14 1850 10/19/14 0202  Weight: 81.647 kg (180 lb) 84.9 kg (187 lb 2.7 oz)    Exam:   General:  Awake, in nad  Cardiovascular: regular, s1, s2  Respiratory: normal resp effort, no wheezing  Abdomen: soft, nondistended  Musculoskeletal: perfused, no clubbing   Data Reviewed: Basic Metabolic Panel:  Recent Labs Lab 10/18/14 1854  NA 134*  K 4.3  CL 101  CO2 19*  GLUCOSE 67  BUN 11  CREATININE 0.88  CALCIUM 8.4*  MG 1.8   Liver Function Tests:  Recent Labs Lab 10/19/14 0244  AST 24  ALT 20  ALKPHOS 67  BILITOT 0.5  PROT 5.3*  ALBUMIN 2.9*    Recent Labs Lab 10/19/14 0244  LIPASE 19*   No results for input(s): AMMONIA in the last 168 hours. CBC:  Recent Labs Lab 10/18/14 1854  WBC 5.3  HGB 13.1  HCT 39.1  MCV 92.7  PLT 177   Cardiac Enzymes:  Recent Labs Lab 10/19/14 0240 10/19/14 0828 10/19/14 1430  TROPONINI <0.03 <0.03 <0.03   BNP (last 3  results)  Recent Labs  07/02/14 2348 07/30/14 1353  BNP 873.2* 780.5*    ProBNP (last 3 results) No results for input(s): PROBNP in the last 8760 hours.  CBG: No results for input(s): GLUCAP in the last 168 hours.  Recent Results (from the past 240 hour(s))  MRSA PCR Screening     Status: None   Collection Time: 10/19/14  2:42 AM  Result Value Ref Range Status   MRSA by PCR NEGATIVE NEGATIVE Final    Comment:        The GeneXpert MRSA Assay (FDA approved for NASAL specimens only), is one component of  a comprehensive MRSA colonization surveillance program. It is not intended to diagnose MRSA infection nor to guide or monitor treatment for MRSA infections.      Studies: Dg Chest 2 View  10/18/2014   CLINICAL DATA:  Atrial fibrillation. History of hypertension tobacco abuse and ethanol abuse.  EXAM: CHEST  2 VIEW  COMPARISON:  07/30/2014  FINDINGS: Mild to moderate enlargement of the cardiopericardial silhouette, stable. No mediastinal or hilar masses or pathologically enlarged lymph nodes.  Minor scarring in the right mid lung, stable. Lungs otherwise clear. No pleural effusion or pneumothorax.  Bony thorax is intact.  IMPRESSION: No acute cardiopulmonary disease.  Stable cardiomegaly.   Electronically Signed   By: Amie Portland M.D.   On: 10/18/2014 20:18   Mr Maxine Glenn Head Wo Contrast  10/19/2014   CLINICAL DATA:  Acute presentation with left-sided numbness.  EXAM: MRI HEAD WITHOUT CONTRAST  MRA HEAD WITHOUT CONTRAST  TECHNIQUE: Multiplanar, multiecho pulse sequences of the brain and surrounding structures were obtained without intravenous contrast. Angiographic images of the head were obtained using MRA technique without contrast.  COMPARISON:  MRI 08/01/2014  FINDINGS: MRI HEAD FINDINGS  Diffusion imaging does not show any acute or subacute infarction. There is no abnormality affecting the brainstem or cerebellum. The cerebral hemispheres show mild chronic small-vessel ischemic change within the hemispheric white matter. No cortical or large vessel territory infarction. No mass lesion, hemorrhage, hydrocephalus or extra-axial collection. No pituitary mass. No inflammatory sinus disease. No skull or skullbase lesion.  MRA HEAD FINDINGS  Both internal carotid arteries are widely patent into the brain. No siphon stenosis. The anterior and middle cerebral vessels are normal without proximal stenosis, aneurysm or vascular malformation. Both vertebral arteries are widely patent to the basilar. No basilar  stenosis. Posterior circulation branch vessels are normal.  IMPRESSION: No acute finding. Minimal small vessel change of the cerebral hemispheric white matter.  Normal intracranial MR angiography of the large and medium size vessels.   Electronically Signed   By: Paulina Fusi M.D.   On: 10/19/2014 13:46   Mr Brain Wo Contrast  10/19/2014   CLINICAL DATA:  Acute presentation with left-sided numbness.  EXAM: MRI HEAD WITHOUT CONTRAST  MRA HEAD WITHOUT CONTRAST  TECHNIQUE: Multiplanar, multiecho pulse sequences of the brain and surrounding structures were obtained without intravenous contrast. Angiographic images of the head were obtained using MRA technique without contrast.  COMPARISON:  MRI 08/01/2014  FINDINGS: MRI HEAD FINDINGS  Diffusion imaging does not show any acute or subacute infarction. There is no abnormality affecting the brainstem or cerebellum. The cerebral hemispheres show mild chronic small-vessel ischemic change within the hemispheric white matter. No cortical or large vessel territory infarction. No mass lesion, hemorrhage, hydrocephalus or extra-axial collection. No pituitary mass. No inflammatory sinus disease. No skull or skullbase lesion.  MRA HEAD FINDINGS  Both internal carotid arteries are  widely patent into the brain. No siphon stenosis. The anterior and middle cerebral vessels are normal without proximal stenosis, aneurysm or vascular malformation. Both vertebral arteries are widely patent to the basilar. No basilar stenosis. Posterior circulation branch vessels are normal.  IMPRESSION: No acute finding. Minimal small vessel change of the cerebral hemispheric white matter.  Normal intracranial MR angiography of the large and medium size vessels.   Electronically Signed   By: Paulina Fusi M.D.   On: 10/19/2014 13:46    Scheduled Meds: . amiodarone  200 mg Oral Daily  . apixaban  5 mg Oral BID  . digoxin  0.25 mg Intravenous BID  . [START ON 10/20/2014] digoxin  0.25 mg Oral Daily   . folic acid  1 mg Oral Daily  . metoprolol  50 mg Oral BID  . multivitamin with minerals  1 tablet Oral Daily  . nicotine  21 mg Transdermal Daily  . pantoprazole  40 mg Oral BID AC  . sertraline  50 mg Oral Daily  . thiamine  100 mg Oral Daily   Continuous Infusions: . sodium chloride 1,000 mL (10/19/14 0800)    Principal Problem:   Atrial fibrillation with rapid ventricular response Active Problems:   Anxiety and depression   Hypotension   Severe alcohol use disorder   Alcohol abuse   TIA (transient ischemic attack)    CHIU, STEPHEN K  [facility] Pager [phone]. If 7PM-7AM, please contact night-coverage at [email or web address], password [facility] 10/19/2014, 4:13 PM  LOS: 0 days

## 2014-10-19 NOTE — Progress Notes (Addendum)
Pt confessed that he had suicidal ideations last night when he was out with his friends. He stated he "had a knife to his throat," but his friends talked him out of it. He was also very intoxicated and it was the most he "has ever had to drink." Pt denied any suicidal ideations or intent today. He says he just "feels down," but knows better and is thinking more clearly than last night. Dr. Rhona Leavens paged and aware. Will continue to assess and maintain pt safe.

## 2014-10-19 NOTE — H&P (Addendum)
[facility] History and Physical  Donshay Lupinski ZOX:[id number] DOB: December 10, 1965 DOA: 10/18/2014  Referring physician:  Eber Hong PCP:  Valera Castle, MD   Chief Complaint:  Left sided numbness  HPI:  The patient is a 49 y.o. year-old male with history of alcohol abuse, anxiety and depression, hypertension, paroxysmal atrial fibrillation, medication noncompliance who presents with left-sided numbness.  The patient  is homeless and states he does not have a primary care doctor. He ran out of his medications approximately 1 week ago. This evening around 6 PM he was about to get him lined for a meal under a bridge when he developed sudden onset left face, left arm, and left leg numbness. The numbness lasted for approximately 20 minutes and was not associated with weakness, slurred speech, confusion, blurred vision. His girlfriend encouraged him to allow her to call EMS but he declined. The patient is currently intoxicated and his history is somewhat limited and there are no family or friends at the bedside to cooperate. Per EMS notes, they were called and when they arrived he was unresponsive under the bridge. He had some seizure-like activity but the EMS notes state that his activity was "actually shown to be a behavioral disturbance". He was intoxicated and agitated and they gave him 5 mg of Versed intramuscularly which caused him to become mildly hypotensive. He was in A. fib with RVR with a heart rate as high as 170.   He admitted to drinking 19 40-ounce beers today.  In the emergency department, he was started on diltiazem and became even more hypotensive.  His heart rate trended down from the mid 100s to the 70s. He is afebrile, his labs are notable only for mild hyponatremia of 134. His calcium is 8.4. Chest x-ray was unremarkable. His EKG demonstrated atrial fibrillation with RVR in the 130s, no ST segment elevations or depressions or T-wave inversions.  His initial troponin was  negative.   Review of Systems:  Limited by patient's intoxication. General:  Denies fevers, chills HEENT:  Denies changes sinus congestion, sore throat CV:  Denies chest pain and palpitations, lower extremity edema.  PULM:  Denies SOB, wheezing, cough.   GI:  Denies nausea, vomiting, constipation, diarrhea.   GU:  Denies dysuria, frequency NEURO:  Per history of present illness PSYCH:  Denies anxiety and depression.    Past Medical History  Diagnosis Date  . Hypertension   . Alcoholism /alcohol abuse   . Anxiety and depression   . Tobacco abuse   . Atrial fibrillation    Past Surgical History  Procedure Laterality Date  . Hernia repair      UHR x2  . Tee without cardioversion N/A 08/01/2014    Procedure: TRANSESOPHAGEAL ECHOCARDIOGRAM (TEE);  Surgeon: Orpah Cobb, MD;  Location: [facility];  Service: Cardiovascular;  Laterality: N/A;   Social History:  reports that he quit smoking about 5 months ago. His smoking use included Cigarettes. He has a 30 pack-year smoking history. He has never used smokeless tobacco. He reports that he drinks alcohol. He reports that he does not use illicit drugs.  Allergies  Allergen Reactions  . Bee Venom Anaphylaxis    Uses epi pen-- last used 08/23/11    Family History  Problem Relation Age of Onset  . Heart attack Mother   . Heart attack Father   . Hypertension Mother   . Hypertension Father      Prior to Admission medications   Medication Sig Start Date End Date Taking?  Authorizing Provider  albuterol (PROVENTIL HFA;VENTOLIN HFA) 108 (90 BASE) MCG/ACT inhaler Inhale 2 puffs into the lungs every 6 (six) hours as needed for wheezing or shortness of breath. 07/08/14   Standley Brooking, MD  amiodarone (PACERONE) 200 MG tablet Take 2 tablets (400 mg total) by mouth 2 (two) times daily. Please take 400 mg (2 tablets) twice a day for one week, then drop down to 200 mg (one tablet) once daily 08/03/14   Ripudeep Jenna Luo, MD  apixaban (ELIQUIS) 5 MG  TABS tablet Take 1 tablet (5 mg total) by mouth 2 (two) times daily. 08/03/14   Ripudeep Jenna Luo, MD  Camphor-Eucalyptus-Menthol (MEDICATED CHEST RUB) 4.73-1.2-2.6 % OINT Apply 1 application topically daily as needed (congestion).    Historical Provider, MD  digoxin (LANOXIN) 0.25 MG tablet Take 1 tablet (0.25 mg total) by mouth daily. 08/03/14   Ripudeep Jenna Luo, MD  hydrOXYzine (VISTARIL) 25 MG capsule Take 25 mg by mouth 4 (four) times daily as needed for anxiety.    Historical Provider, MD  lisinopril (PRINIVIL,ZESTRIL) 2.5 MG tablet Take 1 tablet (2.5 mg total) by mouth daily. 07/08/14   Standley Brooking, MD  metoprolol (LOPRESSOR) 50 MG tablet Take 1 tablet (50 mg total) by mouth 2 (two) times daily. 08/03/14   Ripudeep Jenna Luo, MD  predniSONE (DELTASONE) 10 MG tablet Take 1 tablet (10 mg total) by mouth daily with breakfast. 07/08/14   Standley Brooking, MD  sertraline (ZOLOFT) 50 MG tablet Take 50 mg by mouth daily.    Historical Provider, MD   Physical Exam: Filed Vitals:   10/18/14 2300 10/18/14 2345 10/19/14 0030 10/19/14 0130  BP: 86/52 102/63 95/68 99/64   Pulse: 40 88 36 71  Temp:      TempSrc:      Resp: 18 16 19 12   Height:      Weight:      SpO2: 99% 99% 99% 98%     General:  Adult male, no acute distress  Eyes:  PERRL, anicteric, non-injected.  ENT:  Nares clear.  OP clear, non-erythematous without plaques or exudates.  MMM.  Neck:  Supple without TM or JVD.    Lymph:  No cervical, supraclavicular, or submandibular LAD.  Cardiovascular:  IRRR, normal S1, S2, without m/r/g.  2+ pulses, warm extremities  Respiratory:  CTA bilaterally without increased WOB.  Abdomen:  NABS.  Soft, ND, TTP in epigastrium without rebound or guarding.    Skin:  No rashes or focal lesions.  Musculoskeletal:  Normal bulk and tone.  No LE edema.  Bilateral calf tenderness without swelling, erythema  Psychiatric:  A & O to person, place, but not time.  Flat affect.  Neurologic:  CN 3-12 intact.   5/5 strength upper extremities.  Barely able to lift each leg off of the bed for a second or 2 before collapsing secondary to pain.  Sensation intact to light touch on arms and legs, but diminished on hands and feet bilaterally.  Mild bilateral dysmetria of hands.  Labs on Admission:  Basic Metabolic Panel:  Recent Labs Lab 10/18/14 1854  NA 134*  K 4.3  CL 101  CO2 19*  GLUCOSE 67  BUN 11  CREATININE 0.88  CALCIUM 8.4*  MG 1.8   Liver Function Tests: No results for input(s): AST, ALT, ALKPHOS, BILITOT, PROT, ALBUMIN in the last 168 hours. No results for input(s): LIPASE, AMYLASE in the last 168 hours. No results for input(s): AMMONIA in the last 168  hours. CBC:  Recent Labs Lab 10/18/14 1854  WBC 5.3  HGB 13.1  HCT 39.1  MCV 92.7  PLT 177   Cardiac Enzymes: No results for input(s): CKTOTAL, CKMB, CKMBINDEX, TROPONINI in the last 168 hours.  BNP (last 3 results)  Recent Labs  07/02/14 2348 07/30/14 1353  BNP 873.2* 780.5*    ProBNP (last 3 results) No results for input(s): PROBNP in the last 8760 hours.  CBG: No results for input(s): GLUCAP in the last 168 hours.  Radiological Exams on Admission: Dg Chest 2 View  10/18/2014   CLINICAL DATA:  Atrial fibrillation. History of hypertension tobacco abuse and ethanol abuse.  EXAM: CHEST  2 VIEW  COMPARISON:  07/30/2014  FINDINGS: Mild to moderate enlargement of the cardiopericardial silhouette, stable. No mediastinal or hilar masses or pathologically enlarged lymph nodes.  Minor scarring in the right mid lung, stable. Lungs otherwise clear. No pleural effusion or pneumothorax.  Bony thorax is intact.  IMPRESSION: No acute cardiopulmonary disease.  Stable cardiomegaly.   Electronically Signed   By: Amie Portland M.D.   On: 10/18/2014 20:18    EKG: Independently reviewed.  Atrial fibrillation with RVR, no acute ST segment changes  Assessment/Plan Principal Problem:   Atrial fibrillation with rapid ventricular  response Active Problems:   Anxiety and depression   Hypotension   Severe alcohol use disorder   Alcohol abuse   TIA (transient ischemic attack)  ---  Left-sided numbness concerning for TIA especially given his history of atrial fibrillation and noncompliance with his anticoagulation -  MRI/MRA brain -  Carotid duplex -  He recently underwent echocardiograms of this is deferred -  PT/OT/speech therapy -  Hemoglobin A1c and lipid panel -  He will need a neurology consultation if he has stroke on MRI -  RN stroke swallow screen -  Resume anticoagulation for A. fib  Chronic A. fib with RVR, CHADS2vasc 2 due to HTN, CHF, needs anticoagulation -  Resume home metoprolol and digoxin -  Resume amiodarone at 200 mg once daily -  Discontinue diltiazem drip which is probably contributing to hypotension -  Telemetry -  Cycle troponins -  TSH: 0.774 -  Last echocardiogram was 07/03/2014 which demonstrated a moderately dilated left ventricle with severely reduced systolic function, no regional wall motion abnormalities. He had severe mitral valve regurgitation, severely dilated left atrium, dilated right ventricle with reduced systolic function, and moderate to severely dilated right atrium. His peak PA pressure was 40 mmHg. -  Resume Apixaban  Suspected alcohol-related nonischemic cardiomyopathy with severely reduced EF, not on diuretics -  Resume beta blocker -  Hold ACEI until BP stable  Depression/anxiety, stable, denies suicidal ideation, continue Zoloft  Alcoholic with history of alcohol withdrawal -  CIWA protocol with every hour Ativan when necessary -  Thiamine, folate, multivitamin  Abdominal pain, suspect alcoholic pancreatitis or gastritis -  Start PPI -  Check LFTs, lipase, and UA  Diet:  Healthy heart Access:  PIV IVF:  Yes Proph:  Apixaban  Code Status:  full code Family Communication:  patient alone Disposition Plan: Admit to stepdown due to hypotension  Time  spent: 60 min SHORT, MACKENZIE [facility] Pager [phone]  If 7PM-7AM, please contact night-coverage [email or web address] Password [email or web address] 10/19/2014, 1:54 AM

## 2014-10-19 NOTE — Progress Notes (Addendum)
Md notified of critical lab Osmolality 353.  Also pt is smoker.  Clarification if needs nicotine patch. Pt in aflutter.  Will continue to monitor Karena Addison T

## 2014-10-19 NOTE — Progress Notes (Signed)
Md made aware of pt's NIH scale of 6.  Pt did have some nausea however the CIWA scale was 10 and ativan 2mg  was just given prior to NIH being completed.   If there is change in the next NIH will notified Md.  Will continue to monitor [name]

## 2014-10-20 DIAGNOSIS — F1010 Alcohol abuse, uncomplicated: Secondary | ICD-10-CM

## 2014-10-20 DIAGNOSIS — F1099 Alcohol use, unspecified with unspecified alcohol-induced disorder: Secondary | ICD-10-CM

## 2014-10-20 DIAGNOSIS — F418 Other specified anxiety disorders: Secondary | ICD-10-CM

## 2014-10-20 LAB — BASIC METABOLIC PANEL
ANION GAP: 6 (ref 5–15)
BUN: 11 mg/dL (ref 6–20)
CALCIUM: 8.3 mg/dL — AB (ref 8.9–10.3)
CO2: 24 mmol/L (ref 22–32)
Chloride: 109 mmol/L (ref 101–111)
Creatinine, Ser: 0.87 mg/dL (ref 0.61–1.24)
Glucose, Bld: 89 mg/dL (ref 65–99)
Potassium: 3.9 mmol/L (ref 3.5–5.1)
SODIUM: 139 mmol/L (ref 135–145)

## 2014-10-20 LAB — HEMOGLOBIN A1C
Hgb A1c MFr Bld: 5.7 % — ABNORMAL HIGH (ref 4.8–5.6)
MEAN PLASMA GLUCOSE: 117 mg/dL

## 2014-10-20 LAB — VITAMIN B12: Vitamin B-12: 250 pg/mL (ref 180–914)

## 2014-10-20 LAB — DIGOXIN LEVEL
DIGOXIN LVL: 0.3 ng/mL — AB (ref 0.8–2.0)
Digoxin Level: 0.7 ng/mL — ABNORMAL LOW (ref 0.8–2.0)

## 2014-10-20 MED ORDER — DIGOXIN 250 MCG PO TABS: 0.2500 mg | ORAL_TABLET | Freq: Every day | ORAL | Status: DC

## 2014-10-20 MED ORDER — APIXABAN 5 MG PO TABS: 5.0000 mg | ORAL_TABLET | Freq: Two times a day (BID) | ORAL | Status: DC

## 2014-10-20 MED ORDER — AMIODARONE HCL 200 MG PO TABS: 200.0000 mg | ORAL_TABLET | Freq: Every day | ORAL | Status: DC

## 2014-10-20 MED ORDER — NICOTINE 21 MG/24HR TD PT24: 21.0000 mg | MEDICATED_PATCH | Freq: Every day | TRANSDERMAL | Status: DC

## 2014-10-20 MED ORDER — METOPROLOL TARTRATE 50 MG PO TABS: 50.0000 mg | ORAL_TABLET | Freq: Two times a day (BID) | ORAL | Status: DC

## 2014-10-20 NOTE — Evaluation (Signed)
Physical Therapy Evaluation Patient Details Name: [name] MRN: [id number] DOB: January 02, 1966 Today's Date: 10/20/2014   History of Present Illness  49 y.o. year-old male with history of alcohol abuse, anxiety and depression, hypertension, paroxysmal atrial fibrillation, medication noncompliance who presents with left sided weakness. Pt with Afib.   Clinical Impression  Pt reports no difficulties at baseline and has had acute dizziness since fall. Symptoms do not present as a vestibular dysfunction as there is no nystagmus, supine to sit prompts the same symptoms at rolling right. Performed supine head roll as well as bil [employer] with brief dizziness but no spinning with right dix hallpike. Pt with decreased activity tolerance and balance who will benefit from acute therapy to maximize mobility, function and balance to return to independent function.     Follow Up Recommendations No PT follow up    Equipment Recommendations  None recommended by PT    Recommendations for Other Services       Precautions / Restrictions Precautions Precautions: Fall      Mobility  Bed Mobility Overal bed mobility: Needs Assistance Bed Mobility: Supine to Sit     Supine to sit: Supervision     General bed mobility comments: increased time and difficulty to rise from supine to sitting  Transfers Overall transfer level: Needs assistance   Transfers: Sit to/from Stand Sit to Stand: Min guard         General transfer comment: cues for hand placement with assist for balance and safety.   Ambulation/Gait Ambulation/Gait assistance: Min guard Ambulation Distance (Feet): 160 Feet Assistive device: None Gait Pattern/deviations: Step-through pattern;Decreased stride length   Gait velocity interpretation: Below normal speed for age/gender General Gait Details: decreased speed with 2 standing breaks secondary to Afib  Stairs            Wheelchair Mobility    Modified Rankin  (Stroke Patients Only)       Balance Overall balance assessment: Needs assistance   Sitting balance-Leahy Scale: Good       Standing balance-Leahy Scale: Fair                               Pertinent Vitals/Pain Pain Assessment: No/denies pain  BP supine 132/98, sitting 131/83, standing 120/97, HR 105-140     Home Living Family/patient expects to be discharged to:: Private residence Living Arrangements: Non-relatives/Friends Available Help at Discharge: Available PRN/intermittently Type of Home: House Home Access: Level entry     Home Layout: One level Home Equipment: None Additional Comments: pt states he stays with afriend, works Systems developer in [employer]    Prior Function Level of Independence: Independent               Higher education careers adviser        Extremity/Trunk Assessment   Upper Extremity Assessment: Generalized weakness           Lower Extremity Assessment: Generalized weakness      Cervical / Trunk Assessment: Normal  Communication   Communication: No difficulties  Cognition Arousal/Alertness: Awake/alert Behavior During Therapy: Flat affect Overall Cognitive Status: Impaired/Different from baseline Area of Impairment: Memory;Safety/judgement;Problem solving     Memory: Decreased short-term memory   Safety/Judgement: Decreased awareness of safety   Problem Solving: Slow processing      General Comments      Exercises        Assessment/Plan    PT Assessment Patient needs continued PT services  PT  Diagnosis Difficulty walking   PT Problem List Decreased activity tolerance;Decreased balance;Cardiopulmonary status limiting activity  PT Treatment Interventions Gait training;Functional mobility training;Therapeutic activities;Therapeutic exercise;Balance training;Patient/family education   PT Goals (Current goals can be found in the Care Plan section) Acute Rehab PT Goals Patient Stated Goal: return to work PT Goal  Formulation: With patient Time For Goal Achievement: 11/03/14 Potential to Achieve Goals: Good    Frequency Min 3X/week   Barriers to discharge Decreased caregiver support      Co-evaluation               End of Session Equipment Utilized During Treatment: Gait belt Activity Tolerance: Patient tolerated treatment well Patient left: in chair;with call bell/phone within reach;with chair alarm set Nurse Communication: Mobility status         Time: 7741-4239 PT Time Calculation (min) (ACUTE ONLY): 25 min   Charges:   PT Evaluation $Initial PT Evaluation Tier I: 1 Procedure     PT G CodesDelorse Lek 10/20/2014, 11:14 AM Delaney Meigs, PT [phone]

## 2014-10-20 NOTE — Progress Notes (Signed)
Pt alert and oriented.  Pt still admits to feeling a little down, but no suicidal ideations.  Will continue to monitor.  Karena Addison T

## 2014-10-20 NOTE — Discharge Summary (Signed)
Physician Discharge Summary  Gioni Gerry ZOX:[id number] DOB: Feb 28, 1965 DOA: 10/18/2014  PCP: Valera Castle, MD  Admit date: 10/18/2014 Discharge date: 10/20/2014  Time spent: 20 minutes  Recommendations for Outpatient Follow-up:  1. Follow up with PCP as scheduled already 2. Please note, lisinopril was held secondary to normal BP on discharge. To be resumed by PCP if needed  Discharge Diagnoses:  Principal Problem:   Atrial fibrillation with rapid ventricular response Active Problems:   Anxiety and depression   Hypotension   Severe alcohol use disorder   Alcohol abuse   TIA (transient ischemic attack)   Discharge Condition: Stable  Diet recommendation: heart healthy  Filed Weights   10/18/14 1850 10/19/14 0202  Weight: 81.647 kg (180 lb) 84.9 kg (187 lb 2.7 oz)    History of present illness:  Please see admit h and p from 9/18 for details. Briefly, pt presented with L sided numbness, found to be in afib rvr. Patient was admitted for further work up.  Hospital Course:  Left-sided numbness  - MRI/MRA brain was neg for acute process - Carotid duplex unremarkable - 2D echocardiograms was done recently thus was deferred - PT/OT/speech therapy consulted, recs for no PT follow up - Hemoglobin A1c 5.7 and LDL 98 - On eliquis for stroke prevention - B12 w/in normal limits - folate pending  A. fib with RVR - Suspicion for gross medical noncompliance, states running out of meds prior to admit - Serum dig levels noted to be subtherapeutic. Patient given loading doses of digoxin with level of 0.7 on discharge. Discussed with pharmacy who does not recommend another loading dose at this time, and instead recommended resuming usual PO dose starting the day after discharge - Resumed home metoprolol and digoxin - Resumed amiodarone at 200 mg once daily - Unable to tolerate cardizem gtt secondary to profound hypotension - Serial troponins neg - TSH: 0.774 - Last  echocardiogram was 07/03/2014 which demonstrated a moderately dilated left ventricle with severely reduced systolic function, no regional wall motion abnormalities. He had severe mitral valve regurgitation, severely dilated left atrium, dilated right ventricle with reduced systolic function, and moderate to severely dilated right atrium. His peak PA pressure was 40 mmHg. - Resumed Apixaban  Suspected alcohol-related nonischemic cardiomyopathy with severely reduced EF, not on diuretics - Resumed beta blocker - Held ACEI until BP stable  Depression/anxiety, stable, denies suicidal ideation, continue Zoloft  Alcoholic with history of alcohol withdrawal - CIWA protocol with every hour Ativan when necessary - Thiamine, folate, multivitamin  Abdominal pain, suspect alcoholic pancreatitis or gastritis - Start PPI - Check LFTs, lipase, and UA  Discharge Exam: Filed Vitals:   10/20/14 0755 10/20/14 0800 10/20/14 1145 10/20/14 1200  BP: 132/98 120/97 110/79 113/95  Pulse:  95    Temp: 98.7 F (37.1 C)  98.5 F (36.9 C)   TempSrc: Oral  Rectal   Resp:   19 26  Height:      Weight:      SpO2:        General: Awake, in nad Cardiovascular: regular, s1, s2 Respiratory: normal resp effort, no wheezing  Discharge Instructions     Medication List    ASK your doctor about these medications        albuterol 108 (90 BASE) MCG/ACT inhaler  Commonly known as:  PROVENTIL HFA;VENTOLIN HFA  Inhale 2 puffs into the lungs every 6 (six) hours as needed for wheezing or shortness of breath.     amiodarone 200 MG tablet  Commonly known as:  PACERONE  Take 2 tablets (400 mg total) by mouth 2 (two) times daily. Please take 400 mg (2 tablets) twice a day for one week, then drop down to 200 mg (one tablet) once daily     apixaban 5 MG Tabs tablet  Commonly known as:  ELIQUIS  Take 1 tablet (5 mg total) by mouth 2 (two) times daily.     digoxin 0.25 MG tablet  Commonly known as:  LANOXIN   Take 1 tablet (0.25 mg total) by mouth daily.     HYDROcodone-acetaminophen 5-325 MG per tablet  Commonly known as:  NORCO/VICODIN  Take 1 tablet by mouth every 6 (six) hours as needed. for pain     hydrOXYzine 25 MG capsule  Commonly known as:  VISTARIL  Take 25 mg by mouth 4 (four) times daily as needed for anxiety.     lisinopril 2.5 MG tablet  Commonly known as:  PRINIVIL,ZESTRIL  Take 1 tablet (2.5 mg total) by mouth daily.     MEDICATED CHEST RUB 4.73-1.2-2.6 % Oint  Apply 1 application topically daily as needed (congestion).     metoprolol 50 MG tablet  Commonly known as:  LOPRESSOR  Take 1 tablet (50 mg total) by mouth 2 (two) times daily.     polyethylene glycol packet  Commonly known as:  MIRALAX / GLYCOLAX  Take 17 g by mouth daily.     predniSONE 10 MG tablet  Commonly known as:  DELTASONE  Take 1 tablet (10 mg total) by mouth daily with breakfast.     sertraline 50 MG tablet  Commonly known as:  ZOLOFT  Take 50 mg by mouth daily.       Allergies  Allergen Reactions  . Bee Venom Anaphylaxis    Uses epi pen-- last used 08/23/11      The results of significant diagnostics from this hospitalization (including imaging, microbiology, ancillary and laboratory) are listed below for reference.    Significant Diagnostic Studies: Dg Chest 2 View  10/18/2014   CLINICAL DATA:  Atrial fibrillation. History of hypertension tobacco abuse and ethanol abuse.  EXAM: CHEST  2 VIEW  COMPARISON:  07/30/2014  FINDINGS: Mild to moderate enlargement of the cardiopericardial silhouette, stable. No mediastinal or hilar masses or pathologically enlarged lymph nodes.  Minor scarring in the right mid lung, stable. Lungs otherwise clear. No pleural effusion or pneumothorax.  Bony thorax is intact.  IMPRESSION: No acute cardiopulmonary disease.  Stable cardiomegaly.   Electronically Signed   By: Amie Portland M.D.   On: 10/18/2014 20:18   Mr Maxine Glenn Head Wo Contrast  10/19/2014   CLINICAL  DATA:  Acute presentation with left-sided numbness.  EXAM: MRI HEAD WITHOUT CONTRAST  MRA HEAD WITHOUT CONTRAST  TECHNIQUE: Multiplanar, multiecho pulse sequences of the brain and surrounding structures were obtained without intravenous contrast. Angiographic images of the head were obtained using MRA technique without contrast.  COMPARISON:  MRI 08/01/2014  FINDINGS: MRI HEAD FINDINGS  Diffusion imaging does not show any acute or subacute infarction. There is no abnormality affecting the brainstem or cerebellum. The cerebral hemispheres show mild chronic small-vessel ischemic change within the hemispheric white matter. No cortical or large vessel territory infarction. No mass lesion, hemorrhage, hydrocephalus or extra-axial collection. No pituitary mass. No inflammatory sinus disease. No skull or skullbase lesion.  MRA HEAD FINDINGS  Both internal carotid arteries are widely patent into the brain. No siphon stenosis. The anterior and middle cerebral vessels are normal without  proximal stenosis, aneurysm or vascular malformation. Both vertebral arteries are widely patent to the basilar. No basilar stenosis. Posterior circulation branch vessels are normal.  IMPRESSION: No acute finding. Minimal small vessel change of the cerebral hemispheric white matter.  Normal intracranial MR angiography of the large and medium size vessels.   Electronically Signed   By: Paulina Fusi M.D.   On: 10/19/2014 13:46   Mr Brain Wo Contrast  10/19/2014   CLINICAL DATA:  Acute presentation with left-sided numbness.  EXAM: MRI HEAD WITHOUT CONTRAST  MRA HEAD WITHOUT CONTRAST  TECHNIQUE: Multiplanar, multiecho pulse sequences of the brain and surrounding structures were obtained without intravenous contrast. Angiographic images of the head were obtained using MRA technique without contrast.  COMPARISON:  MRI 08/01/2014  FINDINGS: MRI HEAD FINDINGS  Diffusion imaging does not show any acute or subacute infarction. There is no abnormality  affecting the brainstem or cerebellum. The cerebral hemispheres show mild chronic small-vessel ischemic change within the hemispheric white matter. No cortical or large vessel territory infarction. No mass lesion, hemorrhage, hydrocephalus or extra-axial collection. No pituitary mass. No inflammatory sinus disease. No skull or skullbase lesion.  MRA HEAD FINDINGS  Both internal carotid arteries are widely patent into the brain. No siphon stenosis. The anterior and middle cerebral vessels are normal without proximal stenosis, aneurysm or vascular malformation. Both vertebral arteries are widely patent to the basilar. No basilar stenosis. Posterior circulation branch vessels are normal.  IMPRESSION: No acute finding. Minimal small vessel change of the cerebral hemispheric white matter.  Normal intracranial MR angiography of the large and medium size vessels.   Electronically Signed   By: Paulina Fusi M.D.   On: 10/19/2014 13:46    Microbiology: Recent Results (from the past 240 hour(s))  MRSA PCR Screening     Status: None   Collection Time: 10/19/14  2:42 AM  Result Value Ref Range Status   MRSA by PCR NEGATIVE NEGATIVE Final    Comment:        The GeneXpert MRSA Assay (FDA approved for NASAL specimens only), is one component of a comprehensive MRSA colonization surveillance program. It is not intended to diagnose MRSA infection nor to guide or monitor treatment for MRSA infections.      Labs: Basic Metabolic Panel:  Recent Labs Lab 10/18/14 1854 10/20/14 0210  NA 134* 139  K 4.3 3.9  CL 101 109  CO2 19* 24  GLUCOSE 67 89  BUN 11 11  CREATININE 0.88 0.87  CALCIUM 8.4* 8.3*  MG 1.8  --    Liver Function Tests:  Recent Labs Lab 10/19/14 0244  AST 24  ALT 20  ALKPHOS 67  BILITOT 0.5  PROT 5.3*  ALBUMIN 2.9*    Recent Labs Lab 10/19/14 0244  LIPASE 19*   No results for input(s): AMMONIA in the last 168 hours. CBC:  Recent Labs Lab 10/18/14 1854  WBC 5.3  HGB  13.1  HCT 39.1  MCV 92.7  PLT 177   Cardiac Enzymes:  Recent Labs Lab 10/19/14 0240 10/19/14 0828 10/19/14 1430  TROPONINI <0.03 <0.03 <0.03   BNP: BNP (last 3 results)  Recent Labs  07/02/14 2348 07/30/14 1353  BNP 873.2* 780.5*    ProBNP (last 3 results) No results for input(s): PROBNP in the last 8760 hours.  CBG: No results for input(s): GLUCAP in the last 168 hours.  Signed:  Rickiya Picariello K  [facility] 10/20/2014, 3:07 PM

## 2014-10-20 NOTE — Progress Notes (Signed)
Assisted pt up to [facility].  Pt c/o little dizziness, unsteady.  Hr increased to 125 with activity.  After assisted pt back to bed dizziness and Hr improved. Will continue to monitor Karena Addison T

## 2014-10-20 NOTE — Care Management Note (Addendum)
Case Management Note  Patient Details  Name: [name] MRN: [id number] Date of Birth: 02-Feb-1965  Subjective/Objective:  Adm w at fib                Action/Plan:lives w fam   Expected Discharge Date:                Expected Discharge Plan:  Home/Self Care  In-House Referral:     Discharge planning Services  [facility], CM Consult  Post Acute Care Choice:    Choice offered to:     DME Arranged:    DME Agency:     HH Arranged:    HH Agency:     Status of Service:     Medicare Important Message Given:    Date Medicare IM Given:    Medicare IM give by:    Date Additional Medicare IM Given:    Additional Medicare Important Message give by:     If discussed at Long Length of Stay Meetings, dates discussed:    Additional Comments: gave pt inform on [facility]. They have pharm that may assist w pt meds. Pt gets meds at present thru health dept. He is on pt assist program for his eliquis. States has no prob getting his meds.  Hanley Hays, RN 10/20/2014, 11:26 AM

## 2014-10-22 LAB — FOLATE RBC
FOLATE, HEMOLYSATE: 452.7 ng/mL
Folate, RBC: 1240 ng/mL (ref >498)
Hematocrit: 36.5 % — ABNORMAL LOW (ref 37.5–51.0)

## 2015-05-14 ENCOUNTER — Encounter (HOSPITAL_COMMUNITY): Payer: Self-pay

## 2015-05-14 ENCOUNTER — Inpatient Hospital Stay (HOSPITAL_COMMUNITY)
Admission: EM | Admit: 2015-05-14 | Discharge: 2015-05-18 | DRG: 292 | Disposition: A | Discharge status: Home / Self Care | Payer: Medicaid Other | Attending: Cardiology | Admitting: Cardiology

## 2015-05-14 ENCOUNTER — Emergency Department (HOSPITAL_COMMUNITY): Payer: Medicaid Other

## 2015-05-14 DIAGNOSIS — I428 Other cardiomyopathies: Secondary | ICD-10-CM | POA: Diagnosis present

## 2015-05-14 DIAGNOSIS — I513 Intracardiac thrombosis, not elsewhere classified: Secondary | ICD-10-CM | POA: Diagnosis present

## 2015-05-14 DIAGNOSIS — I482 Chronic atrial fibrillation: Secondary | ICD-10-CM | POA: Diagnosis present

## 2015-05-14 DIAGNOSIS — I081 Rheumatic disorders of both mitral and tricuspid valves: Secondary | ICD-10-CM | POA: Diagnosis present

## 2015-05-14 DIAGNOSIS — I110 Hypertensive heart disease with heart failure: Secondary | ICD-10-CM | POA: Diagnosis present

## 2015-05-14 DIAGNOSIS — F410 Panic disorder [episodic paroxysmal anxiety] without agoraphobia: Secondary | ICD-10-CM | POA: Diagnosis present

## 2015-05-14 DIAGNOSIS — I252 Old myocardial infarction: Secondary | ICD-10-CM

## 2015-05-14 DIAGNOSIS — I5023 Acute on chronic systolic (congestive) heart failure: Secondary | ICD-10-CM | POA: Diagnosis present

## 2015-05-14 DIAGNOSIS — Z8673 Personal history of transient ischemic attack (TIA), and cerebral infarction without residual deficits: Secondary | ICD-10-CM

## 2015-05-14 DIAGNOSIS — I509 Heart failure, unspecified: Secondary | ICD-10-CM

## 2015-05-14 DIAGNOSIS — I4891 Unspecified atrial fibrillation: Secondary | ICD-10-CM

## 2015-05-14 DIAGNOSIS — Z9114 Patient's other noncompliance with medication regimen: Secondary | ICD-10-CM

## 2015-05-14 DIAGNOSIS — I248 Other forms of acute ischemic heart disease: Secondary | ICD-10-CM | POA: Diagnosis present

## 2015-05-14 DIAGNOSIS — F10239 Alcohol dependence with withdrawal, unspecified: Secondary | ICD-10-CM | POA: Diagnosis not present

## 2015-05-14 DIAGNOSIS — I5021 Acute systolic (congestive) heart failure: Secondary | ICD-10-CM | POA: Diagnosis present

## 2015-05-14 DIAGNOSIS — Z87891 Personal history of nicotine dependence: Secondary | ICD-10-CM | POA: Diagnosis not present

## 2015-05-14 DIAGNOSIS — J449 Chronic obstructive pulmonary disease, unspecified: Secondary | ICD-10-CM | POA: Diagnosis present

## 2015-05-14 DIAGNOSIS — R0602 Shortness of breath: Secondary | ICD-10-CM | POA: Diagnosis present

## 2015-05-14 LAB — COMPREHENSIVE METABOLIC PANEL
ALBUMIN: 3.4 g/dL — AB (ref 3.5–5.0)
ALT: 46 U/L (ref 17–63)
ALT: 47 U/L (ref 17–63)
ANION GAP: 11 (ref 5–15)
ANION GAP: 15 (ref 5–15)
AST: 42 U/L — ABNORMAL HIGH (ref 15–41)
AST: 46 U/L — AB (ref 15–41)
Albumin: 3.3 g/dL — ABNORMAL LOW (ref 3.5–5.0)
Alkaline Phosphatase: 60 U/L (ref 38–126)
Alkaline Phosphatase: 61 U/L (ref 38–126)
BILIRUBIN TOTAL: 1.8 mg/dL — AB (ref 0.3–1.2)
BUN: 15 mg/dL (ref 6–20)
BUN: 15 mg/dL (ref 6–20)
CHLORIDE: 110 mmol/L (ref 101–111)
CHLORIDE: 106 mmol/L (ref 101–111)
CO2: 19 mmol/L — ABNORMAL LOW (ref 22–32)
CO2: 21 mmol/L — ABNORMAL LOW (ref 22–32)
CREATININE: 1.25 mg/dL — AB (ref 0.61–1.24)
Calcium: 9 mg/dL (ref 8.9–10.3)
Calcium: 9.2 mg/dL (ref 8.9–10.3)
Creatinine, Ser: 1.11 mg/dL (ref 0.61–1.24)
GFR calc Af Amer: >60 mL/min (ref >60)
GFR calc non Af Amer: >60 mL/min (ref >60)
GLUCOSE: 156 mg/dL — AB (ref 65–99)
Glucose, Bld: 146 mg/dL — ABNORMAL HIGH (ref 65–99)
POTASSIUM: 4.4 mmol/L (ref 3.5–5.1)
POTASSIUM: 3.7 mmol/L (ref 3.5–5.1)
SODIUM: 142 mmol/L (ref 135–145)
Sodium: 140 mmol/L (ref 135–145)
TOTAL PROTEIN: 6.4 g/dL — AB (ref 6.5–8.1)
Total Bilirubin: 2 mg/dL — ABNORMAL HIGH (ref 0.3–1.2)
Total Protein: 6 g/dL — ABNORMAL LOW (ref 6.5–8.1)

## 2015-05-14 LAB — TROPONIN I
TROPONIN I: 0.05 ng/mL — AB (ref <0.031)
TROPONIN I: 0.05 ng/mL — AB (ref <0.031)

## 2015-05-14 LAB — URINALYSIS, ROUTINE W REFLEX MICROSCOPIC
BILIRUBIN URINE: NEGATIVE
GLUCOSE, UA: NEGATIVE mg/dL
Hgb urine dipstick: NEGATIVE
KETONES UR: NEGATIVE mg/dL
Leukocytes, UA: NEGATIVE
NITRITE: NEGATIVE
PH: 5 (ref 5.0–8.0)
Protein, ur: NEGATIVE mg/dL
SPECIFIC GRAVITY, URINE: 1.008 (ref 1.005–1.030)

## 2015-05-14 LAB — CBC WITH DIFFERENTIAL/PLATELET
Basophils Absolute: 0 10*3/uL (ref 0.0–0.1)
Basophils Relative: 1 %
Eosinophils Absolute: 0 10*3/uL (ref 0.0–0.7)
Eosinophils Relative: 1 %
HCT: 45.6 % (ref 39.0–52.0)
Hemoglobin: 15.7 g/dL (ref 13.0–17.0)
Lymphocytes Relative: 22 %
Lymphs Abs: 1.5 10*3/uL (ref 0.7–4.0)
MCH: 34.4 pg — ABNORMAL HIGH (ref 26.0–34.0)
MCHC: 34.4 g/dL (ref 30.0–36.0)
MCV: 100 fL (ref 78.0–100.0)
Monocytes Absolute: 0.7 10*3/uL (ref 0.1–1.0)
Monocytes Relative: 10 %
Neutro Abs: 4.7 10*3/uL (ref 1.7–7.7)
Neutrophils Relative %: 67 %
Platelets: 158 10*3/uL (ref 150–400)
RBC: 4.56 MIL/uL (ref 4.22–5.81)
RDW: 15 % (ref 11.5–15.5)
WBC: 7 10*3/uL (ref 4.0–10.5)

## 2015-05-14 LAB — BRAIN NATRIURETIC PEPTIDE
B NATRIURETIC PEPTIDE 5: 1076.6 pg/mL — AB (ref 0.0–100.0)
B Natriuretic Peptide: 1222.6 pg/mL — ABNORMAL HIGH (ref 0.0–100.0)

## 2015-05-14 LAB — MAGNESIUM: Magnesium: 1.4 mg/dL — ABNORMAL LOW (ref 1.7–2.4)

## 2015-05-14 LAB — TSH: TSH: 1.97 u[IU]/mL (ref 0.350–4.500)

## 2015-05-14 LAB — I-STAT TROPONIN, ED: TROPONIN I, POC: 0.03 ng/mL (ref 0.00–0.08)

## 2015-05-14 LAB — HEPARIN LEVEL (UNFRACTIONATED): HEPARIN UNFRACTIONATED: 0.32 [IU]/mL (ref 0.30–0.70)

## 2015-05-14 LAB — LIPASE, BLOOD: Lipase: 21 U/L (ref 11–51)

## 2015-05-14 MED ORDER — NITROGLYCERIN 0.4 MG SL SUBL: 0.4000 mg | SUBLINGUAL_TABLET | SUBLINGUAL | Status: DC | PRN

## 2015-05-14 MED ORDER — DILTIAZEM LOAD VIA INFUSION
20.0000 mg | Freq: Once | INTRAVENOUS | Status: AC
  Administered 2015-05-14: 20 mg via INTRAVENOUS
  Filled 2015-05-14: qty 20

## 2015-05-14 MED ORDER — FUROSEMIDE 10 MG/ML IJ SOLN
40.0000 mg | Freq: Once | INTRAMUSCULAR | Status: AC
  Administered 2015-05-14: 40 mg via INTRAVENOUS
  Filled 2015-05-14: qty 4

## 2015-05-14 MED ORDER — ASPIRIN 300 MG RE SUPP: 300.0000 mg | RECTAL | Status: AC

## 2015-05-14 MED ORDER — HEPARIN (PORCINE) IN NACL 100-0.45 UNIT/ML-% IJ SOLN
1150.0000 [IU]/h | INTRAMUSCULAR | Status: DC
  Administered 2015-05-14: 1000 [IU]/h via INTRAVENOUS
  Administered 2015-05-15: 1150 [IU]/h via INTRAVENOUS
  Filled 2015-05-14 (×2): qty 250

## 2015-05-14 MED ORDER — DEXTROSE 5 % IV SOLN
5.0000 mg/h | INTRAVENOUS | Status: DC
  Administered 2015-05-14: 10 mg/h via INTRAVENOUS
  Administered 2015-05-14: 7.5 mg/h via INTRAVENOUS
  Administered 2015-05-14: 5 mg/h via INTRAVENOUS
  Filled 2015-05-14 (×3): qty 100

## 2015-05-14 MED ORDER — ACETAMINOPHEN 325 MG PO TABS: 650.0000 mg | ORAL_TABLET | ORAL | Status: DC | PRN

## 2015-05-14 MED ORDER — ASPIRIN EC 81 MG PO TBEC
81.0000 mg | DELAYED_RELEASE_TABLET | Freq: Every day | ORAL | Status: DC
  Administered 2015-05-15: 81 mg via ORAL
  Filled 2015-05-14: qty 1

## 2015-05-14 MED ORDER — ASPIRIN 81 MG PO CHEW
324.0000 mg | CHEWABLE_TABLET | ORAL | Status: AC
  Administered 2015-05-14: 324 mg via ORAL
  Filled 2015-05-14: qty 4

## 2015-05-14 MED ORDER — ONDANSETRON HCL 4 MG/2ML IJ SOLN
4.0000 mg | Freq: Four times a day (QID) | INTRAMUSCULAR | Status: DC | PRN
  Administered 2015-05-14 – 2015-05-16 (×2): 4 mg via INTRAVENOUS
  Filled 2015-05-14 (×2): qty 2

## 2015-05-14 MED ORDER — HEPARIN BOLUS VIA INFUSION
4000.0000 [IU] | Freq: Once | INTRAVENOUS | Status: AC
  Administered 2015-05-14: 4000 [IU] via INTRAVENOUS
  Filled 2015-05-14: qty 4000

## 2015-05-14 MED ORDER — ATORVASTATIN CALCIUM 40 MG PO TABS
40.0000 mg | ORAL_TABLET | Freq: Every day | ORAL | Status: DC
  Administered 2015-05-14 – 2015-05-17 (×4): 40 mg via ORAL
  Filled 2015-05-14 (×4): qty 1

## 2015-05-14 MED ORDER — METOPROLOL TARTRATE 25 MG PO TABS
25.0000 mg | ORAL_TABLET | Freq: Two times a day (BID) | ORAL | Status: DC
  Administered 2015-05-14 – 2015-05-18 (×7): 25 mg via ORAL
  Filled 2015-05-14 (×10): qty 1

## 2015-05-14 MED ORDER — ALBUTEROL SULFATE (2.5 MG/3ML) 0.083% IN NEBU: 5.0000 mg | INHALATION_SOLUTION | Freq: Once | RESPIRATORY_TRACT | Status: DC

## 2015-05-14 MED ORDER — POTASSIUM CHLORIDE CRYS ER 20 MEQ PO TBCR
20.0000 meq | EXTENDED_RELEASE_TABLET | Freq: Every day | ORAL | Status: DC
  Administered 2015-05-14 – 2015-05-18 (×4): 20 meq via ORAL
  Filled 2015-05-14 (×5): qty 1

## 2015-05-14 MED ORDER — HEPARIN (PORCINE) IN NACL 100-0.45 UNIT/ML-% IJ SOLN: 1000.0000 [IU]/h | INTRAMUSCULAR | Status: DC

## 2015-05-14 MED ORDER — FUROSEMIDE 10 MG/ML IJ SOLN
40.0000 mg | Freq: Every day | INTRAMUSCULAR | Status: DC
  Administered 2015-05-14 – 2015-05-15 (×2): 40 mg via INTRAVENOUS
  Filled 2015-05-14 (×2): qty 4

## 2015-05-14 MED ORDER — SODIUM CHLORIDE 0.9 % IV SOLN
INTRAVENOUS | Status: DC
  Administered 2015-05-15: 19:00:00 via INTRAVENOUS

## 2015-05-14 MED ORDER — OFF THE BEAT BOOK
Freq: Once | Status: AC
  Administered 2015-05-14: 14:00:00
  Filled 2015-05-14: qty 1

## 2015-05-14 NOTE — ED Provider Notes (Addendum)
CSN: [id number]     Arrival date & time 05/14/15  0818 History   First MD Initiated Contact with Patient 05/14/15 [phone]     Chief Complaint  Patient presents with  . Shortness of Breath     (Consider location/radiation/quality/duration/timing/severity/associated sxs/prior Treatment) HPI Comments: Patient is a 50 year old male with a history of hypertension, alcohol abuse, atrial fibrillation, MI, stroke presenting to the emergency room today with 5 day history of worsening shortness of breath with any activity, nonproductive cough and tightness in the chest and abdomen. He states he's been off all medications for the last 2 months because he was depressed and wasn't keeping up with his medical issues. He continues to drink alcohol heavily and smoke. He states the last time he had any alcohol to drink was approximately 2 weeks ago. He has not been using an inhaler and denies any palpitations or lower extremity edema. He is been eating and drinking normally and denies any change in his urinary habits. 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 He also has now developed tenderness in his bilateral back but denies a fall or trauma. Pain does not radiate into the legs.  Patient is a 50 y.o. male presenting with shortness of breath. The history is provided by the patient.  Shortness of Breath Severity:  Severe Onset quality:  Gradual Duration:  5 days Timing:  Constant Progression:  Worsening Chronicity:  New Context: activity   Relieved by:  Rest Worsened by:  Activity Ineffective treatments:  None tried Associated symptoms: abdominal pain, chest pain, cough and wheezing   Associated symptoms: no fever, no hemoptysis, no sputum production and no vomiting   Associated symptoms comment:  No consistent sputum production but when he does have some it is clear. Risk factors: alcohol use and tobacco use   Risk  factors: no hx of PE/DVT and no prolonged immobilization   Risk factors comment:  MI, stroke, atrial fibrillation   Past Medical History  Diagnosis Date  . Hypertension   . Alcoholism /alcohol abuse (HCC)   . Anxiety and depression   . Tobacco abuse   . Atrial fibrillation (HCC)   . Anxiety   . Depression   . Myocardial infarction (HCC)   . Stroke (HCC)   . History of hiatal hernia   . Headache    Past Surgical History  Procedure Laterality Date  . Hernia repair      UHR x2  . Tee without cardioversion N/A 08/01/2014    Procedure: TRANSESOPHAGEAL ECHOCARDIOGRAM (TEE);  Surgeon: Orpah Cobb, MD;  Location: [facility] ENDOSCOPY;  Service: Cardiovascular;  Laterality: N/A;  . Fracture surgery      right FA   Family History  Problem Relation Age of Onset  . Heart attack Mother   . Heart attack Father   . Hypertension Mother   . Hypertension Father    Social History  Substance Use Topics  . Smoking status: Former Smoker -- 1.00 packs/day for 30 years    Types: Cigarettes    Quit date: 05/02/2014  . Smokeless tobacco: Never Used  . Alcohol Use: Yes     Comment: drank 19 40 oz beers today    Review of Systems  Constitutional: Negative for fever.  Respiratory: Positive for cough, shortness of breath and wheezing. Negative for hemoptysis and sputum production.   Cardiovascular: Positive for chest pain.  Gastrointestinal: Positive for abdominal  pain. Negative for vomiting.  All other systems reviewed and are negative.     Allergies  Bee venom  Home Medications   Prior to Admission medications   Medication Sig Start Date End Date Taking? Authorizing Provider  albuterol (PROVENTIL HFA;VENTOLIN HFA) 108 (90 BASE) MCG/ACT inhaler Inhale 2 puffs into the lungs every 6 (six) hours as needed for wheezing or shortness of breath. 07/08/14   Standley Brooking, MD  amiodarone (PACERONE) 200 MG tablet Take 2 tablets (400 mg total) by mouth 2 (two) times daily. Please take 400 mg (2  tablets) twice a day for one week, then drop down to 200 mg (one tablet) once daily Patient not taking: Reported on 10/20/2014 08/03/14   Ripudeep Jenna Luo, MD  amiodarone (PACERONE) 200 MG tablet Take 1 tablet (200 mg total) by mouth daily. 10/20/14   Jerald Kief, MD  apixaban (ELIQUIS) 5 MG TABS tablet Take 1 tablet (5 mg total) by mouth 2 (two) times daily. 08/03/14   Ripudeep Jenna Luo, MD  apixaban (ELIQUIS) 5 MG TABS tablet Take 1 tablet (5 mg total) by mouth 2 (two) times daily. 10/20/14   Jerald Kief, MD  Camphor-Eucalyptus-Menthol (MEDICATED CHEST RUB) 4.73-1.2-2.6 % OINT Apply 1 application topically daily as needed (congestion).    Historical Provider, MD  digoxin (LANOXIN) 0.25 MG tablet Take 1 tablet (0.25 mg total) by mouth daily. 10/20/14   Jerald Kief, MD  hydrOXYzine (VISTARIL) 25 MG capsule Take 25 mg by mouth 4 (four) times daily as needed for anxiety.    Historical Provider, MD  metoprolol (LOPRESSOR) 50 MG tablet Take 1 tablet (50 mg total) by mouth 2 (two) times daily. Patient not taking: Reported on 10/20/2014 08/03/14   Ripudeep Jenna Luo, MD  metoprolol (LOPRESSOR) 50 MG tablet Take 1 tablet (50 mg total) by mouth 2 (two) times daily. 10/20/14   Jerald Kief, MD  polyethylene glycol [facility] Ethelene Hal) packet Take 17 g by mouth daily.    Historical Provider, MD  sertraline (ZOLOFT) 50 MG tablet Take 50 mg by mouth daily.    Historical Provider, MD   BP 114/88 mmHg  Pulse 40  Temp(Src) 98 F (36.7 C)  Resp 22  SpO2 97% Physical Exam  Constitutional: He is oriented to person, place, and time. He appears well-developed and well-nourished. No distress.  HENT:  Head: Normocephalic and atraumatic.  Mouth/Throat: Oropharynx is clear and moist.  Eyes: Conjunctivae and EOM are normal. Pupils are equal, round, and reactive to light.  Neck: Normal range of motion. Neck supple.  Cardiovascular: Intact distal pulses.  An irregularly irregular rhythm present. Tachycardia present.   No  murmur heard. Pulmonary/Chest: Effort normal and breath sounds normal. No respiratory distress. He has no wheezes. He has no rales.  Abdominal: Soft. He exhibits no distension. There is tenderness in the right lower quadrant and left lower quadrant. There is CVA tenderness. There is no rebound and no guarding.  Musculoskeletal: Normal range of motion. He exhibits edema.       Lumbar back: He exhibits tenderness and pain. He exhibits normal range of motion, no bony tenderness and no spasm.       Back:       Arms: Trace edema in bilateral lower extremities  Neurological: He is alert and oriented to person, place, and time.  Skin: Skin is warm and dry. No rash noted. No erythema.  Psychiatric: He has a normal mood and affect. His behavior is normal.  Nursing note and vitals reviewed.   ED Course  Procedures (including critical care time) Labs Review Labs Reviewed  CBC WITH DIFFERENTIAL/PLATELET - Abnormal; Notable for the following:    MCH 34.4 (*)    All other components within normal limits  COMPREHENSIVE METABOLIC PANEL - Abnormal; Notable for the following:    CO2 19 (*)    Glucose, Bld 146 (*)    Creatinine, Ser 1.25 (*)    Total Protein 6.0 (*)    Albumin 3.3 (*)    AST 42 (*)    Total Bilirubin 2.0 (*)    All other components within normal limits  LIPASE, BLOOD  BRAIN NATRIURETIC PEPTIDE  URINALYSIS, ROUTINE W REFLEX MICROSCOPIC (NOT AT [facility])  Rosezena Sensor, ED    Imaging Review Dg Chest 2 View  05/14/2015  CLINICAL DATA:  Short of breath EXAM: CHEST  2 VIEW COMPARISON:  10/18/2014 FINDINGS: Cardiac enlargement with vascular congestion and mild interstitial edema. Tiny left effusion. Negative for pneumonia. IMPRESSION: Congestive heart failure with interstitial edema. Electronically Signed   By: Marlan Palau M.D.   On: 05/14/2015 08:54   I have personally reviewed and evaluated these images and lab results as part of my medical decision-making.   EKG  Interpretation   Date/Time:  Thursday May 14 2015 08:43:39 EDT Ventricular Rate:  150 PR Interval:    QRS Duration: 86 QT Interval:  292 QTC Calculation: 461 R Axis:   100 Text Interpretation:  Atrial fibrillation with rapid ventricular response  Rightward axis Confirmed by Anitra Lauth  MD, Alphonzo Lemmings ([id number]) on 05/14/2015  9:45:31 AM      MDM   Final diagnoses:  Atrial fibrillation with RVR (HCC)  Acute congestive heart failure, unspecified congestive heart failure type [facility])  Patient is a 50 year old male with multiple medical problems presenting today after being off all medications for the last 2-3 months with complaints of shortness of breath. Patient states he hadn't gotten his medications because he had been depressed and had kept up with his medical needs. 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 He has had a cough with minimal sputum production and the sputum he does have is clear. He has tightness in the center of his chest that he feels inhibits him from taking a deep breath and epigastric abdominal discomfort. He also states when he attempts to mother the art he is getting bilateral back pain which is nonradiating. He does drink alcohol heavily and smokes cigarettes daily. He denies any drug use.  He denies any strokelike symptoms at this time. No fever, vomiting or diarrhea. On exam patient is found to be in A. fib RVR but has fairly clear breath sounds throughout without wheezing or symptoms concerning for COPD or pneumonia. He has some lower quadrant abdominal discomfort and bilateral back pain with an unclear etiology. Will ensure patient does not have a AAA. Low suspicion for PE at this time or urinary tract infection. Low suspicion for kidney stone.  Chest x-ray consistent with congestive heart failure with interstitial edema. Troponin is negative today, CBC without acute findings. CMP pending  Will treat A. fib  RVR with a Cardizem bolus and drip.  10:38 AM Heart rate improving on the Cardizem drip now between 110-120. Patient is symptomatically feeling better. Mild AKI today with creatinine of 1.25 from a baseline of 0.8 and chest x-ray consistent with congestive heart failure.  Will continue Cardizem drip  at this time. We'll continue titrating test heart rate less than 100. Patient given Lasix 40mg  for fluid overload.  CRITICAL CARE Performed by: Gwyneth Sprout Total critical care time: 30 minutes Critical care time was exclusive of separately billable procedures and treating other patients. Critical care was necessary to treat or prevent imminent or life-threatening deterioration. Critical care was time spent personally by me on the following activities: development of treatment plan with patient and/or surrogate as well as nursing, discussions with consultants, evaluation of patient's response to treatment, examination of patient, obtaining history from patient or surrogate, ordering and performing treatments and interventions, ordering and review of laboratory studies, ordering and review of radiographic studies, pulse oximetry and re-evaluation of patient's condition.   Gwyneth Sprout, MD 05/14/15 1039  Gwyneth Sprout, MD 05/14/15 1127

## 2015-05-14 NOTE — Progress Notes (Signed)
Pt had ten beat run of Vtach. Pt asymptomatic. MD notified.

## 2015-05-14 NOTE — Progress Notes (Signed)
Report received via Victoria RN in patient's room using SBAR format, reviewed orders, labs, VS, meds and patient's general condition, assumed care of patient. 

## 2015-05-14 NOTE — H&P (Signed)
[name] is an 50 y.o. male.   Chief Complaint: Progressive increasing shortness of breath associated with palpitation HPI: Patient is 50 year old male with past medical history significant for nonischemic dilated cardiomyopathy, hypertension, history of A. fib with RVR in the past chadsvasc score of 4, anxiety disorder, history of TIA, EtOH abuse, tobacco abuse, noncompliant to medication and follow-up, and came to the ER complaining of progressive increasing shortness of breath for last for 5 days associated with pleuritic chest pain and palpitations. Patient was noted to be in A. fib with RVR received IV Cardizem bolus and was started on the drip with control of heart rate in the low 100s. Patient was started also on heparin in ER. Patient states he stopped all his medications approximately 23 months ago and started on alcohol binging. States has been drinking 9-10  40 ounce beer daily. Patient denies any anginal chest pain denies leg swelling denies syncopal episode. Denies any weakness in the arms or legs. Patient had TEE in July 2016 which showed left atrial H thrombosis and was treated with Eliquis and rate control.  Past Medical History  Diagnosis Date  . Hypertension   . Alcoholism /alcohol abuse ([place])   . Anxiety and depression   . Tobacco abuse   . Atrial fibrillation ([place])   . Anxiety   . Depression   . Myocardial infarction ([place])   . Stroke ([place])   . History of hiatal hernia   . Headache     Past Surgical History  Procedure Laterality Date  . Hernia repair      UHR x2  . Tee without cardioversion N/A 08/01/2014    Procedure: TRANSESOPHAGEAL ECHOCARDIOGRAM (TEE);  Surgeon: Dixie Dials, MD;  Location: [facility] ENDOSCOPY;  Service: Cardiovascular;  Laterality: N/A;  . Fracture surgery      right FA    Family History  Problem Relation Age of Onset  . Heart attack Mother   . Heart attack Father   . Hypertension Mother   . Hypertension Father    Social History:  reports that  he quit smoking about a year ago. His smoking use included Cigarettes. He has a 30 pack-year smoking history. He has never used smokeless tobacco. He reports that he drinks alcohol. He reports that he does not use illicit drugs.  Allergies:  Allergies  Allergen Reactions  . Bee Venom Anaphylaxis    Uses epi pen-- last used 08/23/11     (Not in a hospital admission)  Results for orders placed or performed during the hospital encounter of 05/14/15 (from the past 48 hour(s))  CBC with Differential     Status: Abnormal   Collection Time: 05/14/15  8:43 AM  Result Value Ref Range   WBC 7.0 4.0 - 10.5 K/uL   RBC 4.56 4.22 - 5.81 MIL/uL   Hemoglobin 15.7 13.0 - 17.0 g/dL   HCT 45.6 39.0 - 52.0 %   MCV 100.0 78.0 - 100.0 fL   MCH 34.4 (H) 26.0 - 34.0 pg   MCHC 34.4 30.0 - 36.0 g/dL   RDW 15.0 11.5 - 15.5 %   Platelets 158 150 - 400 K/uL   Neutrophils Relative % 67 %   Neutro Abs 4.7 1.7 - 7.7 K/uL   Lymphocytes Relative 22 %   Lymphs Abs 1.5 0.7 - 4.0 K/uL   Monocytes Relative 10 %   Monocytes Absolute 0.7 0.1 - 1.0 K/uL   Eosinophils Relative 1 %   Eosinophils Absolute 0.0 0.0 - 0.7 K/uL  Basophils Relative 1 %   Basophils Absolute 0.0 0.0 - 0.1 K/uL  Comprehensive metabolic panel     Status: Abnormal   Collection Time: 05/14/15  8:43 AM  Result Value Ref Range   Sodium 140 135 - 145 mmol/L   Potassium 4.4 3.5 - 5.1 mmol/L   Chloride 110 101 - 111 mmol/L   CO2 19 (L) 22 - 32 mmol/L   Glucose, Bld 146 (H) 65 - 99 mg/dL   BUN 15 6 - 20 mg/dL   Creatinine, Ser 1.25 (H) 0.61 - 1.24 mg/dL   Calcium 9.0 8.9 - 10.3 mg/dL   Total Protein 6.0 (L) 6.5 - 8.1 g/dL   Albumin 3.3 (L) 3.5 - 5.0 g/dL   AST 42 (H) 15 - 41 U/L   ALT 46 17 - 63 U/L   Alkaline Phosphatase 60 38 - 126 U/L   Total Bilirubin 2.0 (H) 0.3 - 1.2 mg/dL   GFR calc non Af Amer >60 >60 mL/min   GFR calc Af Amer >60 >60 mL/min    Comment: (NOTE) The eGFR has been calculated using the CKD EPI equation. This  calculation has not been validated in all clinical situations. eGFR's persistently <60 mL/min signify possible Chronic Kidney Disease.    Anion gap 11 5 - 15  Lipase, blood     Status: None   Collection Time: 05/14/15  8:43 AM  Result Value Ref Range   Lipase 21 11 - 51 U/L  Brain natriuretic peptide     Status: Abnormal   Collection Time: 05/14/15  8:43 AM  Result Value Ref Range   B Natriuretic Peptide 1222.6 (H) 0.0 - 100.0 pg/mL  I-Stat Troponin, ED (not at [facility])     Status: None   Collection Time: 05/14/15  9:09 AM  Result Value Ref Range   Troponin i, poc 0.03 0.00 - 0.08 ng/mL   Comment 3            Comment: Due to the release kinetics of cTnI, a negative result within the first hours of the onset of symptoms does not rule out myocardial infarction with certainty. If myocardial infarction is still suspected, repeat the test at appropriate intervals.    Dg Chest 2 View  05/14/2015  CLINICAL DATA:  Short of breath EXAM: CHEST  2 VIEW COMPARISON:  10/18/2014 FINDINGS: Cardiac enlargement with vascular congestion and mild interstitial edema. Tiny left effusion. Negative for pneumonia. IMPRESSION: Congestive heart failure with interstitial edema. Electronically Signed   By: Franchot Gallo M.D.   On: 05/14/2015 08:54    Review of Systems  Constitutional: Negative for fever and chills.  Eyes: Negative for photophobia.  Respiratory: Positive for shortness of breath. Negative for hemoptysis.   Cardiovascular: Positive for chest pain and palpitations.  Genitourinary: Negative for dysuria.  Neurological: Positive for dizziness. Negative for headaches.    Blood pressure 123/109, pulse 40, temperature 98 F (36.7 C), resp. rate 33, weight 77.111 kg (170 lb), SpO2 96 %. Physical Exam  Constitutional: He is oriented to person, place, and time.  HENT:  Head: Normocephalic and atraumatic.  Eyes: Conjunctivae are normal. Pupils are equal, round, and reactive to light. Left eye  exhibits no discharge. No scleral icterus.  Neck: Normal range of motion. Neck supple. JVD present. No thyromegaly present.  Cardiovascular:  Irregularly irregular tachycardic H6-D1 soft Soft systolic murmur and S3 gallop noted  Respiratory:  Decreased breath sound at bases with bibasilar faint rales  GI: Soft. Bowel sounds are  normal. He exhibits no distension. There is no tenderness. There is no rebound.  Musculoskeletal: He exhibits no edema or tenderness.  Neurological: He is alert and oriented to person, place, and time.     Assessment/Plan A. fib with RVR Acute decompensated systolic heart failure Atypical chest pain rule out MI Hypertension History of TIA in the past EtOH abuse Tobacco abuse Anxiety disorder Plan As per orders Discussed with patient regarding TEE and possible cardioversion if no evidence of left atrial appendage thrombus its risk and benefits, and consents for it.  Charolette Forward, MD 05/14/2015, 12:20 PM

## 2015-05-14 NOTE — ED Notes (Signed)
Lab to add on BNP and lipase.

## 2015-05-14 NOTE — Progress Notes (Signed)
Pt complaining of palpitations, RN arrived to the room pt having an anxiety attack. RN educated pt on breathing and calming techniques, and applied 2l of 02 for comfort.

## 2015-05-14 NOTE — Progress Notes (Signed)
ANTICOAGULATION CONSULT NOTE - Initial Consult  Pharmacy Consult for heparin Indication: atrial fibrillation  Allergies  Allergen Reactions  . Bee Venom Anaphylaxis    Uses epi pen-- last used 08/23/11    Patient Measurements: Weight: 170 lb (77.111 kg) Heparin Dosing Weight: 77kg  Vital Signs: Temp: 98 F (36.7 C) (04/13 0836) BP: 123/109 mmHg (04/13 1100) Pulse Rate: 40 (04/13 0836)  Labs:  Recent Labs  05/14/15 0843  HGB 15.7  HCT 45.6  PLT 158  CREATININE 1.25*    CrCl cannot be calculated (Unknown ideal weight.).   Assessment: 51 YOM here with admitted noncompliance with medications who felt chest pain and his heart beating out of his chest.  He was on Eliquis 5mg  BID in the past, but states he has not taken it for about a month. Patient tells me this episode has scared him into taking his medications and he wants to do what is right so his fiancee doesn't get mad at him again for not taking care of himself.  No bleeding or bruising noted by patient PTA. Baseline hgb 15.7, plts 158.  Goal of Therapy:  Heparin level 0.3-0.7 units/ml Monitor platelets by anticoagulation protocol: Yes   Plan:   -heparin 4000 unit bolus IV x1 followed by drip at rate of 1000 units/hr -heparin level in 6h -daily HL and CBC -follow for s/s bleeding as well as cardiology plans -follow long term AC plans  Nana Vastine D. Ger Ringenberg, PharmD, BCPS Clinical Pharmacist Pager: [phone] 05/14/2015 11:47 AM

## 2015-05-14 NOTE — ED Notes (Signed)
Patient here with increasing shortness of breath x 5 days, smoker. Reports pain in ribs, productive cough

## 2015-05-14 NOTE — Progress Notes (Signed)
Report given to [facility] RN using [employer], reviewed orders, labs, VS, meds and patient's general condition, will transfer care to her in stable condition, VSS and no c/o pain

## 2015-05-14 NOTE — Progress Notes (Signed)
ANTICOAGULATION CONSULT NOTE  Pharmacy Consult for heparin Indication: atrial fibrillation  Allergies  Allergen Reactions  . Bee Venom Anaphylaxis    Uses epi pen-- last used 08/23/11    Patient Measurements: Height: 5\' 6"  (167.6 cm) Weight: 181 lb 6.4 oz (82.283 kg) IBW/kg (Calculated) : 63.8 Heparin Dosing Weight: 77kg  Vital Signs: Temp: 98 F (36.7 C) (04/13 1346) Temp Source: Oral (04/13 1346) BP: 100/80 mmHg (04/13 1805) Pulse Rate: 98 (04/13 1805)  Labs:  Recent Labs  05/14/15 0843 05/14/15 1446 05/14/15 1811  HGB 15.7  --   --   HCT 45.6  --   --   PLT 158  --   --   HEPARINUNFRC  --   --  0.32  CREATININE 1.25* 1.11  --   TROPONINI  --  0.05*  --     Estimated Creatinine Clearance: 80.2 mL/min (by C-G formula based on Cr of 1.11).   Assessment: 69 YOM here with admitted noncompliance with medications and noted with increased SOB and CP. He is on heparin for afib and the initial heparin level is at goal (HL= 0.32)  He was on Eliquis 5mg  BID in the past, but states he has not taken it for about a month.   Goal of Therapy:  Heparin level 0.3-0.7 units/ml Monitor platelets by anticoagulation protocol: Yes   Plan:   -No heparin changes needed -Daily heparin level and CBC  Harland German, Pharm D 05/14/2015 7:12 PM

## 2015-05-15 ENCOUNTER — Encounter (HOSPITAL_COMMUNITY)
Admission: EM | Disposition: A | Discharge status: Home / Self Care | Payer: Self-pay | Source: Home / Self Care | Attending: Cardiology

## 2015-05-15 ENCOUNTER — Inpatient Hospital Stay (HOSPITAL_COMMUNITY): Payer: Medicaid Other | Admitting: Certified Registered Nurse Anesthetist

## 2015-05-15 ENCOUNTER — Encounter (HOSPITAL_COMMUNITY): Payer: Self-pay

## 2015-05-15 ENCOUNTER — Inpatient Hospital Stay (HOSPITAL_COMMUNITY): Payer: Medicaid Other

## 2015-05-15 HISTORY — PX: TEE WITHOUT CARDIOVERSION: SHX5443

## 2015-05-15 LAB — CBC
HCT: 45 % (ref 39.0–52.0)
HEMOGLOBIN: 15.1 g/dL (ref 13.0–17.0)
MCH: 33.6 pg (ref 26.0–34.0)
MCHC: 33.6 g/dL (ref 30.0–36.0)
MCV: 100 fL (ref 78.0–100.0)
Platelets: 130 10*3/uL — ABNORMAL LOW (ref 150–400)
RBC: 4.5 MIL/uL (ref 4.22–5.81)
RDW: 15 % (ref 11.5–15.5)
WBC: 8 10*3/uL (ref 4.0–10.5)

## 2015-05-15 LAB — LIPID PANEL
CHOL/HDL RATIO: 3.1 ratio
CHOLESTEROL: 137 mg/dL (ref 0–200)
HDL: 44 mg/dL (ref >40)
LDL Cholesterol: 83 mg/dL (ref 0–99)
Triglycerides: 48 mg/dL (ref <150)
VLDL: 10 mg/dL (ref 0–40)

## 2015-05-15 LAB — HEMOGLOBIN A1C
Hgb A1c MFr Bld: 5.4 % (ref 4.8–5.6)
Mean Plasma Glucose: 108 mg/dL

## 2015-05-15 LAB — BASIC METABOLIC PANEL
ANION GAP: 13 (ref 5–15)
BUN: 21 mg/dL — AB (ref 6–20)
CHLORIDE: 106 mmol/L (ref 101–111)
CO2: 19 mmol/L — ABNORMAL LOW (ref 22–32)
Calcium: 9 mg/dL (ref 8.9–10.3)
Creatinine, Ser: 1.61 mg/dL — ABNORMAL HIGH (ref 0.61–1.24)
GFR calc Af Amer: 56 mL/min — ABNORMAL LOW (ref >60)
GFR calc non Af Amer: 48 mL/min — ABNORMAL LOW (ref >60)
GLUCOSE: 102 mg/dL — AB (ref 65–99)
POTASSIUM: 4.5 mmol/L (ref 3.5–5.1)
Sodium: 138 mmol/L (ref 135–145)

## 2015-05-15 LAB — HEPARIN LEVEL (UNFRACTIONATED)
Heparin Unfractionated: 0.26 IU/mL — ABNORMAL LOW (ref 0.30–0.70)
Heparin Unfractionated: 0.42 IU/mL (ref 0.30–0.70)

## 2015-05-15 LAB — TROPONIN I: Troponin I: 0.05 ng/mL — ABNORMAL HIGH (ref <0.031)

## 2015-05-15 SURGERY — ECHOCARDIOGRAM, TRANSESOPHAGEAL
Anesthesia: Monitor Anesthesia Care

## 2015-05-15 MED ORDER — MEPERIDINE HCL 25 MG/ML IJ SOLN: 6.2500 mg | INTRAMUSCULAR | Status: DC | PRN

## 2015-05-15 MED ORDER — LACTATED RINGERS IV SOLN
INTRAVENOUS | Status: DC
  Administered 2015-05-15: 1000 mL via INTRAVENOUS
  Administered 2015-05-15: 14:00:00 via INTRAVENOUS

## 2015-05-15 MED ORDER — MIDAZOLAM HCL 2 MG/2ML IJ SOLN: 0.5000 mg | Freq: Once | INTRAMUSCULAR | Status: DC | PRN

## 2015-05-15 MED ORDER — APIXABAN 5 MG PO TABS
5.0000 mg | ORAL_TABLET | Freq: Two times a day (BID) | ORAL | Status: DC
  Administered 2015-05-15 – 2015-05-18 (×6): 5 mg via ORAL
  Filled 2015-05-15 (×7): qty 1

## 2015-05-15 MED ORDER — PHENYLEPHRINE HCL 10 MG/ML IJ SOLN
INTRAMUSCULAR | Status: DC | PRN
  Administered 2015-05-15 (×2): 80 ug via INTRAVENOUS

## 2015-05-15 MED ORDER — SODIUM CHLORIDE 0.9 % IV SOLN: INTRAVENOUS | Status: DC

## 2015-05-15 MED ORDER — MAGNESIUM SULFATE 4 GM/100ML IV SOLN
4.0000 g | Freq: Once | INTRAVENOUS | Status: AC
  Administered 2015-05-15: 4 g via INTRAVENOUS
  Filled 2015-05-15: qty 100

## 2015-05-15 MED ORDER — BUTAMBEN-TETRACAINE-BENZOCAINE 2-2-14 % EX AERO
INHALATION_SPRAY | CUTANEOUS | Status: DC | PRN
  Administered 2015-05-15: 2 via TOPICAL

## 2015-05-15 MED ORDER — PROPOFOL 500 MG/50ML IV EMUL
INTRAVENOUS | Status: DC | PRN
  Administered 2015-05-15: 100 ug/kg/min via INTRAVENOUS

## 2015-05-15 MED ORDER — MIDAZOLAM HCL 5 MG/5ML IJ SOLN
INTRAMUSCULAR | Status: DC | PRN
  Administered 2015-05-15 (×2): 2 mg via INTRAVENOUS

## 2015-05-15 NOTE — Anesthesia Postprocedure Evaluation (Signed)
Anesthesia Post Note  Patient: [name]  Procedure(s) Performed: Procedure(s) (LRB): TRANSESOPHAGEAL ECHOCARDIOGRAM (TEE) (N/A)  Patient location during evaluation: Endoscopy Anesthesia Type: MAC Level of consciousness: awake and alert, oriented and patient cooperative Pain management: pain level controlled Vital Signs Assessment: post-procedure vital signs reviewed and stable Respiratory status: nonlabored ventilation, spontaneous breathing and respiratory function stable Cardiovascular status: blood pressure returned to baseline and stable Postop Assessment: no signs of nausea or vomiting Anesthetic complications: no    Last Vitals:  Filed Vitals:   05/15/15 1420 05/15/15 1430  BP: 94/70 104/75  Pulse: 86 86  Temp:    Resp: 26 28    Last Pain:  Filed Vitals:   05/15/15 1433  PainSc: 0-No pain                 Dorianna Mckiver,E. Annalysse Shoemaker

## 2015-05-15 NOTE — Anesthesia Preprocedure Evaluation (Addendum)
Anesthesia Evaluation  Patient identified by MRN, date of birth, ID band Patient awake    Reviewed: Allergy & Precautions, NPO status , Patient's Chart, lab work & pertinent test results  History of Anesthesia Complications Negative for: history of anesthetic complications  Airway Mallampati: II  TM Distance: >3 FB Neck ROM: Full    Dental  (+) Poor Dentition, Missing, Dental Advisory Given, Chipped   Pulmonary COPD,  COPD inhaler, former smoker (quit 2016),    breath sounds clear to auscultation       Cardiovascular hypertension, Pt. on medications and Pt. on home beta blockers +CHF  + dysrhythmias Atrial Fibrillation  Rhythm:Irregular Rate:Normal  '16 ECHO: EF 35-40%, mod-severe MR, mild-mod TR   Neuro/Psych Anxiety Depression TIA   GI/Hepatic negative GI ROS, (+)     substance abuse  alcohol use,   Endo/Other  negative endocrine ROS  Renal/GU Renal InsufficiencyRenal disease (creat 1.61)     Musculoskeletal   Abdominal   Peds  Hematology  (+) Blood dyscrasia (eliquis), ,   Anesthesia Other Findings   Reproductive/Obstetrics                            Anesthesia Physical Anesthesia Plan  ASA: III  Anesthesia Plan: MAC   Post-op Pain Management:    Induction: Intravenous  Airway Management Planned: Natural Airway and Nasal Cannula  Additional Equipment:   Intra-op Plan:   Post-operative Plan:   Informed Consent: I have reviewed the patients History and Physical, chart, labs and discussed the procedure including the risks, benefits and alternatives for the proposed anesthesia with the patient or authorized representative who has indicated his/her understanding and acceptance.   Dental advisory given  Plan Discussed with: CRNA and Surgeon  Anesthesia Plan Comments: (Plan routine monitors, MAC)        Anesthesia Quick Evaluation

## 2015-05-15 NOTE — Progress Notes (Signed)
  Echocardiogram Echocardiogram Transesophageal has been performed.  Delcie Roch 05/15/2015, 2:28 PM

## 2015-05-15 NOTE — Progress Notes (Signed)
Subjective:  Patient denies any chest pain or shortness of breath states overall feeling better. Remains in atrial fibrillation with controlled ventricular response. Scheduled for TEE cardioversion later today by Dr. Algie Coffer.    Objective:  Vital Signs in the last 24 hours: Temp:  [98 F (36.7 C)-98.6 F (37 C)] 98.6 F (37 C) (04/14 0414) Pulse Rate:  [28-112] 100 (04/14 0500) Resp:  [10-40] 24 (04/14 0500) BP: (84-134)/(67-109) 97/79 mmHg (04/14 0400) SpO2:  [73 %-98 %] 92 % (04/14 0500) Weight:  [77.111 kg (170 lb)-82.373 kg (181 lb 9.6 oz)] 82.373 kg (181 lb 9.6 oz) (04/14 0414)  Intake/Output from previous day: 04/13 0701 - 04/14 0700 In: 1652 [P.O.:1440; I.V.:212] Out: 1200 [Urine:1200] Intake/Output from this shift:    Physical Exam: Neck: no adenopathy, no carotid bruit, no JVD and supple, symmetrical, trachea midline Lungs: clear to auscultation bilaterally Heart: irregularly irregular rhythm, S1, S2 normal and Soft systolic murmur noted Abdomen: soft, non-tender; bowel sounds normal; no masses,  no organomegaly Extremities: extremities normal, atraumatic, no cyanosis or edema  Lab Results:  Recent Labs  05/14/15 0843 05/15/15 0207  WBC 7.0 8.0  HGB 15.7 15.1  PLT 158 130*    Recent Labs  05/14/15 1446 05/15/15 0207  NA 142 138  K 3.7 4.5  CL 106 106  CO2 21* 19*  GLUCOSE 156* 102*  BUN 15 21*  CREATININE 1.11 1.61*    Recent Labs  05/14/15 1811 05/15/15 0207  TROPONINI 0.05* 0.05*   Hepatic Function Panel  Recent Labs  05/14/15 1446  PROT 6.4*  ALBUMIN 3.4*  AST 46*  ALT 47  ALKPHOS 61  BILITOT 1.8*    Recent Labs  05/15/15 0207  CHOL 137   No results for input(s): PROTIME in the last 72 hours.  Imaging: Imaging results have been reviewed and Dg Chest 2 View  05/14/2015  CLINICAL DATA:  Short of breath EXAM: CHEST  2 VIEW COMPARISON:  10/18/2014 FINDINGS: Cardiac enlargement with vascular congestion and mild interstitial  edema. Tiny left effusion. Negative for pneumonia. IMPRESSION: Congestive heart failure with interstitial edema. Electronically Signed   By: Marlan Palau M.D.   On: 05/14/2015 08:54    Cardiac Studies:  Assessment/Plan:  A. fib with RVR chadsvas score of 4 Resolving Acute decompensated systolic heart failure Minimally elevated troponin I secondary to about doubt significant MI Status post Atypical chest pain MI ruled out Hypertension History of TIA in the past EtOH abuse Tobacco abuse Anxiety disorder Hypomagnesemia Plan Replace mg. Schedule for TEE cardioversion today Patient will need chronic anticoagulation  Dr. Algie Coffer on-call for me for weekend  LOS: 1 day    [name] 05/15/2015, 8:44 AM

## 2015-05-15 NOTE — CV Procedure (Signed)
INDICATIONS:   The patient is 50 yr old male with atrial fibrillation and LV systolic dysfunction.  PROCEDURE:  Informed consent was discussed including risks, benefits and alternatives for the procedure.  Risks include, but are not limited to, cough, sore throat, vomiting, nausea, somnolence, esophageal and stomach trauma or perforation, bleeding, low blood pressure, aspiration, pneumonia, infection, trauma to the teeth and death.    Patient was given sedation.  The oropharynx was anesthetized with topical lidocaine.  The transesophageal probe was inserted in the esophagus and stomach and multiple views were obtained.  Agitated saline was used after the transesophageal probe was removed from the body.  The patient was kept under observation until the patient left the procedure room.  The patient left the procedure room in stable condition.   COMPLICATIONS:  There were no immediate complications.  FINDINGS:  1. LEFT VENTRICLE: The left ventricle is normal in structure and moderate generalized hypokinesia with 25-30 % EF.  No thrombus or masses seen in the left ventricle.  2. RIGHT VENTRICLE:  The right ventricle is normal in structure and function without any thrombus or masses.    3. LEFT ATRIUM:  The left atrium is dilated with "smoke" in cavity.  4. LEFT ATRIAL APPENDAGE:  The left atrial appendage has small thrombus at tip.  5. RIGHT ATRIUM:  The right atrium is free of any thrombus or masses.    6. ATRIAL SEPTUM:  The atrial septum is normal without any ASD or PFO.  7. MITRAL VALVE:  The mitral valve is normal in structure and function with multiple jets of moderate to severe  Regurgitation. No masses, stenosis or vegetations.  8. TRICUSPID VALVE:  The tricuspid valve is normal in structure and function with moderate eccentric jet of regurgitation. No masses, stenosis or vegetations.  9. AORTIC VALVE:  The aortic valve is normal in structure and function without regurgitation, masses,  stenosis or vegetations.   10. PULMONIC VALVE:  The pulmonic valve is normal in structure and function without regurgitation, masses, stenosis or vegetations.  11. AORTIC ARCH, ASCENDING AND DESCENDING AORTA:  The aorta had mild atherosclerosis in the ascending or descending aorta.  The aortic arch was normal.  IMPRESSION:   1. Moderate to severe LV systolic dysfunction with 25-30 % EF. 2. Moderate to severe MR. 3. Moderate TR. 4. Small thrombus at tip of atrial appendage  RECOMMENDATIONS:    Medical treatment with heparin/Coumadin.

## 2015-05-15 NOTE — Transfer of Care (Signed)
Immediate Anesthesia Transfer of Care Note  Patient: [name]  Procedure(s) Performed: Procedure(s): TRANSESOPHAGEAL ECHOCARDIOGRAM (TEE) (N/A)  Patient Location: Endoscopy Unit  Anesthesia Type:MAC  Level of Consciousness: sedated  Airway & Oxygen Therapy: Patient Spontanous Breathing and Patient connected to nasal cannula oxygen  Post-op Assessment: Report given to RN and Post -op Vital signs reviewed and stable  Post vital signs: Reviewed and stable  Last Vitals:  Filed Vitals:   05/15/15 1250 05/15/15 1418  BP: 114/85 92/56  Pulse:  77  Temp: 36.8 C 36.3 C  Resp: 20 26    Complications: No apparent anesthesia complications

## 2015-05-15 NOTE — Interval H&P Note (Signed)
History and Physical Interval Note:  05/15/2015 1:40 PM  [name]  has presented today for surgery, with the diagnosis of a flutter  The various methods of treatment have been discussed with the patient and family. After consideration of risks, benefits and other options for treatment, the patient has consented to  Procedure(s): TRANSESOPHAGEAL ECHOCARDIOGRAM (TEE) (N/A) CARDIOVERSION (N/A) as a surgical intervention .  The patient's history has been reviewed, patient examined, no change in status, stable for surgery.  I have reviewed the patient's chart and labs.  Questions were answered to the patient's satisfaction.     Basel Defalco S

## 2015-05-15 NOTE — Progress Notes (Addendum)
ANTICOAGULATION CONSULT NOTE - Follow Up Consult  Pharmacy Consult for Heparin Indication: atrial fibrillation  Allergies  Allergen Reactions  . Bee Venom Anaphylaxis    Uses epi pen-- last used 08/23/11    Patient Measurements: Height: 5\' 6"  (167.6 cm) Weight: 181 lb 9.6 oz (82.373 kg) IBW/kg (Calculated) : 63.8 Heparin Dosing Weight: 80 kg  Vital Signs: Temp: 97.4 F (36.3 C) (04/14 1418) Temp Source: Oral (04/14 1418) BP: 135/118 mmHg (04/14 1515) Pulse Rate: 89 (04/14 1515)  Labs:  Recent Labs  05/14/15 0843 05/14/15 1446 05/14/15 1811 05/15/15 0207 05/15/15 1615  HGB 15.7  --   --  15.1  --   HCT 45.6  --   --  45.0  --   PLT 158  --   --  130*  --   HEPARINUNFRC  --   --  0.32 0.26* 0.42  CREATININE 1.25* 1.11  --  1.61*  --   TROPONINI  --  0.05* 0.05* 0.05*  --     Estimated Creatinine Clearance: 55.3 mL/min (by C-G formula based on Cr of 1.61).   Medications:  Scheduled:  . aspirin EC  81 mg Oral Daily  . atorvastatin  40 mg Oral q1800  . furosemide  40 mg Intravenous Daily  . metoprolol  25 mg Oral BID  . potassium chloride  20 mEq Oral Daily   Infusions:  . sodium chloride    . diltiazem (CARDIZEM) infusion 5 mg/hr (05/15/15 0900)  . heparin 1,150 Units/hr (05/15/15 1020)    Assessment: 50 yo M with recurrent afib started on heparin.  Heparin level is at goal (HL= 0.42) on 1150 units/hr. Noted s/p TEE with small thrombus at tip of atrial appendage.  Goal of Therapy:  Heparin level 0.3-0.7 units/ml Monitor platelets by anticoagulation protocol: Yes   Plan:  -No heparin changes needed -Daily heparin level and CBC -Consider beginning oral anticoagulation?  Harland German, Pharm D 05/15/2015 5:34 PM  Addendum -changing to Eliquis  -Due to patient compliance concerns, Dr. Algie Coffer prefers to use coumadin  Plan -apixiban 5mg  po bid  Harland German, Pharm D 05/15/2015 5:51 PM

## 2015-05-15 NOTE — Progress Notes (Signed)
ANTICOAGULATION CONSULT NOTE - Follow Up Consult  Pharmacy Consult for Heparin Indication: atrial fibrillation  Allergies  Allergen Reactions  . Bee Venom Anaphylaxis    Uses epi pen-- last used 08/23/11    Patient Measurements: Height: 5\' 6"  (167.6 cm) Weight: 181 lb 9.6 oz (82.373 kg) IBW/kg (Calculated) : 63.8 Heparin Dosing Weight: 80 kg  Vital Signs: Temp: 98.6 F (37 C) (04/14 0414) Temp Source: Oral (04/14 0414) BP: 112/90 mmHg (04/14 0900) Pulse Rate: 52 (04/14 0900)  Labs:  Recent Labs  05/14/15 0843 05/14/15 1446 05/14/15 1811 05/15/15 0207  HGB 15.7  --   --  15.1  HCT 45.6  --   --  45.0  PLT 158  --   --  130*  HEPARINUNFRC  --   --  0.32 0.26*  CREATININE 1.25* 1.11  --  1.61*  TROPONINI  --  0.05* 0.05* 0.05*    Estimated Creatinine Clearance: 55.3 mL/min (by C-G formula based on Cr of 1.61).   Medications:  Scheduled:  . aspirin EC  81 mg Oral Daily  . atorvastatin  40 mg Oral q1800  . furosemide  40 mg Intravenous Daily  . magnesium sulfate 1 - 4 g bolus IVPB  4 g Intravenous Once  . metoprolol  25 mg Oral BID  . potassium chloride  20 mEq Oral Daily   Infusions:  . sodium chloride    . sodium chloride    . diltiazem (CARDIZEM) infusion 5 mg/hr (05/15/15 0900)  . heparin 1,000 Units/hr (05/14/15 1900)    Assessment: 50 yo M with recurrent afib started on heparin.  Heparin level is subtherapeutic.on 1000 units/hr.  Will increase accordingly.  Noted plans for TEE/DCCV this afternoon.  Goal of Therapy:  Heparin level 0.3-0.7 units/ml Monitor platelets by anticoagulation protocol: Yes   Plan:  Increase heparin to 1150 units/hr Repeat heparin level in 6 hours Follow-up plans for PO AC  Farrah Skoda, Pharm.D., BCPS Clinical Pharmacist Pager [phone] 05/15/2015 10:03 AM

## 2015-05-15 NOTE — Progress Notes (Signed)
Utilization review complete. Tatsuya Okray RN CCM Case Mgmt phone [phone] 

## 2015-05-16 LAB — CBC
HEMATOCRIT: 45.1 % (ref 39.0–52.0)
HEMOGLOBIN: 15 g/dL (ref 13.0–17.0)
MCH: 33 pg (ref 26.0–34.0)
MCHC: 33.3 g/dL (ref 30.0–36.0)
MCV: 99.1 fL (ref 78.0–100.0)
Platelets: 138 10*3/uL — ABNORMAL LOW (ref 150–400)
RBC: 4.55 MIL/uL (ref 4.22–5.81)
RDW: 14.6 % (ref 11.5–15.5)
WBC: 6.6 10*3/uL (ref 4.0–10.5)

## 2015-05-16 LAB — BASIC METABOLIC PANEL
ANION GAP: 13 (ref 5–15)
BUN: 24 mg/dL — ABNORMAL HIGH (ref 6–20)
CHLORIDE: 100 mmol/L — AB (ref 101–111)
CO2: 26 mmol/L (ref 22–32)
Calcium: 9 mg/dL (ref 8.9–10.3)
Creatinine, Ser: 1.42 mg/dL — ABNORMAL HIGH (ref 0.61–1.24)
GFR calc non Af Amer: 56 mL/min — ABNORMAL LOW (ref >60)
Glucose, Bld: 112 mg/dL — ABNORMAL HIGH (ref 65–99)
Potassium: 4.1 mmol/L (ref 3.5–5.1)
Sodium: 139 mmol/L (ref 135–145)

## 2015-05-16 LAB — MAGNESIUM: Magnesium: 1.8 mg/dL (ref 1.7–2.4)

## 2015-05-16 MED ORDER — DILTIAZEM HCL ER COATED BEADS 120 MG PO CP24
120.0000 mg | ORAL_CAPSULE | Freq: Every day | ORAL | Status: DC
  Administered 2015-05-16 – 2015-05-18 (×3): 120 mg via ORAL
  Filled 2015-05-16 (×3): qty 1

## 2015-05-16 MED ORDER — ALUM & MAG HYDROXIDE-SIMETH 200-200-20 MG/5ML PO SUSP
30.0000 mL | Freq: Four times a day (QID) | ORAL | Status: DC | PRN
  Administered 2015-05-16: 30 mL via ORAL
  Filled 2015-05-16: qty 30

## 2015-05-16 MED ORDER — FUROSEMIDE 20 MG PO TABS: 20.0000 mg | ORAL_TABLET | Freq: Every day | ORAL | Status: DC

## 2015-05-16 MED ORDER — LORAZEPAM 1 MG PO TABS
1.0000 mg | ORAL_TABLET | Freq: Four times a day (QID) | ORAL | Status: DC | PRN
  Administered 2015-05-16 – 2015-05-17 (×4): 1 mg via ORAL
  Filled 2015-05-16 (×4): qty 1

## 2015-05-16 MED ORDER — FUROSEMIDE 20 MG PO TABS
20.0000 mg | ORAL_TABLET | Freq: Every day | ORAL | Status: DC
  Administered 2015-05-16 – 2015-05-18 (×3): 20 mg via ORAL
  Filled 2015-05-16 (×3): qty 1

## 2015-05-16 MED ORDER — ALPRAZOLAM 0.25 MG PO TABS
0.2500 mg | ORAL_TABLET | Freq: Two times a day (BID) | ORAL | Status: DC
  Administered 2015-05-16: 0.25 mg via ORAL
  Filled 2015-05-16: qty 1

## 2015-05-16 NOTE — Progress Notes (Signed)
Ref: PLACEY,MARY H, NP   Subjective:  Having panic attacks. Afebrile. Lower blood pressure post diuresis.  Objective:  Vital Signs in the last 24 hours: Temp:  [97.4 F (36.3 C)-98.3 F (36.8 C)] 97.7 F (36.5 C) (04/15 0419) Pulse Rate:  [33-101] 101 (04/15 0004) Cardiac Rhythm:  [-] Atrial fibrillation (04/14 2135) Resp:  [9-34] 25 (04/15 0600) BP: (91-135)/(56-118) 91/60 mmHg (04/15 0600) SpO2:  [88 %-100 %] 98 % (04/15 0600) Weight:  [83.28 kg (183 lb 9.6 oz)] 83.28 kg (183 lb 9.6 oz) (04/15 0419)  Physical Exam: BP Readings from Last 1 Encounters:  05/16/15 91/60    Wt Readings from Last 1 Encounters:  05/16/15 83.28 kg (183 lb 9.6 oz)    Weight change: 6.169 kg (13 lb 9.6 oz)  HEENT: [place]/AT, Eyes- PERL, EOMI, Conjunctiva-Pink, Sclera-Non-icteric Neck: No JVD, No bruit, Trachea midline. Lungs:  Clear, Bilateral. Cardiac:  Irregular rhythm, normal S1 and S2, no S3. III/VI systolic murmur. Abdomen:  Soft, non-tender. Extremities:  No edema present. No cyanosis. No clubbing. CNS: AxOx3, Cranial nerves grossly intact, moves all 4 extremities. Right handed. Skin: Warm and dry.   Intake/Output from previous day: 04/14 0701 - 04/15 0700 In: 2454.8 [P.O.:1680; I.V.:774.8] Out: 3300 [Urine:3300]    Lab Results: BMET    Component Value Date/Time   NA 139 05/16/2015 0429   NA 138 05/15/2015 0207   NA 142 05/14/2015 1446   K 4.1 05/16/2015 0429   K 4.5 05/15/2015 0207   K 3.7 05/14/2015 1446   CL 100* 05/16/2015 0429   CL 106 05/15/2015 0207   CL 106 05/14/2015 1446   CO2 26 05/16/2015 0429   CO2 19* 05/15/2015 0207   CO2 21* 05/14/2015 1446   GLUCOSE 112* 05/16/2015 0429   GLUCOSE 102* 05/15/2015 0207   GLUCOSE 156* 05/14/2015 1446   BUN 24* 05/16/2015 0429   BUN 21* 05/15/2015 0207   BUN 15 05/14/2015 1446   CREATININE 1.42* 05/16/2015 0429   CREATININE 1.61* 05/15/2015 0207   CREATININE 1.11 05/14/2015 1446   CALCIUM 9.0 05/16/2015 0429   CALCIUM 9.0  05/15/2015 0207   CALCIUM 9.2 05/14/2015 1446   GFRNONAA 56* 05/16/2015 0429   GFRNONAA 48* 05/15/2015 0207   GFRNONAA >60 05/14/2015 1446   GFRAA >60 05/16/2015 0429   GFRAA 56* 05/15/2015 0207   GFRAA >60 05/14/2015 1446   CBC    Component Value Date/Time   WBC 6.6 05/16/2015 0429   RBC 4.55 05/16/2015 0429   HGB 15.0 05/16/2015 0429   HCT 45.1 05/16/2015 0429   HCT 36.5* 10/20/2014 0210   PLT 138* 05/16/2015 0429   MCV 99.1 05/16/2015 0429   MCH 33.0 05/16/2015 0429   MCHC 33.3 05/16/2015 0429   RDW 14.6 05/16/2015 0429   LYMPHSABS 1.5 05/14/2015 0843   MONOABS 0.7 05/14/2015 0843   EOSABS 0.0 05/14/2015 0843   BASOSABS 0.0 05/14/2015 0843   HEPATIC Function Panel  Recent Labs  10/19/14 0244 05/14/15 0843 05/14/15 1446  PROT 5.3* 6.0* 6.4*   HEMOGLOBIN A1C No components found for: HGA1C,  MPG CARDIAC ENZYMES Lab Results  Component Value Date   CKTOTAL 129 09/13/2009   CKMB 2.4 09/13/2009   TROPONINI 0.05* 05/15/2015   TROPONINI 0.05* 05/14/2015   TROPONINI 0.05* 05/14/2015   BNP No results for input(s): PROBNP in the last 8760 hours. TSH  Recent Labs  07/30/14 2321 10/18/14 1904 05/14/15 1446  TSH 1.012 0.774 1.970   CHOLESTEROL  Recent Labs  10/19/14  0244 05/15/15 0207  CHOL 202* 137    Scheduled Meds: . ALPRAZolam  0.25 mg Oral BID  . apixaban  5 mg Oral BID  . atorvastatin  40 mg Oral q1800  . diltiazem  120 mg Oral Daily  . [START ON 05/17/2015] furosemide  20 mg Oral Daily  . metoprolol  25 mg Oral BID  . potassium chloride  20 mEq Oral Daily   Continuous Infusions: . sodium chloride 10 mL/hr at 05/15/15 1831   PRN Meds:.acetaminophen, meperidine (DEMEROL) injection, midazolam, nitroGLYCERIN, ondansetron (ZOFRAN) IV  Assessment/Plan: A. fib with RVR CHA2DS2VASc score of 4/9 Resolving Acute decompensated systolic heart failure Minimally elevated troponin I secondary to demand ischemia Status post Atypical chest pain MI ruled  out Hypertension History of TIA in the past EtOH abuse Tobacco abuse Anxiety disorder Hypomagnesemia Moderate to severe MR and TR  Change to oral medications. On Apixaban. Add xanax for panic attacks. Increase activity.     LOS: 2 days    Orpah Cobb  MD  05/16/2015, 8:31 AM

## 2015-05-17 LAB — CBC
HEMATOCRIT: 46 % (ref 39.0–52.0)
HEMOGLOBIN: 14.9 g/dL (ref 13.0–17.0)
MCH: 32.7 pg (ref 26.0–34.0)
MCHC: 32.4 g/dL (ref 30.0–36.0)
MCV: 101.1 fL — AB (ref 78.0–100.0)
Platelets: 139 10*3/uL — ABNORMAL LOW (ref 150–400)
RBC: 4.55 MIL/uL (ref 4.22–5.81)
RDW: 14.5 % (ref 11.5–15.5)
WBC: 6.5 10*3/uL (ref 4.0–10.5)

## 2015-05-17 MED ORDER — DIGOXIN 0.25 MG/ML IJ SOLN
0.2500 mg | Freq: Once | INTRAMUSCULAR | Status: AC
  Administered 2015-05-17: 0.25 mg via INTRAVENOUS
  Filled 2015-05-17: qty 2

## 2015-05-17 NOTE — Progress Notes (Signed)
Ref: [name],Robert H, NP   Subjective:  Feeling better but appears anxious. Asking for additional lorazepam to cope with shaky feeling.  Objective:  Vital Signs in the last 24 hours: Temp:  [97.9 F (36.6 C)-98.6 F (37 C)] 97.9 F (36.6 C) (04/16 0420) Pulse Rate:  [56-102] 101 (04/16 0630) Cardiac Rhythm:  [-] Atrial fibrillation (04/16 0630) Resp:  [15-31] 25 (04/16 0630) BP: (87-112)/(57-99) 112/99 mmHg (04/16 0630) SpO2:  [95 %-100 %] 97 % (04/16 0630) Weight:  [84.3 kg (185 lb 13.6 oz)] 84.3 kg (185 lb 13.6 oz) (04/16 0420)  Physical Exam: BP Readings from Last 1 Encounters:  05/17/15 112/99    Wt Readings from Last 1 Encounters:  05/17/15 84.3 kg (185 lb 13.6 oz)    Weight change: 1.019 kg (2 lb 4 oz)  HEENT: [place]/AT, Eyes- PERL, EOMI, Conjunctiva-Pink, Sclera-Non-icteric Neck: No JVD, No bruit, Trachea midline. Lungs:  Clear, Bilateral. Cardiac:  Irregular rhythm, normal S1 and S2, no S3. III/VI systolic murmur. Abdomen:  Soft, non-tender. Extremities:  No edema present. No cyanosis. No clubbing. CNS: AxOx3, Cranial nerves grossly intact, moves all 4 extremities. Right handed. Skin: Warm and dry.   Intake/Output from previous day: 04/15 0701 - 04/16 0700 In: 1300 [P.O.:1300] Out: 1600 [Urine:1600]    Lab Results: BMET    Component Value Date/Time   NA 139 05/16/2015 0429   NA 138 05/15/2015 0207   NA 142 05/14/2015 1446   K 4.1 05/16/2015 0429   K 4.5 05/15/2015 0207   K 3.7 05/14/2015 1446   CL 100* 05/16/2015 0429   CL 106 05/15/2015 0207   CL 106 05/14/2015 1446   CO2 26 05/16/2015 0429   CO2 19* 05/15/2015 0207   CO2 21* 05/14/2015 1446   GLUCOSE 112* 05/16/2015 0429   GLUCOSE 102* 05/15/2015 0207   GLUCOSE 156* 05/14/2015 1446   BUN 24* 05/16/2015 0429   BUN 21* 05/15/2015 0207   BUN 15 05/14/2015 1446   CREATININE 1.42* 05/16/2015 0429   CREATININE 1.61* 05/15/2015 0207   CREATININE 1.11 05/14/2015 1446   CALCIUM 9.0 05/16/2015 0429   CALCIUM 9.0 05/15/2015 0207   CALCIUM 9.2 05/14/2015 1446   GFRNONAA 56* 05/16/2015 0429   GFRNONAA 48* 05/15/2015 0207   GFRNONAA >60 05/14/2015 1446   GFRAA >60 05/16/2015 0429   GFRAA 56* 05/15/2015 0207   GFRAA >60 05/14/2015 1446   CBC    Component Value Date/Time   WBC 6.5 05/17/2015 0555   RBC 4.55 05/17/2015 0555   HGB 14.9 05/17/2015 0555   HCT 46.0 05/17/2015 0555   HCT 36.5* 10/20/2014 0210   PLT 139* 05/17/2015 0555   MCV 101.1* 05/17/2015 0555   MCH 32.7 05/17/2015 0555   MCHC 32.4 05/17/2015 0555   RDW 14.5 05/17/2015 0555   LYMPHSABS 1.5 05/14/2015 0843   MONOABS 0.7 05/14/2015 0843   EOSABS 0.0 05/14/2015 0843   BASOSABS 0.0 05/14/2015 0843   HEPATIC Function Panel  Recent Labs  10/19/14 0244 05/14/15 0843 05/14/15 1446  PROT 5.3* 6.0* 6.4*   HEMOGLOBIN A1C No components found for: HGA1C,  MPG CARDIAC ENZYMES Lab Results  Component Value Date   CKTOTAL 129 09/13/2009   CKMB 2.4 09/13/2009   TROPONINI 0.05* 05/15/2015   TROPONINI 0.05* 05/14/2015   TROPONINI 0.05* 05/14/2015   BNP No results for input(s): PROBNP in the last 8760 hours. TSH  Recent Labs  07/30/14 2321 10/18/14 1904 05/14/15 1446  TSH 1.012 0.774 1.970   CHOLESTEROL  Recent  Labs  10/19/14 0244 05/15/15 0207  CHOL 202* 137    Scheduled Meds: . apixaban  5 mg Oral BID  . atorvastatin  40 mg Oral q1800  . diltiazem  120 mg Oral Daily  . furosemide  20 mg Oral Daily  . metoprolol  25 mg Oral BID  . potassium chloride  20 mEq Oral Daily   Continuous Infusions: . sodium chloride 10 mL/hr at 05/15/15 1831   PRN Meds:.acetaminophen, alum & mag hydroxide-simeth, LORazepam, meperidine (DEMEROL) injection, midazolam, nitroGLYCERIN, ondansetron (ZOFRAN) IV  Assessment/Plan: A. fib with RVR CHA2DS2VASc score of 4/9 Resolving Acute decompensated systolic heart failure Minimally elevated troponin I secondary to demand ischemia Status post Atypical chest pain MI ruled  out Hypertension History of TIA in the past EtOH abuse Tobacco abuse Anxiety disorder Hypomagnesemia Moderate to severe MR and TR Alcohol withdrawal syndrome  Continue lorazepam.    LOS: 3 days    Orpah Cobb  MD  05/17/2015, 8:58 AM

## 2015-05-18 LAB — CBC
HEMATOCRIT: 47.1 % (ref 39.0–52.0)
HEMOGLOBIN: 15.8 g/dL (ref 13.0–17.0)
MCH: 33.7 pg (ref 26.0–34.0)
MCHC: 33.5 g/dL (ref 30.0–36.0)
MCV: 100.4 fL — ABNORMAL HIGH (ref 78.0–100.0)
Platelets: 145 10*3/uL — ABNORMAL LOW (ref 150–400)
RBC: 4.69 MIL/uL (ref 4.22–5.81)
RDW: 14.7 % (ref 11.5–15.5)
WBC: 7.4 10*3/uL (ref 4.0–10.5)

## 2015-05-18 MED ORDER — LISINOPRIL 2.5 MG PO TABS: 2.5000 mg | ORAL_TABLET | Freq: Every day | ORAL | Status: DC

## 2015-05-18 MED ORDER — ATORVASTATIN CALCIUM 40 MG PO TABS: 40.0000 mg | ORAL_TABLET | Freq: Every day | ORAL | Status: DC

## 2015-05-18 MED ORDER — METOPROLOL TARTRATE 50 MG PO TABS
50.0000 mg | ORAL_TABLET | Freq: Two times a day (BID) | ORAL | Status: DC
Start: 2015-05-18 — End: 2015-06-04

## 2015-05-18 MED ORDER — POTASSIUM CHLORIDE CRYS ER 20 MEQ PO TBCR: 20.0000 meq | EXTENDED_RELEASE_TABLET | Freq: Every day | ORAL | Status: DC

## 2015-05-18 MED ORDER — APIXABAN 5 MG PO TABS: 5.0000 mg | ORAL_TABLET | Freq: Two times a day (BID) | ORAL | Status: DC

## 2015-05-18 MED ORDER — FUROSEMIDE 20 MG PO TABS: 20.0000 mg | ORAL_TABLET | Freq: Every day | ORAL | Status: DC

## 2015-05-18 NOTE — Care Management Note (Addendum)
Case Management Note  Patient Details  Name: [name] MRN: [id number] Date of Birth: 05/05/1965  Subjective/Objective:    Pt admitted for CHF- Pt states he is living with a friend and will need transportation to [facility] building. CSW was sent a message for bus pass.  Pt states he sees [name] NP at the [facility].                 Action/Plan: CM did call [name] to see if she will be able to assist. CM awaiting on phone call.   Expected Discharge Date:                  Expected Discharge Plan:  Home/Self Care  In-House Referral:  Clinical Social Work  Discharge planning Services  CM Consult, Indigent Health Clinic, Medication Assistance  Post Acute Care Choice:  NA Choice offered to:  NA  DME Arranged:  N/A DME Agency:  NA  HH Arranged:  NA HH Agency:  NA  Status of Service:  Completed, signed off  Medicare Important Message Given:    Date Medicare IM Given:    Medicare IM give by:    Date Additional Medicare IM Given:    Additional Medicare Important Message give by:     If discussed at Long Length of Stay Meetings, dates discussed:    Additional Comments: 1331 05-08-15 Tomi Bamberger, RN,BSN [phone] CM did not receive call back from [facility], however secretary did state pt could come to [facility] in the AM @ 9:00 am to the medical clinic for medication assistance. Pt has a ride for today. No further needs from CM at this time.   Gala Lewandowsky, RN 05/18/2015, 11:50 AM

## 2015-05-18 NOTE — Discharge Summary (Signed)
Discharge summary dictated on 05/18/2015, dictation number is [id number]

## 2015-05-18 NOTE — Plan of Care (Signed)
Problem: Discharge Progression Outcomes Goal: No anginal pain Outcome: Completed/Met Date Met:  05/18/15 Pt remains free from chest pain  Goal: Complications resolved/controlled Outcome: Completed/Met Date Met:  69/79/48 Complications have been resolved Goal: Discharge plan in place and appropriate Outcome: Completed/Met Date Met:  05/18/15 Pt will be discharged home today  Goal: Tolerates diet Outcome: Completed/Met Date Met:  05/18/15 Pt able to tolerate current diet with no issues

## 2015-05-18 NOTE — Plan of Care (Signed)
Problem: Cardiac: Goal: Ability to achieve and maintain adequate cardiopulmonary perfusion will improve Outcome: Progressing Patient continues to sustain atrial fibrillation with normal ventricular response. Rates characteristically 70-85 with brief periodic elevations during activity to 100-120. BP has been somewhat labile.  Patient reports fatigue and anxiety with episodic palpitations. No further report of chest pain.  Requested lorazepam for anxiety; given on request as administration time and vital signs were appropriate.  Recent vital signs: Filed Vitals:    05/17/15 1708 05/17/15 1710 05/17/15 2000 05/17/15 2106  BP: 106/72 106/72 106/79 94/82  Pulse: 73 92 91    Temp:       98.1 F (36.7 C)  TempSrc:       Oral  Resp: 19 16 22 21   Height:          Weight:          SpO2: 98% 98% 97% 98%    Due to fatigue and heightened anxiety, patient declined in-depth education this evening. Able to review medications and basic discharge plan with patient at bedside. He has very limited medical knowledge concerning even basic aspects of his care. Furthermore, he does not appear to be ready to learn at this juncture, given his current emotional state.  Will continue to monitor and follow for any developing educational needs.

## 2015-05-18 NOTE — Progress Notes (Signed)
Chaplain presented to the patient as a referral from Chaplain who had provided prior spiritual care support for the patient.  He reports he had not been compliant with his medications, but now has a plan of care for scheduling regular Dr. appointments, getting his medications and following dosage as directed.  The patient also shared he had been a failed relationship which resulted in his emotional desire not to do self care, he states that issue is now resolved and he is committed to do better with his health issues. Chaplain encouraged the patient to stay focused on his health needs to prevent any other medical crisis. A prayer of healing was offered on the patient's behalf. Chaplain Janell Quiet [phone]

## 2015-05-18 NOTE — Discharge Instructions (Signed)
Atrial Fibrillation °Atrial fibrillation is a type of irregular or rapid heartbeat (arrhythmia). In atrial fibrillation, the heart quivers continuously in a chaotic pattern. This occurs when parts of the heart receive disorganized signals that make the heart unable to pump blood normally. This can increase the risk for stroke, heart failure, and other heart-related conditions. There are different types of atrial fibrillation, including: °· Paroxysmal atrial fibrillation. This type starts suddenly, and it usually stops on its own shortly after it starts. °· Persistent atrial fibrillation. This type often lasts longer than a week. It may stop on its own or with treatment. °· Long-lasting persistent atrial fibrillation. This type lasts longer than 12 months. °· Permanent atrial fibrillation. This type does not go away. °Talk with your health care provider to learn about the type of atrial fibrillation that you have. °CAUSES °This condition is caused by some heart-related conditions or procedures, including: °· A heart attack. °· Coronary artery disease. °· Heart failure. °· Heart valve conditions. °· High blood pressure. °· Inflammation of the sac that surrounds the heart (pericarditis). °· Heart surgery. °· Certain heart rhythm disorders, such as Wolf-Parkinson-White syndrome. °Other causes include: °· Pneumonia. °· Obstructive sleep apnea. °· Blockage of an artery in the lungs (pulmonary embolism, or PE). °· Lung cancer. °· Chronic lung disease. °· Thyroid problems, especially if the thyroid is overactive (hyperthyroidism). °· Caffeine. °· Excessive alcohol use or illegal drug use. °· Use of some medicines, including certain decongestants and diet pills. °Sometimes, the cause cannot be found. °RISK FACTORS °This condition is more likely to develop in: °· People who are older in age. °· People who smoke. °· People who have diabetes mellitus. °· People who are overweight (obese). °· Athletes who exercise  vigorously. °SYMPTOMS °Symptoms of this condition include: °· A feeling that your heart is beating rapidly or irregularly. °· A feeling of discomfort or pain in your chest. °· Shortness of breath. °· Sudden light-headedness or weakness. °· Getting tired easily during exercise. °In some cases, there are no symptoms. °DIAGNOSIS °Your health care provider may be able to detect atrial fibrillation when taking your pulse. If detected, this condition may be diagnosed with: °· An electrocardiogram (ECG). °· A Holter monitor test that records your heartbeat patterns over a 24-hour period. °· Transthoracic echocardiogram (TTE) to evaluate how blood flows through your heart. °· Transesophageal echocardiogram (TEE) to view more detailed images of your heart. °· A stress test. °· Imaging tests, such as a CT scan or chest X-ray. °· Blood tests. °TREATMENT °The main goals of treatment are to prevent blood clots from forming and to keep your heart beating at a normal rate and rhythm. The type of treatment that you receive depends on many factors, such as your underlying medical conditions and how you feel when you are experiencing atrial fibrillation. °This condition may be treated with: °· Medicine to slow down the heart rate, bring the heart's rhythm back to normal, or prevent clots from forming. °· Electrical cardioversion. This is a procedure that resets your heart's rhythm by delivering a controlled, low-energy shock to the heart through your skin. °· Different types of ablation, such as catheter ablation, catheter ablation with pacemaker, or surgical ablation. These procedures destroy the heart tissues that send abnormal signals. When the pacemaker is used, it is placed under your skin to help your heart beat in a regular rhythm. °HOME CARE INSTRUCTIONS °· Take over-the counter and prescription medicines only as told by your health care provider. °·   If your health care provider prescribed a blood-thinning medicine  (anticoagulant), take it exactly as told. Taking too much blood-thinning medicine can cause bleeding. If you do not take enough blood-thinning medicine, you will not have the protection that you need against stroke and other problems.  Do not use tobacco products, including cigarettes, chewing tobacco, and e-cigarettes. If you need help quitting, ask your health care provider.  If you have obstructive sleep apnea, manage your condition as told by your health care provider.  Do not drink alcohol.  Do not drink beverages that contain caffeine, such as coffee, soda, and tea.  Maintain a healthy weight. Do not use diet pills unless your health care provider approves. Diet pills may make heart problems worse.  Follow diet instructions as told by your health care provider.  Exercise regularly as told by your health care provider.  Keep all follow-up visits as told by your health care provider. This is important. PREVENTION  Avoid drinking beverages that contain caffeine or alcohol.  Avoid certain medicines, especially medicines that are used for breathing problems.  Avoid certain herbs and herbal medicines, such as those that contain ephedra or ginseng.  Do not use illegal drugs, such as cocaine and amphetamines.  Do not smoke.  Manage your high blood pressure. SEEK MEDICAL CARE IF:  You notice a change in the rate, rhythm, or strength of your heartbeat.  You are taking an anticoagulant and you notice increased bruising.  You tire more easily when you exercise or exert yourself. SEEK IMMEDIATE MEDICAL CARE IF:  You have chest pain, abdominal pain, sweating, or weakness.  You feel nauseous.  You notice blood in your vomit, bowel movement, or urine.  You have shortness of breath.  You suddenly have swollen feet and ankles.  You feel dizzy.  You have sudden weakness or numbness of the face, arm, or leg, especially on one side of the body.  You have trouble speaking,  trouble understanding, or both (aphasia).  Your face or your eyelid droops on one side. These symptoms may represent a serious problem that is an emergency. Do not wait to see if the symptoms will go away. Get medical help right away. Call your local emergency services (911 in the [place] Do not drive yourself to the hospital.   This information is not intended to replace advice given to you by your health care provider. Make sure you discuss any questions you have with your health care provider.   Document Released: 01/17/2005 Document Revised: 10/08/2014 Document Reviewed: 05/14/2014 Elsevier Interactive Patient Education 2016 [employer]  Atrial Fibrillation Atrial fibrillation is a type of heartbeat that is irregular or fast (rapid). If you have this condition, your heart keeps quivering in a weird (chaotic) way. This condition can make it so your heart cannot pump blood normally. Having this condition gives a person more risk for stroke, heart failure, and other heart problems. There are different types of atrial fibrillation. Talk with your doctor to learn about the type that you have. HOME CARE  Take over-the-counter and prescription medicines only as told by your doctor.  If your doctor prescribed a blood-thinning medicine, take it exactly as told. Taking too much of it can cause bleeding. If you do not take enough of it, you will not have the protection that you need against stroke and other problems.  Do not use any tobacco products. These include cigarettes, chewing tobacco, and e-cigarettes. If you need help quitting, ask your doctor.  If  you have apnea (obstructive sleep apnea), manage it as told by your doctor.  Do not drink alcohol.  Do not drink beverages that have caffeine. These include coffee, soda, and tea.  Maintain a healthy weight. Do not use diet pills unless your doctor says they are safe for you. Diet pills may make heart problems worse.  Follow diet  instructions as told by your doctor.  Exercise regularly as told by your doctor.  Keep all follow-up visits as told by your doctor. This is important. GET HELP IF:  You notice a change in the speed, rhythm, or strength of your heartbeat.  You are taking a blood-thinning medicine and you notice more bruising.  You get tired more easily when you move or exercise. GET HELP RIGHT AWAY IF:  You have pain in your chest or your belly (abdomen).  You have sweating or weakness.  You feel sick to your stomach (nauseous).  You notice blood in your throw up (vomit), poop (stool), or pee (urine).  You are short of breath.  You suddenly have swollen feet and ankles.  You feel dizzy.  Your suddenly get weak or numb in your face, arms, or legs, especially if it happens on one side of your body.  You have trouble talking, trouble understanding, or both.  Your face or your eyelid droops on one side. These symptoms may be an emergency. Do not wait to see if the symptoms will go away. Get medical help right away. Call your local emergency services (911 in the [place] Do not drive yourself to the hospital.   This information is not intended to replace advice given to you by your health care provider. Make sure you discuss any questions you have with your health care provider.   Document Released: 10/27/2007 Document Revised: 10/08/2014 Document Reviewed: 05/14/2014 [employer] Interactive Patient Education 2016 [employer] Heart Failure Heart failure is a condition in which the heart has trouble pumping blood. This means your heart does not pump blood efficiently for your body to work well. In some cases of heart failure, fluid may back up into your lungs or you may have swelling (edema) in your lower legs. Heart failure is usually a long-term (chronic) condition. It is important for you to take good care of yourself and follow your health care provider's treatment plan. CAUSES  Some health  conditions can cause heart failure. Those health conditions include:  High blood pressure (hypertension). Hypertension causes the heart muscle to work harder than normal. When pressure in the blood vessels is high, the heart needs to pump (contract) with more force in order to circulate blood throughout the body. High blood pressure eventually causes the heart to become stiff and weak.  Coronary artery disease (CAD). CAD is the buildup of cholesterol and fat (plaque) in the arteries of the heart. The blockage in the arteries deprives the heart muscle of oxygen and blood. This can cause chest pain and may lead to a heart attack. High blood pressure can also contribute to CAD.  Heart attack (myocardial infarction). A heart attack occurs when one or more arteries in the heart become blocked. The loss of oxygen damages the muscle tissue of the heart. When this happens, part of the heart muscle dies. The injured tissue does not contract as well and weakens the heart's ability to pump blood.  Abnormal heart valves. When the heart valves do not open and close properly, it can cause heart failure. This makes the heart muscle pump harder  to keep the blood flowing.  Heart muscle disease (cardiomyopathy or myocarditis). Heart muscle disease is damage to the heart muscle from a variety of causes. These can include drug or alcohol abuse, infections, or unknown reasons. These can increase the risk of heart failure.  Lung disease. Lung disease makes the heart work harder because the lungs do not work properly. This can cause a strain on the heart, leading it to fail.  Diabetes. Diabetes increases the risk of heart failure. High blood sugar contributes to high fat (lipid) levels in the blood. Diabetes can also cause slow damage to tiny blood vessels that carry important nutrients to the heart muscle. When the heart does not get enough oxygen and food, it can cause the heart to become weak and stiff. This leads to a  heart that does not contract efficiently.  Other conditions can contribute to heart failure. These include abnormal heart rhythms, thyroid problems, and low blood counts (anemia). Certain unhealthy behaviors can increase the risk of heart failure, including:  Being overweight.  Smoking or chewing tobacco.  Eating foods high in fat and cholesterol.  Abusing illicit drugs or alcohol.  Lacking physical activity. SYMPTOMS  Heart failure symptoms may vary and can be hard to detect. Symptoms may include:  Shortness of breath with activity, such as climbing stairs.  Persistent cough.  Swelling of the feet, ankles, legs, or abdomen.  Unexplained weight gain.  Difficulty breathing when lying flat (orthopnea).  Waking from sleep because of the need to sit up and get more air.  Rapid heartbeat.  Fatigue and loss of energy.  Feeling light-headed, dizzy, or close to fainting.  Loss of appetite.  Nausea.  Increased urination during the night (nocturia). DIAGNOSIS  A diagnosis of heart failure is based on your history, symptoms, physical examination, and diagnostic tests. Diagnostic tests for heart failure may include:  Echocardiography.  Electrocardiography.  Chest X-ray.  Blood tests.  Exercise stress test.  Cardiac angiography.  Radionuclide scans. TREATMENT  Treatment is aimed at managing the symptoms of heart failure. Medicines, behavioral changes, or surgical intervention may be necessary to treat heart failure.  Medicines to help treat heart failure may include:  Angiotensin-converting enzyme (ACE) inhibitors. This type of medicine blocks the effects of a blood protein called angiotensin-converting enzyme. ACE inhibitors relax (dilate) the blood vessels and help lower blood pressure.  Angiotensin receptor blockers (ARBs). This type of medicine blocks the actions of a blood protein called angiotensin. Angiotensin receptor blockers dilate the blood vessels and  help lower blood pressure.  Water pills (diuretics). Diuretics cause the kidneys to remove salt and water from the blood. The extra fluid is removed through urination. This loss of extra fluid lowers the volume of blood the heart pumps.  Beta blockers. These prevent the heart from beating too fast and improve heart muscle strength.  Digitalis. This increases the force of the heartbeat.  Healthy behavior changes include:  Obtaining and maintaining a healthy weight.  Stopping smoking or chewing tobacco.  Eating heart-healthy foods.  Limiting or avoiding alcohol.  Stopping illicit drug use.  Physical activity as directed by your health care provider.  Surgical treatment for heart failure may include:  A procedure to open blocked arteries, repair damaged heart valves, or remove damaged heart muscle tissue.  A pacemaker to improve heart muscle function and control certain abnormal heart rhythms.  An internal cardioverter defibrillator to treat certain serious abnormal heart rhythms.  A left ventricular assist device (LVAD) to assist the  pumping ability of the heart. HOME CARE INSTRUCTIONS   Take medicines only as directed by your health care provider. Medicines are important in reducing the workload of your heart, slowing the progression of heart failure, and improving your symptoms.  Do not stop taking your medicine unless directed by your health care provider.  Do not skip any dose of medicine.  Refill your prescriptions before you run out of medicine. Your medicines are needed every day.  Engage in moderate physical activity if directed by your health care provider. Moderate physical activity can benefit some people. The elderly and people with severe heart failure should consult with a health care provider for physical activity recommendations.  Eat heart-healthy foods. Food choices should be free of trans fat and low in saturated fat, cholesterol, and salt (sodium). Healthy  choices include fresh or frozen fruits and vegetables, fish, lean meats, legumes, fat-free or low-fat dairy products, and whole grain or high fiber foods. Talk to a dietitian to learn more about heart-healthy foods.  Limit sodium if directed by your health care provider. Sodium restriction may reduce symptoms of heart failure in some people. Talk to a dietitian to learn more about heart-healthy seasonings.  Use healthy cooking methods. Healthy cooking methods include roasting, grilling, broiling, baking, poaching, steaming, or stir-frying. Talk to a dietitian to learn more about healthy cooking methods.  Limit fluids if directed by your health care provider. Fluid restriction may reduce symptoms of heart failure in some people.  Weigh yourself every day. Daily weights are important in the early recognition of excess fluid. You should weigh yourself every morning after you urinate and before you eat breakfast. Wear the same amount of clothing each time you weigh yourself. Record your daily weight. Provide your health care provider with your weight record.  Monitor and record your blood pressure if directed by your health care provider.  Check your pulse if directed by your health care provider.  Lose weight if directed by your health care provider. Weight loss may reduce symptoms of heart failure in some people.  Stop smoking or chewing tobacco. Nicotine makes your heart work harder by causing your blood vessels to constrict. Do not use nicotine gum or patches before talking to your health care provider.  Keep all follow-up visits as directed by your health care provider. This is important.  Limit alcohol intake to no more than 1 drink per day for nonpregnant women and 2 drinks per day for men. One drink equals 12 ounces of beer, 5 ounces of wine, or 1 ounces of hard liquor. Drinking more than that is harmful to your heart. Tell your health care provider if you drink alcohol several times a week.  Talk with your health care provider about whether alcohol is safe for you. If your heart has already been damaged by alcohol or you have severe heart failure, drinking alcohol should be stopped completely.  Stop illicit drug use.  Stay up-to-date with immunizations. It is especially important to prevent respiratory infections through current pneumococcal and influenza immunizations.  Manage other health conditions such as hypertension, diabetes, thyroid disease, or abnormal heart rhythms as directed by your health care provider.  Learn to manage stress.  Plan rest periods when fatigued.  Learn strategies to manage high temperatures. If the weather is extremely hot:  Avoid vigorous physical activity.  Use air conditioning or fans or seek a cooler location.  Avoid caffeine and alcohol.  Wear loose-fitting, lightweight, and light-colored clothing.  Learn strategies to  manage cold temperatures. If the weather is extremely cold:  Avoid vigorous physical activity.  Layer clothes.  Wear mittens or gloves, a hat, and a scarf when going outside.  Avoid alcohol.  Obtain ongoing education and support as needed.  Participate in or seek rehabilitation as needed to maintain or improve independence and quality of life. SEEK MEDICAL CARE IF:   You have a rapid weight gain.  You have increasing shortness of breath that is unusual for you.  You are unable to participate in your usual physical activities.  You tire easily.  You cough more than normal, especially with physical activity.  You have any or more swelling in areas such as your hands, feet, ankles, or abdomen.  You are unable to sleep because it is hard to breathe.  You feel like your heart is beating fast (palpitations).  You become dizzy or light-headed upon standing up. SEEK IMMEDIATE MEDICAL CARE IF:   You have difficulty breathing.  There is a change in mental status such as decreased alertness or difficulty with  concentration.  You have a pain or discomfort in your chest.  You have an episode of fainting (syncope). MAKE SURE YOU:   Understand these instructions.  Will watch your condition.  Will get help right away if you are not doing well or get worse.   This information is not intended to replace advice given to you by your health care provider. Make sure you discuss any questions you have with your health care provider.   Document Released: 01/17/2005 Document Revised: 06/03/2014 Document Reviewed: 02/17/2012 [employer] Interactive Patient Education [employer]

## 2015-05-19 ENCOUNTER — Encounter (HOSPITAL_COMMUNITY): Payer: Self-pay | Admitting: Cardiovascular Disease

## 2015-05-19 NOTE — Discharge Summary (Signed)
[name], [name] NO.:  [id number]  MEDICAL RECORD NO.:  [id number]  LOCATION:  3W18C                        FACILITY:  [facility]  PHYSICIAN:  [name], M.D. DATE OF BIRTH:  1965/09/22  DATE OF ADMISSION:  05/14/2015 DATE OF DISCHARGE:  05/18/2015                              DISCHARGE SUMMARY   ADMITTING DIAGNOSES: 1. Atrial fibrillation with rapid ventricular response. 2. Acute decompensated systolic congestive heart failure. 3. Atypical chest pain rule out myocardial infarction. 4. Hypertension. 5. History of transient ischemic attack in the past. 6. EtOH abuse. 7. Tobacco abuse. 8. Anxiety disorder. 9. History of cocaine abuse in the past.  FINAL DIAGNOSES: 1. Chronic atrial fibrillation with left atrial appendage thrombus     status post TEE. 2. Compensated systolic congestive heart failure. 3. Status post atypical chest pain. 4. Hypertension. 5. History of transient ischemic attack in the past. 6. History of EtOH abuse. 7. Tobacco abuse. 8. History of cocaine abuse. 9. Anxiety disorder.  DISCHARGE HOME MEDICATIONS: 1. Atorvastatin 40 mg 1 tablet daily. 2. Furosemide 20 mg 1 tablet daily. 3. Lisinopril 2.5 mg 1 tablet daily. 4. Potassium chloride 20 mEq 1 tablet daily. 5. Albuterol inhaler 2 puffs every 6 hours as before. 6. Digoxin 0.25 mg 1 tablet daily as before. 7. Eliquis 5 mg 1 tablet twice daily. 8. Metoprolol tartrate 50 mg twice daily. 9. The patient has been advised to stop amiodarone.  DIET:  Low salt, low cholesterol.  ACTIVITY:  Increase activity slowly as tolerated.  FOLLOWUP:  Follow up with me in 1 week.  The patient will be scheduled for TEE cardioversion in 4 weeks if there is resolution of left atrial thrombus.  CONDITION AT DISCHARGE:  Stable.  The patient has been advised to refrain from drinking alcohol or smoking or cocaine.  BRIEF HISTORY AND HOSPITAL COURSE:  [name] is a 50 year old male with  past medical history significant for nonischemic dilated cardiomyopathy, hypertension, history of AFib with RVR in the past, CHADS-VASc score of 4, anxiety disorder, history of TIA, EtOH abuse, tobacco abuse, history of cocaine abuse in the past, noncompliant with medication and followup.  He came to the ER complaining of progressive increasing shortness of breath for the last 5 days associated with pleuritic chest pain and palpitation.  The patient was noted to be in AFib with RVR.  Received IV Cardizem bolus and was started on a drip with control of heart rate in low 100s.  The patient was started also on heparin in the ED.  The patient states he stopped all his medication approximately 3 months ago and has started on alcohol binge and the patient states he has been drinking 9-10 40-ounce of beer every day. Denies any anginal chest pain.  Denies leg swelling.  Denies syncopal episodes.  Denies weakness in the arms or legs.  The patient had a TEE in July of 2016 which showed left atrial appendage thrombus and was started on Eliquis and rate control.  The patient subsequently did not follow up since July of last year.  PHYSICAL EXAMINATION:  GENERAL:  He was alert, awake, oriented x3, in no acute distress. VITAL SIGNS:  Blood  pressure was 123/109, pulse was 110-140 irregularly irregular. HEENT:  Conjunctivae pink. NECK:  Supple.  Positive JVD. LUNGS:  Decreased breath sounds at bases with bibasilar faint rales. CARDIOVASCULAR:  Irregularly irregular.  Tachycardic.  S1-S2 soft. There was soft systolic murmur and S3 gallop. ABDOMEN:  Soft.  Bowel sounds present.  Nontender. EXTREMITIES:  There is no clubbing, cyanosis, or edema.  LABORATORY DATA:  Sodium was 140, potassium 4.4, BUN 15, creatinine 1.25.  His BNP was 1222, repeat was 1076.  Troponin I minimally elevated at 0.05 which was attributed to heart failure.  Hemoglobin was 15.7, hematocrit 45.6.  His magnesium was 1.4, which was  replaced.  Repeat magnesium was 1.8.  His cholesterol was 137, triglycerides 48, HDL 44, LDL was 83.  Hemoglobin was 15.1, hematocrit 45, white count of 8.0.  BRIEF HOSPITAL COURSE:  The patient was admitted to the telemetry unit. MI was ruled out by serial enzymes and EKG.  The patient was started on IV Cardizem and p.o. Lopressor was added with fairly well controlled heart rate in 90s to 100s.  IV Cardizem was switched to p.o. Cardizem which has been discontinued in view of depressed LV systolic function. His metoprolol dose has been increased and low-dose ACE inhibitors have been added.  The patient did not have any anginal chest pain during the hospital stay.  The patient subsequently underwent TEE and was noted to have left atrial appendage thrombus.  As the patient was out of his Eliquis for 2-3 months, heparin was switched back to Eliquis.  The patient will be fully anticoagulated for at least 4 weeks.  We will repeat the TEE in 4 weeks and will proceed with TEE assisted cardioversion if left atrial appendage shows no evidence of thrombus.  The patient will be discharged home on above medications and will be followed up closely in my office in 1 week.     [name], M.D.     [facility]/MEDQ  D:  05/18/2015  T:  05/19/2015  Job:  [id number]

## 2015-06-04 ENCOUNTER — Other Ambulatory Visit: Payer: Self-pay

## 2015-06-04 ENCOUNTER — Emergency Department (HOSPITAL_COMMUNITY)
Admission: EM | Admit: 2015-06-04 | Discharge: 2015-06-04 | Disposition: A | Discharge status: Home / Self Care | Payer: Medicaid Other | Attending: Emergency Medicine | Admitting: Emergency Medicine

## 2015-06-04 ENCOUNTER — Encounter (HOSPITAL_COMMUNITY): Payer: Self-pay

## 2015-06-04 ENCOUNTER — Emergency Department (HOSPITAL_COMMUNITY): Admit: 2015-06-04 | Payer: Medicaid Other

## 2015-06-04 ENCOUNTER — Emergency Department (HOSPITAL_COMMUNITY): Payer: Medicaid Other

## 2015-06-04 DIAGNOSIS — Z7901 Long term (current) use of anticoagulants: Secondary | ICD-10-CM | POA: Insufficient documentation

## 2015-06-04 DIAGNOSIS — Z79899 Other long term (current) drug therapy: Secondary | ICD-10-CM | POA: Insufficient documentation

## 2015-06-04 DIAGNOSIS — Z8719 Personal history of other diseases of the digestive system: Secondary | ICD-10-CM | POA: Diagnosis not present

## 2015-06-04 DIAGNOSIS — I10 Essential (primary) hypertension: Secondary | ICD-10-CM | POA: Diagnosis not present

## 2015-06-04 DIAGNOSIS — Z7982 Long term (current) use of aspirin: Secondary | ICD-10-CM | POA: Diagnosis not present

## 2015-06-04 DIAGNOSIS — Z87891 Personal history of nicotine dependence: Secondary | ICD-10-CM | POA: Diagnosis not present

## 2015-06-04 DIAGNOSIS — Z8659 Personal history of other mental and behavioral disorders: Secondary | ICD-10-CM | POA: Insufficient documentation

## 2015-06-04 DIAGNOSIS — R002 Palpitations: Secondary | ICD-10-CM | POA: Diagnosis present

## 2015-06-04 DIAGNOSIS — Z8673 Personal history of transient ischemic attack (TIA), and cerebral infarction without residual deficits: Secondary | ICD-10-CM | POA: Diagnosis not present

## 2015-06-04 DIAGNOSIS — I4891 Unspecified atrial fibrillation: Secondary | ICD-10-CM | POA: Diagnosis not present

## 2015-06-04 DIAGNOSIS — I252 Old myocardial infarction: Secondary | ICD-10-CM | POA: Diagnosis not present

## 2015-06-04 LAB — CBC WITH DIFFERENTIAL/PLATELET
Basophils Absolute: 0.1 10*3/uL (ref 0.0–0.1)
Basophils Relative: 1 %
Eosinophils Absolute: 0.1 10*3/uL (ref 0.0–0.7)
Eosinophils Relative: 1 %
HCT: 44.7 % (ref 39.0–52.0)
Hemoglobin: 14.8 g/dL (ref 13.0–17.0)
Lymphocytes Relative: 29 %
Lymphs Abs: 1.7 10*3/uL (ref 0.7–4.0)
MCH: 33.6 pg (ref 26.0–34.0)
MCHC: 33.1 g/dL (ref 30.0–36.0)
MCV: 101.4 fL — ABNORMAL HIGH (ref 78.0–100.0)
Monocytes Absolute: 0.6 10*3/uL (ref 0.1–1.0)
Monocytes Relative: 10 %
Neutro Abs: 3.6 10*3/uL (ref 1.7–7.7)
Neutrophils Relative %: 59 %
Platelets: 156 10*3/uL (ref 150–400)
RBC: 4.41 MIL/uL (ref 4.22–5.81)
RDW: 14.6 % (ref 11.5–15.5)
WBC: 6 10*3/uL (ref 4.0–10.5)

## 2015-06-04 LAB — PROTIME-INR
INR: 2.19 — ABNORMAL HIGH (ref 0.00–1.49)
PROTHROMBIN TIME: 24.1 s — AB (ref 11.6–15.2)

## 2015-06-04 LAB — COMPREHENSIVE METABOLIC PANEL
ALT: 43 U/L (ref 17–63)
AST: 48 U/L — ABNORMAL HIGH (ref 15–41)
Albumin: 3.3 g/dL — ABNORMAL LOW (ref 3.5–5.0)
Alkaline Phosphatase: 55 U/L (ref 38–126)
Anion gap: 12 (ref 5–15)
BUN: 23 mg/dL — ABNORMAL HIGH (ref 6–20)
CO2: 19 mmol/L — ABNORMAL LOW (ref 22–32)
Calcium: 9.1 mg/dL (ref 8.9–10.3)
Chloride: 110 mmol/L (ref 101–111)
Creatinine, Ser: 1.34 mg/dL — ABNORMAL HIGH (ref 0.61–1.24)
GFR calc Af Amer: >60 mL/min (ref >60)
GFR calc non Af Amer: >60 mL/min (ref >60)
Glucose, Bld: 107 mg/dL — ABNORMAL HIGH (ref 65–99)
Potassium: 4.4 mmol/L (ref 3.5–5.1)
Sodium: 141 mmol/L (ref 135–145)
Total Bilirubin: 1.3 mg/dL — ABNORMAL HIGH (ref 0.3–1.2)
Total Protein: 5.9 g/dL — ABNORMAL LOW (ref 6.5–8.1)

## 2015-06-04 LAB — I-STAT TROPONIN, ED: Troponin i, poc: 0.02 ng/mL (ref 0.00–0.08)

## 2015-06-04 LAB — APTT: APTT: 37 s (ref 24–37)

## 2015-06-04 MED ORDER — POTASSIUM CHLORIDE CRYS ER 20 MEQ PO TBCR: 20.0000 meq | EXTENDED_RELEASE_TABLET | Freq: Every day | ORAL | Status: AC

## 2015-06-04 MED ORDER — APIXABAN 5 MG PO TABS: 5.0000 mg | ORAL_TABLET | Freq: Two times a day (BID) | ORAL | Status: AC

## 2015-06-04 MED ORDER — DIGOXIN 250 MCG PO TABS: 0.2500 mg | ORAL_TABLET | Freq: Every day | ORAL | Status: DC

## 2015-06-04 MED ORDER — METOPROLOL TARTRATE 25 MG PO TABS
50.0000 mg | ORAL_TABLET | Freq: Once | ORAL | Status: AC
  Administered 2015-06-04: 50 mg via ORAL
  Filled 2015-06-04: qty 2

## 2015-06-04 MED ORDER — METOPROLOL TARTRATE 50 MG PO TABS: 50.0000 mg | ORAL_TABLET | Freq: Two times a day (BID) | ORAL | Status: DC

## 2015-06-04 MED ORDER — DIGOXIN 0.25 MG/ML IJ SOLN
0.5000 mg | Freq: Once | INTRAMUSCULAR | Status: AC
  Administered 2015-06-04: 0.5 mg via INTRAVENOUS
  Filled 2015-06-04: qty 2

## 2015-06-04 MED ORDER — LISINOPRIL 2.5 MG PO TABS: 2.5000 mg | ORAL_TABLET | Freq: Every day | ORAL | Status: DC

## 2015-06-04 MED ORDER — ATORVASTATIN CALCIUM 40 MG PO TABS: 40.0000 mg | ORAL_TABLET | Freq: Every day | ORAL | Status: AC

## 2015-06-04 MED ORDER — DILTIAZEM HCL 100 MG IV SOLR
5.0000 mg/h | INTRAVENOUS | Status: DC
  Administered 2015-06-04: 5 mg/h via INTRAVENOUS
  Filled 2015-06-04: qty 100

## 2015-06-04 MED ORDER — APIXABAN 5 MG PO TABS
5.0000 mg | ORAL_TABLET | Freq: Two times a day (BID) | ORAL | Status: DC
  Filled 2015-06-04: qty 1

## 2015-06-04 MED ORDER — FUROSEMIDE 20 MG PO TABS: 20.0000 mg | ORAL_TABLET | Freq: Every day | ORAL | Status: DC

## 2015-06-04 MED ORDER — DIGOXIN 0.1 MG/ML IJ SOLN: 8.0000 ug/kg | Freq: Once | INTRAMUSCULAR | Status: DC

## 2015-06-04 MED ORDER — DILTIAZEM LOAD VIA INFUSION
15.0000 mg | Freq: Once | INTRAVENOUS | Status: AC
Start: 2015-06-04 — End: 2015-06-04
  Administered 2015-06-04: 15 mg via INTRAVENOUS
  Filled 2015-06-04: qty 15

## 2015-06-04 NOTE — Discharge Planning (Signed)
[facility] consulted regarding medication assistance.  Pt advised to fill current Rx at [facility] with [facility] assistance.  [facility] faxed MATCH card to [facility] [phone].

## 2015-06-04 NOTE — ED Provider Notes (Signed)
CSN: [id number]     Arrival date & time 06/04/15  0735 History   First MD Initiated Contact with Patient 06/04/15 [phone]     Chief Complaint  Patient presents with  . Atrial Fibrillation   HPI Patient presents to the emergency room with complaints of palpitations and tachycardia. Patient has a history of atrial fibrillation. He was in the hospital in April for an exacerbation of his atrial fibrillation. At that time, the patient had not been taking his medications because of financial issues. Patient was treated for atrial fibrillation with rapid ventricular rate and discharged home. Patient states unfortunately since he left the hospital he has not restarted his medications. His having financial difficulties and was unable to afford them at the health department. Patient noticed today that his heart was racing and he was feeling short of breath. He denies any trouble with chest pain. No vomiting no diarrhea and no fevers. Past Medical History  Diagnosis Date  . Hypertension   . Alcoholism /alcohol abuse (HCC)   . Anxiety and depression   . Tobacco abuse   . Atrial fibrillation (HCC)   . Anxiety   . Depression   . Myocardial infarction (HCC)   . Stroke (HCC)   . History of hiatal hernia   . Headache    Past Surgical History  Procedure Laterality Date  . Hernia repair      UHR x2  . Tee without cardioversion N/A 08/01/2014    Procedure: TRANSESOPHAGEAL ECHOCARDIOGRAM (TEE);  Surgeon: Orpah Cobb, MD;  Location[facility] ENDOSCOPY;  Service: Cardiovascular;  Laterality: N/A;  . Fracture surgery      right FA  . Tee without cardioversion N/A 05/15/2015    Procedure: TRANSESOPHAGEAL ECHOCARDIOGRAM (TEE);  Surgeon: Orpah Cobb, MD;  Location: [facility] ENDOSCOPY;  Service: Cardiovascular;  Laterality: N/A;   Family History  Problem Relation Age of Onset  . Heart attack Mother   . Heart attack Father   . Hypertension Mother   . Hypertension Father    Social History  Substance Use Topics  . Smoking  status: Former Smoker -- 1.00 packs/day for 30 years    Types: Cigarettes    Quit date: 05/02/2014  . Smokeless tobacco: Never Used  . Alcohol Use: Yes     Comment: drank 19 40 oz beers today    Review of Systems  All other systems reviewed and are negative.     Allergies  Bee venom  Home Medications   Prior to Admission medications   Medication Sig Start Date End Date Taking? Authorizing Provider  albuterol (PROVENTIL HFA;VENTOLIN HFA) 108 (90 BASE) MCG/ACT inhaler Inhale 2 puffs into the lungs every 6 (six) hours as needed for wheezing or shortness of breath. 07/08/14  Yes Standley Brooking, MD  aspirin 325 MG tablet Take 325 mg by mouth every 6 (six) hours as needed for mild pain.   Yes Historical Provider, MD  ibuprofen (ADVIL,MOTRIN) 200 MG tablet Take 200 mg by mouth every 6 (six) hours as needed for moderate pain.   Yes Historical Provider, MD  apixaban (ELIQUIS) 5 MG TABS tablet Take 1 tablet (5 mg total) by mouth 2 (two) times daily. 06/04/15   Linwood Dibbles, MD  atorvastatin (LIPITOR) 40 MG tablet Take 1 tablet (40 mg total) by mouth daily at 6 PM. 06/04/15   Linwood Dibbles, MD  digoxin (LANOXIN) 0.25 MG tablet Take 1 tablet (0.25 mg total) by mouth daily. 06/04/15   Linwood Dibbles, MD  furosemide (LASIX) 20  MG tablet Take 1 tablet (20 mg total) by mouth daily. 06/04/15   Linwood Dibbles, MD  lisinopril (PRINIVIL,ZESTRIL) 2.5 MG tablet Take 1 tablet (2.5 mg total) by mouth daily. 06/04/15   Linwood Dibbles, MD  metoprolol (LOPRESSOR) 50 MG tablet Take 1 tablet (50 mg total) by mouth 2 (two) times daily. 06/04/15   Linwood Dibbles, MD  potassium chloride SA (K-DUR,KLOR-CON) 20 MEQ tablet Take 1 tablet (20 mEq total) by mouth daily. 06/04/15   Linwood Dibbles, MD   BP 105/86 mmHg  Pulse 104  Temp(Src) 97.5 F (36.4 C) (Oral)  Resp 24  SpO2 97% Physical Exam  Constitutional: He appears well-developed and well-nourished. No distress.  HENT:  Head: Normocephalic and atraumatic.  Right Ear: External ear normal.  Left Ear:  External ear normal.  Eyes: Conjunctivae are normal. Right eye exhibits no discharge. Left eye exhibits no discharge. No scleral icterus.  Neck: Neck supple. No tracheal deviation present.  Cardiovascular: Intact distal pulses.  An irregularly irregular rhythm present. Tachycardia present.   Pulmonary/Chest: Effort normal and breath sounds normal. No stridor. No respiratory distress. He has no wheezes. He has no rales.  Abdominal: Soft. Bowel sounds are normal. He exhibits no distension. There is no tenderness. There is no rebound and no guarding.  Musculoskeletal: He exhibits no edema or tenderness.  Neurological: He is alert. He has normal strength. No cranial nerve deficit (no facial droop, extraocular movements intact, no slurred speech) or sensory deficit. He exhibits normal muscle tone. He displays no seizure activity. Coordination normal.  Skin: Skin is warm and dry. No rash noted.  Psychiatric: He has a normal mood and affect.  Nursing note and vitals reviewed.   ED Course  Procedures  Medications  apixaban (ELIQUIS) tablet 5 mg (5 mg Oral Not Given 06/04/15 1129)  diltiazem (CARDIZEM) 1 mg/mL load via infusion 15 mg (15 mg Intravenous Given 06/04/15 0825)  metoprolol tartrate (LOPRESSOR) tablet 50 mg (50 mg Oral Given 06/04/15 1130)  digoxin (LANOXIN) 0.25 MG/ML injection 0.5 mg (0.5 mg Intravenous Given 06/04/15 1131)     Labs Review Labs Reviewed  PROTIME-INR - Abnormal; Notable for the following:    Prothrombin Time 24.1 (*)    INR 2.19 (*)    All other components within normal limits  CBC WITH DIFFERENTIAL/PLATELET - Abnormal; Notable for the following:    MCV 101.4 (*)    All other components within normal limits  COMPREHENSIVE METABOLIC PANEL - Abnormal; Notable for the following:    CO2 19 (*)    Glucose, Bld 107 (*)    BUN 23 (*)    Creatinine, Ser 1.34 (*)    Total Protein 5.9 (*)    Albumin 3.3 (*)    AST 48 (*)    Total Bilirubin 1.3 (*)    All other components  within normal limits  APTT  I-STAT TROPOININ, ED    Imaging Review Dg Chest Portable 1 View  06/04/2015  CLINICAL DATA:  Atrial fibrillation EXAM: PORTABLE CHEST 1 VIEW COMPARISON:  05/14/2015 FINDINGS: Mild bilateral interstitial thickening. There is no focal parenchymal opacity. There is no pleural effusion or pneumothorax. There is stable cardiomegaly. The osseous structures are unremarkable. IMPRESSION: Cardiomegaly with mild pulmonary vascular congestion. Electronically Signed   By: Elige Ko   On: 06/04/2015 09:05   I have personally reviewed and evaluated these images and lab results as part of my medical decision-making.   EKG Interpretation   Date/Time:  Thursday Jun 04 2015 07:43:17  EDT Ventricular Rate:  149 PR Interval:    QRS Duration: 86 QT Interval:  304 QTC Calculation: 478 R Axis:   92 Text Interpretation:  Atrial fibrillation with rapid ventricular response  with premature ventricular or aberrantly conducted complexes Rightward  axis Anterior infarct , age undetermined Abnormal ECG Since last tracing  rate faster Confirmed by Kerrie Timm  MD-J, Candice Tobey ([id number]) on 06/04/2015 7:56:15 AM  Also confirmed by [facility]  MD-J, Deangelo Berns ([id number]), editor Whitney Post, Cala Bradford [phone])   on 06/04/2015 9:11:52 AM      MDM   Final diagnoses:  Atrial fibrillation with rapid ventricular response [facility])    Discussed case with Dr Sharyn Lull.  Pt has not been compliant with his medications.   He was in the hospital recently.  IF we can control his rate he does need to be admitted.  I will dc his cardizem drip and give him an oral dose of metoprolol and reassess.  1237  Pt remains stable.  HR is now in the 70s and 80s off of the cardizem drip.  I provided refills of his medications.  Case management has assisted and instructed pt on how to get his medications.  Stable for discharge.  Follow up with Dr Sharyn Lull tomorrow   Linwood Dibbles, MD 06/04/15 [phone]

## 2015-06-04 NOTE — ED Notes (Signed)
Pt came in POV for Afib.  Pt does have a hx and reports he has been unable to purchase his medications x1 yr.  Pt was hospitalized last month for same.  Pt hr 140-160.

## 2015-06-04 NOTE — Progress Notes (Signed)
Spoke to patient regarding primary care resources and the [facility] orange card. Patient is established with care with Chales Abrahams at the [facility]. Patients orange card has expired at this time. Patient was informed to meet me at the [facility] Friday Jun 12, 2015 at 8:00am to renew his orange card. Patient expressed concerns regarding his medication at the [facility] due to his orange card being expired. Patient sts his medication has been filled by the Health Dept but he cannot obtain them due to the cost. C.Wood, NCM notified of this issue. My contact information provided for any future questions or concerns. No other [facility] & Eligibility Specialist needs identified.  Buddy Duty [facility] & Eligibility Specialist [facility]  [phone]

## 2015-06-04 NOTE — Discharge Instructions (Signed)

## 2015-06-05 MED FILL — LISINOPRIL 2.5 MG TABLET: 2.5 | 30 days supply | Qty: 30 | Fill #0

## 2015-06-05 MED FILL — ?DIGITEK 250 MCG TABLET: 250 | 3 days supply | Qty: 3 | Fill #0

## 2015-06-05 MED FILL — ?METOPROLOL 50 MG TABLET: 50 | 30 days supply | Qty: 60 | Fill #0

## 2015-06-05 MED FILL — ELIQUIS 5 MG TABLET: 5 | 30 days supply | Qty: 60 | Fill #0

## 2015-06-05 MED FILL — POTASSIUM CL ER 20 MEQ TAB: 20 | 30 days supply | Qty: 30 | Fill #0

## 2015-06-05 MED FILL — ?ATORVASTATIN 40MG TABLET: 40 | 30 days supply | Qty: 30 | Fill #0

## 2015-06-05 MED FILL — FUROSEMIDE 20 MG TABLET: 20 | 30 days supply | Qty: 30 | Fill #0

## 2015-06-09 ENCOUNTER — Encounter (HOSPITAL_COMMUNITY): Payer: Self-pay

## 2015-06-09 ENCOUNTER — Inpatient Hospital Stay (HOSPITAL_COMMUNITY)
Admission: EM | Admit: 2015-06-09 | Discharge: 2015-06-13 | DRG: 308 | Disposition: A | Discharge status: Home / Self Care | Payer: Medicaid Other | Attending: Internal Medicine | Admitting: Internal Medicine

## 2015-06-09 ENCOUNTER — Emergency Department (HOSPITAL_COMMUNITY): Payer: Medicaid Other

## 2015-06-09 DIAGNOSIS — K649 Unspecified hemorrhoids: Secondary | ICD-10-CM | POA: Diagnosis present

## 2015-06-09 DIAGNOSIS — N183 Chronic kidney disease, stage 3 (moderate): Secondary | ICD-10-CM | POA: Diagnosis present

## 2015-06-09 DIAGNOSIS — Z8673 Personal history of transient ischemic attack (TIA), and cerebral infarction without residual deficits: Secondary | ICD-10-CM

## 2015-06-09 DIAGNOSIS — Z9119 Patient's noncompliance with other medical treatment and regimen: Secondary | ICD-10-CM

## 2015-06-09 DIAGNOSIS — F4310 Post-traumatic stress disorder, unspecified: Secondary | ICD-10-CM | POA: Diagnosis present

## 2015-06-09 DIAGNOSIS — I420 Dilated cardiomyopathy: Secondary | ICD-10-CM | POA: Diagnosis present

## 2015-06-09 DIAGNOSIS — J9601 Acute respiratory failure with hypoxia: Secondary | ICD-10-CM | POA: Diagnosis present

## 2015-06-09 DIAGNOSIS — I10 Essential (primary) hypertension: Secondary | ICD-10-CM | POA: Diagnosis present

## 2015-06-09 DIAGNOSIS — Z9114 Patient's other noncompliance with medication regimen: Secondary | ICD-10-CM

## 2015-06-09 DIAGNOSIS — N189 Chronic kidney disease, unspecified: Secondary | ICD-10-CM

## 2015-06-09 DIAGNOSIS — R109 Unspecified abdominal pain: Secondary | ICD-10-CM | POA: Diagnosis present

## 2015-06-09 DIAGNOSIS — F319 Bipolar disorder, unspecified: Secondary | ICD-10-CM | POA: Diagnosis present

## 2015-06-09 DIAGNOSIS — Z8249 Family history of ischemic heart disease and other diseases of the circulatory system: Secondary | ICD-10-CM

## 2015-06-09 DIAGNOSIS — Z79899 Other long term (current) drug therapy: Secondary | ICD-10-CM

## 2015-06-09 DIAGNOSIS — IMO0001 Reserved for inherently not codable concepts without codable children: Secondary | ICD-10-CM | POA: Diagnosis present

## 2015-06-09 DIAGNOSIS — G459 Transient cerebral ischemic attack, unspecified: Secondary | ICD-10-CM | POA: Diagnosis present

## 2015-06-09 DIAGNOSIS — F1020 Alcohol dependence, uncomplicated: Secondary | ICD-10-CM | POA: Diagnosis present

## 2015-06-09 DIAGNOSIS — N179 Acute kidney failure, unspecified: Secondary | ICD-10-CM | POA: Diagnosis present

## 2015-06-09 DIAGNOSIS — R195 Other fecal abnormalities: Secondary | ICD-10-CM | POA: Diagnosis present

## 2015-06-09 DIAGNOSIS — F419 Anxiety disorder, unspecified: Secondary | ICD-10-CM

## 2015-06-09 DIAGNOSIS — K625 Hemorrhage of anus and rectum: Secondary | ICD-10-CM | POA: Diagnosis present

## 2015-06-09 DIAGNOSIS — F329 Major depressive disorder, single episode, unspecified: Secondary | ICD-10-CM | POA: Diagnosis present

## 2015-06-09 DIAGNOSIS — I513 Intracardiac thrombosis, not elsewhere classified: Secondary | ICD-10-CM | POA: Diagnosis present

## 2015-06-09 DIAGNOSIS — Z7901 Long term (current) use of anticoagulants: Secondary | ICD-10-CM

## 2015-06-09 DIAGNOSIS — J441 Chronic obstructive pulmonary disease with (acute) exacerbation: Secondary | ICD-10-CM | POA: Diagnosis present

## 2015-06-09 DIAGNOSIS — I130 Hypertensive heart and chronic kidney disease with heart failure and stage 1 through stage 4 chronic kidney disease, or unspecified chronic kidney disease: Secondary | ICD-10-CM | POA: Diagnosis present

## 2015-06-09 DIAGNOSIS — I482 Chronic atrial fibrillation: Principal | ICD-10-CM | POA: Diagnosis present

## 2015-06-09 DIAGNOSIS — I252 Old myocardial infarction: Secondary | ICD-10-CM

## 2015-06-09 DIAGNOSIS — I5043 Acute on chronic combined systolic (congestive) and diastolic (congestive) heart failure: Secondary | ICD-10-CM | POA: Diagnosis present

## 2015-06-09 DIAGNOSIS — I4891 Unspecified atrial fibrillation: Secondary | ICD-10-CM

## 2015-06-09 DIAGNOSIS — I2510 Atherosclerotic heart disease of native coronary artery without angina pectoris: Secondary | ICD-10-CM | POA: Diagnosis present

## 2015-06-09 DIAGNOSIS — I5023 Acute on chronic systolic (congestive) heart failure: Secondary | ICD-10-CM | POA: Diagnosis present

## 2015-06-09 HISTORY — DX: Chronic kidney disease, stage 2 (mild): N18.2

## 2015-06-09 HISTORY — DX: Chronic obstructive pulmonary disease, unspecified: J44.9

## 2015-06-09 LAB — COMPREHENSIVE METABOLIC PANEL
ALBUMIN: 3.3 g/dL — AB (ref 3.5–5.0)
ALT: 106 U/L — AB (ref 17–63)
ANION GAP: 10 (ref 5–15)
AST: 91 U/L — ABNORMAL HIGH (ref 15–41)
Alkaline Phosphatase: 85 U/L (ref 38–126)
BUN: 31 mg/dL — ABNORMAL HIGH (ref 6–20)
CHLORIDE: 107 mmol/L (ref 101–111)
CO2: 25 mmol/L (ref 22–32)
CREATININE: 1.75 mg/dL — AB (ref 0.61–1.24)
Calcium: 9.4 mg/dL (ref 8.9–10.3)
GFR calc non Af Amer: 44 mL/min — ABNORMAL LOW (ref >60)
GFR, EST AFRICAN AMERICAN: 51 mL/min — AB (ref >60)
GLUCOSE: 98 mg/dL (ref 65–99)
Potassium: 5.1 mmol/L (ref 3.5–5.1)
SODIUM: 142 mmol/L (ref 135–145)
Total Bilirubin: 1.1 mg/dL (ref 0.3–1.2)
Total Protein: 6 g/dL — ABNORMAL LOW (ref 6.5–8.1)

## 2015-06-09 LAB — URINE MICROSCOPIC-ADD ON: Squamous Epithelial / LPF: NONE SEEN

## 2015-06-09 LAB — CBC
HCT: 46.9 % (ref 39.0–52.0)
HEMOGLOBIN: 15.3 g/dL (ref 13.0–17.0)
MCH: 32.8 pg (ref 26.0–34.0)
MCHC: 32.6 g/dL (ref 30.0–36.0)
MCV: 100.6 fL — AB (ref 78.0–100.0)
Platelets: 160 10*3/uL (ref 150–400)
RBC: 4.66 MIL/uL (ref 4.22–5.81)
RDW: 14.6 % (ref 11.5–15.5)
WBC: 6 10*3/uL (ref 4.0–10.5)

## 2015-06-09 LAB — URINALYSIS, ROUTINE W REFLEX MICROSCOPIC
Bilirubin Urine: NEGATIVE
GLUCOSE, UA: NEGATIVE mg/dL
HGB URINE DIPSTICK: NEGATIVE
Ketones, ur: NEGATIVE mg/dL
Leukocytes, UA: NEGATIVE
Nitrite: NEGATIVE
Protein, ur: >300 mg/dL — AB
SPECIFIC GRAVITY, URINE: 1.016 (ref 1.005–1.030)
pH: 5.5 (ref 5.0–8.0)

## 2015-06-09 LAB — LIPASE, BLOOD: LIPASE: 27 U/L (ref 11–51)

## 2015-06-09 MED ORDER — GI COCKTAIL ~~LOC~~
30.0000 mL | Freq: Once | ORAL | Status: AC
  Administered 2015-06-10: 30 mL via ORAL
  Filled 2015-06-09: qty 30

## 2015-06-09 MED ORDER — LORAZEPAM 2 MG/ML IJ SOLN
1.0000 mg | Freq: Once | INTRAMUSCULAR | Status: AC
  Administered 2015-06-09: 1 mg via INTRAVENOUS
  Filled 2015-06-09: qty 1

## 2015-06-09 MED ORDER — DICYCLOMINE HCL 10 MG PO CAPS
10.0000 mg | ORAL_CAPSULE | Freq: Once | ORAL | Status: AC
  Administered 2015-06-09: 10 mg via ORAL
  Filled 2015-06-09: qty 1

## 2015-06-09 NOTE — ED Provider Notes (Signed)
CSN: [id number]     Arrival date & time 06/09/15  1958 History   First MD Initiated Contact with Patient 06/09/15 2234      (Consider location/radiation/quality/duration/timing/severity/associated sxs/prior Treatment) HPI   Blood pressure 128/113, pulse 114, temperature 97.8 F (36.6 C), temperature source Oral, resp. rate 18, height  (1.676 m), weight 82.101 kg, SpO2 100 %.  [name] is a 50 y.o. male with past medical history significant for A. fib, anticoagulated with Xarelto, CVA, ACS, anxiety and former alcoholic complaining of acute onset of severe chest pain with associated palpitations at 5 PM this afternoon, patient had a feeling of impending doom and associated abdominal pain with 2 episodes of nonbloody, nonbilious, non-coffee ground emesis. Patient had episode of chest pain which resolved spontaneously, he has associated productive cough and shortness of breath. Patient also reports intermittent bright red blood per rectum over several years, states it's worse over the last few days. Patient denies melena, active alcohol use.    Past Medical History  Diagnosis Date  . Hypertension   . Alcoholism /alcohol abuse (HCC)   . Anxiety and depression   . Tobacco abuse   . Atrial fibrillation (HCC)   . Anxiety   . Depression   . Myocardial infarction (HCC)   . Stroke (HCC)   . History of hiatal hernia   . Headache    Past Surgical History  Procedure Laterality Date  . Hernia repair      UHR x2  . Tee without cardioversion N/A 08/01/2014    Procedure: TRANSESOPHAGEAL ECHOCARDIOGRAM (TEE);  Surgeon: Orpah Cobb, MD;  Location: [facility] ENDOSCOPY;  Service: Cardiovascular;  Laterality: N/A;  . Fracture surgery      right FA  . Tee without cardioversion N/A 05/15/2015    Procedure: TRANSESOPHAGEAL ECHOCARDIOGRAM (TEE);  Surgeon: Orpah Cobb, MD;  Location: [facility] ENDOSCOPY;  Service: Cardiovascular;  Laterality: N/A;   Family History  Problem Relation Age of Onset  . Heart attack  Mother   . Heart attack Father   . Hypertension Mother   . Hypertension Father    Social History  Substance Use Topics  . Smoking status: Former Smoker -- 1.00 packs/day for 30 years    Types: Cigarettes    Quit date: 05/02/2014  . Smokeless tobacco: Never Used  . Alcohol Use: Yes     Comment: drank 19 40 oz beers today    Review of Systems  10 systems reviewed and found to be negative, except as noted in the HPI.   Allergies  Bee venom  Home Medications   Prior to Admission medications   Medication Sig Start Date End Date Taking? Authorizing Provider  albuterol (PROVENTIL HFA;VENTOLIN HFA) 108 (90 BASE) MCG/ACT inhaler Inhale 2 puffs into the lungs every 6 (six) hours as needed for wheezing or shortness of breath. 07/08/14   Standley Brooking, MD  apixaban (ELIQUIS) 5 MG TABS tablet Take 1 tablet (5 mg total) by mouth 2 (two) times daily. 06/04/15   Linwood Dibbles, MD  aspirin 325 MG tablet Take 325 mg by mouth every 6 (six) hours as needed for mild pain.    Historical Provider, MD  atorvastatin (LIPITOR) 40 MG tablet Take 1 tablet (40 mg total) by mouth daily at 6 PM. 06/04/15   Linwood Dibbles, MD  digoxin (LANOXIN) 0.25 MG tablet Take 1 tablet (0.25 mg total) by mouth daily. 06/04/15   Linwood Dibbles, MD  furosemide (LASIX) 20 MG tablet Take 1 tablet (20 mg total) by  mouth daily. 06/04/15   Linwood Dibbles, MD  ibuprofen (ADVIL,MOTRIN) 200 MG tablet Take 200 mg by mouth every 6 (six) hours as needed for moderate pain.    Historical Provider, MD  lisinopril (PRINIVIL,ZESTRIL) 2.5 MG tablet Take 1 tablet (2.5 mg total) by mouth daily. 06/04/15   Linwood Dibbles, MD  metoprolol (LOPRESSOR) 50 MG tablet Take 1 tablet (50 mg total) by mouth 2 (two) times daily. 06/04/15   Linwood Dibbles, MD  potassium chloride SA (K-DUR,KLOR-CON) 20 MEQ tablet Take 1 tablet (20 mEq total) by mouth daily. 06/04/15   Linwood Dibbles, MD   BP 128/113 mmHg  Pulse 114  Temp(Src) 97.8 F (36.6 C) (Oral)  Resp 18  Ht 5\' 6"  (1.676 m)  Wt 82.101 kg   BMI 29.23 kg/m2  SpO2 100% Physical Exam  Constitutional: He is oriented to person, place, and time. He appears well-developed and well-nourished. No distress.  HENT:  Head: Normocephalic.  Mouth/Throat: Oropharynx is clear and moist.  Eyes: Conjunctivae and EOM are normal. Pupils are equal, round, and reactive to light.  Neck: Normal range of motion.  Cardiovascular:  Irregular, tachycardic  Pulmonary/Chest: Effort normal. No stridor. No respiratory distress. He has wheezes. He has no rales. He exhibits no tenderness.  Expiratory wheezing  Abdominal: Soft.  Abdominal pain distractible.  Genitourinary:  Digital rectal exam is chaperoned by PA student Stefanie: Positive internal and external hemorrhoids, normal stool colored stool  Musculoskeletal: Normal range of motion.  Neurological: He is alert and oriented to person, place, and time.  Agitated and anxious  Psychiatric: He has a normal mood and affect.  Nursing note and vitals reviewed.   ED Course  Procedures (including critical care time) Labs Review Labs Reviewed  COMPREHENSIVE METABOLIC PANEL - Abnormal; Notable for the following:    BUN 31 (*)    Creatinine, Ser 1.75 (*)    Total Protein 6.0 (*)    Albumin 3.3 (*)    AST 91 (*)    ALT 106 (*)    GFR calc non Af Amer 44 (*)    GFR calc Af Amer 51 (*)    All other components within normal limits  CBC - Abnormal; Notable for the following:    MCV 100.6 (*)    All other components within normal limits  URINALYSIS, ROUTINE W REFLEX MICROSCOPIC (NOT AT [facility]) - Abnormal; Notable for the following:    Protein, ur >300 (*)    All other components within normal limits  URINE MICROSCOPIC-ADD ON - Abnormal; Notable for the following:    Bacteria, UA RARE (*)    Casts HYALINE CASTS (*)    All other components within normal limits  BRAIN NATRIURETIC PEPTIDE - Abnormal; Notable for the following:    B Natriuretic Peptide 1590.7 (*)    All other components within normal  limits  POC OCCULT BLOOD, ED - Abnormal; Notable for the following:    Fecal Occult Bld POSITIVE (*)    All other components within normal limits  LIPASE, BLOOD  ETHANOL  URINE RAPID DRUG SCREEN, HOSP PERFORMED  I-STAT TROPOININ, ED    Imaging Review Dg Chest 2 View  06/09/2015  CLINICAL DATA:  Shortness of breath, chest tightness and syncope. EXAM: CHEST - 2 VIEW COMPARISON:  06/04/2015 FINDINGS: Stable moderate cardiac enlargement. Diffuse interstitial edema present without overt airspace edema or associated pleural effusions. No pneumothorax or airspace consolidation. Bony structures are unremarkable. IMPRESSION: Cardiomegaly with diffuse interstitial edema. Electronically Signed   By: Sherrine Maples  Fredia Sorrow M.D.   On: 06/09/2015 21:03   I have personally reviewed and evaluated these images and lab results as part of my medical decision-making.   EKG Interpretation   Date/Time:  Tuesday Jun 09 2015 22:38:50 EDT Ventricular Rate:  118 PR Interval:    QRS Duration: 89 QT Interval:  345 QTC Calculation: 483 R Axis:   75 Text Interpretation:  Atrial fibrillation Borderline low voltage,  extremity leads Nonspecific T abnormalities, lateral leads Confirmed by  [facility] MD, JULIE ([id number]) on 06/09/2015 10:44:55 PM      MDM   Final diagnoses:  Atrial fibrillation with rapid ventricular response (HCC)  COPD exacerbation (HCC)  Hemorrhoids, unspecified hemorrhoid type  Chronic anticoagulation   Filed Vitals:   06/09/15 2006 06/09/15 2235  BP: 95/67 128/113  Pulse: 98 114  Temp: 97.4 F (36.3 C) 97.8 F (36.6 C)  TempSrc: Oral Oral  Resp: 18 18  Height: 5\' 6"  (1.676 m)   Weight: 82.101 kg   SpO2: 100% 100%    Medications  gi cocktail (Maalox,Lidocaine,Donnatal) (not administered)  predniSONE (DELTASONE) tablet 60 mg (not administered)  ipratropium-albuterol (DUONEB) 0.5-2.5 (3) MG/3ML nebulizer solution 3 mL (not administered)  diltiazem (CARDIZEM) 1 mg/mL load via infusion 15  mg (not administered)    And  diltiazem (CARDIZEM) 100 mg in dextrose 5 % 100 mL (1 mg/mL) infusion (not administered)  LORazepam (ATIVAN) injection 1 mg (1 mg Intravenous Given 06/09/15 2348)  dicyclomine (BENTYL) capsule 10 mg (10 mg Oral Given 06/09/15 2348)    [name] is 50 y.o. male presenting with Chest pain and palpitations, EKG with A. fib, his heart rate is going in between 110 and 150, he does appear quite agitated and anxious. Patient is given Ativan and he does appear more, however his heart rate remains elevated.   Patient with transaminitis, mild AST and ALT around 100, creatinine elevated at 1.75 which is not atypical for his baseline. No clinical signs of volume overload, chest x-ray does show cardiomegaly with interstitial edema. Will check a proBNP. Troponin negative, Pro BNP is elevated at 1500.   Digital rectal exam with external hemorrhoids, normal stool color, guaiac positive. No anemia, I do not think this is a true GI bleed, patient states that this has been happening for many years.  Patient feels much improved after GI cocktail however heart rate remains significantly elevated  Case discussed with cardiologist Dr. Sharyn Lull who agrees with initiating IV Cardizem bolus and drip to control rate, he asked for hospitalization admission and he will evaluate him in the morning.  Case discussed with triad hospitalist Dr. Clyde Lundborg accepts admission to a step down bed      [facility], PA-C 06/10/15 0108  Laurence Spates, MD 06/11/15 7001

## 2015-06-09 NOTE — ED Notes (Signed)
PER EMS: pt sent to triage upon arrival. Pt c/o lower abdominal pain with bright red blood in bowel movements that started about an hour PTA. O2-99%, lungs sound clear. BP- 138/98, HR-90-120 and in afib, hx of afib. Pt also reports SOB, productive cough of yellow sputum.

## 2015-06-10 ENCOUNTER — Observation Stay (HOSPITAL_COMMUNITY): Payer: Medicaid Other

## 2015-06-10 ENCOUNTER — Observation Stay (HOSPITAL_BASED_OUTPATIENT_CLINIC_OR_DEPARTMENT_OTHER): Payer: Medicaid Other

## 2015-06-10 ENCOUNTER — Encounter (HOSPITAL_COMMUNITY): Payer: Self-pay | Admitting: Internal Medicine

## 2015-06-10 DIAGNOSIS — Z8249 Family history of ischemic heart disease and other diseases of the circulatory system: Secondary | ICD-10-CM | POA: Diagnosis not present

## 2015-06-10 DIAGNOSIS — I5023 Acute on chronic systolic (congestive) heart failure: Secondary | ICD-10-CM

## 2015-06-10 DIAGNOSIS — Z8673 Personal history of transient ischemic attack (TIA), and cerebral infarction without residual deficits: Secondary | ICD-10-CM | POA: Diagnosis not present

## 2015-06-10 DIAGNOSIS — F1020 Alcohol dependence, uncomplicated: Secondary | ICD-10-CM | POA: Diagnosis present

## 2015-06-10 DIAGNOSIS — I2510 Atherosclerotic heart disease of native coronary artery without angina pectoris: Secondary | ICD-10-CM | POA: Diagnosis present

## 2015-06-10 DIAGNOSIS — Z9119 Patient's noncompliance with other medical treatment and regimen: Secondary | ICD-10-CM | POA: Diagnosis not present

## 2015-06-10 DIAGNOSIS — K649 Unspecified hemorrhoids: Secondary | ICD-10-CM | POA: Diagnosis present

## 2015-06-10 DIAGNOSIS — Z79899 Other long term (current) drug therapy: Secondary | ICD-10-CM | POA: Diagnosis not present

## 2015-06-10 DIAGNOSIS — I130 Hypertensive heart and chronic kidney disease with heart failure and stage 1 through stage 4 chronic kidney disease, or unspecified chronic kidney disease: Secondary | ICD-10-CM | POA: Diagnosis present

## 2015-06-10 DIAGNOSIS — I513 Intracardiac thrombosis, not elsewhere classified: Secondary | ICD-10-CM | POA: Diagnosis present

## 2015-06-10 DIAGNOSIS — N179 Acute kidney failure, unspecified: Secondary | ICD-10-CM | POA: Diagnosis present

## 2015-06-10 DIAGNOSIS — I420 Dilated cardiomyopathy: Secondary | ICD-10-CM | POA: Diagnosis present

## 2015-06-10 DIAGNOSIS — G459 Transient cerebral ischemic attack, unspecified: Secondary | ICD-10-CM

## 2015-06-10 DIAGNOSIS — I509 Heart failure, unspecified: Secondary | ICD-10-CM

## 2015-06-10 DIAGNOSIS — F4310 Post-traumatic stress disorder, unspecified: Secondary | ICD-10-CM | POA: Diagnosis present

## 2015-06-10 DIAGNOSIS — I252 Old myocardial infarction: Secondary | ICD-10-CM | POA: Diagnosis not present

## 2015-06-10 DIAGNOSIS — R1033 Periumbilical pain: Secondary | ICD-10-CM

## 2015-06-10 DIAGNOSIS — R109 Unspecified abdominal pain: Secondary | ICD-10-CM | POA: Diagnosis present

## 2015-06-10 DIAGNOSIS — N183 Chronic kidney disease, stage 3 (moderate): Secondary | ICD-10-CM | POA: Diagnosis present

## 2015-06-10 DIAGNOSIS — Z7901 Long term (current) use of anticoagulants: Secondary | ICD-10-CM | POA: Diagnosis not present

## 2015-06-10 DIAGNOSIS — R195 Other fecal abnormalities: Secondary | ICD-10-CM | POA: Diagnosis present

## 2015-06-10 DIAGNOSIS — N189 Chronic kidney disease, unspecified: Secondary | ICD-10-CM

## 2015-06-10 DIAGNOSIS — J441 Chronic obstructive pulmonary disease with (acute) exacerbation: Secondary | ICD-10-CM | POA: Diagnosis present

## 2015-06-10 DIAGNOSIS — I482 Chronic atrial fibrillation: Secondary | ICD-10-CM | POA: Diagnosis not present

## 2015-06-10 DIAGNOSIS — I5043 Acute on chronic combined systolic (congestive) and diastolic (congestive) heart failure: Secondary | ICD-10-CM | POA: Diagnosis present

## 2015-06-10 DIAGNOSIS — J9600 Acute respiratory failure, unspecified whether with hypoxia or hypercapnia: Secondary | ICD-10-CM

## 2015-06-10 DIAGNOSIS — K625 Hemorrhage of anus and rectum: Secondary | ICD-10-CM | POA: Diagnosis present

## 2015-06-10 DIAGNOSIS — Z9114 Patient's other noncompliance with medication regimen: Secondary | ICD-10-CM | POA: Diagnosis not present

## 2015-06-10 DIAGNOSIS — F319 Bipolar disorder, unspecified: Secondary | ICD-10-CM | POA: Diagnosis present

## 2015-06-10 DIAGNOSIS — J9601 Acute respiratory failure with hypoxia: Secondary | ICD-10-CM | POA: Diagnosis present

## 2015-06-10 LAB — CBC
HCT: 47.4 % (ref 39.0–52.0)
HCT: 48.9 % (ref 39.0–52.0)
HEMATOCRIT: 45.2 % (ref 39.0–52.0)
Hemoglobin: 15 g/dL (ref 13.0–17.0)
Hemoglobin: 16.1 g/dL (ref 13.0–17.0)
Hemoglobin: 16.8 g/dL (ref 13.0–17.0)
MCH: 32.8 pg (ref 26.0–34.0)
MCH: 32.9 pg (ref 26.0–34.0)
MCH: 34.3 pg — ABNORMAL HIGH (ref 26.0–34.0)
MCHC: 33.2 g/dL (ref 30.0–36.0)
MCHC: 34 g/dL (ref 30.0–36.0)
MCHC: 34.4 g/dL (ref 30.0–36.0)
MCV: 98.7 fL (ref 78.0–100.0)
MCV: 96.9 fL (ref 78.0–100.0)
MCV: 99.8 fL (ref 78.0–100.0)
PLATELETS: 149 10*3/uL — AB (ref 150–400)
PLATELETS: 164 10*3/uL (ref 150–400)
Platelets: 172 10*3/uL (ref 150–400)
RBC: 4.58 MIL/uL (ref 4.22–5.81)
RBC: 4.89 MIL/uL (ref 4.22–5.81)
RBC: 4.9 MIL/uL (ref 4.22–5.81)
RDW: 14.5 % (ref 11.5–15.5)
RDW: 14.3 % (ref 11.5–15.5)
RDW: 14.4 % (ref 11.5–15.5)
WBC: 6.1 10*3/uL (ref 4.0–10.5)
WBC: 4.5 10*3/uL (ref 4.0–10.5)
WBC: 5.9 10*3/uL (ref 4.0–10.5)

## 2015-06-10 LAB — BRAIN NATRIURETIC PEPTIDE: B NATRIURETIC PEPTIDE 5: 1590.7 pg/mL — AB (ref 0.0–100.0)

## 2015-06-10 LAB — RESPIRATORY PANEL BY PCR
ADENOVIRUS-RVPPCR: NOT DETECTED
BORDETELLA PERTUSSIS-RVPCR: NOT DETECTED
Chlamydophila pneumoniae: NOT DETECTED
Coronavirus 229E: NOT DETECTED
Coronavirus HKU1: NOT DETECTED
Coronavirus NL63: NOT DETECTED
Coronavirus OC43: NOT DETECTED
INFLUENZA A H1 2009-RVPPR: NOT DETECTED
INFLUENZA A H1-RVPPCR: NOT DETECTED
INFLUENZA A-RVPPCR: NOT DETECTED
Influenza A H3: NOT DETECTED
Influenza B: NOT DETECTED
METAPNEUMOVIRUS-RVPPCR: NOT DETECTED
MYCOPLASMA PNEUMONIAE-RVPPCR: NOT DETECTED
PARAINFLUENZA VIRUS 2-RVPPCR: NOT DETECTED
Parainfluenza Virus 1: NOT DETECTED
Parainfluenza Virus 3: NOT DETECTED
Parainfluenza Virus 4: NOT DETECTED
RESPIRATORY SYNCYTIAL VIRUS-RVPPCR: NOT DETECTED
RHINOVIRUS / ENTEROVIRUS - RVPPCR: NOT DETECTED

## 2015-06-10 LAB — ETHANOL: Alcohol, Ethyl (B): <5 mg/dL (ref <5)

## 2015-06-10 LAB — RAPID URINE DRUG SCREEN, HOSP PERFORMED
AMPHETAMINES: NOT DETECTED
BENZODIAZEPINES: NOT DETECTED
Barbiturates: NOT DETECTED
COCAINE: NOT DETECTED
OPIATES: NOT DETECTED
TETRAHYDROCANNABINOL: NOT DETECTED

## 2015-06-10 LAB — PROTIME-INR
INR: 1.73 — ABNORMAL HIGH (ref 0.00–1.49)
PROTHROMBIN TIME: 20.2 s — AB (ref 11.6–15.2)

## 2015-06-10 LAB — LIPID PANEL
Cholesterol: 134 mg/dL (ref 0–200)
HDL: 49 mg/dL (ref >40)
LDL CALC: 68 mg/dL (ref 0–99)
TRIGLYCERIDES: 85 mg/dL (ref <150)
Total CHOL/HDL Ratio: 2.7 RATIO
VLDL: 17 mg/dL (ref 0–40)

## 2015-06-10 LAB — APTT: APTT: 37 s (ref 24–37)

## 2015-06-10 LAB — I-STAT TROPONIN, ED: Troponin i, poc: 0.01 ng/mL (ref 0.00–0.08)

## 2015-06-10 LAB — BASIC METABOLIC PANEL
ANION GAP: 16 — AB (ref 5–15)
BUN: 23 mg/dL — ABNORMAL HIGH (ref 6–20)
CALCIUM: 9.2 mg/dL (ref 8.9–10.3)
CO2: 18 mmol/L — ABNORMAL LOW (ref 22–32)
Chloride: 106 mmol/L (ref 101–111)
Creatinine, Ser: 1.28 mg/dL — ABNORMAL HIGH (ref 0.61–1.24)
GLUCOSE: 127 mg/dL — AB (ref 65–99)
Potassium: 4.6 mmol/L (ref 3.5–5.1)
SODIUM: 140 mmol/L (ref 135–145)

## 2015-06-10 LAB — TROPONIN I
TROPONIN I: 0.04 ng/mL — AB (ref <0.031)
TROPONIN I: 0.03 ng/mL (ref <0.031)
Troponin I: 0.05 ng/mL — ABNORMAL HIGH (ref <0.031)

## 2015-06-10 LAB — MRSA PCR SCREENING: MRSA BY PCR: NEGATIVE

## 2015-06-10 LAB — ECHOCARDIOGRAM COMPLETE
Height: 66 in
Weight: 2920.65 oz

## 2015-06-10 LAB — MAGNESIUM: MAGNESIUM: 1.5 mg/dL — AB (ref 1.7–2.4)

## 2015-06-10 LAB — POC OCCULT BLOOD, ED: Fecal Occult Bld: POSITIVE — AB

## 2015-06-10 LAB — CREATININE, URINE, RANDOM: Creatinine, Urine: 13.52 mg/dL

## 2015-06-10 MED ORDER — DILTIAZEM LOAD VIA INFUSION
15.0000 mg | Freq: Once | INTRAVENOUS | Status: AC
  Administered 2015-06-10: 15 mg via INTRAVENOUS
  Filled 2015-06-10: qty 15

## 2015-06-10 MED ORDER — SODIUM CHLORIDE 0.9 % IV SOLN
INTRAVENOUS | Status: DC
  Administered 2015-06-10: 12:00:00 via INTRAVENOUS

## 2015-06-10 MED ORDER — AZITHROMYCIN 500 MG PO TABS
500.0000 mg | ORAL_TABLET | Freq: Every day | ORAL | Status: AC
  Administered 2015-06-10: 500 mg via ORAL
  Filled 2015-06-10: qty 1

## 2015-06-10 MED ORDER — DM-GUAIFENESIN ER 30-600 MG PO TB12
1.0000 | ORAL_TABLET | Freq: Two times a day (BID) | ORAL | Status: DC
  Administered 2015-06-10 (×3): 1 via ORAL
  Filled 2015-06-10 (×4): qty 1

## 2015-06-10 MED ORDER — OXYCODONE-ACETAMINOPHEN 5-325 MG PO TABS
2.0000 | ORAL_TABLET | Freq: Four times a day (QID) | ORAL | Status: DC | PRN
  Administered 2015-06-12: 2 via ORAL
  Filled 2015-06-10: qty 2

## 2015-06-10 MED ORDER — DIGOXIN 125 MCG PO TABS
0.2500 mg | ORAL_TABLET | Freq: Every day | ORAL | Status: DC
  Administered 2015-06-10 – 2015-06-13 (×4): 0.25 mg via ORAL
  Filled 2015-06-10 (×2): qty 1
  Filled 2015-06-10: qty 2
  Filled 2015-06-10: qty 1

## 2015-06-10 MED ORDER — PREDNISONE 50 MG PO TABS
60.0000 mg | ORAL_TABLET | Freq: Every day | ORAL | Status: DC
  Administered 2015-06-10: 60 mg via ORAL
  Filled 2015-06-10: qty 1

## 2015-06-10 MED ORDER — METOPROLOL TARTRATE 25 MG PO TABS: 50.0000 mg | ORAL_TABLET | Freq: Once | ORAL | Status: DC

## 2015-06-10 MED ORDER — NITROGLYCERIN 0.4 MG SL SUBL: 0.4000 mg | SUBLINGUAL_TABLET | SUBLINGUAL | Status: DC | PRN

## 2015-06-10 MED ORDER — AZITHROMYCIN 250 MG PO TABS
250.0000 mg | ORAL_TABLET | Freq: Every day | ORAL | Status: DC
  Administered 2015-06-11 – 2015-06-13 (×3): 250 mg via ORAL
  Filled 2015-06-10 (×3): qty 1

## 2015-06-10 MED ORDER — ACETAMINOPHEN 325 MG PO TABS
650.0000 mg | ORAL_TABLET | ORAL | Status: DC | PRN
  Administered 2015-06-13: 650 mg via ORAL
  Filled 2015-06-10: qty 2

## 2015-06-10 MED ORDER — ADULT MULTIVITAMIN W/MINERALS CH
1.0000 | ORAL_TABLET | Freq: Every day | ORAL | Status: DC
  Administered 2015-06-10 – 2015-06-13 (×4): 1 via ORAL
  Filled 2015-06-10 (×4): qty 1

## 2015-06-10 MED ORDER — METOPROLOL TARTRATE 25 MG PO TABS: 50.0000 mg | ORAL_TABLET | Freq: Two times a day (BID) | ORAL | Status: DC

## 2015-06-10 MED ORDER — PREDNISONE 20 MG PO TABS
60.0000 mg | ORAL_TABLET | Freq: Once | ORAL | Status: AC
  Administered 2015-06-10: 60 mg via ORAL
  Filled 2015-06-10: qty 3

## 2015-06-10 MED ORDER — METOPROLOL TARTRATE 25 MG PO TABS
25.0000 mg | ORAL_TABLET | Freq: Three times a day (TID) | ORAL | Status: DC
  Administered 2015-06-10: 25 mg via ORAL
  Filled 2015-06-10: qty 1

## 2015-06-10 MED ORDER — IPRATROPIUM-ALBUTEROL 0.5-2.5 (3) MG/3ML IN SOLN: 3.0000 mL | Freq: Four times a day (QID) | RESPIRATORY_TRACT | Status: DC

## 2015-06-10 MED ORDER — VITAMIN B-1 100 MG PO TABS
100.0000 mg | ORAL_TABLET | Freq: Every day | ORAL | Status: DC
  Administered 2015-06-11 – 2015-06-13 (×3): 100 mg via ORAL
  Filled 2015-06-10 (×3): qty 1

## 2015-06-10 MED ORDER — IPRATROPIUM BROMIDE 0.02 % IN SOLN
0.5000 mg | RESPIRATORY_TRACT | Status: DC
  Administered 2015-06-10: 0.5 mg via RESPIRATORY_TRACT
  Filled 2015-06-10: qty 2.5

## 2015-06-10 MED ORDER — LORAZEPAM 2 MG/ML IJ SOLN
0.0000 mg | Freq: Two times a day (BID) | INTRAMUSCULAR | Status: DC
  Administered 2015-06-12 – 2015-06-13 (×2): 2 mg via INTRAVENOUS
  Filled 2015-06-10 (×2): qty 1

## 2015-06-10 MED ORDER — DILTIAZEM HCL ER COATED BEADS 180 MG PO CP24
180.0000 mg | ORAL_CAPSULE | Freq: Every day | ORAL | Status: DC
  Administered 2015-06-10: 180 mg via ORAL
  Filled 2015-06-10: qty 1

## 2015-06-10 MED ORDER — HYDRALAZINE HCL 20 MG/ML IJ SOLN
5.0000 mg | INTRAMUSCULAR | Status: DC | PRN
Start: 2015-06-10 — End: 2015-06-11

## 2015-06-10 MED ORDER — LEVALBUTEROL HCL 1.25 MG/0.5ML IN NEBU
1.2500 mg | INHALATION_SOLUTION | Freq: Four times a day (QID) | RESPIRATORY_TRACT | Status: DC
  Administered 2015-06-10: 1.25 mg via RESPIRATORY_TRACT
  Filled 2015-06-10: qty 0.5

## 2015-06-10 MED ORDER — LORAZEPAM 2 MG/ML IJ SOLN: 1.0000 mg | Freq: Four times a day (QID) | INTRAMUSCULAR | Status: AC | PRN

## 2015-06-10 MED ORDER — DILTIAZEM HCL 100 MG IV SOLR
5.0000 mg/h | INTRAVENOUS | Status: DC
  Administered 2015-06-10: 5 mg/h via INTRAVENOUS
  Filled 2015-06-10: qty 100

## 2015-06-10 MED ORDER — FOLIC ACID 1 MG PO TABS
1.0000 mg | ORAL_TABLET | Freq: Every day | ORAL | Status: DC
  Administered 2015-06-10 – 2015-06-13 (×4): 1 mg via ORAL
  Filled 2015-06-10 (×4): qty 1

## 2015-06-10 MED ORDER — METOPROLOL TARTRATE 25 MG PO TABS: 25.0000 mg | ORAL_TABLET | Freq: Two times a day (BID) | ORAL | Status: DC

## 2015-06-10 MED ORDER — ATORVASTATIN CALCIUM 40 MG PO TABS
40.0000 mg | ORAL_TABLET | Freq: Every day | ORAL | Status: DC
  Administered 2015-06-10 – 2015-06-12 (×2): 40 mg via ORAL
  Filled 2015-06-10 (×2): qty 1

## 2015-06-10 MED ORDER — IPRATROPIUM BROMIDE 0.02 % IN SOLN
0.5000 mg | Freq: Three times a day (TID) | RESPIRATORY_TRACT | Status: DC
  Administered 2015-06-10 – 2015-06-11 (×4): 0.5 mg via RESPIRATORY_TRACT
  Filled 2015-06-10 (×4): qty 2.5

## 2015-06-10 MED ORDER — ONDANSETRON HCL 4 MG/2ML IJ SOLN: 4.0000 mg | Freq: Four times a day (QID) | INTRAMUSCULAR | Status: DC | PRN

## 2015-06-10 MED ORDER — METOPROLOL TARTRATE 25 MG PO TABS
37.5000 mg | ORAL_TABLET | Freq: Two times a day (BID) | ORAL | Status: DC
  Filled 2015-06-10: qty 1

## 2015-06-10 MED ORDER — ASPIRIN 325 MG PO TABS
325.0000 mg | ORAL_TABLET | Freq: Every day | ORAL | Status: DC
  Administered 2015-06-10: 325 mg via ORAL
  Filled 2015-06-10 (×2): qty 1

## 2015-06-10 MED ORDER — LORAZEPAM 2 MG/ML IJ SOLN
0.0000 mg | Freq: Four times a day (QID) | INTRAMUSCULAR | Status: AC
  Administered 2015-06-10 (×2): 2 mg via INTRAVENOUS
  Administered 2015-06-11: 4 mg via INTRAVENOUS
  Administered 2015-06-11: 2 mg via INTRAVENOUS
  Filled 2015-06-10: qty 1
  Filled 2015-06-10: qty 2
  Filled 2015-06-10 (×3): qty 1

## 2015-06-10 MED ORDER — LEVALBUTEROL HCL 1.25 MG/0.5ML IN NEBU
1.2500 mg | INHALATION_SOLUTION | Freq: Three times a day (TID) | RESPIRATORY_TRACT | Status: DC
  Administered 2015-06-10 – 2015-06-11 (×4): 1.25 mg via RESPIRATORY_TRACT
  Filled 2015-06-10 (×4): qty 0.5

## 2015-06-10 MED ORDER — PREDNISONE 20 MG PO TABS
20.0000 mg | ORAL_TABLET | Freq: Every day | ORAL | Status: DC
  Administered 2015-06-11 – 2015-06-13 (×3): 20 mg via ORAL
  Filled 2015-06-10 (×3): qty 1

## 2015-06-10 MED ORDER — LORAZEPAM 1 MG PO TABS
1.0000 mg | ORAL_TABLET | Freq: Four times a day (QID) | ORAL | Status: AC | PRN
  Administered 2015-06-11: 1 mg via ORAL
  Filled 2015-06-10: qty 1

## 2015-06-10 MED ORDER — FUROSEMIDE 10 MG/ML IJ SOLN
40.0000 mg | Freq: Two times a day (BID) | INTRAMUSCULAR | Status: DC
  Administered 2015-06-10 – 2015-06-12 (×5): 40 mg via INTRAVENOUS
  Filled 2015-06-10 (×5): qty 4

## 2015-06-10 MED ORDER — APIXABAN 5 MG PO TABS
5.0000 mg | ORAL_TABLET | Freq: Two times a day (BID) | ORAL | Status: DC
Start: 2015-06-10 — End: 2015-06-13
  Administered 2015-06-10 – 2015-06-13 (×8): 5 mg via ORAL
  Filled 2015-06-10 (×8): qty 1

## 2015-06-10 MED ORDER — CETYLPYRIDINIUM CHLORIDE 0.05 % MT LIQD
7.0000 mL | Freq: Two times a day (BID) | OROMUCOSAL | Status: DC
  Administered 2015-06-10 – 2015-06-13 (×5): 7 mL via OROMUCOSAL

## 2015-06-10 MED ORDER — IPRATROPIUM-ALBUTEROL 0.5-2.5 (3) MG/3ML IN SOLN
3.0000 mL | Freq: Once | RESPIRATORY_TRACT | Status: AC
Start: 2015-06-10 — End: 2015-06-10
  Administered 2015-06-10: 3 mL via RESPIRATORY_TRACT
  Filled 2015-06-10: qty 3

## 2015-06-10 MED ORDER — METOPROLOL TARTRATE 50 MG PO TABS: 50.0000 mg | ORAL_TABLET | Freq: Two times a day (BID) | ORAL | Status: DC

## 2015-06-10 MED ORDER — ASPIRIN 325 MG PO TABS: 325.0000 mg | ORAL_TABLET | Freq: Four times a day (QID) | ORAL | Status: DC | PRN

## 2015-06-10 MED ORDER — FUROSEMIDE 10 MG/ML IJ SOLN
20.0000 mg | Freq: Two times a day (BID) | INTRAMUSCULAR | Status: DC
  Administered 2015-06-10: 20 mg via INTRAVENOUS
  Filled 2015-06-10: qty 2

## 2015-06-10 MED ORDER — METOPROLOL TARTRATE 25 MG PO TABS
25.0000 mg | ORAL_TABLET | Freq: Two times a day (BID) | ORAL | Status: DC
  Administered 2015-06-10: 25 mg via ORAL
  Filled 2015-06-10: qty 1

## 2015-06-10 MED ORDER — THIAMINE HCL 100 MG/ML IJ SOLN
100.0000 mg | Freq: Every day | INTRAMUSCULAR | Status: DC
  Administered 2015-06-10: 100 mg via INTRAVENOUS
  Filled 2015-06-10: qty 2

## 2015-06-10 MED ORDER — PANTOPRAZOLE SODIUM 40 MG IV SOLR
40.0000 mg | Freq: Two times a day (BID) | INTRAVENOUS | Status: DC
  Administered 2015-06-10 (×3): 40 mg via INTRAVENOUS
  Filled 2015-06-10 (×3): qty 40

## 2015-06-10 NOTE — Evaluation (Signed)
Physical Therapy Evaluation Patient Details Name: [name] MRN: [id number] DOB: 1965-03-16 Today's Date: 06/10/2015   History of Present Illness  This 50 year old man was admitted with rectal bleeding, abdominal pain, and SOB.  PMH significant for COPD, CHF with EF 15%, depression, anxiety, ETOH, CAD, MI, TIA, CVA, and CKD  Clinical Impression  Patient presents with decreased mobility due to deficits listed in PT problem list.  He will benefit from skilled PT in the acute setting to allow return home with intermittent family support and follow up HHPT if able (no insurance).    Follow Up Recommendations Home health PT;Supervision - Intermittent    Equipment Recommendations  Rolling walker with 5" wheels    Recommendations for Other Services       Precautions / Restrictions Precautions Precautions: Fall      Mobility  Bed Mobility               General bed mobility comments: up in chair  Transfers Overall transfer level: Needs assistance Equipment used: Rolling walker (2 wheeled) Transfers: Sit to/from Stand Sit to Stand: Min guard         General transfer comment: up from low recliner  Ambulation/Gait Ambulation/Gait assistance: Min assist Ambulation Distance (Feet): 150 Feet Assistive device: Rolling walker (2 wheeled) Gait Pattern/deviations: Step-through pattern;Decreased stride length;Trunk flexed     General Gait Details: one LOB in hallway and one prior to leaving room min A to rebalance, second one pt c/o lightheadedness and legs shaky; BP after ambulation 125/94, HR 103, SpO2 94% on 2L O2  Stairs            Wheelchair Mobility    Modified Rankin (Stroke Patients Only)       Balance Overall balance assessment: Needs assistance   Sitting balance-Leahy Scale: Good     Standing balance support: Bilateral upper extremity supported Standing balance-Leahy Scale: Poor Standing balance comment: UE support needed for balance                              Pertinent Vitals/Pain Pain Assessment: Faces Faces Pain Scale: Hurts a little bit Pain Location: some abdominal pain, but reports it is better Pain Descriptors / Indicators: Aching Pain Intervention(s): Monitored during session    Home Living Family/patient expects to be discharged to:: Private residence Living Arrangements: Alone Available Help at Discharge: Family;Available PRN/intermittently Type of Home: House Home Access: Level entry     Home Layout: Two level Home Equipment: None      Prior Function Level of Independence: Independent               Hand Dominance        Extremity/Trunk Assessment               Lower Extremity Assessment: Generalized weakness         Communication   Communication: No difficulties  Cognition Arousal/Alertness: Awake/alert Behavior During Therapy: WFL for tasks assessed/performed Overall Cognitive Status: Within Functional Limits for tasks assessed                      General Comments General comments (skin integrity, edema, etc.): pt emotional talking about medical decline and felt he was dying.    Exercises        Assessment/Plan    PT Assessment Patient needs continued PT services  PT Diagnosis Abnormality of gait;Generalized weakness   PT Problem List Decreased  strength;Decreased activity tolerance;Decreased balance;Decreased mobility;Decreased safety awareness;Decreased knowledge of use of DME  PT Treatment Interventions DME instruction;Balance training;Functional mobility training;Patient/family education;Gait training;Therapeutic activities;Therapeutic exercise   PT Goals (Current goals can be found in the Care Plan section) Acute Rehab PT Goals Patient Stated Goal: To go home in two days PT Goal Formulation: With patient Time For Goal Achievement: 06/15/15 Potential to Achieve Goals: Good    Frequency Min 3X/week   Barriers to discharge         Co-evaluation               End of Session Equipment Utilized During Treatment: Oxygen Activity Tolerance: Patient tolerated treatment well Patient left: in chair;with call bell/phone within reach;with chair alarm set           Time: 2637-8588 PT Time Calculation (min) (ACUTE ONLY): 23 min   Charges:   PT Evaluation $PT Eval Moderate Complexity: 1 Procedure PT Treatments $Gait Training: 8-22 mins   PT G CodesElray Mcgregor 2015/07/07, 5:19 PM  Sheran Lawless, PT [phone] 2015/07/07

## 2015-06-10 NOTE — H&P (Signed)
History and Physical    [name] ZOX:[id number] DOB: 12-28-1965 DOA: 06/09/2015  Referring MD/NP/PA:   PCP: Jacklynn Barnacle, NP   Outpatient Specialists: Cardiology, Dr. Sharyn Lull  Patient coming from:  Home   Chief Complaint: Rectal bleeding, abdominal pain, shortness of breath, productive cough  HPI: [name] is a 50 y.o. male with medical history significant of formal smoker, COPD, systolic congestive heart failure with EF of 15%, depression, anxiety, alcohol abuse, atrial fibrillation on Eliquis,  CAD, myocardial infarction, TIA, stroke, chronic kidney disease-stage II, medication noncompliance,  left atrial thrombsis (per Dr. Annitta Jersey note, pt had left atrial thrombosis by TEE on 07/2014), who presents with rectal bleeding, abdominal pain, shortness breath, productive cough.   Pt reports he has been having intermittent rectal bleeding for severe years. He reports that he had 4 episode of rectal bleeding with small amount of bright colored blood in the stool. He also complains of abdominal pain. It is located in periumbilical area, constant, 8/10 in severity, nonradiating. It is associated with nausea, and vomiting. He reports that he vomited red colored materials which he thinks likely to be blood. He does not have diarrhea. He stated that he never had EGD or colonoscopy before.   Patient states that he has cough with small amount of yellowish sputum production. He also has mild chest pain in the central area, constant, nonradiating. Patient does not have fever or chills. He states that he quit smoking and alcohol long time ago. She state that he has been compliant with all his medications, including Lasix. He reports that his dry weight is about 150-160 pounds, but his body weight is 181 Lbs today. Patient denies symptoms of UTI or unilateral weakness.  ED Course: pt was found to have EKG with A. fib with RVR with heart rate up to 150 in ED, elevated BNP 1590.7, positive FOBT with  hemoglobin, hemoglobin 15.3. negative UDS, troponin negative, lipase 27, WBC 6.0, temperature normal, worsening renal function. Chest x-ray showed diffuse interstitial edema. Pt is placed on stepdown bed of observation. Cardiology, Dr. Sharyn Lull was consulted.   Review of Systems:   General: no fevers, chills, no changes in body weight, has poor appetite, has fatigue HEENT: no blurry vision, hearing changes or sore throat Pulm: has dyspnea, coughing, wheezing CV: has chest pain, no palpitations Abd: no nausea, vomiting, abdominal pain, no diarrhea, constipation GU: no dysuria, burning on urination, increased urinary frequency, hematuria  Ext: no leg edema Neuro: no unilateral weakness, numbness, or tingling, no vision change or hearing loss Skin: no rash MSK: No muscle spasm, no deformity, no limitation of range of movement in spin Heme: No easy bruising.  Travel history: No recent long distant travel.  Allergy:  Allergies  Allergen Reactions  . Bee Venom Anaphylaxis    Uses epi pen-- last used 08/23/11    Past Medical History  Diagnosis Date  . Hypertension   . Alcoholism /alcohol abuse (HCC)   . Anxiety and depression   . Tobacco abuse   . Atrial fibrillation (HCC)   . Anxiety   . Depression   . Myocardial infarction (HCC)   . Stroke (HCC)   . History of hiatal hernia   . Headache   . CKD (chronic kidney disease), stage II   . COPD (chronic obstructive pulmonary disease) [facility])     Past Surgical History  Procedure Laterality Date  . Hernia repair      UHR x2  . Tee without cardioversion N/A 08/01/2014  Procedure: TRANSESOPHAGEAL ECHOCARDIOGRAM (TEE);  Surgeon: Orpah Cobb, MD;  Location: [facility] ENDOSCOPY;  Service: Cardiovascular;  Laterality: N/A;  . Fracture surgery      right FA  . Tee without cardioversion N/A 05/15/2015    Procedure: TRANSESOPHAGEAL ECHOCARDIOGRAM (TEE);  Surgeon: Orpah Cobb, MD;  Location: [facility] ENDOSCOPY;  Service: Cardiovascular;  Laterality: N/A;      Social History:  reports that he quit smoking about 13 months ago. His smoking use included Cigarettes. He has a 30 pack-year smoking history. He has never used smokeless tobacco. He reports that he drinks alcohol. He reports that he does not use illicit drugs.  Family History:  Family History  Problem Relation Age of Onset  . Heart attack Mother   . Heart attack Father   . Hypertension Mother   . Hypertension Father      Prior to Admission medications   Medication Sig Start Date End Date Taking? Authorizing Provider  albuterol (PROVENTIL HFA;VENTOLIN HFA) 108 (90 BASE) MCG/ACT inhaler Inhale 2 puffs into the lungs every 6 (six) hours as needed for wheezing or shortness of breath. 07/08/14   Standley Brooking, MD  apixaban (ELIQUIS) 5 MG TABS tablet Take 1 tablet (5 mg total) by mouth 2 (two) times daily. 06/04/15   Linwood Dibbles, MD  aspirin 325 MG tablet Take 325 mg by mouth every 6 (six) hours as needed for mild pain.    Historical Provider, MD  atorvastatin (LIPITOR) 40 MG tablet Take 1 tablet (40 mg total) by mouth daily at 6 PM. 06/04/15   Linwood Dibbles, MD  digoxin (LANOXIN) 0.25 MG tablet Take 1 tablet (0.25 mg total) by mouth daily. 06/04/15   Linwood Dibbles, MD  furosemide (LASIX) 20 MG tablet Take 1 tablet (20 mg total) by mouth daily. 06/04/15   Linwood Dibbles, MD  ibuprofen (ADVIL,MOTRIN) 200 MG tablet Take 200 mg by mouth every 6 (six) hours as needed for moderate pain.    Historical Provider, MD  lisinopril (PRINIVIL,ZESTRIL) 2.5 MG tablet Take 1 tablet (2.5 mg total) by mouth daily. 06/04/15   Linwood Dibbles, MD  metoprolol (LOPRESSOR) 50 MG tablet Take 1 tablet (50 mg total) by mouth 2 (two) times daily. 06/04/15   Linwood Dibbles, MD  potassium chloride SA (K-DUR,KLOR-CON) 20 MEQ tablet Take 1 tablet (20 mEq total) by mouth daily. 06/04/15   Linwood Dibbles, MD    Physical Exam: Filed Vitals:   06/10/15 0045 06/10/15 0100 06/10/15 0115 06/10/15 0117  BP: 117/97 110/95 116/96   Pulse:   95   Temp:      TempSrc:       Resp: 30 28 34   Height:      Weight:      SpO2: 93%  91% 92%   General: Not in acute distress HEENT:       Eyes: PERRL, EOMI, no scleral icterus.       ENT: No discharge from the ears and nose, no pharynx injection, no tonsillar enlargement.        Neck: positive JVD, no bruit, no mass felt. Heme: No neck lymph node enlargement. Cardiac: S1/S2, irregularly irregular rhythm, soft 1/6 systolic murmurs, No gallops or rubs. Pulm: has fine crackles and mild wheezing bilaterally. No rubs. Abd: Soft, nondistended, tenderness over periumbilical area, no rebound pain, no organomegaly, BS present. EDP did rectal exam and noted hemorrhoid. GU: No hematuria Ext: No pitting leg edema bilaterally. 2+DP/PT pulse bilaterally. Musculoskeletal: No joint deformities, No joint redness or warmth, no  limitation of ROM in spin. Skin: No rashes.  Neuro: Alert, oriented X3, cranial nerves II-XII grossly intact, moves all extremities normally. Psych: Patient is not psychotic, no suicidal or hemocidal ideation.  Labs on Admission: I have personally reviewed following labs and imaging studies  CBC:  Recent Labs Lab 06/04/15 0810 06/09/15 2023  WBC 6.0 6.0  NEUTROABS 3.6  --   HGB 14.8 15.3  HCT 44.7 46.9  MCV 101.4* 100.6*  PLT 156 160   Basic Metabolic Panel:  Recent Labs Lab 06/04/15 0810 06/09/15 2023  NA 141 142  K 4.4 5.1  CL 110 107  CO2 19* 25  GLUCOSE 107* 98  BUN 23* 31*  CREATININE 1.34* 1.75*  CALCIUM 9.1 9.4   GFR: Estimated Creatinine Clearance: 50.8 mL/min (by C-G formula based on Cr of 1.75). Liver Function Tests:  Recent Labs Lab 06/04/15 0810 06/09/15 2023  AST 48* 91*  ALT 43 106*  ALKPHOS 55 85  BILITOT 1.3* 1.1  PROT 5.9* 6.0*  ALBUMIN 3.3* 3.3*    Recent Labs Lab 06/09/15 2023  LIPASE 27   No results for input(s): AMMONIA in the last 168 hours. Coagulation Profile:  Recent Labs Lab 06/04/15 0810  INR 2.19*   Cardiac Enzymes: No results  for input(s): CKTOTAL, CKMB, CKMBINDEX, TROPONINI in the last 168 hours. BNP (last 3 results) No results for input(s): PROBNP in the last 8760 hours. HbA1C: No results for input(s): HGBA1C in the last 72 hours. CBG: No results for input(s): GLUCAP in the last 168 hours. Lipid Profile: No results for input(s): CHOL, HDL, LDLCALC, TRIG, CHOLHDL, LDLDIRECT in the last 72 hours. Thyroid Function Tests: No results for input(s): TSH, T4TOTAL, FREET4, T3FREE, THYROIDAB in the last 72 hours. Anemia Panel: No results for input(s): VITAMINB12, FOLATE, FERRITIN, TIBC, IRON, RETICCTPCT in the last 72 hours. Urine analysis:    Component Value Date/Time   COLORURINE YELLOW 06/09/2015 2032   APPEARANCEUR CLEAR 06/09/2015 2032   LABSPEC 1.016 06/09/2015 2032   PHURINE 5.5 06/09/2015 2032   GLUCOSEU NEGATIVE 06/09/2015 2032   HGBUR NEGATIVE 06/09/2015 2032   BILIRUBINUR NEGATIVE 06/09/2015 2032   KETONESUR NEGATIVE 06/09/2015 2032   PROTEINUR >300* 06/09/2015 2032   UROBILINOGEN 0.2 10/18/2014 1914   NITRITE NEGATIVE 06/09/2015 2032   LEUKOCYTESUR NEGATIVE 06/09/2015 2032   Sepsis Labs: @LABRCNTIP (procalcitonin:4,lacticidven:4) )No results found for this or any previous visit (from the past 240 hour(s)).   Radiological Exams on Admission: Dg Chest 2 View  06/09/2015  CLINICAL DATA:  Shortness of breath, chest tightness and syncope. EXAM: CHEST - 2 VIEW COMPARISON:  06/04/2015 FINDINGS: Stable moderate cardiac enlargement. Diffuse interstitial edema present without overt airspace edema or associated pleural effusions. No pneumothorax or airspace consolidation. Bony structures are unremarkable. IMPRESSION: Cardiomegaly with diffuse interstitial edema. Electronically Signed   By: Irish Lack M.D.   On: 06/09/2015 21:03     EKG: Independently reviewed.QTC 483, atrial fibrillation with RVR  Assessment/Plan Principal Problem:   Atrial fibrillation with RVR (HCC) Active Problems:   Alcoholism  /alcohol abuse (HCC)   Anxiety and depression   Hypertension   COPD exacerbation (HCC)   Acute on chronic systolic (congestive) heart failure (HCC)   TIA (transient ischemic attack)   Abdominal pain   Acute on chronic kidney failure-II   Rectal bleeding   Atrial fibrillation with RVR (HCC): HR is upper to 150 in ED. EDP consulted cardiology, Dr. Eliot Ford, who recommended starting Cardizem drip. Dr. Sharyn Lull will see pt in the morning.  RVR is likely triggered by CHF exacerbation and COPD exacerbation. CHA2DS2-VASc Score is 4, needs oral anticoagulation. Patient is on Eliquis at home. Patient reports that he has rectal bleeding, which has been going on for several years. His hemoglobin is 15.3. It is likely due to minor hemorrhoidal bleeding. Given his history of left atrial thrombosis, and low EF 15%, I will continue Eliquis now and closely monitor his Hgb level by cbc q6h.   -Will admit to SDU for obs -started cardizem gtt in ED. -Continue home metoprolol -treat CHF and COPD exacerbation as below. -continue Eliquis and digoxin  Acute on chronic systolic (congestive) heart failure (HCC): 2-D echo on 07/03/14 showed EF 15%. Patient is on low-dose Lasix 20 mg daily. He does not have leg edema, but has positive JVD, elevated BNP 1590, and pulmonary edema on chest x-ray, consistent with CHF exacerbation.  -Lasix 20 mg bid by IV -trop x 3 -2d echo -Risk factor stratification: A1c, FLP -will continue metoprolol, ASA, but decrease metoprolol dose from 50-->25 mg 2 times a day -Hold lisinopril due to worsening renal function -Daily weights -strict I/O's -Low salt diet  COPD exacerbation: Patient has productive cough and wheezing on auscultation, consistent with mild COPD exacerbation. -Nebulizers: scheduled Atroven and prn xopenex -prednisonl 60 mg IV daily  -Oral azithromycin for 5 days.  -Mucinex for cough  -Follow up sputum culture, respiratory virus panel  Rectal bleeding: likely due to  minor hemorrhoidal bleeding given hemoglobin 15.1. He never had EGD or colonoscopy per patient. Cannot rule out other etiology, such as gastric ulcer or diverticulosis. He has ibuprofen on his medication list, but states that he is not taking this medication. -Hold ibuprofen -CBC every 6 hour -IV protonnix -may give GI referral if Hgb stable, otherwise please call GI in AM.  Abdominal pain: Etiology is not clear. Lipase is 27. Patient has abdominal tenderness over periumbilical area. No diarrhea. Likely due to alcoholic gastritis, cannot rule out other intra-abdominal abnormalities. -CT-abd/pelvis -prn percocet for pain. -IV protonix  Alcoholism /alcohol abuse [facility]): Patient states that he quit alcohol long time ago, but I feel like he is not reliable. -Start CiWA protocol -Did conseling about importance of quitting alcohol.  HTN: -on IV lasix -hold lisinopril due to worsening renal function. -IV hydralazine when necessary -Continue home metoprolol  Hx of TIA and stroke:  -continue ASA  AoCKD-II: Baseline Cre is 1.2, his creatinine is 1.75 on admission. Likely due to prerenal and continuation of ACEI, diruetics, NSAIDs. - Check FeUrea - Follow up renal function by BMP - Hold lisinopril and Ibuprofen   DVT ppx: on Elioquis Code Status: Full code Family Communication: None at bed side.  Disposition Plan:  Anticipate discharge back to previous home environment Consults called:  Card, Dr. Sharyn Lull Admission status: Obs / SDU  Date of Service 06/10/2015    Lorretta Harp [facility] Pager [phone]  If 7PM-7AM, please contact night-coverage [email or web address] Password [email or web address] 06/10/2015, 2:03 AM

## 2015-06-10 NOTE — Progress Notes (Signed)
PT Cancellation Note  Patient Details Name: [name] MRN: [id number] DOB: [birth year]   Cancelled Treatment:    Reason Eval/Treat Not Completed: Patient not medically ready. Noted order for serial Troponin I. First draw noted to be slightly elevated, will wait for second result and determine if OK to work with PT.   Zyiah Withington 06/10/2015, 8:44 AM  Pager [phone]

## 2015-06-10 NOTE — Progress Notes (Addendum)
Nutrition Brief Note  RD consulted for assessment of nutritional status/ needs for COPD GOLD protocol.  Wt Readings from Last 15 Encounters:  06/10/15 182 lb 8.7 oz (82.8 kg)  05/18/15 185 lb 1.6 oz (83.961 kg)  10/19/14 187 lb 2.7 oz (84.9 kg)  08/03/14 176 lb 9.6 oz (80.105 kg)  07/08/14 175 lb 3.2 oz (79.47 kg)  10/19/13 202 lb (91.627 kg)  10/19/13 202 lb (91.627 kg)  07/30/13 194 lb 14.2 oz (88.4 kg)  06/27/13 172 lb 6.4 oz (78.2 kg)  05/09/13 188 lb (85.276 kg)  05/09/13 186 lb 11.7 oz (84.7 kg)  01/19/13 161 lb (73.029 kg)  01/18/13 162 lb (73.483 kg)  01/18/13 177 lb 0.5 oz (80.3 kg)  10/15/11 205 lb (92.987 kg)   [name] is a 50 y.o. male with medical history significant of formal smoker, COPD, systolic congestive heart failure with EF of 15%, depression, anxiety, alcohol abuse, atrial fibrillation on Eliquis, CAD, myocardial infarction, TIA, stroke, chronic kidney disease-stage II, medication noncompliance, left atrial thrombsis (per Dr. Annitta Jersey note, pt had left atrial thrombosis by TEE on 07/2014), who presents with rectal bleeding, abdominal pain, shortness breath, productive cough.   Spoke with pt, who was sitting in recliner chair and consuming lunch at time of visit. He reports his appetite is fair. He voices that he is having difficulty transitioning to a healthier diet, but acknowledges importance of lifestyle modifications for his health. He shares with this RD that he typically consumes steaks and french fries for meals, however, was recently shopping for healthier, low sodium foods through the support of his boss, who goes grocery shopping with him.   Pt consumed all of his pasta and meat sauce and 50% of salad at conclusion of visit. He reports he ate "a brownie and some milk" for breakfast today. He denies any weight loss. Per MD notes dry weight is between 150-160#. Wt has been stable over the past year.   Discussed basic principles of of Heart Healthy diet  and importance of compliance.   Nutrition-Focused physical exam completed. Findings are no fat depletion, no muscle depletion, and no edema. Pt reports he is very active at baseline.   Body mass index is 29.48 kg/(m^2). Patient meets criteria for overweight based on current BMI.   Current diet order is Heart Healthy, patient is consuming approximately 80% of meals at this time. Labs and medications reviewed.   No nutrition interventions warranted at this time. If nutrition issues arise, please consult RD.   Jameila Keeny A. Mayford Knife, RD, LDN, CDE Pager: [phone] After hours Pager: [phone]

## 2015-06-10 NOTE — Discharge Instructions (Signed)

## 2015-06-10 NOTE — Progress Notes (Signed)
Echocardiogram 2D Echocardiogram has been performed.  [name] 06/10/2015, 9:33 AM

## 2015-06-10 NOTE — Progress Notes (Signed)
When pt arrived to the unit, he was alert, oriented and appropriate. Around 0345, pt had pulled off his leads, urinated on the floor, took off his gown and had incomprehensible speech. Got pt situated back in bed, asked him some orientation questions, only answered one question and was unable to follow commands. Then within 20 minutes, pt was more alert and able to follow commands. MD made aware. Will continue to monitor and update as needed.
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
## 2015-06-10 NOTE — Progress Notes (Signed)
No CPAP machines are available at this time. RT will place CPAP on patient when one becomes available.

## 2015-06-10 NOTE — Clinical Social Work Note (Signed)
CSW received referral for SNF.  PT is recommending home health.  CSW to sign off please re-consult if social work needs arise.  Ervin Knack. Geraldyne Barraclough, MSW, [employer] [phone]

## 2015-06-10 NOTE — Evaluation (Signed)
Occupational Therapy Evaluation Patient Details Name: [name] MRN: [id number] DOB: December 12, 1965 Today's Date: 06/10/2015    History of Present Illness This 50 year old man was admitted with rectal bleeding, abdominal pain, and SOB.  PMH significant for COPD, CHF with EF 15%, depression, anxiety, ETOH, CAD, MI, TIA, CVA, and CKD   Clinical Impression   Pt was admitted for the above.  Pt had some confusion during the evaluation.  He states that he is independent at baseline.  Pt was quite impulsive and needed multimodal cues.  He needs min A to min guard at this time. Goals are mostly for supervision level.    Follow Up Recommendations  Supervision/Assistance - 24 hour (pt confused; will update d/c on next visit)    Equipment Recommendations   (to be further assessed for shower DME)    Recommendations for Other Services       Precautions / Restrictions Precautions Precautions: Fall Restrictions Weight Bearing Restrictions: No      Mobility Bed Mobility               General bed mobility comments: supervision; assist for lines.  Pt is impulsive  Transfers Overall transfer level: Needs assistance Equipment used: None Transfers: Sit to/from [employer] Sit to Stand: Min guard Stand pivot transfers: Min guard       General transfer comment: for safety and lines    Balance                                            ADL Overall ADL's : Needs assistance/impaired     Grooming: Set up;Sitting;Wash/dry hands           Upper Body Dressing : Minimal assistance;Sitting   Lower Body Dressing: Min guard;Sit to/from stand   Toilet Transfer: Min guard;Stand-pivot (to recliner)             General ADL Comments: pt needed to use urinal twice during OT evaluation.  Had spillage when sitting EOB--I assisted him second time when he was sitting up in chair.  Pt moves quickly:  frequent cues for safety.  He was able to don socks  with set up.  Needs min guard for safety.  Pt had started to remove gown and needed min A and multimodal cues to put arms back through When he got up into the chair, HR monitor jumped around and pt noted to be in A-Fib.  HR 80;90s.  RN alerted.       Vision     Perception     Praxis      Pertinent Vitals/Pain Pain Assessment: No/denies pain     Hand Dominance     Extremity/Trunk Assessment Upper Extremity Assessment Upper Extremity Assessment: Difficult to assess due to impaired cognition (sleepiness; did not offer counter reistance to MMT)           Communication Communication Communication: No difficulties   Cognition Arousal/Alertness:  (I woke pt up: he dozed on and off during eval) Behavior During Therapy: Impulsive Overall Cognitive Status: Impaired/Different from baseline (likely) Area of Impairment: Safety/judgement               General Comments: Pt had some confusion.  I had woken him up just prior to OT evaluatino.  pt made tangential comments and then said "that's the bed that guy got out of".  Impulsive with  movement   General Comments       Exercises       Shoulder Instructions      Home Living Family/patient expects to be discharged to:: Private residence Living Arrangements: Alone     Home Access: Level entry           Bathroom Shower/Tub: Chief Strategy Officer: Standard     Home Equipment: None          Prior Functioning/Environment Level of Independence: Independent        Comments: per pt: he was sleepy at time of eval    OT Diagnosis: Generalized weakness;Cognitive deficits   OT Problem List: Decreased strength;Decreased activity tolerance;Decreased cognition;Decreased knowledge of use of DME or AE;Cardiopulmonary status limiting activity   OT Treatment/Interventions: Self-care/ADL training;DME and/or AE instruction;Patient/family education;Therapeutic activities;Cognitive remediation/compensation    OT  Goals(Current goals can be found in the care plan section) Acute Rehab OT Goals Patient Stated Goal: I'm hoping to go home today OT Goal Formulation: With patient Time For Goal Achievement: 06/17/15 Potential to Achieve Goals: Good ADL Goals Pt Will Transfer to Toilet: with supervision;ambulating;regular height toilet Pt Will Perform Tub/Shower Transfer: Tub transfer;with min guard assist;ambulating Additional ADL Goal #1: pt will gather clothes at supervision level and complete ADL without any cues for safety  OT Frequency: Min 2X/week   Barriers to D/C:            Co-evaluation              End of Session Nurse Communication:  (A Fib; confusion)  Activity Tolerance: Patient limited by fatigue Patient left: in chair;with call bell/phone within reach;with chair alarm set   Time: 1045-1109 OT Time Calculation (min): 24 min Charges:  OT General Charges $OT Visit: 1 Procedure OT Evaluation $OT Eval Moderate Complexity: 1 Procedure G-Codes: OT G-codes **NOT FOR INPATIENT CLASS** Functional Assessment Tool Used: clinical observation and judgment Functional Limitation: Self care Self Care Current Status (Z6109): At least 20 percent but less than 40 percent impaired, limited or restricted Self Care Goal Status (U0454): At least 1 percent but less than 20 percent impaired, limited or restricted  [facility] 06/10/2015, 11:22 AM Marica Otter, OTR/L [phone] 06/10/2015

## 2015-06-11 LAB — CBC WITH DIFFERENTIAL/PLATELET
Basophils Absolute: 0 10*3/uL (ref 0.0–0.1)
Basophils Relative: 0 %
EOS ABS: 0 10*3/uL (ref 0.0–0.7)
EOS PCT: 0 %
HCT: 46.8 % (ref 39.0–52.0)
Hemoglobin: 15.6 g/dL (ref 13.0–17.0)
LYMPHS ABS: 0.5 10*3/uL — AB (ref 0.7–4.0)
Lymphocytes Relative: 4 %
MCH: 32.7 pg (ref 26.0–34.0)
MCHC: 33.3 g/dL (ref 30.0–36.0)
MCV: 98.1 fL (ref 78.0–100.0)
MONOS PCT: 9 %
Monocytes Absolute: 1 10*3/uL (ref 0.1–1.0)
Neutro Abs: 9.7 10*3/uL — ABNORMAL HIGH (ref 1.7–7.7)
Neutrophils Relative %: 87 %
PLATELETS: 166 10*3/uL (ref 150–400)
RBC: 4.77 MIL/uL (ref 4.22–5.81)
RDW: 14.1 % (ref 11.5–15.5)
WBC: 11.1 10*3/uL — AB (ref 4.0–10.5)

## 2015-06-11 LAB — COMPREHENSIVE METABOLIC PANEL
ALT: 73 U/L — ABNORMAL HIGH (ref 17–63)
ANION GAP: 12 (ref 5–15)
AST: 48 U/L — ABNORMAL HIGH (ref 15–41)
Albumin: 3.1 g/dL — ABNORMAL LOW (ref 3.5–5.0)
Alkaline Phosphatase: 81 U/L (ref 38–126)
BUN: 27 mg/dL — ABNORMAL HIGH (ref 6–20)
CHLORIDE: 100 mmol/L — AB (ref 101–111)
CO2: 27 mmol/L (ref 22–32)
Calcium: 9.3 mg/dL (ref 8.9–10.3)
Creatinine, Ser: 1.4 mg/dL — ABNORMAL HIGH (ref 0.61–1.24)
GFR calc non Af Amer: 57 mL/min — ABNORMAL LOW (ref >60)
Glucose, Bld: 159 mg/dL — ABNORMAL HIGH (ref 65–99)
Potassium: 4.4 mmol/L (ref 3.5–5.1)
SODIUM: 139 mmol/L (ref 135–145)
Total Bilirubin: 1.2 mg/dL (ref 0.3–1.2)
Total Protein: 5.7 g/dL — ABNORMAL LOW (ref 6.5–8.1)

## 2015-06-11 LAB — HEMOGLOBIN A1C
Hgb A1c MFr Bld: 5.6 % (ref 4.8–5.6)
Mean Plasma Glucose: 114 mg/dL

## 2015-06-11 LAB — UREA NITROGEN, URINE: Urea Nitrogen, Ur: 131 mg/dL

## 2015-06-11 MED ORDER — METOPROLOL TARTRATE 50 MG PO TABS
50.0000 mg | ORAL_TABLET | Freq: Two times a day (BID) | ORAL | Status: DC
  Administered 2015-06-11 – 2015-06-12 (×3): 50 mg via ORAL
  Filled 2015-06-11 (×3): qty 1

## 2015-06-11 MED ORDER — METOPROLOL TARTRATE 50 MG PO TABS
50.0000 mg | ORAL_TABLET | Freq: Two times a day (BID) | ORAL | Status: DC
Start: 2015-06-11 — End: 2015-06-11

## 2015-06-11 MED ORDER — PANTOPRAZOLE SODIUM 40 MG PO TBEC
40.0000 mg | DELAYED_RELEASE_TABLET | Freq: Two times a day (BID) | ORAL | Status: DC
  Administered 2015-06-11 – 2015-06-13 (×5): 40 mg via ORAL
  Filled 2015-06-11 (×5): qty 1

## 2015-06-11 MED ORDER — DIGOXIN 0.25 MG/ML IJ SOLN
0.2500 mg | Freq: Every day | INTRAMUSCULAR | Status: AC
  Filled 2015-06-11 (×2): qty 2

## 2015-06-11 NOTE — Progress Notes (Signed)
Occupational Therapy Treatment Patient Details Name: [name] MRN: [id number] DOB: 27-Dec-1965 Today's Date: 06/11/2015    History of present illness This 50 year old man was admitted with rectal bleeding, abdominal pain, and SOB.  PMH significant for COPD, CHF with EF 15%, depression, anxiety, ETOH, CAD, MI, TIA, CVA, and CKD   OT comments  Pt remains a high falls risk.  He was not as impulsive today but lost balance when standing.   Follow Up Recommendations  Supervision/Assistance - 24 hour;SNF    Equipment Recommendations  None recommended by OT    Recommendations for Other Services      Precautions / Restrictions Precautions Precautions: Fall Restrictions Weight Bearing Restrictions: No       Mobility Bed Mobility               General bed mobility comments: pt up in chair  Transfers   Equipment used: Rolling walker (2 wheeled)   Sit to Stand: Min assist         General transfer comment: loss of balance when standing    Balance           Standing balance support: Bilateral upper extremity supported;During functional activity Standing balance-Leahy Scale: Poor                     ADL Overall ADL's : Needs assistance/impaired     Grooming: Oral care;Minimal assistance;Standing                                 General ADL Comments: stood to brush teeth with min A as pt lost balance when standing initially.  HR 90s to 122.  Sats in the high 90s on 3 liters and RR 20-35.  Did not walk to bathroom due to increased RR      Vision                     Perception     Praxis      Cognition   Behavior During Therapy: [facility] for tasks assessed/performed Overall Cognitive Status: No family/caregiver present to determine baseline cognitive functioning                  General Comments: decreased safety; decreased insight into deficits    Extremity/Trunk Assessment               Exercises      Shoulder Instructions       General Comments      Pertinent Vitals/ Pain       Pain Assessment: No/denies pain  Home Living                                          Prior Functioning/Environment              Frequency Min 2X/week     Progress Toward Goals  OT Goals(current goals can now be found in the care plan section)  Progress towards OT goals: Progressing toward goals  Acute Rehab OT Goals Patient Stated Goal: home tomorrow  Plan      Co-evaluation                 End of Session     Activity Tolerance  (limited due to RR)   Patient Left in  chair;with call bell/phone within reach;with chair alarm set   Nurse Communication  mobility and vitals        Time: [phone] OT Time Calculation (min): 18 min  Charges: OT General Charges $OT Visit: 1 Procedure OT Treatments $Self Care/Home Management : 8-22 mins  Toa Mia 06/11/2015, 4:27 PM  Marica Otter, OTR/L [phone] 06/11/2015

## 2015-06-11 NOTE — Progress Notes (Signed)
OT Cancellation Note  Patient Details Name: [name] MRN: [id number] DOB: 1965-07-18   Cancelled Treatment:    Reason Eval/Treat Not Completed: Other (comment).  Pt s/p ativan and just calmed down. Will try to return later if schedule permits.  Dari Carpenito 06/11/2015, 8:59 AM  Marica Otter, OTR/L [phone] 06/11/2015

## 2015-06-11 NOTE — Consult Note (Signed)
Reason for Consult:Atrial fibrillation with rapid ventricular response Referring Physician: Triad, hospitalist  [name] is an 50 y.o. male.  HPI: patient is 50 year old male with past medical history significant for nonischemic dilated cardiomyopathy, history of congestive heart failure secondary to depressed LV systolic function, hypertension, chronic atrial fibrillation, chads/score of 4, anxiety disorder, history of TIA in the past, EtOH abuse, tobacco abuse, noncompliant to medication and follow-up was admitted yesterday because of vague abdominal pain associated with rectal bleeding and was noted to be in A. Fib with RVR.  Patient was started on Cardizem drip with control of heart rate.  Patient was recently discharged from the hospital and was noted to have left atrial appendage thrombus on TEE and was not cardioverted and was discharged home on rate control medication and chronic anticoagulation with eliquis.  Patient did not follow up in the office and did not take his medications for a few days.  Patient denies any anginal chest pain.  Denies PND, orthopnea or leg swelling.  Cardiologic consultation is called as patient has recurrent episodes of A. Fib with RVR.  Past Medical History  Diagnosis Date  . Hypertension   . Alcoholism /alcohol abuse ([place])   . Anxiety and depression   . Tobacco abuse   . Atrial fibrillation ([place])   . Anxiety   . Depression   . Myocardial infarction ([place])   . Stroke ([place])   . History of hiatal hernia   . Headache   . CKD (chronic kidney disease), stage II   . COPD (chronic obstructive pulmonary disease) [facility])     Past Surgical History  Procedure Laterality Date  . Hernia repair      UHR x2  . Tee without cardioversion N/A 08/01/2014    Procedure: TRANSESOPHAGEAL ECHOCARDIOGRAM (TEE);  Surgeon: Dixie Dials, MD;  Location: [facility] ENDOSCOPY;  Service: Cardiovascular;  Laterality: N/A;  . Fracture surgery      right FA  . Tee without cardioversion N/A  05/15/2015    Procedure: TRANSESOPHAGEAL ECHOCARDIOGRAM (TEE);  Surgeon: Dixie Dials, MD;  Location: [facility] ENDOSCOPY;  Service: Cardiovascular;  Laterality: N/A;    Family History  Problem Relation Age of Onset  . Heart attack Mother   . Heart attack Father   . Hypertension Mother   . Hypertension Father     Social History:  reports that he quit smoking about 13 months ago. His smoking use included Cigarettes. He has a 30 pack-year smoking history. He has never used smokeless tobacco. He reports that he drinks alcohol. He reports that he does not use illicit drugs.  Allergies:  Allergies  Allergen Reactions  . Bee Venom Anaphylaxis    Uses epi pen-- last used 08/23/11    Medications: I have reviewed the patient's current medications.  Results for orders placed or performed during the hospital encounter of 06/09/15 (from the past 48 hour(s))  Brain natriuretic peptide     Status: Abnormal   Collection Time: 06/09/15  8:05 PM  Result Value Ref Range   B Natriuretic Peptide 1590.7 (H) 0.0 - 100.0 pg/mL  Lipase, blood     Status: None   Collection Time: 06/09/15  8:23 PM  Result Value Ref Range   Lipase 27 11 - 51 U/L  Comprehensive metabolic panel     Status: Abnormal   Collection Time: 06/09/15  8:23 PM  Result Value Ref Range   Sodium 142 135 - 145 mmol/L   Potassium 5.1 3.5 - 5.1 mmol/L   Chloride 107  101 - 111 mmol/L   CO2 25 22 - 32 mmol/L   Glucose, Bld 98 65 - 99 mg/dL   BUN 31 (H) 6 - 20 mg/dL   Creatinine, Ser 1.75 (H) 0.61 - 1.24 mg/dL   Calcium 9.4 8.9 - 10.3 mg/dL   Total Protein 6.0 (L) 6.5 - 8.1 g/dL   Albumin 3.3 (L) 3.5 - 5.0 g/dL   AST 91 (H) 15 - 41 U/L   ALT 106 (H) 17 - 63 U/L   Alkaline Phosphatase 85 38 - 126 U/L   Total Bilirubin 1.1 0.3 - 1.2 mg/dL   GFR calc non Af Amer 44 (L) >60 mL/min   GFR calc Af Amer 51 (L) >60 mL/min    Comment: (NOTE) The eGFR has been calculated using the CKD EPI equation. This calculation has not been validated in all  clinical situations. eGFR's persistently <60 mL/min signify possible Chronic Kidney Disease.    Anion gap 10 5 - 15  CBC     Status: Abnormal   Collection Time: 06/09/15  8:23 PM  Result Value Ref Range   WBC 6.0 4.0 - 10.5 K/uL   RBC 4.66 4.22 - 5.81 MIL/uL   Hemoglobin 15.3 13.0 - 17.0 g/dL   HCT 46.9 39.0 - 52.0 %   MCV 100.6 (H) 78.0 - 100.0 fL   MCH 32.8 26.0 - 34.0 pg   MCHC 32.6 30.0 - 36.0 g/dL   RDW 14.6 11.5 - 15.5 %   Platelets 160 150 - 400 K/uL  Urinalysis, Routine w reflex microscopic     Status: Abnormal   Collection Time: 06/09/15  8:32 PM  Result Value Ref Range   Color, Urine YELLOW YELLOW   APPearance CLEAR CLEAR   Specific Gravity, Urine 1.016 1.005 - 1.030   pH 5.5 5.0 - 8.0   Glucose, UA NEGATIVE NEGATIVE mg/dL   Hgb urine dipstick NEGATIVE NEGATIVE   Bilirubin Urine NEGATIVE NEGATIVE   Ketones, ur NEGATIVE NEGATIVE mg/dL   Protein, ur >300 (A) NEGATIVE mg/dL   Nitrite NEGATIVE NEGATIVE   Leukocytes, UA NEGATIVE NEGATIVE  Urine microscopic-add on     Status: Abnormal   Collection Time: 06/09/15  8:32 PM  Result Value Ref Range   Squamous Epithelial / LPF NONE SEEN NONE SEEN   WBC, UA 0-5 0 - 5 WBC/hpf   RBC / HPF 0-5 0 - 5 RBC/hpf   Bacteria, UA RARE (A) NONE SEEN   Casts HYALINE CASTS (A) NEGATIVE  Ethanol     Status: None   Collection Time: 06/09/15 11:48 PM  Result Value Ref Range   Alcohol, Ethyl (B) <5 <5 mg/dL    Comment:        LOWEST DETECTABLE LIMIT FOR SERUM ALCOHOL IS 5 mg/dL FOR MEDICAL PURPOSES ONLY   Urine rapid drug screen (hosp performed)     Status: None   Collection Time: 06/09/15 11:54 PM  Result Value Ref Range   Opiates NONE DETECTED NONE DETECTED   Cocaine NONE DETECTED NONE DETECTED   Benzodiazepines NONE DETECTED NONE DETECTED   Amphetamines NONE DETECTED NONE DETECTED   Tetrahydrocannabinol NONE DETECTED NONE DETECTED   Barbiturates NONE DETECTED NONE DETECTED    Comment:        DRUG SCREEN FOR MEDICAL  PURPOSES ONLY.  IF CONFIRMATION IS NEEDED FOR ANY PURPOSE, NOTIFY LAB WITHIN 5 DAYS.        LOWEST DETECTABLE LIMITS FOR URINE DRUG SCREEN Drug Class  Cutoff (ng/mL) Amphetamine      1000 Barbiturate      200 Benzodiazepine   163 Tricyclics       845 Opiates          300 Cocaine          300 THC              50   I-stat troponin, ED     Status: None   Collection Time: 06/10/15 12:21 AM  Result Value Ref Range   Troponin i, poc 0.01 0.00 - 0.08 ng/mL   Comment 3            Comment: Due to the release kinetics of cTnI, a negative result within the first hours of the onset of symptoms does not rule out myocardial infarction with certainty. If myocardial infarction is still suspected, repeat the test at appropriate intervals.   POC occult blood, ED     Status: Abnormal   Collection Time: 06/10/15 12:29 AM  Result Value Ref Range   Fecal Occult Bld POSITIVE (A) NEGATIVE  Respiratory Panel by PCR     Status: None   Collection Time: 06/10/15  2:30 AM  Result Value Ref Range   Adenovirus NOT DETECTED NOT DETECTED   Coronavirus 229E NOT DETECTED NOT DETECTED   Coronavirus HKU1 NOT DETECTED NOT DETECTED   Coronavirus NL63 NOT DETECTED NOT DETECTED   Coronavirus OC43 NOT DETECTED NOT DETECTED   Metapneumovirus NOT DETECTED NOT DETECTED   Rhinovirus / Enterovirus NOT DETECTED NOT DETECTED   Influenza A NOT DETECTED NOT DETECTED   Influenza A H1 NOT DETECTED NOT DETECTED   Influenza A H1 2009 NOT DETECTED NOT DETECTED   Influenza A H3 NOT DETECTED NOT DETECTED   Influenza B NOT DETECTED NOT DETECTED   Parainfluenza Virus 1 NOT DETECTED NOT DETECTED   Parainfluenza Virus 2 NOT DETECTED NOT DETECTED   Parainfluenza Virus 3 NOT DETECTED NOT DETECTED   Parainfluenza Virus 4 NOT DETECTED NOT DETECTED   Respiratory Syncytial Virus NOT DETECTED NOT DETECTED   Bordetella pertussis NOT DETECTED NOT DETECTED   Chlamydophila pneumoniae NOT DETECTED NOT DETECTED   Mycoplasma  pneumoniae NOT DETECTED NOT DETECTED  CBC     Status: None   Collection Time: 06/10/15  2:34 AM  Result Value Ref Range   WBC 6.1 4.0 - 10.5 K/uL   RBC 4.58 4.22 - 5.81 MIL/uL   Hemoglobin 15.0 13.0 - 17.0 g/dL   HCT 45.2 39.0 - 52.0 %   MCV 98.7 78.0 - 100.0 fL   MCH 32.8 26.0 - 34.0 pg   MCHC 33.2 30.0 - 36.0 g/dL   RDW 14.5 11.5 - 15.5 %   Platelets 172 150 - 400 K/uL  Troponin I     Status: Abnormal   Collection Time: 06/10/15  2:34 AM  Result Value Ref Range   Troponin I 0.05 (H) <0.031 ng/mL    Comment:        PERSISTENTLY INCREASED TROPONIN VALUES IN THE RANGE OF 0.04-0.49 ng/mL CAN BE SEEN IN:       -UNSTABLE ANGINA       -CONGESTIVE HEART FAILURE       -MYOCARDITIS       -CHEST TRAUMA       -ARRYHTHMIAS       -LATE PRESENTING MYOCARDIAL INFARCTION       -COPD   CLINICAL FOLLOW-UP RECOMMENDED.   Protime-INR     Status: Abnormal   Collection Time:  06/10/15  2:34 AM  Result Value Ref Range   Prothrombin Time 20.2 (H) 11.6 - 15.2 seconds   INR 1.73 (H) 0.00 - 1.49  APTT     Status: None   Collection Time: 06/10/15  2:34 AM  Result Value Ref Range   aPTT 37 24 - 37 seconds    Comment:        IF BASELINE aPTT IS ELEVATED, SUGGEST PATIENT RISK ASSESSMENT BE USED TO DETERMINE APPROPRIATE ANTICOAGULANT THERAPY.   Lipid panel     Status: None   Collection Time: 06/10/15  2:34 AM  Result Value Ref Range   Cholesterol 134 0 - 200 mg/dL   Triglycerides 85 <150 mg/dL   HDL 49 >40 mg/dL   Total CHOL/HDL Ratio 2.7 RATIO   VLDL 17 0 - 40 mg/dL   LDL Cholesterol 68 0 - 99 mg/dL    Comment:        Total Cholesterol/HDL:CHD Risk Coronary Heart Disease Risk Table                     Men   Women  1/2 Average Risk   3.4   3.3  Average Risk       5.0   4.4  2 X Average Risk   9.6   7.1  3 X Average Risk  23.4   11.0        Use the calculated Patient Ratio above and the CHD Risk Table to determine the patient's CHD Risk.        ATP III CLASSIFICATION (LDL):  <100      mg/dL   Optimal  100-129  mg/dL   Near or Above                    Optimal  130-159  mg/dL   Borderline  160-189  mg/dL   High  >190     mg/dL   Very High   Hemoglobin A1c     Status: None   Collection Time: 06/10/15  2:34 AM  Result Value Ref Range   Hgb A1c MFr Bld 5.6 4.8 - 5.6 %    Comment: (NOTE)         Pre-diabetes: 5.7 - 6.4         Diabetes: >6.4         Glycemic control for adults with diabetes: <7.0    Mean Plasma Glucose 114 mg/dL    Comment: (NOTE) Performed At: [facility] [place], [place] [id number] Lindon Romp MD [id number]   MRSA PCR Screening     Status: None   Collection Time: 06/10/15  2:58 AM  Result Value Ref Range   MRSA by PCR NEGATIVE NEGATIVE    Comment:        The GeneXpert MRSA Assay (FDA approved for NASAL specimens only), is one component of a comprehensive MRSA colonization surveillance program. It is not intended to diagnose MRSA infection nor to guide or monitor treatment for MRSA infections.   Creatinine, urine, random     Status: None   Collection Time: 06/10/15  5:22 AM  Result Value Ref Range   Creatinine, Urine 13.52 mg/dL  CBC     Status: Abnormal   Collection Time: 06/10/15  8:36 AM  Result Value Ref Range   WBC 4.5 4.0 - 10.5 K/uL   RBC 4.89 4.22 - 5.81 MIL/uL   Hemoglobin 16.1 13.0 - 17.0 g/dL   HCT 47.4  39.0 - 52.0 %   MCV 96.9 78.0 - 100.0 fL   MCH 32.9 26.0 - 34.0 pg   MCHC 34.0 30.0 - 36.0 g/dL   RDW 14.3 11.5 - 15.5 %   Platelets 149 (L) 150 - 400 K/uL  Troponin I     Status: Abnormal   Collection Time: 06/10/15  8:36 AM  Result Value Ref Range   Troponin I 0.04 (H) <0.031 ng/mL    Comment:        PERSISTENTLY INCREASED TROPONIN VALUES IN THE RANGE OF 0.04-0.49 ng/mL CAN BE SEEN IN:       -UNSTABLE ANGINA       -CONGESTIVE HEART FAILURE       -MYOCARDITIS       -CHEST TRAUMA       -ARRYHTHMIAS       -LATE PRESENTING MYOCARDIAL INFARCTION       -COPD   CLINICAL FOLLOW-UP  RECOMMENDED.   Basic metabolic panel     Status: Abnormal   Collection Time: 06/10/15  8:36 AM  Result Value Ref Range   Sodium 140 135 - 145 mmol/L   Potassium 4.6 3.5 - 5.1 mmol/L   Chloride 106 101 - 111 mmol/L   CO2 18 (L) 22 - 32 mmol/L   Glucose, Bld 127 (H) 65 - 99 mg/dL   BUN 23 (H) 6 - 20 mg/dL   Creatinine, Ser 1.28 (H) 0.61 - 1.24 mg/dL   Calcium 9.2 8.9 - 10.3 mg/dL   GFR calc non Af Amer >60 >60 mL/min   GFR calc Af Amer >60 >60 mL/min    Comment: (NOTE) The eGFR has been calculated using the CKD EPI equation. This calculation has not been validated in all clinical situations. eGFR's persistently <60 mL/min signify possible Chronic Kidney Disease.    Anion gap 16 (H) 5 - 15  Magnesium     Status: Abnormal   Collection Time: 06/10/15  8:36 AM  Result Value Ref Range   Magnesium 1.5 (L) 1.7 - 2.4 mg/dL  CBC     Status: Abnormal   Collection Time: 06/10/15  1:37 PM  Result Value Ref Range   WBC 5.9 4.0 - 10.5 K/uL   RBC 4.90 4.22 - 5.81 MIL/uL   Hemoglobin 16.8 13.0 - 17.0 g/dL   HCT 48.9 39.0 - 52.0 %   MCV 99.8 78.0 - 100.0 fL   MCH 34.3 (H) 26.0 - 34.0 pg   MCHC 34.4 30.0 - 36.0 g/dL   RDW 14.4 11.5 - 15.5 %   Platelets 164 150 - 400 K/uL  Troponin I     Status: None   Collection Time: 06/10/15  1:37 PM  Result Value Ref Range   Troponin I 0.03 <0.031 ng/mL    Comment:        NO INDICATION OF MYOCARDIAL INJURY.   CBC with Differential/Platelet     Status: Abnormal   Collection Time: 06/11/15  2:28 AM  Result Value Ref Range   WBC 11.1 (H) 4.0 - 10.5 K/uL   RBC 4.77 4.22 - 5.81 MIL/uL   Hemoglobin 15.6 13.0 - 17.0 g/dL   HCT 46.8 39.0 - 52.0 %   MCV 98.1 78.0 - 100.0 fL   MCH 32.7 26.0 - 34.0 pg   MCHC 33.3 30.0 - 36.0 g/dL   RDW 14.1 11.5 - 15.5 %   Platelets 166 150 - 400 K/uL   Neutrophils Relative % 87 %   Neutro Abs 9.7 (H) 1.7 -  7.7 K/uL   Lymphocytes Relative 4 %   Lymphs Abs 0.5 (L) 0.7 - 4.0 K/uL   Monocytes Relative 9 %   Monocytes  Absolute 1.0 0.1 - 1.0 K/uL   Eosinophils Relative 0 %   Eosinophils Absolute 0.0 0.0 - 0.7 K/uL   Basophils Relative 0 %   Basophils Absolute 0.0 0.0 - 0.1 K/uL  Comprehensive metabolic panel     Status: Abnormal   Collection Time: 06/11/15  2:28 AM  Result Value Ref Range   Sodium 139 135 - 145 mmol/L   Potassium 4.4 3.5 - 5.1 mmol/L    Comment: SPECIMEN HEMOLYZED. HEMOLYSIS MAY AFFECT INTEGRITY OF RESULTS.   Chloride 100 (L) 101 - 111 mmol/L   CO2 27 22 - 32 mmol/L   Glucose, Bld 159 (H) 65 - 99 mg/dL   BUN 27 (H) 6 - 20 mg/dL   Creatinine, Ser 1.40 (H) 0.61 - 1.24 mg/dL   Calcium 9.3 8.9 - 10.3 mg/dL   Total Protein 5.7 (L) 6.5 - 8.1 g/dL   Albumin 3.1 (L) 3.5 - 5.0 g/dL   AST 48 (H) 15 - 41 U/L   ALT 73 (H) 17 - 63 U/L   Alkaline Phosphatase 81 38 - 126 U/L   Total Bilirubin 1.2 0.3 - 1.2 mg/dL   GFR calc non Af Amer 57 (L) >60 mL/min   GFR calc Af Amer >60 >60 mL/min    Comment: (NOTE) The eGFR has been calculated using the CKD EPI equation. This calculation has not been validated in all clinical situations. eGFR's persistently <60 mL/min signify possible Chronic Kidney Disease.    Anion gap 12 5 - 15    Ct Abdomen Pelvis Wo Contrast  06/10/2015  CLINICAL DATA:  Rectal bleeding.  Abdominal pain. EXAM: CT ABDOMEN AND PELVIS WITHOUT CONTRAST TECHNIQUE: Multidetector CT imaging of the abdomen and pelvis was performed following the standard protocol without IV contrast. COMPARISON:  05/03/2010 FINDINGS: There is a 2 mm lower pole right renal collecting system calculus. There are no ureteral calculi. There are unremarkable unenhanced appearances of the liver, spleen, pancreas, adrenals and kidneys. There is mild gallbladder mural thickening. There is hazy opacity throughout the mesentery, suggesting mesenteric edema. No frank ascites. Bowel is unremarkable. The appendix is normal. There is no significant skeletal lesion. There is a moderate right pleural effusion. There is  interlobular septal thickening consistent with interstitial fluid in the bases. IMPRESSION: 1. Right nephrolithiasis. 2. Gallbladder mural thickening, nonspecific. 3. Mild diffuse opacity throughout the mesentery, suggesting mesenteric edema. No bowel obstruction or perforation. No focal inflammatory change. 4. Moderate right pleural effusion. Probable interstitial fluid in the lung bases. Electronically Signed   By: Andreas Newport M.D.   On: 06/10/2015 06:21   Dg Chest 2 View  06/09/2015  CLINICAL DATA:  Shortness of breath, chest tightness and syncope. EXAM: CHEST - 2 VIEW COMPARISON:  06/04/2015 FINDINGS: Stable moderate cardiac enlargement. Diffuse interstitial edema present without overt airspace edema or associated pleural effusions. No pneumothorax or airspace consolidation. Bony structures are unremarkable. IMPRESSION: Cardiomegaly with diffuse interstitial edema. Electronically Signed   By: Aletta Edouard M.D.   On: 06/09/2015 21:03    Review of Systems  Constitutional: Negative for fever and chills.  Eyes: Negative for photophobia.  Respiratory: Positive for shortness of breath. Negative for cough.   Cardiovascular: Positive for palpitations. Negative for chest pain and leg swelling.  Gastrointestinal: Positive for blood in stool. Negative for nausea and vomiting.  Genitourinary: Negative  for dysuria.  Neurological: Negative for dizziness and headaches.   Blood pressure 112/92, pulse 93, temperature 97.8 F (36.6 C), temperature source Oral, resp. rate 17, height 5' 6"  (1.676 m), weight 81.421 kg (179 lb 8 oz), SpO2 100 %. Physical Exam  Constitutional: He is oriented to person, place, and time.  HENT:  Head: Normocephalic and atraumatic.  Eyes: Conjunctivae are normal. Pupils are equal, round, and reactive to light. No scleral icterus.  Neck: Normal range of motion. Neck supple. No JVD present. No tracheal deviation present. No thyromegaly present.  Cardiovascular:  Irregularly  irregular, tachycardic, S1, S2, soft.  There is soft systolic murmur.  No S3 gallop  Respiratory: Effort normal and breath sounds normal. No respiratory distress. He has no wheezes. He has no rales.  GI: Soft. Bowel sounds are normal. He exhibits no distension. There is no tenderness. There is no rebound.  Musculoskeletal: He exhibits no edema or tenderness.  Neurological: He is alert and oriented to person, place, and time.    Assessment/Plan: Chronic A. Fib with RVR,  Secondary to noncompliance to medication and also exacerbated by beta agonist./Chadsvas score of 4 with left atrial appendage thrombus Compensated systolic congestive heart failure. Nonischemic dilated cardiomyopathy. Hypertension. History of TIA in the past. EtOH abuse. Tobacco abuse. Anxiety disorder. Heme positive stool Plan DC beta agonist. Digoxin 0.25 mg IV 1 dose today Increase Lopressor to 50 mg twice daily. Patient will need only rate control and chronic anticoagulation in view of left atrial appendage thrombus, may need TEE in 4 weeks prior to consideration for cardioversion. Will avoid rhythm control medication.  In view of left atrium appendage thrombus. Consider GI evaluation for heme positive stool. DC aspirin  Charolette Forward 06/11/2015, 9:47 AM

## 2015-06-12 MED ORDER — SACUBITRIL-VALSARTAN 24-26 MG PO TABS
1.0000 | ORAL_TABLET | Freq: Two times a day (BID) | ORAL | Status: DC
  Administered 2015-06-12 – 2015-06-13 (×2): 1 via ORAL
  Filled 2015-06-12 (×4): qty 1

## 2015-06-12 MED ORDER — METOPROLOL TARTRATE 50 MG PO TABS
75.0000 mg | ORAL_TABLET | Freq: Two times a day (BID) | ORAL | Status: DC
  Administered 2015-06-12 – 2015-06-13 (×2): 75 mg via ORAL
  Filled 2015-06-12 (×2): qty 1

## 2015-06-12 MED ORDER — FUROSEMIDE 40 MG PO TABS
40.0000 mg | ORAL_TABLET | Freq: Every day | ORAL | Status: DC
  Administered 2015-06-12: 40 mg via ORAL
  Filled 2015-06-12: qty 1

## 2015-06-12 NOTE — Progress Notes (Signed)
Physical Therapy Treatment Patient Details Name: [name] MRN: [id number] DOB: 13-Aug-1965 Today's Date: 06/12/2015    History of Present Illness This 50 year old man was admitted with rectal bleeding, abdominal pain, and SOB.  PMH significant for COPD, CHF with EF 15%, depression, anxiety, ETOH, CAD, MI, TIA, CVA, and CKD    PT Comments    Session limited as pt became symptomatically hypotensive sitting EOB (88/46). Pt assisted back to supine and RN notified.  Pt will benefit from continued skilled PT services to increase functional independence and safety.   Follow Up Recommendations  Home health PT;Supervision - Intermittent     Equipment Recommendations  Rolling walker with 5" wheels    Recommendations for Other Services       Precautions / Restrictions Precautions Precautions: Fall Precaution Comments: orthostasis Restrictions Weight Bearing Restrictions: No    Mobility  Bed Mobility Overal bed mobility: Needs Assistance Bed Mobility: Supine to Sit;Sit to Supine     Supine to sit: Min guard;HOB elevated Sit to supine: Min guard   General bed mobility comments: Close min guard for safety as pt swaying once sitting up w/ +dizziness  Transfers                 General transfer comment: did not attempt due to pt's symptomatic orthostasis  Ambulation/Gait                 Stairs            Wheelchair Mobility    Modified Rankin (Stroke Patients Only)       Balance Overall balance assessment: Needs assistance Sitting-balance support: Bilateral upper extremity supported;Feet supported Sitting balance-Leahy Scale: Poor Sitting balance - Comments: Pt swaying while sitting EOB and requires very close min guard assist as pt reaching out for bed rail for support                            Cognition Arousal/Alertness: Awake/alert Behavior During Therapy: Impulsive Overall Cognitive Status: No family/caregiver present to  determine baseline cognitive functioning Area of Impairment: Safety/judgement;Orientation;Following commands Orientation Level: Disoriented to;Place     Following Commands: Follows one step commands inconsistently Safety/Judgement: Decreased awareness of safety     General Comments: Often had to repeat things for pt to follow commands and pt unable to report which hospital he was in.      Exercises      General Comments General comments (skin integrity, edema, etc.): BP sitting 88/46, supine at end of session 109/93      Pertinent Vitals/Pain Pain Assessment: Faces Faces Pain Scale: Hurts little more Pain Location: abdomen Pain Descriptors / Indicators: Aching Pain Intervention(s): Limited activity within patient's tolerance;Monitored during session    Home Living                      Prior Function            PT Goals (current goals can now be found in the care plan section) Acute Rehab PT Goals Patient Stated Goal: to feel better PT Goal Formulation: With patient Time For Goal Achievement: 06/15/15 Potential to Achieve Goals: Good Progress towards PT goals: Not progressing toward goals - comment (due to hypotension)    Frequency  Min 3X/week    PT Plan Current plan remains appropriate    Co-evaluation             End of Session  Activity Tolerance: Treatment limited secondary to medical complications (Comment) (orthostasis) Patient left: in bed;with call bell/phone within reach;with bed alarm set     Time: 1100-1116 PT Time Calculation (min) (ACUTE ONLY): 16 min  Charges:  $Therapeutic Activity: 8-22 mins                    G Codes:      Encarnacion Chu PT, DPT  Pager: [phone] Phone: [phone] 06/12/2015, 3:27 PM

## 2015-06-12 NOTE — Progress Notes (Signed)
OT Cancellation Note  Patient Details Name: [name] MRN: [id number] DOB: 01-02-1966   Cancelled Treatment:    Reason Eval/Treat Not Completed: Medical issues which prohibited therapy.  Pt orthostatic with PT (and symptomatic). Will check back another day.  Nellene Courtois 06/12/2015, 11:18 AM  Marica Otter, OTR/L [phone] 06/12/2015

## 2015-06-12 NOTE — Progress Notes (Signed)
06/12/2015 1315 Received to room 2W38 a pt from 2H.  Pt is A&O, no c/o voiced.  Tele applied and CCMD notified.  Oriented pt to room, call light and bed.  Call bell in reach. Kathryne Hitch
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
## 2015-06-12 NOTE — Care Management Note (Signed)
Case Management Note Donn Pierini RN, BSN Unit 2W-Case Manager [phone]  Patient Details  Name: [name] MRN: [id number] Date of Birth: 1965-09-02  Subjective/Objective:  Pt admitted with COPD               Action/Plan: PTA pt lived at home- alone referral received for COPD GOLD Protocol- however pt does not meet guidelines for COPD GOLD- has only had 2 admits in last 6 mo. CM will follow for potential d/c needs.   Expected Discharge Date:                  Expected Discharge Plan:  Home/Self Care  In-House Referral:     Discharge planning Services  CM Consult  Post Acute Care Choice:    Choice offered to:     DME Arranged:    DME Agency:     HH Arranged:    HH Agency:     Status of Service:  In process, will continue to follow  Medicare Important Message Given:    Date Medicare IM Given:    Medicare IM give by:    Date Additional Medicare IM Given:    Additional Medicare Important Message give by:     If discussed at Long Length of Stay Meetings, dates discussed:    Additional Comments:  Darrold Span, RN 06/12/2015, 1:42 PM

## 2015-06-12 NOTE — Progress Notes (Signed)
Subjective:  Denies any chest pain or shortness of breath.  Occasional episodes of A. Fib with RVR  Objective:  Vital Signs in the last 24 hours: Temp:  [97.4 F (36.3 C)-98.1 F (36.7 C)] 97.6 F (36.4 C) (05/12 1140) Pulse Rate:  [30-107] 80 (05/12 1200) Resp:  [19-31] 27 (05/12 1200) BP: (99-147)/(81-130) 118/94 mmHg (05/12 1200) SpO2:  [88 %-99 %] 92 % (05/12 1200) Weight:  [79.017 kg (174 lb 3.2 oz)] 79.017 kg (174 lb 3.2 oz) (05/12 0523)  Intake/Output from previous day: 05/11 0701 - 05/12 0700 In: 960 [P.O.:960] Out: 4800 [Urine:4800] Intake/Output from this shift: Total I/O In: 360 [P.O.:360] Out: 850 [Urine:850]  Physical Exam: Neck: no adenopathy, no carotid bruit, no JVD and supple, symmetrical, trachea midline Lungs: clear to auscultation bilaterally Heart: irregularly irregular rhythm, S1, S2 normal and social systolic murmur noted Abdomen: soft, non-tender; bowel sounds normal; no masses,  no organomegaly Extremities: extremities normal, atraumatic, no cyanosis or edema  Lab Results:  Recent Labs  06/10/15 1337 06/11/15 0228  WBC 5.9 11.1*  HGB 16.8 15.6  PLT 164 166    Recent Labs  06/10/15 0836 06/11/15 0228  NA 140 139  K 4.6 4.4  CL 106 100*  CO2 18* 27  GLUCOSE 127* 159*  BUN 23* 27*  CREATININE 1.28* 1.40*    Recent Labs  06/10/15 0836 06/10/15 1337  TROPONINI 0.04* 0.03   Hepatic Function Panel  Recent Labs  06/11/15 0228  PROT 5.7*  ALBUMIN 3.1*  AST 48*  ALT 73*  ALKPHOS 81  BILITOT 1.2    Recent Labs  06/10/15 0234  CHOL 134   No results for input(s): PROTIME in the last 72 hours.  Imaging: Imaging results have been reviewed and No results found.  Cardiac Studies:  Assessment/Plan:  Chronic A. Fib with RVR, Secondary to noncompliance to medication and also exacerbated by beta agonist./Chadsvas score of 4 with left atrial appendage thrombus Compensated systolic congestive heart failure. Nonischemic  dilated cardiomyopathy. Hypertension. History of TIA in the past. EtOH abuse. Tobacco abuse. Anxiety disorder. Heme positive stool Plan Increase Lopressor to 75 mg twice daily. Start low-dose Entresto as per orders. Change IV Lasix to by mouth Okay toDischarge from cardiac point of view  LOS: 2 days    Rinaldo Cloud 06/12/2015, 12:58 PM

## 2015-06-13 DIAGNOSIS — I4891 Unspecified atrial fibrillation: Secondary | ICD-10-CM

## 2015-06-13 LAB — CBC
HCT: 57.5 % — ABNORMAL HIGH (ref 39.0–52.0)
Hemoglobin: 19.5 g/dL — ABNORMAL HIGH (ref 13.0–17.0)
MCH: 34.1 pg — ABNORMAL HIGH (ref 26.0–34.0)
MCHC: 33.9 g/dL (ref 30.0–36.0)
MCV: 100.5 fL — AB (ref 78.0–100.0)
PLATELETS: 193 10*3/uL (ref 150–400)
RBC: 5.72 MIL/uL (ref 4.22–5.81)
RDW: 14.2 % (ref 11.5–15.5)
WBC: 9.7 10*3/uL (ref 4.0–10.5)

## 2015-06-13 LAB — BASIC METABOLIC PANEL
Anion gap: 13 (ref 5–15)
BUN: 24 mg/dL — ABNORMAL HIGH (ref 6–20)
CHLORIDE: 97 mmol/L — AB (ref 101–111)
CO2: 29 mmol/L (ref 22–32)
CREATININE: 1.23 mg/dL (ref 0.61–1.24)
Calcium: 9.4 mg/dL (ref 8.9–10.3)
GFR calc Af Amer: >60 mL/min (ref >60)
GFR calc non Af Amer: >60 mL/min (ref >60)
Glucose, Bld: 95 mg/dL (ref 65–99)
POTASSIUM: 4 mmol/L (ref 3.5–5.1)
SODIUM: 139 mmol/L (ref 135–145)

## 2015-06-13 MED ORDER — DIGOXIN 250 MCG PO TABS: 0.2500 mg | ORAL_TABLET | Freq: Every day | ORAL | Status: AC

## 2015-06-13 MED ORDER — OXYCODONE-ACETAMINOPHEN 5-325 MG PO TABS: 2.0000 | ORAL_TABLET | Freq: Four times a day (QID) | ORAL | Status: AC | PRN

## 2015-06-13 MED ORDER — PREDNISONE 20 MG PO TABS: 20.0000 mg | ORAL_TABLET | Freq: Every day | ORAL | Status: DC

## 2015-06-13 MED ORDER — FUROSEMIDE 20 MG PO TABS: 40.0000 mg | ORAL_TABLET | Freq: Every day | ORAL | Status: AC

## 2015-06-13 MED ORDER — AZITHROMYCIN 250 MG PO TABS: 250.0000 mg | ORAL_TABLET | Freq: Every day | ORAL | Status: DC

## 2015-06-13 MED ORDER — SACUBITRIL-VALSARTAN 24-26 MG PO TABS: 1.0000 | ORAL_TABLET | Freq: Two times a day (BID) | ORAL | Status: AC

## 2015-06-13 MED ORDER — METOPROLOL TARTRATE 50 MG PO TABS: 75.0000 mg | ORAL_TABLET | Freq: Two times a day (BID) | ORAL | Status: AC

## 2015-06-13 NOTE — Discharge Summary (Signed)
Physician Discharge Summary  [name] YKD:[id number] DOB: [birth year] DOA: 06/09/2015  PCP: Jacklynn Barnacle, NP  Admit date: 06/09/2015 Discharge date: 06/13/2015  Time spent: > 30 minutes  Recommendations for Outpatient Follow-up:  1. Follow up with Dr. Sharyn Lull in 1-2 weeks   Discharge Diagnoses:  Principal Problem:   Atrial fibrillation with RVR (HCC) Active Problems:   Alcoholism /alcohol abuse (HCC)   Anxiety and depression   Hypertension   COPD exacerbation (HCC)   Acute on chronic systolic (congestive) heart failure (HCC)   TIA (transient ischemic attack)   Abdominal pain   Acute on chronic kidney failure-II   Rectal bleeding  Discharge Condition: stable  Diet recommendation: heart healthy, low salt  Filed Weights   06/11/15 0500 06/12/15 0523 06/13/15 0535  Weight: 81.421 kg (179 lb 8 oz) 79.017 kg (174 lb 3.2 oz) 79.516 kg (175 lb 4.8 oz)    History of present illness:  See H&P, Labs, Consult and Test reports for all details in brief, patient is a 50 y.o. male with medical history significant of formal smoker, COPD, systolic congestive heart failure with EF of 15%, depression, anxiety, alcohol abuse, atrial fibrillation on Eliquis, CAD, myocardial infarction, TIA, stroke, chronic kidney disease-stage II, medication noncompliance, left atrial thrombsis (per Dr. Annitta Jersey note, pt had left atrial thrombosis by TEE on 07/2014), who presents with rectal bleeding, abdominal pain, shortness breath, productive cough.   Hospital Course:  A fib with RVR - Patient was admitted to the hospital with A. fib with RVR, cardiology (Dr. Sharyn Lull) was consulted and have followed patient was hospitalized. CHad2Vasc2 score of 4 with left atrial appendage thrombus, on Eliquis. Discontinue aspirin. Acute on chronic combined systolic and diastolic heart failure in the setting of nonischemic cardiomyopathy - patient was diuresed initially with IV Lasix, and he'll converted to by mouth on 5/12.  He is net -7.2 L and clinically is now euvolemic. His weight is down to 175 pounds from 182 on admission. Acute hypoxic respiratory failure - likely in the setting of acute on chronic heart failure, improved with diuresis, on room air today. Probable hemorrhoidal bleed - No further dark stool, hemoglobin has remained stable, outpatient evaluation Alcoholism - last drink? 3 weeks ago, initially triggering CIWA, has completed detox, no longer triggering COPD exacerbation - concern of admission due to wheezing, he was placed on prednisone and azithromycin, improved, we'll need 2 additional days following discharge to complete a 5 day course. Chronic kidney disease stage 2-3 - his creatinine has remained stable with diuresis, his back to baseline discharge with a creatinine of 1.23  Bipolar affective disorder - outpatient management   Procedures:  None    Consultations:  Cardiology   Discharge Exam: Filed Vitals:   06/13/15 0535 06/13/15 0642 06/13/15 0736 06/13/15 0925  BP: 109/92  111/69 115/70  Pulse: 76 87 75   Temp: 97.8 F (36.6 C)     TempSrc: Oral  Oral   Resp: 18     Height:      Weight: 79.516 kg (175 lb 4.8 oz)     SpO2: 97%  99%     General: NAD Cardiovascular: RRR Respiratory: CTA biL  Discharge Instructions Activity:  As tolerated   Get Medicines reviewed and adjusted: Please take all your medications with you for your next visit with your Primary MD  Please request your Primary MD to go over all hospital tests and procedure/radiological results at the follow up, please ask your Primary MD to get all  Hospital records sent to his/her office.  If you experience worsening of your admission symptoms, develop shortness of breath, life threatening emergency, suicidal or homicidal thoughts you must seek medical attention immediately by calling 911 or calling your MD immediately if symptoms less severe.  You must read complete instructions/literature along with all  the possible adverse reactions/side effects for all the Medicines you take and that have been prescribed to you. Take any new Medicines after you have completely understood and accpet all the possible adverse reactions/side effects.   Do not drive when taking Pain medications.   Do not take more than prescribed Pain, Sleep and Anxiety Medications  Special Instructions: If you have smoked or chewed Tobacco in the last 2 yrs please stop smoking, stop any regular Alcohol and or any Recreational drug use.  Wear Seat belts while driving.  Please note  You were cared for by a hospitalist during your hospital stay. Once you are discharged, your primary care physician will handle any further medical issues. Please note that NO REFILLS for any discharge medications will be authorized once you are discharged, as it is imperative that you return to your primary care physician (or establish a relationship with a primary care physician if you do not have one) for your aftercare needs so that they can reassess your need for medications and monitor your lab values.    Medication List    STOP taking these medications        aspirin 325 MG tablet     lisinopril 2.5 MG tablet  Commonly known as:  PRINIVIL,ZESTRIL      TAKE these medications        albuterol 108 (90 Base) MCG/ACT inhaler  Commonly known as:  PROVENTIL HFA;VENTOLIN HFA  Inhale 2 puffs into the lungs every 6 (six) hours as needed for wheezing or shortness of breath.     apixaban 5 MG Tabs tablet  Commonly known as:  ELIQUIS  Take 1 tablet (5 mg total) by mouth 2 (two) times daily.     atorvastatin 40 MG tablet  Commonly known as:  LIPITOR  Take 1 tablet (40 mg total) by mouth daily at 6 PM.     azithromycin 250 MG tablet  Commonly known as:  ZITHROMAX  Take 1 tablet (250 mg total) by mouth daily. For 2 additional days     digoxin 0.25 MG tablet  Commonly known as:  LANOXIN  Take 1 tablet (0.25 mg total) by mouth daily.       furosemide 20 MG tablet  Commonly known as:  LASIX  Take 2 tablets (40 mg total) by mouth daily.     ibuprofen 200 MG tablet  Commonly known as:  ADVIL,MOTRIN  Take 200 mg by mouth every 6 (six) hours as needed for moderate pain.     metoprolol 50 MG tablet  Commonly known as:  LOPRESSOR  Take 1.5 tablets (75 mg total) by mouth 2 (two) times daily.     oxyCODONE-acetaminophen 5-325 MG tablet  Commonly known as:  PERCOCET/ROXICET  Take 2 tablets by mouth every 6 (six) hours as needed for severe pain.     potassium chloride SA 20 MEQ tablet  Commonly known as:  K-DUR,KLOR-CON  Take 1 tablet (20 mEq total) by mouth daily.     predniSONE 20 MG tablet  Commonly known as:  DELTASONE  Take 1 tablet (20 mg total) by mouth daily with breakfast. For 2 additional days     sacubitril-valsartan 24-26  MG  Commonly known as:  ENTRESTO  Take 1 tablet by mouth 2 (two) times daily.           Follow-up Information    Follow up with Rinaldo Cloud, MD. Schedule an appointment as soon as possible for a visit in 1 week.   Specialty:  Cardiology   Contact information:   [address] [place] [address] [phone]       The results of significant diagnostics from this hospitalization (including imaging, microbiology, ancillary and laboratory) are listed below for reference.    Significant Diagnostic Studies: Ct Abdomen Pelvis Wo Contrast  06/10/2015  CLINICAL DATA:  Rectal bleeding.  Abdominal pain. EXAM: CT ABDOMEN AND PELVIS WITHOUT CONTRAST TECHNIQUE: Multidetector CT imaging of the abdomen and pelvis was performed following the standard protocol without IV contrast. COMPARISON:  05/03/2010 FINDINGS: There is a 2 mm lower pole right renal collecting system calculus. There are no ureteral calculi. There are unremarkable unenhanced appearances of the liver, spleen, pancreas, adrenals and kidneys. There is mild gallbladder mural thickening. There is hazy opacity  throughout the mesentery, suggesting mesenteric edema. No frank ascites. Bowel is unremarkable. The appendix is normal. There is no significant skeletal lesion. There is a moderate right pleural effusion. There is interlobular septal thickening consistent with interstitial fluid in the bases. IMPRESSION: 1. Right nephrolithiasis. 2. Gallbladder mural thickening, nonspecific. 3. Mild diffuse opacity throughout the mesentery, suggesting mesenteric edema. No bowel obstruction or perforation. No focal inflammatory change. 4. Moderate right pleural effusion. Probable interstitial fluid in the lung bases. Electronically Signed   By: Ellery Plunk M.D.   On: 06/10/2015 06:21   Dg Chest 2 View  06/09/2015  CLINICAL DATA:  Shortness of breath, chest tightness and syncope. EXAM: CHEST - 2 VIEW COMPARISON:  06/04/2015 FINDINGS: Stable moderate cardiac enlargement. Diffuse interstitial edema present without overt airspace edema or associated pleural effusions. No pneumothorax or airspace consolidation. Bony structures are unremarkable. IMPRESSION: Cardiomegaly with diffuse interstitial edema. Electronically Signed   By: Irish Lack M.D.   On: 06/09/2015 21:03   Dg Chest Portable 1 View  06/04/2015  CLINICAL DATA:  Atrial fibrillation EXAM: PORTABLE CHEST 1 VIEW COMPARISON:  05/14/2015 FINDINGS: Mild bilateral interstitial thickening. There is no focal parenchymal opacity. There is no pleural effusion or pneumothorax. There is stable cardiomegaly. The osseous structures are unremarkable. IMPRESSION: Cardiomegaly with mild pulmonary vascular congestion. Electronically Signed   By: Elige Ko   On: 06/04/2015 09:05    Microbiology: Recent Results (from the past 240 hour(s))  Respiratory Panel by PCR     Status: None   Collection Time: 06/10/15  2:30 AM  Result Value Ref Range Status   Adenovirus NOT DETECTED NOT DETECTED Final   Coronavirus 229E NOT DETECTED NOT DETECTED Final   Coronavirus HKU1 NOT  DETECTED NOT DETECTED Final   Coronavirus NL63 NOT DETECTED NOT DETECTED Final   Coronavirus OC43 NOT DETECTED NOT DETECTED Final   Metapneumovirus NOT DETECTED NOT DETECTED Final   Rhinovirus / Enterovirus NOT DETECTED NOT DETECTED Final   Influenza A NOT DETECTED NOT DETECTED Final   Influenza A H1 NOT DETECTED NOT DETECTED Final   Influenza A H1 2009 NOT DETECTED NOT DETECTED Final   Influenza A H3 NOT DETECTED NOT DETECTED Final   Influenza B NOT DETECTED NOT DETECTED Final   Parainfluenza Virus 1 NOT DETECTED NOT DETECTED Final   Parainfluenza Virus 2 NOT DETECTED NOT DETECTED Final   Parainfluenza Virus 3  NOT DETECTED NOT DETECTED Final   Parainfluenza Virus 4 NOT DETECTED NOT DETECTED Final   Respiratory Syncytial Virus NOT DETECTED NOT DETECTED Final   Bordetella pertussis NOT DETECTED NOT DETECTED Final   Chlamydophila pneumoniae NOT DETECTED NOT DETECTED Final   Mycoplasma pneumoniae NOT DETECTED NOT DETECTED Final  MRSA PCR Screening     Status: None   Collection Time: 06/10/15  2:58 AM  Result Value Ref Range Status   MRSA by PCR NEGATIVE NEGATIVE Final    Comment:        The GeneXpert MRSA Assay (FDA approved for NASAL specimens only), is one component of a comprehensive MRSA colonization surveillance program. It is not intended to diagnose MRSA infection nor to guide or monitor treatment for MRSA infections.      Labs: Basic Metabolic Panel:  Recent Labs Lab 06/09/15 2023 06/10/15 0836 06/11/15 0228 06/13/15 0545  NA 142 140 139 139  K 5.1 4.6 4.4 4.0  CL 107 106 100* 97*  CO2 25 18* 27 29  GLUCOSE 98 127* 159* 95  BUN 31* 23* 27* 24*  CREATININE 1.75* 1.28* 1.40* 1.23  CALCIUM 9.4 9.2 9.3 9.4  MG  --  1.5*  --   --    Liver Function Tests:  Recent Labs Lab 06/09/15 2023 06/11/15 0228  AST 91* 48*  ALT 106* 73*  ALKPHOS 85 81  BILITOT 1.1 1.2  PROT 6.0* 5.7*  ALBUMIN 3.3* 3.1*    Recent Labs Lab 06/09/15 2023  LIPASE 27   No  results for input(s): AMMONIA in the last 168 hours. CBC:  Recent Labs Lab 06/10/15 0234 06/10/15 0836 06/10/15 1337 06/11/15 0228 06/13/15 0545  WBC 6.1 4.5 5.9 11.1* 9.7  NEUTROABS  --   --   --  9.7*  --   HGB 15.0 16.1 16.8 15.6 19.5*  HCT 45.2 47.4 48.9 46.8 57.5*  MCV 98.7 96.9 99.8 98.1 100.5*  PLT 172 149* 164 166 193   Cardiac Enzymes:  Recent Labs Lab 06/10/15 0234 06/10/15 0836 06/10/15 1337  TROPONINI 0.05* 0.04* 0.03   BNP: BNP (last 3 results)  Recent Labs  05/14/15 0843 05/14/15 1446 06/09/15 2005  BNP 1222.6* 1076.6* 1590.7*    Signed:  Pamella Pert  [facility] 06/13/2015, 2:21 PM

## 2015-06-13 NOTE — Progress Notes (Signed)
Subjective:  Doing well denies any chest pain or shortness of breath. Remains in A. fib with controlled ventricular response.  Objective:  Vital Signs in the last 24 hours: Temp:  [97.4 F (36.3 C)-97.8 F (36.6 C)] 97.8 F (36.6 C) (05/13 0535) Pulse Rate:  [75-96] 75 (05/13 0736) Resp:  [16-30] 18 (05/13 0535) BP: (105-118)/(69-94) 115/70 mmHg (05/13 0925) SpO2:  [92 %-99 %] 99 % (05/13 0736) Weight:  [79.516 kg (175 lb 4.8 oz)] 79.516 kg (175 lb 4.8 oz) (05/13 0535)  Intake/Output from previous day: 05/12 0701 - 05/13 0700 In: 360 [P.O.:360] Out: 1130 [Urine:1130] Intake/Output from this shift:    Physical Exam: Neck: no adenopathy, no carotid bruit, no JVD, supple, symmetrical, trachea midline and thyroid not enlarged, symmetric, no tenderness/mass/nodules Lungs: clear to auscultation bilaterally Heart: irregularly irregular rhythm, S1, S2 normal and Soft systolic murmur noted no S3 gallop Abdomen: soft, non-tender; bowel sounds normal; no masses,  no organomegaly Extremities: extremities normal, atraumatic, no cyanosis or edema  Lab Results:  Recent Labs  06/11/15 0228 06/13/15 0545  WBC 11.1* 9.7  HGB 15.6 19.5*  PLT 166 193    Recent Labs  06/11/15 0228 06/13/15 0545  NA 139 139  K 4.4 4.0  CL 100* 97*  CO2 27 29  GLUCOSE 159* 95  BUN 27* 24*  CREATININE 1.40* 1.23    Recent Labs  06/10/15 1337  TROPONINI 0.03   Hepatic Function Panel  Recent Labs  06/11/15 0228  PROT 5.7*  ALBUMIN 3.1*  AST 48*  ALT 73*  ALKPHOS 81  BILITOT 1.2   No results for input(s): CHOL in the last 72 hours. No results for input(s): PROTIME in the last 72 hours.  Imaging: Imaging results have been reviewed and No results found.  Cardiac Studies:  Assessment/Plan:  Chronic A. Fib with controlled ventricular response.Marland KitchenCorwin Levins score of 4 with left atrial appendage thrombus Compensated systolic congestive heart failure. Nonischemic dilated  cardiomyopathy. Hypertension. History of TIA in the past. EtOH abuse. Tobacco abuse. Anxiety disorder. Heme positive stool Plan Continue present management Will up titrate Entresto as tolerated as outpatient. Okay to discharge from cardiac point of view I will sign off Follow-up with me in one to 2 weeks  LOS: 3 days    Rinaldo Cloud 06/13/2015, 10:14 AM

## 2015-06-13 NOTE — Progress Notes (Signed)
Patient to D/C home with friend. IV removed. Tele monitor removed. CCMD notified. Education done. Patient personal belongings given to patient. Patient received walker. D/C home.  lexie [employer] RN

## 2015-07-16 ENCOUNTER — Encounter (HOSPITAL_COMMUNITY): Payer: Self-pay | Admitting: Emergency Medicine

## 2015-07-16 ENCOUNTER — Inpatient Hospital Stay (HOSPITAL_COMMUNITY): Payer: Medicaid Other

## 2015-07-16 ENCOUNTER — Emergency Department (HOSPITAL_COMMUNITY): Payer: Medicaid Other

## 2015-07-16 ENCOUNTER — Inpatient Hospital Stay (HOSPITAL_COMMUNITY)
Admission: EM | Admit: 2015-07-16 | Discharge: 2015-08-01 | DRG: 291 | Disposition: E | Payer: Medicaid Other | Attending: Cardiovascular Disease | Admitting: Cardiovascular Disease

## 2015-07-16 DIAGNOSIS — J80 Acute respiratory distress syndrome: Secondary | ICD-10-CM | POA: Diagnosis present

## 2015-07-16 DIAGNOSIS — I252 Old myocardial infarction: Secondary | ICD-10-CM

## 2015-07-16 DIAGNOSIS — E873 Alkalosis: Secondary | ICD-10-CM | POA: Diagnosis not present

## 2015-07-16 DIAGNOSIS — R34 Anuria and oliguria: Secondary | ICD-10-CM | POA: Diagnosis not present

## 2015-07-16 DIAGNOSIS — I081 Rheumatic disorders of both mitral and tricuspid valves: Secondary | ICD-10-CM | POA: Diagnosis present

## 2015-07-16 DIAGNOSIS — J189 Pneumonia, unspecified organism: Secondary | ICD-10-CM | POA: Diagnosis present

## 2015-07-16 DIAGNOSIS — I959 Hypotension, unspecified: Secondary | ICD-10-CM | POA: Diagnosis present

## 2015-07-16 DIAGNOSIS — E875 Hyperkalemia: Secondary | ICD-10-CM | POA: Diagnosis present

## 2015-07-16 DIAGNOSIS — I480 Paroxysmal atrial fibrillation: Secondary | ICD-10-CM | POA: Diagnosis present

## 2015-07-16 DIAGNOSIS — J9601 Acute respiratory failure with hypoxia: Secondary | ICD-10-CM

## 2015-07-16 DIAGNOSIS — Z66 Do not resuscitate: Secondary | ICD-10-CM | POA: Diagnosis not present

## 2015-07-16 DIAGNOSIS — Z91030 Bee allergy status: Secondary | ICD-10-CM

## 2015-07-16 DIAGNOSIS — E874 Mixed disorder of acid-base balance: Secondary | ICD-10-CM | POA: Diagnosis not present

## 2015-07-16 DIAGNOSIS — I130 Hypertensive heart and chronic kidney disease with heart failure and stage 1 through stage 4 chronic kidney disease, or unspecified chronic kidney disease: Secondary | ICD-10-CM | POA: Diagnosis present

## 2015-07-16 DIAGNOSIS — G9341 Metabolic encephalopathy: Secondary | ICD-10-CM | POA: Diagnosis present

## 2015-07-16 DIAGNOSIS — I420 Dilated cardiomyopathy: Secondary | ICD-10-CM | POA: Diagnosis present

## 2015-07-16 DIAGNOSIS — Z7901 Long term (current) use of anticoagulants: Secondary | ICD-10-CM | POA: Diagnosis not present

## 2015-07-16 DIAGNOSIS — E876 Hypokalemia: Secondary | ICD-10-CM | POA: Diagnosis not present

## 2015-07-16 DIAGNOSIS — E669 Obesity, unspecified: Secondary | ICD-10-CM | POA: Diagnosis present

## 2015-07-16 DIAGNOSIS — I469 Cardiac arrest, cause unspecified: Secondary | ICD-10-CM | POA: Diagnosis not present

## 2015-07-16 DIAGNOSIS — I509 Heart failure, unspecified: Secondary | ICD-10-CM

## 2015-07-16 DIAGNOSIS — K819 Cholecystitis, unspecified: Secondary | ICD-10-CM | POA: Diagnosis present

## 2015-07-16 DIAGNOSIS — N182 Chronic kidney disease, stage 2 (mild): Secondary | ICD-10-CM | POA: Diagnosis present

## 2015-07-16 DIAGNOSIS — I4891 Unspecified atrial fibrillation: Secondary | ICD-10-CM

## 2015-07-16 DIAGNOSIS — R0602 Shortness of breath: Secondary | ICD-10-CM | POA: Diagnosis present

## 2015-07-16 DIAGNOSIS — I482 Chronic atrial fibrillation: Secondary | ICD-10-CM | POA: Diagnosis present

## 2015-07-16 DIAGNOSIS — F10239 Alcohol dependence with withdrawal, unspecified: Secondary | ICD-10-CM | POA: Diagnosis present

## 2015-07-16 DIAGNOSIS — K761 Chronic passive congestion of liver: Secondary | ICD-10-CM | POA: Diagnosis present

## 2015-07-16 DIAGNOSIS — I428 Other cardiomyopathies: Secondary | ICD-10-CM | POA: Diagnosis present

## 2015-07-16 DIAGNOSIS — Z9114 Patient's other noncompliance with medication regimen: Secondary | ICD-10-CM | POA: Diagnosis not present

## 2015-07-16 DIAGNOSIS — K922 Gastrointestinal hemorrhage, unspecified: Secondary | ICD-10-CM | POA: Diagnosis present

## 2015-07-16 DIAGNOSIS — Z87891 Personal history of nicotine dependence: Secondary | ICD-10-CM | POA: Diagnosis not present

## 2015-07-16 DIAGNOSIS — Z9289 Personal history of other medical treatment: Secondary | ICD-10-CM

## 2015-07-16 DIAGNOSIS — R001 Bradycardia, unspecified: Secondary | ICD-10-CM | POA: Diagnosis present

## 2015-07-16 DIAGNOSIS — Z8249 Family history of ischemic heart disease and other diseases of the circulatory system: Secondary | ICD-10-CM

## 2015-07-16 DIAGNOSIS — K7689 Other specified diseases of liver: Secondary | ICD-10-CM | POA: Diagnosis present

## 2015-07-16 DIAGNOSIS — J440 Chronic obstructive pulmonary disease with acute lower respiratory infection: Secondary | ICD-10-CM | POA: Diagnosis present

## 2015-07-16 DIAGNOSIS — F329 Major depressive disorder, single episode, unspecified: Secondary | ICD-10-CM | POA: Diagnosis present

## 2015-07-16 DIAGNOSIS — I5023 Acute on chronic systolic (congestive) heart failure: Secondary | ICD-10-CM | POA: Diagnosis present

## 2015-07-16 DIAGNOSIS — D6959 Other secondary thrombocytopenia: Secondary | ICD-10-CM | POA: Diagnosis present

## 2015-07-16 DIAGNOSIS — Z8673 Personal history of transient ischemic attack (TIA), and cerebral infarction without residual deficits: Secondary | ICD-10-CM | POA: Diagnosis not present

## 2015-07-16 DIAGNOSIS — J9690 Respiratory failure, unspecified, unspecified whether with hypoxia or hypercapnia: Secondary | ICD-10-CM

## 2015-07-16 DIAGNOSIS — N179 Acute kidney failure, unspecified: Secondary | ICD-10-CM | POA: Diagnosis present

## 2015-07-16 DIAGNOSIS — Z6830 Body mass index (BMI) 30.0-30.9, adult: Secondary | ICD-10-CM | POA: Diagnosis not present

## 2015-07-16 DIAGNOSIS — F1020 Alcohol dependence, uncomplicated: Secondary | ICD-10-CM | POA: Insufficient documentation

## 2015-07-16 DIAGNOSIS — F410 Panic disorder [episodic paroxysmal anxiety] without agoraphobia: Secondary | ICD-10-CM | POA: Diagnosis present

## 2015-07-16 LAB — GLUCOSE, CAPILLARY
GLUCOSE-CAPILLARY: 137 mg/dL — AB (ref 65–99)
GLUCOSE-CAPILLARY: 53 mg/dL — AB (ref 65–99)
GLUCOSE-CAPILLARY: 224 mg/dL — AB (ref 65–99)
Glucose-Capillary: 161 mg/dL — ABNORMAL HIGH (ref 65–99)
Glucose-Capillary: 192 mg/dL — ABNORMAL HIGH (ref 65–99)

## 2015-07-16 LAB — ECHOCARDIOGRAM COMPLETE
AOASC: 26 cm
CHL CUP MV DEC (S): 127
EERAT: 9.74
EWDT: 127 ms
FS: 14 % — AB (ref 28–44)
HEIGHTINCHES: 67 in
IV/PV OW: 0.93
LA diam index: 2.34 cm/m2
LA vol index: 43.2 mL/m2
LA vol: 85 mL
LASIZE: 46 mm
LAVOLA4C: 81.8 mL
LEFT ATRIUM END SYS DIAM: 46 mm
LV E/e' medial: 9.74
LV E/e'average: 9.74
LV PW d: 9.54 mm — AB (ref 0.6–1.1)
LV TDI E'MEDIAL: 7.4
LV e' LATERAL: 11.5 cm/s
LVOT area: 4.91 cm2
LVOTD: 25 mm
MRPISAEROA: 0.42 cm2
MV pk E vel: 112 m/s
MVPG: 5 mmHg
RV LATERAL S' VELOCITY: 7.72 cm/s
RV TAPSE: 22.6 mm
Reg peak vel: 231 cm/s
TDI e' lateral: 11.5
TRMAXVEL: 231 cm/s
VTI: 121 cm
Weight: 2832.47 oz

## 2015-07-16 LAB — POCT I-STAT 3, ART BLOOD GAS (G3+)
Acid-base deficit: 22 mmol/L — ABNORMAL HIGH (ref 0.0–2.0)
Acid-base deficit: 14 mmol/L — ABNORMAL HIGH (ref 0.0–2.0)
Bicarbonate: 13.1 mEq/L — ABNORMAL LOW (ref 20.0–24.0)
Bicarbonate: 12 mEq/L — ABNORMAL LOW (ref 20.0–24.0)
O2 SAT: 11 %
O2 SAT: 100 %
PCO2 ART: 71.9 mmHg — AB (ref 35.0–45.0)
PCO2 ART: 27.4 mmHg — AB (ref 35.0–45.0)
PH ART: 7.249 — AB (ref 7.350–7.450)
PO2 ART: 19 mmHg — AB (ref 80.0–100.0)
PO2 ART: 214 mmHg — AB (ref 80.0–100.0)
TCO2: 15 mmol/L (ref 0–100)
TCO2: 13 mmol/L (ref 0–100)
pH, Arterial: 6.87 — CL (ref 7.350–7.450)

## 2015-07-16 LAB — URINE MICROSCOPIC-ADD ON

## 2015-07-16 LAB — COMPREHENSIVE METABOLIC PANEL
ALBUMIN: 4.1 g/dL (ref 3.5–5.0)
ALK PHOS: 74 U/L (ref 38–126)
ALT: 34 U/L (ref 17–63)
ANION GAP: 12 (ref 5–15)
AST: 56 U/L — ABNORMAL HIGH (ref 15–41)
BILIRUBIN TOTAL: 1.6 mg/dL — AB (ref 0.3–1.2)
BUN: 14 mg/dL (ref 6–20)
CALCIUM: 9.8 mg/dL (ref 8.9–10.3)
CO2: 18 mmol/L — ABNORMAL LOW (ref 22–32)
Chloride: 106 mmol/L (ref 101–111)
Creatinine, Ser: 1.48 mg/dL — ABNORMAL HIGH (ref 0.61–1.24)
GFR calc Af Amer: >60 mL/min (ref >60)
GFR, EST NON AFRICAN AMERICAN: 53 mL/min — AB (ref >60)
GLUCOSE: 107 mg/dL — AB (ref 65–99)
Potassium: 4.2 mmol/L (ref 3.5–5.1)
Sodium: 136 mmol/L (ref 135–145)
TOTAL PROTEIN: 7.4 g/dL (ref 6.5–8.1)

## 2015-07-16 LAB — I-STAT CHEM 8, ED
BUN: 23 mg/dL — ABNORMAL HIGH (ref 6–20)
CALCIUM ION: 1.04 mmol/L — AB (ref 1.12–1.23)
CHLORIDE: 106 mmol/L (ref 101–111)
Creatinine, Ser: 1.4 mg/dL — ABNORMAL HIGH (ref 0.61–1.24)
Glucose, Bld: 97 mg/dL (ref 65–99)
HCT: 53 % — ABNORMAL HIGH (ref 39.0–52.0)
HEMOGLOBIN: 18 g/dL — AB (ref 13.0–17.0)
POTASSIUM: 6.9 mmol/L — AB (ref 3.5–5.1)
Sodium: 137 mmol/L (ref 135–145)
TCO2: 21 mmol/L (ref 0–100)

## 2015-07-16 LAB — BASIC METABOLIC PANEL
Anion gap: 14 (ref 5–15)
Anion gap: 15 (ref 5–15)
BUN: 18 mg/dL (ref 6–20)
BUN: 20 mg/dL (ref 6–20)
CALCIUM: 9 mg/dL (ref 8.9–10.3)
CHLORIDE: 104 mmol/L (ref 101–111)
CO2: 15 mmol/L — AB (ref 22–32)
CO2: 20 mmol/L — ABNORMAL LOW (ref 22–32)
CREATININE: 1.72 mg/dL — AB (ref 0.61–1.24)
CREATININE: 2.14 mg/dL — AB (ref 0.61–1.24)
Calcium: 8 mg/dL — ABNORMAL LOW (ref 8.9–10.3)
Chloride: 110 mmol/L (ref 101–111)
GFR calc Af Amer: 52 mL/min — ABNORMAL LOW (ref >60)
GFR calc non Af Amer: 45 mL/min — ABNORMAL LOW (ref >60)
GFR, EST AFRICAN AMERICAN: 40 mL/min — AB (ref >60)
GFR, EST NON AFRICAN AMERICAN: 34 mL/min — AB (ref >60)
GLUCOSE: 103 mg/dL — AB (ref 65–99)
Glucose, Bld: 188 mg/dL — ABNORMAL HIGH (ref 65–99)
POTASSIUM: 3.9 mmol/L (ref 3.5–5.1)
Potassium: 6.4 mmol/L (ref 3.5–5.1)
SODIUM: 139 mmol/L (ref 135–145)
Sodium: 139 mmol/L (ref 135–145)

## 2015-07-16 LAB — BLOOD GAS, ARTERIAL
Acid-base deficit: 7.4 mmol/L — ABNORMAL HIGH (ref 0.0–2.0)
Bicarbonate: 16.3 mEq/L — ABNORMAL LOW (ref 20.0–24.0)
FIO2: 100
MECHVT: 530 mL
O2 Saturation: 99.5 %
PATIENT TEMPERATURE: 97.9
PEEP: 5 cmH2O
PO2 ART: 373 mmHg — AB (ref 80.0–100.0)
RATE: 35 resp/min
TCO2: 17 mmol/L (ref 0–100)
pCO2 arterial: 25 mmHg — ABNORMAL LOW (ref 35.0–45.0)
pH, Arterial: 7.427 (ref 7.350–7.450)

## 2015-07-16 LAB — URINALYSIS, ROUTINE W REFLEX MICROSCOPIC
BILIRUBIN URINE: NEGATIVE
Glucose, UA: NEGATIVE mg/dL
Glucose, UA: 100 mg/dL — AB
Hgb urine dipstick: NEGATIVE
KETONES UR: NEGATIVE mg/dL
Ketones, ur: NEGATIVE mg/dL
Leukocytes, UA: NEGATIVE
Leukocytes, UA: NEGATIVE
NITRITE: NEGATIVE
Nitrite: NEGATIVE
Protein, ur: NEGATIVE mg/dL
Specific Gravity, Urine: 1.015 (ref 1.005–1.030)
Specific Gravity, Urine: 1.043 — ABNORMAL HIGH (ref 1.005–1.030)
pH: 5 (ref 5.0–8.0)
pH: 5.5 (ref 5.0–8.0)

## 2015-07-16 LAB — CBC
HCT: 49.1 % (ref 39.0–52.0)
HEMOGLOBIN: 16.5 g/dL (ref 13.0–17.0)
MCH: 32.4 pg (ref 26.0–34.0)
MCHC: 33.6 g/dL (ref 30.0–36.0)
MCV: 96.5 fL (ref 78.0–100.0)
Platelets: 93 10*3/uL — ABNORMAL LOW (ref 150–400)
RBC: 5.09 MIL/uL (ref 4.22–5.81)
RDW: 13.7 % (ref 11.5–15.5)
WBC: 6.7 10*3/uL (ref 4.0–10.5)

## 2015-07-16 LAB — TROPONIN I
TROPONIN I: 0.04 ng/mL — AB (ref <0.031)
TROPONIN I: 0.06 ng/mL — AB (ref <0.031)
TROPONIN I: 0.14 ng/mL — AB (ref <0.031)
Troponin I: 0.06 ng/mL — ABNORMAL HIGH (ref <0.031)

## 2015-07-16 LAB — LACTIC ACID, PLASMA
LACTIC ACID, VENOUS: 12.4 mmol/L — AB (ref 0.5–2.0)
LACTIC ACID, VENOUS: 6.8 mmol/L — AB (ref 0.5–2.0)

## 2015-07-16 LAB — I-STAT TROPONIN, ED: TROPONIN I, POC: 0.04 ng/mL (ref 0.00–0.08)

## 2015-07-16 LAB — MRSA PCR SCREENING: MRSA BY PCR: NEGATIVE

## 2015-07-16 LAB — DIGOXIN LEVEL: Digoxin Level: <0.2 ng/mL — ABNORMAL LOW (ref 0.8–2.0)

## 2015-07-16 LAB — BRAIN NATRIURETIC PEPTIDE: B Natriuretic Peptide: 594.3 pg/mL — ABNORMAL HIGH (ref 0.0–100.0)

## 2015-07-16 LAB — PROTIME-INR
INR: 2.78 — ABNORMAL HIGH (ref 0.00–1.49)
Prothrombin Time: 28.9 seconds — ABNORMAL HIGH (ref 11.6–15.2)

## 2015-07-16 LAB — LIPASE, BLOOD: Lipase: 22 U/L (ref 11–51)

## 2015-07-16 MED ORDER — SODIUM POLYSTYRENE SULFONATE 15 GM/60ML PO SUSP
30.0000 g | Freq: Once | ORAL | Status: AC
  Administered 2015-07-16: 30 g
  Filled 2015-07-16: qty 120

## 2015-07-16 MED ORDER — PERFLUTREN LIPID MICROSPHERE
1.0000 mL | INTRAVENOUS | Status: AC | PRN
  Administered 2015-07-16: 2 mL via INTRAVENOUS
  Filled 2015-07-16: qty 10

## 2015-07-16 MED ORDER — LORAZEPAM 2 MG/ML IJ SOLN
INTRAMUSCULAR | Status: AC
  Administered 2015-07-16: 1 mg via INTRAVENOUS
  Filled 2015-07-16: qty 1

## 2015-07-16 MED ORDER — SODIUM POLYSTYRENE SULFONATE 15 GM/60ML PO SUSP: 45.0000 g | Freq: Once | ORAL | Status: DC

## 2015-07-16 MED ORDER — SACUBITRIL-VALSARTAN 24-26 MG PO TABS
1.0000 | ORAL_TABLET | Freq: Two times a day (BID) | ORAL | Status: DC
  Administered 2015-07-16: 1 via ORAL
  Filled 2015-07-16: qty 1

## 2015-07-16 MED ORDER — AMIODARONE HCL IN DEXTROSE 360-4.14 MG/200ML-% IV SOLN
60.0000 mg/h | INTRAVENOUS | Status: AC
  Administered 2015-07-16: 200 mL via INTRAVENOUS

## 2015-07-16 MED ORDER — AMIODARONE LOAD VIA INFUSION
150.0000 mg | Freq: Once | INTRAVENOUS | Status: DC
  Filled 2015-07-16: qty 83.34

## 2015-07-16 MED ORDER — SODIUM CHLORIDE 0.9% FLUSH: 3.0000 mL | INTRAVENOUS | Status: DC | PRN

## 2015-07-16 MED ORDER — SODIUM CHLORIDE 0.9 % IV SOLN: 250.0000 mL | INTRAVENOUS | Status: DC | PRN

## 2015-07-16 MED ORDER — AMIODARONE HCL IN DEXTROSE 360-4.14 MG/200ML-% IV SOLN: 60.0000 mg/h | INTRAVENOUS | Status: DC

## 2015-07-16 MED ORDER — CHLORHEXIDINE GLUCONATE 0.12% ORAL RINSE (MEDLINE KIT)
15.0000 mL | Freq: Two times a day (BID) | OROMUCOSAL | Status: DC
  Administered 2015-07-16 (×2): 15 mL via OROMUCOSAL

## 2015-07-16 MED ORDER — CALCIUM GLUCONATE 10 % IV SOLN
1.0000 g | Freq: Once | INTRAVENOUS | Status: DC
  Filled 2015-07-16: qty 10

## 2015-07-16 MED ORDER — AMIODARONE HCL IN DEXTROSE 360-4.14 MG/200ML-% IV SOLN
60.0000 mg/h | INTRAVENOUS | Status: DC
  Administered 2015-07-16 (×2): 30 mg/h via INTRAVENOUS
  Administered 2015-07-17: 60 mg/h via INTRAVENOUS
  Administered 2015-07-17: 30 mg/h via INTRAVENOUS
  Administered 2015-07-18: 60 mg/h via INTRAVENOUS
  Filled 2015-07-16 (×5): qty 200

## 2015-07-16 MED ORDER — VITAL 1.5 CAL PO LIQD
1000.0000 mL | ORAL | Status: DC
  Administered 2015-07-16: 1000 mL
  Filled 2015-07-16 (×5): qty 1000

## 2015-07-16 MED ORDER — PRO-STAT SUGAR FREE PO LIQD
30.0000 mL | Freq: Every day | ORAL | Status: DC
  Administered 2015-07-16 – 2015-07-17 (×4): 30 mL
  Filled 2015-07-16 (×5): qty 30

## 2015-07-16 MED ORDER — DIGOXIN 250 MCG PO TABS
0.2500 mg | ORAL_TABLET | Freq: Every day | ORAL | Status: DC
  Administered 2015-07-16: 0.25 mg via ORAL
  Filled 2015-07-16: qty 1

## 2015-07-16 MED ORDER — SODIUM CHLORIDE 0.9% FLUSH
3.0000 mL | Freq: Two times a day (BID) | INTRAVENOUS | Status: DC
  Administered 2015-07-16 – 2015-07-17 (×4): 3 mL via INTRAVENOUS

## 2015-07-16 MED ORDER — LORAZEPAM 2 MG/ML IJ SOLN
0.5000 mg | Freq: Once | INTRAMUSCULAR | Status: AC
  Administered 2015-07-16: 0.5 mg via INTRAVENOUS
  Filled 2015-07-16: qty 1

## 2015-07-16 MED ORDER — SODIUM BICARBONATE 8.4 % IV SOLN
INTRAVENOUS | Status: DC
  Filled 2015-07-16 (×2): qty 100

## 2015-07-16 MED ORDER — ALPRAZOLAM 0.25 MG PO TABS
0.2500 mg | ORAL_TABLET | Freq: Two times a day (BID) | ORAL | Status: DC | PRN
  Administered 2015-07-16: 0.25 mg via ORAL
  Filled 2015-07-16: qty 1

## 2015-07-16 MED ORDER — ONDANSETRON HCL 4 MG/2ML IJ SOLN: 4.0000 mg | Freq: Four times a day (QID) | INTRAMUSCULAR | Status: DC | PRN

## 2015-07-16 MED ORDER — ADULT MULTIVITAMIN W/MINERALS CH
1.0000 | ORAL_TABLET | Freq: Every day | ORAL | Status: DC
  Administered 2015-07-16 – 2015-07-18 (×3): 1 via ORAL
  Filled 2015-07-16 (×3): qty 1

## 2015-07-16 MED ORDER — DEXMEDETOMIDINE HCL IN NACL 200 MCG/50ML IV SOLN
0.2000 ug/kg/h | INTRAVENOUS | Status: DC
  Administered 2015-07-16: 0.2 ug/kg/h via INTRAVENOUS
  Administered 2015-07-16: 0.5 ug/kg/h via INTRAVENOUS
  Administered 2015-07-17: 0.4 ug/kg/h via INTRAVENOUS
  Administered 2015-07-17: 0.2 ug/kg/h via INTRAVENOUS
  Filled 2015-07-16 (×5): qty 50

## 2015-07-16 MED ORDER — ATORVASTATIN CALCIUM 40 MG PO TABS: 40.0000 mg | ORAL_TABLET | Freq: Every day | ORAL | Status: DC

## 2015-07-16 MED ORDER — SODIUM CHLORIDE 0.9 % IV BOLUS (SEPSIS)
1000.0000 mL | Freq: Once | INTRAVENOUS | Status: AC
  Administered 2015-07-16: 1000 mL via INTRAVENOUS

## 2015-07-16 MED ORDER — SODIUM BICARBONATE 8.4 % IV SOLN
INTRAVENOUS | Status: DC
  Administered 2015-07-16 – 2015-07-17 (×3): via INTRAVENOUS
  Filled 2015-07-16 (×5): qty 150

## 2015-07-16 MED ORDER — FUROSEMIDE 10 MG/ML IJ SOLN: 40.0000 mg | Freq: Two times a day (BID) | INTRAMUSCULAR | Status: DC

## 2015-07-16 MED ORDER — SODIUM CHLORIDE 0.9 % IV SOLN
25.0000 ug/h | INTRAVENOUS | Status: DC
  Administered 2015-07-16: 75 ug/h via INTRAVENOUS
  Administered 2015-07-17: 150 ug/h via INTRAVENOUS
  Filled 2015-07-16 (×2): qty 50

## 2015-07-16 MED ORDER — SODIUM CHLORIDE 0.9% FLUSH: 10.0000 mL | INTRAVENOUS | Status: DC | PRN

## 2015-07-16 MED ORDER — AMIODARONE HCL IN DEXTROSE 360-4.14 MG/200ML-% IV SOLN: 30.0000 mg/h | INTRAVENOUS | Status: DC

## 2015-07-16 MED ORDER — MILRINONE LACTATE IN DEXTROSE 20-5 MG/100ML-% IV SOLN
0.2500 ug/kg/min | INTRAVENOUS | Status: DC
  Administered 2015-07-16 – 2015-07-17 (×2): 0.25 ug/kg/min via INTRAVENOUS
  Filled 2015-07-16: qty 100

## 2015-07-16 MED ORDER — SODIUM CHLORIDE 0.9% FLUSH
10.0000 mL | Freq: Two times a day (BID) | INTRAVENOUS | Status: DC
  Administered 2015-07-16 – 2015-07-17 (×2): 10 mL

## 2015-07-16 MED ORDER — ANTISEPTIC ORAL RINSE SOLUTION (CORINZ)
7.0000 mL | OROMUCOSAL | Status: DC
  Administered 2015-07-16 – 2015-07-18 (×16): 7 mL via OROMUCOSAL

## 2015-07-16 MED ORDER — CALCIUM CHLORIDE 10 % IV SOLN
1.0000 g | Freq: Once | INTRAVENOUS | Status: DC
Start: 2015-07-16 — End: 2015-07-18
  Filled 2015-07-16 (×4): qty 10

## 2015-07-16 MED ORDER — ALBUTEROL SULFATE (2.5 MG/3ML) 0.083% IN NEBU: 2.5000 mg | INHALATION_SOLUTION | RESPIRATORY_TRACT | Status: DC | PRN

## 2015-07-16 MED ORDER — GUAIFENESIN 100 MG/5ML PO SOLN
5.0000 mL | ORAL | Status: DC | PRN
Start: 2015-07-16 — End: 2015-07-16
  Filled 2015-07-16: qty 5

## 2015-07-16 MED ORDER — DEXTROSE 5 % IV SOLN
500.0000 mg | INTRAVENOUS | Status: DC
  Administered 2015-07-16 – 2015-07-18 (×3): 500 mg via INTRAVENOUS
  Filled 2015-07-16 (×4): qty 500

## 2015-07-16 MED ORDER — DEXTROSE 5 % IV SOLN
1.0000 g | INTRAVENOUS | Status: DC
  Administered 2015-07-16 – 2015-07-18 (×3): 1 g via INTRAVENOUS
  Filled 2015-07-16 (×4): qty 10

## 2015-07-16 MED ORDER — PERFLUTREN LIPID MICROSPHERE
INTRAVENOUS | Status: AC
  Filled 2015-07-16: qty 10

## 2015-07-16 MED ORDER — NOREPINEPHRINE BITARTRATE 1 MG/ML IV SOLN
2.0000 ug/min | INTRAVENOUS | Status: DC
  Administered 2015-07-16 (×2): 50 ug/min via INTRAVENOUS
  Filled 2015-07-16: qty 4

## 2015-07-16 MED ORDER — IOPAMIDOL (ISOVUE-370) INJECTION 76%
INTRAVENOUS | Status: AC
  Administered 2015-07-16: 03:00:00
  Filled 2015-07-16: qty 100

## 2015-07-16 MED ORDER — FAMOTIDINE IN NACL 20-0.9 MG/50ML-% IV SOLN
20.0000 mg | INTRAVENOUS | Status: DC
  Administered 2015-07-16: 20 mg via INTRAVENOUS
  Filled 2015-07-16: qty 50

## 2015-07-16 MED ORDER — ANTISEPTIC ORAL RINSE SOLUTION (CORINZ): 7.0000 mL | Freq: Four times a day (QID) | OROMUCOSAL | Status: DC

## 2015-07-16 MED ORDER — ACETAMINOPHEN 325 MG PO TABS: 650.0000 mg | ORAL_TABLET | ORAL | Status: DC | PRN

## 2015-07-16 MED ORDER — INSULIN ASPART 100 UNIT/ML IV SOLN
10.0000 [IU] | Freq: Once | INTRAVENOUS | Status: AC
  Administered 2015-07-16: 10 [IU] via INTRAVENOUS

## 2015-07-16 MED ORDER — FOLIC ACID 1 MG PO TABS
1.0000 mg | ORAL_TABLET | Freq: Every day | ORAL | Status: DC
  Administered 2015-07-16: 1 mg via ORAL
  Filled 2015-07-16: qty 1

## 2015-07-16 MED ORDER — APIXABAN 5 MG PO TABS
5.0000 mg | ORAL_TABLET | Freq: Two times a day (BID) | ORAL | Status: DC
  Administered 2015-07-16: 5 mg via ORAL
  Filled 2015-07-16 (×2): qty 1

## 2015-07-16 MED ORDER — ALBUTEROL SULFATE (2.5 MG/3ML) 0.083% IN NEBU: 2.5000 mg | INHALATION_SOLUTION | Freq: Four times a day (QID) | RESPIRATORY_TRACT | Status: DC | PRN

## 2015-07-16 MED ORDER — DEXTROSE 50 % IV SOLN
1.0000 | Freq: Once | INTRAVENOUS | Status: AC
  Administered 2015-07-16: 25 mL via INTRAVENOUS

## 2015-07-16 MED ORDER — CHLORHEXIDINE GLUCONATE 0.12% ORAL RINSE (MEDLINE KIT)
15.0000 mL | Freq: Two times a day (BID) | OROMUCOSAL | Status: DC
  Administered 2015-07-17 – 2015-07-18 (×3): 15 mL via OROMUCOSAL

## 2015-07-16 MED ORDER — SODIUM POLYSTYRENE SULFONATE 15 GM/60ML PO SUSP: 30.0000 g | Freq: Once | ORAL | Status: DC

## 2015-07-16 MED ORDER — LORAZEPAM 2 MG/ML IJ SOLN
1.0000 mg | Freq: Once | INTRAMUSCULAR | Status: AC
Start: 2015-07-16 — End: 2015-07-16
  Administered 2015-07-16: 1 mg via INTRAVENOUS

## 2015-07-16 MED ORDER — SODIUM BICARBONATE 8.4 % IV SOLN
INTRAVENOUS | Status: AC
  Administered 2015-07-16: 50 meq
  Filled 2015-07-16: qty 150

## 2015-07-16 MED ORDER — DEXTROSE 5 % IV SOLN
2.0000 ug/min | INTRAVENOUS | Status: DC
  Administered 2015-07-16: 20 ug/min via INTRAVENOUS
  Administered 2015-07-17: 45 ug/min via INTRAVENOUS
  Administered 2015-07-18 (×2): 60 ug/min via INTRAVENOUS
  Filled 2015-07-16 (×5): qty 16

## 2015-07-16 MED ORDER — SODIUM BICARBONATE 8.4 % IV SOLN
INTRAVENOUS | Status: AC
  Filled 2015-07-16: qty 150

## 2015-07-16 MED ORDER — VITAMIN B-1 100 MG PO TABS
100.0000 mg | ORAL_TABLET | Freq: Every day | ORAL | Status: DC
  Administered 2015-07-16 – 2015-07-18 (×3): 100 mg via ORAL
  Filled 2015-07-16 (×3): qty 1

## 2015-07-16 MED ORDER — AMIODARONE HCL IN DEXTROSE 360-4.14 MG/200ML-% IV SOLN
INTRAVENOUS | Status: AC
  Administered 2015-07-16: 200 mL via INTRAVENOUS
  Filled 2015-07-16: qty 200

## 2015-07-16 NOTE — Progress Notes (Signed)
eLink Physician-Brief Progress Note Patient Name: [name] DOB: September 04, 1965 MRN: [id number]   Date of Service  07/12/2015  HPI/Events of Note    eICU Interventions  OK to use CVL     Intervention Category Minor Interventions: Routine modifications to care plan (e.g. PRN medications for pain, fever)  [name] 07/11/2015, 4:21 PM

## 2015-07-16 NOTE — ED Provider Notes (Signed)
CSN: [id number]     Arrival date & time 07/17/2015  0028 History   First MD Initiated Contact with Patient 07/06/2015 [phone]     Chief Complaint  Patient presents with  . Abdominal Pain  . Tachycardia     (Consider location/radiation/quality/duration/timing/severity/associated sxs/prior Treatment) HPI   Patient has multiple PMH of hypertension, anxiety and depression, paroxysmal a.fib with RVR, depression, anxiety, myocardial infarction, stroke, headache, CKD, COPD comes to the ER with complaints of SOB and a.fib with RVR.  Approximately 3 hours prior to arrival he developed sensation of heart racing with shortness of breat, anxiety and epigastric abdominal pain. The symptoms have worsened over the past few hours. He has not complained of any weakness or numbness. He denies CP. He had a TEE done recently which showed a thrombus, he takes Eliquis and says he is complaint with this medication.  Past Medical History  Diagnosis Date  . Hypertension   . Alcoholism /alcohol abuse (HCC)   . Anxiety and depression   . Tobacco abuse   . Atrial fibrillation (HCC)   . Anxiety   . Depression   . Myocardial infarction (HCC)   . Stroke (HCC)   . History of hiatal hernia   . Headache   . CKD (chronic kidney disease), stage II   . COPD (chronic obstructive pulmonary disease) [facility])    Past Surgical History  Procedure Laterality Date  . Hernia repair      UHR x2  . Tee without cardioversion N/A 08/01/2014    Procedure: TRANSESOPHAGEAL ECHOCARDIOGRAM (TEE);  Surgeon: Orpah Cobb, MD;  Location: [facility] ENDOSCOPY;  Service: Cardiovascular;  Laterality: N/A;  . Fracture surgery      right FA  . Tee without cardioversion N/A 05/15/2015    Procedure: TRANSESOPHAGEAL ECHOCARDIOGRAM (TEE);  Surgeon: Orpah Cobb, MD;  Location: [facility] ENDOSCOPY;  Service: Cardiovascular;  Laterality: N/A;   Family History  Problem Relation Age of Onset  . Heart attack Mother   . Heart attack Father   . Hypertension Mother   .  Hypertension Father    Social History  Substance Use Topics  . Smoking status: Former Smoker -- 1.00 packs/day for 30 years    Types: Cigarettes    Quit date: 05/02/2014  . Smokeless tobacco: Never Used  . Alcohol Use: Yes     Comment: pt states he doesn't drink anymore    Review of Systems  Level V caveat- pt in distress  Allergies  Bee venom  Home Medications   Prior to Admission medications   Medication Sig Start Date End Date Taking? Authorizing Provider  albuterol (PROVENTIL HFA;VENTOLIN HFA) 108 (90 BASE) MCG/ACT inhaler Inhale 2 puffs into the lungs every 6 (six) hours as needed for wheezing or shortness of breath. 07/08/14   Standley Brooking, MD  apixaban (ELIQUIS) 5 MG TABS tablet Take 1 tablet (5 mg total) by mouth 2 (two) times daily. 06/04/15   Linwood Dibbles, MD  atorvastatin (LIPITOR) 40 MG tablet Take 1 tablet (40 mg total) by mouth daily at 6 PM. 06/04/15   Linwood Dibbles, MD  azithromycin (ZITHROMAX) 250 MG tablet Take 1 tablet (250 mg total) by mouth daily. For 2 additional days 06/13/15   Leatha Gilding, MD  digoxin (LANOXIN) 0.25 MG tablet Take 1 tablet (0.25 mg total) by mouth daily. 06/13/15   Costin Otelia Sergeant, MD  furosemide (LASIX) 20 MG tablet Take 2 tablets (40 mg total) by mouth daily. 06/13/15   Costin Otelia Sergeant, MD  ibuprofen (ADVIL,MOTRIN) 200 MG tablet Take 200 mg by mouth every 6 (six) hours as needed for moderate pain.    Historical Provider, MD  metoprolol (LOPRESSOR) 50 MG tablet Take 1.5 tablets (75 mg total) by mouth 2 (two) times daily. 06/13/15   Costin Otelia Sergeant, MD  oxyCODONE-acetaminophen (PERCOCET/ROXICET) 5-325 MG tablet Take 2 tablets by mouth every 6 (six) hours as needed for severe pain. 06/13/15   Costin Otelia Sergeant, MD  potassium chloride SA (K-DUR,KLOR-CON) 20 MEQ tablet Take 1 tablet (20 mEq total) by mouth daily. 06/04/15   Linwood Dibbles, MD  predniSONE (DELTASONE) 20 MG tablet Take 1 tablet (20 mg total) by mouth daily with breakfast. For 2 additional days  06/13/15   Leatha Gilding, MD  sacubitril-valsartan (ENTRESTO) 24-26 MG Take 1 tablet by mouth 2 (two) times daily. 06/13/15   Costin Otelia Sergeant, MD   BP 132/83 mmHg  Pulse 70  Temp(Src) 97.7 F (36.5 C) (Oral)  Resp 38  Ht 5\' 7"  (1.702 m)  Wt 82.101 kg  BMI 28.34 kg/m2  SpO2 89% Physical Exam  Constitutional: He is oriented to person, place, and time. He appears well-developed and well-nourished. He appears distressed.  HENT:  Head: Normocephalic and atraumatic.  Right Ear: Tympanic membrane and ear canal normal.  Left Ear: Tympanic membrane and ear canal normal.  Nose: Nose normal.  Mouth/Throat: Uvula is midline, oropharynx is clear and moist and mucous membranes are normal.  Eyes: Pupils are equal, round, and reactive to light.  Neck: Normal range of motion. Neck supple.  Cardiovascular: Normal rate and regular rhythm.   Pulmonary/Chest: Effort normal. No respiratory distress. He has no wheezes. He has no rales.  Abdominal: Soft. He exhibits no distension. There is tenderness (epigastric tenderness). There is no rebound and no guarding.  No signs of abdominal distention  Musculoskeletal:  No LE swelling  Neurological: He is alert and oriented to person, place, and time.  Pt is awake and alert.  Skin: Skin is warm. No rash noted. He is diaphoretic.  Nursing note and vitals reviewed.   ED Course  Procedures (including critical care time) Labs Review Labs Reviewed  COMPREHENSIVE METABOLIC PANEL - Abnormal; Notable for the following:    CO2 18 (*)    Glucose, Bld 107 (*)    Creatinine, Ser 1.48 (*)    AST 56 (*)    Total Bilirubin 1.6 (*)    GFR calc non Af Amer 53 (*)    All other components within normal limits  CBC - Abnormal; Notable for the following:    Platelets 93 (*)    All other components within normal limits  I-STAT CHEM 8, ED - Abnormal; Notable for the following:    Potassium 6.9 (*)    BUN 23 (*)    Creatinine, Ser 1.40 (*)    Calcium, Ion 1.04 (*)     Hemoglobin 18.0 (*)    HCT 53.0 (*)    All other components within normal limits  LIPASE, BLOOD  URINALYSIS, ROUTINE W REFLEX MICROSCOPIC (NOT AT [facility])  PROTIME-INR  DIGOXIN LEVEL  BRAIN NATRIURETIC PEPTIDE  TROPONIN I  TROPONIN I  TROPONIN I  I-STAT TROPOININ, ED    Imaging Review Ct Angio Chest Pe W/cm &/or Wo Cm  27-Jul-2015  CLINICAL DATA:  Sudden onset left-sided abdominal pain, shortness of breath, left arm and left leg numbness for 15 minutes. EXAM: CT ANGIOGRAPHY CHEST CT ABDOMEN AND PELVIS WITH CONTRAST TECHNIQUE: Multidetector CT imaging of the  chest was performed using the standard protocol during bolus administration of intravenous contrast. Multiplanar CT image reconstructions and MIPs were obtained to evaluate the vascular anatomy. Multidetector CT imaging of the abdomen and pelvis was performed using the standard protocol during bolus administration of intravenous contrast. CONTRAST:  100 mL Isovue 370 COMPARISON:  CT abdomen and pelvis 06/10/2015 FINDINGS: CTA CHEST FINDINGS Technically adequate study with good opacification of the central and segmental pulmonary arteries. No focal filling defects demonstrated. No evidence of significant pulmonary embolus. Cardiac enlargement with reflux of contrast material into the inferior vena cava and hepatic veins consistent with right heart failure. Normal caliber thoracic aorta. Calcified lymph nodes in the mediastinum consistent with old granulomatous infection. No significant lymphadenopathy in the chest. Esophagus is decompressed. Evaluation of lungs is limited due to motion artifact. There is a small right pleural effusion with fluid in the left major fissure. Interstitial changes likely represent mild edema. There are some patchy focal areas of infiltration in the mid lungs bilaterally possibly representing pneumonia. No pneumothorax. Airways are patent. CT ABDOMEN and PELVIS FINDINGS Examination is technically limited due to motion  artifact. Passive hepatic congestion. No focal liver lesions identified. Gallbladder demonstrates diffusely thickened wall, nonspecific but possibly indicating cholecystitis. No stones identified. No bile duct dilatation. The pancreas, spleen, adrenal glands, kidneys, abdominal aorta, inferior vena cava, and retroperitoneal lymph nodes are unremarkable. Stomach, small bowel, and colon are decompressed. No free air or free fluid in the abdomen. Pelvis: Bladder is decompressed. Prostate gland is not enlarged. No free or loculated pelvic fluid collections. No pelvic mass or lymphadenopathy. Appendix is normal. No evidence of diverticulitis. Mobile testes are noted in the lower inguinal canals bilaterally. Degenerative changes in the spine. No destructive bone lesions. Review of the MIP images confirms the above findings. IMPRESSION: No evidence of significant pulmonary embolus. Changes of right heart failure with passive hepatic congestion. Small bilateral pleural effusions. Mild interstitial edema. Focal patchy areas of airspace disease in both lungs may represent superimposed pneumonia. Gallbladder wall is thickened. This may indicate cholecystitis. Electronically Signed   By: Burman Nieves M.D.   On: 2015/08/11 03:29   Ct Abdomen Pelvis W Contrast  08-11-2015  CLINICAL DATA:  Sudden onset left-sided abdominal pain, shortness of breath, left arm and left leg numbness for 15 minutes. EXAM: CT ANGIOGRAPHY CHEST CT ABDOMEN AND PELVIS WITH CONTRAST TECHNIQUE: Multidetector CT imaging of the chest was performed using the standard protocol during bolus administration of intravenous contrast. Multiplanar CT image reconstructions and MIPs were obtained to evaluate the vascular anatomy. Multidetector CT imaging of the abdomen and pelvis was performed using the standard protocol during bolus administration of intravenous contrast. CONTRAST:  100 mL Isovue 370 COMPARISON:  CT abdomen and pelvis 06/10/2015 FINDINGS: CTA  CHEST FINDINGS Technically adequate study with good opacification of the central and segmental pulmonary arteries. No focal filling defects demonstrated. No evidence of significant pulmonary embolus. Cardiac enlargement with reflux of contrast material into the inferior vena cava and hepatic veins consistent with right heart failure. Normal caliber thoracic aorta. Calcified lymph nodes in the mediastinum consistent with old granulomatous infection. No significant lymphadenopathy in the chest. Esophagus is decompressed. Evaluation of lungs is limited due to motion artifact. There is a small right pleural effusion with fluid in the left major fissure. Interstitial changes likely represent mild edema. There are some patchy focal areas of infiltration in the mid lungs bilaterally possibly representing pneumonia. No pneumothorax. Airways are patent. CT ABDOMEN and PELVIS FINDINGS  Examination is technically limited due to motion artifact. Passive hepatic congestion. No focal liver lesions identified. Gallbladder demonstrates diffusely thickened wall, nonspecific but possibly indicating cholecystitis. No stones identified. No bile duct dilatation. The pancreas, spleen, adrenal glands, kidneys, abdominal aorta, inferior vena cava, and retroperitoneal lymph nodes are unremarkable. Stomach, small bowel, and colon are decompressed. No free air or free fluid in the abdomen. Pelvis: Bladder is decompressed. Prostate gland is not enlarged. No free or loculated pelvic fluid collections. No pelvic mass or lymphadenopathy. Appendix is normal. No evidence of diverticulitis. Mobile testes are noted in the lower inguinal canals bilaterally. Degenerative changes in the spine. No destructive bone lesions. Review of the MIP images confirms the above findings. IMPRESSION: No evidence of significant pulmonary embolus. Changes of right heart failure with passive hepatic congestion. Small bilateral pleural effusions. Mild interstitial edema.  Focal patchy areas of airspace disease in both lungs may represent superimposed pneumonia. Gallbladder wall is thickened. This may indicate cholecystitis. Electronically Signed   By: Burman Nieves M.D.   On: 08-07-15 03:29   Dg Chest Port 1 View  August 07, 2015  CLINICAL DATA:  Acute onset of shortness of breath. Atrial fibrillation. Initial encounter. EXAM: PORTABLE CHEST 1 VIEW COMPARISON:  Chest radiograph from 06/09/2015 FINDINGS: The lungs are well-aerated. Mild bibasilar opacities may reflect atelectasis or mild interstitial edema. There is no evidence of pleural effusion or pneumothorax. The cardiomediastinal silhouette is mildly enlarged. No acute osseous abnormalities are seen. IMPRESSION: Mild bibasilar opacities may reflect atelectasis or mild interstitial edema. Mild cardiomegaly. Electronically Signed   By: Roanna Raider M.D.   On: Aug 07, 2015 01:52   I have personally reviewed and evaluated these images and lab results as part of my medical decision-making.   EKG Interpretation None      MDM   Final diagnoses:  Atrial fibrillation, unspecified type (HCC)    CRITICAL CARE Performed by: Dorthula Matas Total critical care time: 45 minutes Critical care time was exclusive of separately billable procedures and treating other patients. Critical care was necessary to treat or prevent imminent or life-threatening deterioration. Critical care was time spent personally by me on the following activities: development of treatment plan with patient and/or surrogate as well as nursing, discussions with consultants, evaluation of patient's response to treatment, examination of patient, obtaining history from patient or surrogate, ordering and performing treatments and interventions, ordering and review of laboratory studies, ordering and review of radiographic studies, pulse oximetry and re-evaluation of patient's condition.  Dr. Blinda Leatherwood was closely involved in the care of this  patient. Fluids initially given due to low blood pressure unable to give cardizem.  Blood pressure continued to worsen to 81/69, despite 2 liters of NS. Dr. Algie Coffer called for assistance. He is in ED to see patient and has assumed care and will be admitting this patient. @ 3:25 am     Marlon Pel, PA-C August 07, 2015 1610  Gilda Crease, MD 2015/08/07 [id number]  Gilda Crease, MD 08/07/15 0400

## 2015-07-16 NOTE — Procedures (Signed)
Central Venous Catheter Insertion Procedure Note [place] Dirksen [id number] 1965-10-13  Procedure: Insertion of Central Venous Catheter Indications: Drug and/or fluid administration  Procedure Details Consent: Unable to obtain consent because of emergent medical necessity. Time Out: Verified patient identification, verified procedure, site/side was marked, verified correct patient position, special equipment/implants available, medications/allergies/relevent history reviewed, required imaging and test results available.  Performed  Maximum sterile technique was used including antiseptics, cap, gloves, gown, hand hygiene, mask and sheet. Skin prep: Chlorhexidine; local anesthetic administered A antimicrobial bonded/coated triple lumen catheter was placed in the right internal jugular vein using the Seldinger technique.  Evaluation Blood flow good Complications: No apparent complications Patient did tolerate procedure well. Chest X-ray ordered to verify placement.  CXR: pending.  Knoah Nedeau August 11, 2015, 2:32 PM

## 2015-07-16 NOTE — Code Documentation (Signed)
  Patient Name: [name]   MRN: [id number]   Date of Birth/ Sex: May 17, 1965 , male      Admission Date: 07/11/2015  Attending Provider: Orpah Cobb, MD  Primary Diagnosis: <principal problem not specified>   Indication: Pt was in his usual state of health until this PM, when he was noted to be pulseless. Code blue was subsequently called. At the time of arrival on scene, ACLS protocol was underway.   Technical Description:  - CPR performance duration:  4  minutes  - Was defibrillation or cardioversion used? No   - Was external pacer placed? Yes  - Was patient intubated pre/post CPR? Yes   Medications Administered: Y = Yes; Blank = No Amiodarone    Atropine  y  Calcium    Epinephrine  y  Lidocaine    Magnesium    Norepinephrine    Phenylephrine    Sodium bicarbonate    Vasopressin     Post CPR evaluation:  - Final Status - Was patient successfully resuscitated ? Yes - What is current rhythm? afib with RVR vs sinus tach - What is current hemodynamic status? stable  Miscellaneous Information:  - Labs sent, including: none  - Primary team notified?  Yes  - Family Notified? No  - Additional notes/ transfer status: Transfer to ICU     Jana Half, MD  07/11/2015, 1:20 PM

## 2015-07-16 NOTE — ED Notes (Signed)
Attempted to start additional IV/draw blood without success.

## 2015-07-16 NOTE — Progress Notes (Signed)
  Amiodarone Drug - Drug Interaction Consult Note  Recommendations: All current inpatient medications are safe, no current meds should interact with amiodarone. However, PTA the patient was taking digoxin, metoprolol and lipitor, if amiodarone is continued long term, will need to monitor the resulting effect, shown below.  Amiodarone is metabolized by the cytochrome P450 system and therefore has the potential to cause many drug interactions. Amiodarone has an average plasma half-life of 50 days (range 20 to 100 days).   There is potential for drug interactions to occur several weeks or months after stopping treatment and the onset of drug interactions may be slow after initiating amiodarone.   []  Statins: Increased risk of myopathy. Simvastatin- restrict dose to 20mg  daily. Other statins: counsel patients to report any muscle pain or weakness immediately.  []  Anticoagulants: Amiodarone can increase anticoagulant effect. Consider warfarin dose reduction. Patients should be monitored closely and the dose of anticoagulant altered accordingly, remembering that amiodarone levels take several weeks to stabilize.  []  Antiepileptics: Amiodarone can increase plasma concentration of phenytoin, the dose should be reduced. Note that small changes in phenytoin dose can result in large changes in levels. Monitor patient and counsel on signs of toxicity.  []  Beta blockers: increased risk of bradycardia, AV block and myocardial depression. Sotalol - avoid concomitant use.  []   Calcium channel blockers (diltiazem and verapamil): increased risk of bradycardia, AV block and myocardial depression.  []   Cyclosporine: Amiodarone increases levels of cyclosporine. Reduced dose of cyclosporine is recommended.  []  Digoxin dose should be halved when amiodarone is started.  []  Diuretics: increased risk of cardiotoxicity if hypokalemia occurs.  []  Oral hypoglycemic agents (glyburide, glipizide, glimepiride): increased  risk of hypoglycemia. Patient's glucose levels should be monitored closely when initiating amiodarone therapy.   []  Drugs that prolong the QT interval:  Torsades de pointes risk may be increased with concurrent use - avoid if possible.  Monitor QTc, also keep magnesium/potassium WNL if concurrent therapy can't be avoided. Marland Kitchen Antibiotics: e.g. fluoroquinolones, erythromycin. . Antiarrhythmics: e.g. quinidine, procainamide, disopyramide, sotalol. . Antipsychotics: e.g. phenothiazines, haloperidol.  . Lithium, tricyclic antidepressants, and methadone.  Thank You,  Baldemar Friday  07/05/2015 2:05 PM

## 2015-07-16 NOTE — Progress Notes (Signed)
CRITICAL VALUE ALERT  Critical value received:  Lactic acid 6.8  Date of notification:  2015/08/03  Time of notification:  2229  Critical value read back:Yes.    Nurse who received alert:  Cay Schillings RN  MD notified (1st page):  McQuaid  Time of first page:  2245  MD made aware. Lactic trending in the right direction. Will continue to monitor.

## 2015-07-16 NOTE — Progress Notes (Signed)
Called into the room to help Ester, RN with pt.  Pt was confused, writhing in the bed, tachypneic, diaphoretic, had been incontinent of stool.  Took 3 staff members to attempt to clean him up and get him reconnected to all the monitoring cords.  Initial thought was the pt was in DT's, but it was noticed that the pt's lips were dusky and bluish, and his sat probe wasn't picking up although attempts were made on his finger and his ear.  Pt then started to brady down into the 30's in what appeared to be PEA.  Staff felt for a pulse and called a code when none was found.  See code sheets for further documentation.  Eliane Decree, RN

## 2015-07-16 NOTE — Progress Notes (Signed)
Initial Nutrition Assessment  INTERVENTION:   Vital 1.5 @ 40 ml/hr 30 ml Prostat five times per day Provides: 1940 kcal, 139 grams protein, and 733 ml H2O  NUTRITION DIAGNOSIS:   Inadequate oral intake related to inability to eat as evidenced by NPO status.  GOAL:   Patient will meet greater than or equal to 90% of their needs  MONITOR:   Vent status, Labs, TF tolerance  REASON FOR ASSESSMENT:   Consult Enteral/tube feeding initiation and management  ASSESSMENT:   50 y/o M with PMH of NICM, HTN, AF, Anxiety, ETOH/Tobacco abuse admitted 6/15 with SOB. Found to have AF with RVR, evidence of R heart failure. Admitted by Dr. Algie Coffer, ruled out for PE. 6/15 suffered a brady to PEA arrest.   Patient is currently intubated on ventilator support MV: 10 L/min Temp (24hrs), Avg:98.1 F (36.7 C), Min:97.3 F (36.3 C), Max:99.4 F (37.4 C)  Medications reviewed and include: folvite, MVI, kayexalate, thiamine Drips: norepinephrine, milrinone Labs reviewed: K+ 6.4, trending down CBG: 137  Unable to complete Nutrition-Focused physical exam at this time.  RN working with pt.   Diet Order:    NPO  Skin:  Reviewed, no issues  Last BM:  unknown  Height:   Ht Readings from Last 1 Encounters:  2015/08/15 5\' 7"  (1.702 m)    Weight:   Wt Readings from Last 1 Encounters:  08-15-15 177 lb 0.5 oz (80.3 kg)    Ideal Body Weight:  67.2 kg  BMI:  Body mass index is 27.72 kg/(m^2).  Estimated Nutritional Needs:   Kcal:  1903  Protein:  120-140 grams  Fluid:  > 1.9 L/day  EDUCATION NEEDS:   No education needs identified at this time  Kendell Bane RD, LDN, CNSC [phone] Pager [phone] After Hours Pager

## 2015-07-16 NOTE — ED Notes (Signed)
Difficulty obtaining good reading via SpO2 monitor. Placed pt on 2L O2 via Uriah.

## 2015-07-16 NOTE — Progress Notes (Signed)
Echocardiogram 2D Echocardiogram has been performed.  [name] July 21, 2015, 5:23 PM

## 2015-07-16 NOTE — Procedures (Signed)
Arterial Catheter Insertion Procedure Note Zamir Dudzik [id number] 14-Aug-1965  Procedure: Insertion of Arterial Catheter  Indications: Blood pressure monitoring and Frequent blood sampling  Procedure Details Consent: Unable to obtain consent because of emergent medical necessity. Time Out: Verified patient identification, verified procedure, site/side was marked, verified correct patient position, special equipment/implants available, medications/allergies/relevent history reviewed, required imaging and test results available.  Performed  Maximum sterile technique was used including antiseptics, cap, gloves, gown, hand hygiene, mask and sheet. Skin prep: Chlorhexidine; local anesthetic administered 20 gauge catheter was inserted into right radial artery using the Seldinger technique.  Evaluation Blood flow good; BP tracing good. Complications: No apparent complications.   Koren Bound 07/17/2015

## 2015-07-16 NOTE — Progress Notes (Signed)
   07/19/2015 1348  Clinical Encounter Type  Visited With Family  Visit Type Code  Referral From Chaplain  Spiritual Encounters  Spiritual Needs Emotional  Stress Factors  Patient Stress Factors Health changes;Loss of control  Family Stress Factors Health changes;Lack of knowledge  Chaplain responded to code call, patient unresponsive receiving treatment, girlfriend at bedside taken out to waiting room, spiritual presence, hospitality, emotional support and prayer provided. She was advised that additional services are available as needed 24/7.

## 2015-07-16 NOTE — Procedures (Signed)
Intubation Procedure Note [name] [id number] 04/24/1965  Procedure: Intubation Indications: Respiratory insufficiency  Procedure Details Consent: Unable to obtain consent because of emergent medical necessity. Time Out: Verified patient identification, verified procedure, site/side was marked, verified correct patient position, special equipment/implants available, medications/allergies/relevent history reviewed, required imaging and test results available.  Performed  Maximum sterile technique was used including gloves, hand hygiene and mask.  MAC    Evaluation Hemodynamic Status: BP stable throughout; O2 sats: stable throughout Patient's Current Condition: stable Complications: No apparent complications Patient did tolerate procedure well. Chest X-ray ordered to verify placement.  CXR: pending.   [name] 08-06-15

## 2015-07-16 NOTE — Progress Notes (Signed)
Called back, SBP in the 50, inserted central line and a-line and started levophed.  Barely responded, started bicarb, responded to pressors then.  Will attempt milrinone to see if effective but BP is a concern.    The patient is critically ill with multiple organ systems failure and requires high complexity decision making for assessment and support, frequent evaluation and titration of therapies, application of advanced monitoring technologies and extensive interpretation of multiple databases.   Critical Care Time devoted to patient care services described in this note is  45  Minutes. This time reflects time of care of this signee Dr Koren Bound. This critical care time does not reflect procedure time, or teaching time or supervisory time of PA/NP/Med student/Med Resident etc but could involve care discussion time.  Alyson Reedy, M.D. [facility] Pulmonary/Critical Care Medicine. Pager: [phone]. After hours pager: [phone].

## 2015-07-16 NOTE — Consult Note (Signed)
PULMONARY / CRITICAL CARE MEDICINE   Name: [name] MRN: [id number] DOB: [birth year]    ADMISSION DATE:  07/27/2015 CONSULTATION DATE:  6/15  REFERRING MD:  Dr. Algie Coffer  CHIEF COMPLAINT:  Cardiac arrest  HISTORY OF PRESENT ILLNESS:   50 year old male with PMH as below, which includes heavy ETOH abuse with associated cardiomyopathy (LVEF 25-30%), COPD, HTN, Atrial fibrillation, CKD II, and MI. He had a revet TEE showing L atrial appendage thrombus for which he is on eliquis. He came to the emergency department 6/15 with complaints of progressive dyspnea, palpitations, and pleuritic chest pain. He as found to be in atrial fibrillation with RVR. He was borderline hypotensive in the ED so he was not given medications for rate control for this reason. Due to SOB and plueritic pain he was sent for CT angio chest which did not reveal PE. It did, however, demonstrate bilateral effusions and interstitial edema. He was admitted to Dr. Roseanne Kaufman service and given digoxin for AF RVR. Shortly after admission he became agitated requiring IV ativan. He then unfortunately suffered cardiac arrest preceded by short duration bradycardia and reported cyanosis by nursing staff. PEA arrest was short in duration less than 3 minutes. He was intubated and transferred to ICU for further evaluation.   PAST MEDICAL HISTORY :  He  has a past medical history of Hypertension; Alcoholism /alcohol abuse (HCC); Anxiety and depression; Tobacco abuse; Atrial fibrillation (HCC); Anxiety; Depression; Myocardial infarction (HCC); Stroke [facility]); History of hiatal hernia; Headache; CKD (chronic kidney disease), stage II; and COPD (chronic obstructive pulmonary disease) (HCC).  PAST SURGICAL HISTORY: He  has past surgical history that includes Hernia repair; TEE without cardioversion (N/A, 08/01/2014); Fracture surgery; and TEE without cardioversion (N/A, 05/15/2015).  Allergies  Allergen Reactions  . Bee Venom Anaphylaxis    Uses epi  pen-- last used 08/23/11    No current facility-administered medications on file prior to encounter.   Current Outpatient Prescriptions on File Prior to Encounter  Medication Sig  . albuterol (PROVENTIL HFA;VENTOLIN HFA) 108 (90 BASE) MCG/ACT inhaler Inhale 2 puffs into the lungs every 6 (six) hours as needed for wheezing or shortness of breath.  Marland Kitchen apixaban (ELIQUIS) 5 MG TABS tablet Take 1 tablet (5 mg total) by mouth 2 (two) times daily.  Marland Kitchen atorvastatin (LIPITOR) 40 MG tablet Take 1 tablet (40 mg total) by mouth daily at 6 PM.  . digoxin (LANOXIN) 0.25 MG tablet Take 1 tablet (0.25 mg total) by mouth daily.  . furosemide (LASIX) 20 MG tablet Take 2 tablets (40 mg total) by mouth daily.  Marland Kitchen ibuprofen (ADVIL,MOTRIN) 200 MG tablet Take 200 mg by mouth every 6 (six) hours as needed for moderate pain.  . metoprolol (LOPRESSOR) 50 MG tablet Take 1.5 tablets (75 mg total) by mouth 2 (two) times daily.  Marland Kitchen oxyCODONE-acetaminophen (PERCOCET/ROXICET) 5-325 MG tablet Take 2 tablets by mouth every 6 (six) hours as needed for severe pain.  . potassium chloride SA (K-DUR,KLOR-CON) 20 MEQ tablet Take 1 tablet (20 mEq total) by mouth daily.  . sacubitril-valsartan (ENTRESTO) 24-26 MG Take 1 tablet by mouth 2 (two) times daily.    FAMILY HISTORY:  His reported the following about his mother: estranged, does not know. He reported the following about his father: estranged, does not know.   SOCIAL HISTORY: He  reports that he quit smoking about 14 months ago. His smoking use included Cigarettes. He has a 30 pack-year smoking history. He has never used smokeless tobacco. He reports  that he drinks about 54.0 oz of alcohol per week. He reports that he does not use illicit drugs.  REVIEW OF SYSTEMS:   unable  SUBJECTIVE:   VITAL SIGNS: BP 88/67 mmHg  Pulse 132  Temp(Src) 99.4 F (37.4 C) (Oral)  Resp 21  Ht 5\' 7"  (1.702 m)  Wt 80.3 kg (177 lb 0.5 oz)  BMI 27.72 kg/m2  SpO2 100%  HEMODYNAMICS:     VENTILATOR SETTINGS:    INTAKE / OUTPUT: I/O last 3 completed shifts: In: 3 [I.V.:3] Out: 1 [Stool:1]  PHYSICAL EXAMINATION: General:  Male of normal body habitus in NAD Neuro:  Comatose immediately post cardiac arrest HEENT:  [place]/AT, PERRL Cardiovascular: IRIR tachy Lungs:  Coarse crackles L>R Abdomen:  Soft, non-distended Musculoskeletal:  No acute deformity or ROM limitation Skin:  Grossly intact  LABS:  BMET  Recent Labs Lab 08/05/15 0043 2015/08/05 0116 05-Aug-2015 1047  NA 136 137 139  K 4.2 6.9* 6.4*  CL 106 106 110  CO2 18*  --  15*  BUN 14 23* 18  CREATININE 1.48* 1.40* 1.72*  GLUCOSE 107* 97 103*    Electrolytes  Recent Labs Lab 05-Aug-2015 0043 2015/08/05 1047  CALCIUM 9.8 9.0    CBC  Recent Labs Lab 08-05-15 0043 08/05/2015 0116  WBC 6.7  --   HGB 16.5 18.0*  HCT 49.1 53.0*  PLT 93*  --     Coag's  Recent Labs Lab 08-05-15 1047  INR 2.78*    Sepsis Markers No results for input(s): LATICACIDVEN, PROCALCITON, O2SATVEN in the last 168 hours.  ABG No results for input(s): PHART, PCO2ART, PO2ART in the last 168 hours.  Liver Enzymes  Recent Labs Lab 08/05/15 0043  AST 56*  ALT 34  ALKPHOS 74  BILITOT 1.6*  ALBUMIN 4.1    Cardiac Enzymes  Recent Labs Lab 08/05/2015 0425 Aug 05, 2015 1047  TROPONINI 0.04* 0.06*    Glucose No results for input(s): GLUCAP in the last 168 hours.  Imaging Ct Angio Chest Pe W/cm &/or Wo Cm  Aug 05, 2015  CLINICAL DATA:  Sudden onset left-sided abdominal pain, shortness of breath, left arm and left leg numbness for 15 minutes. EXAM: CT ANGIOGRAPHY CHEST CT ABDOMEN AND PELVIS WITH CONTRAST TECHNIQUE: Multidetector CT imaging of the chest was performed using the standard protocol during bolus administration of intravenous contrast. Multiplanar CT image reconstructions and MIPs were obtained to evaluate the vascular anatomy. Multidetector CT imaging of the abdomen and pelvis was performed using the standard  protocol during bolus administration of intravenous contrast. CONTRAST:  100 mL Isovue 370 COMPARISON:  CT abdomen and pelvis 06/10/2015 FINDINGS: CTA CHEST FINDINGS Technically adequate study with good opacification of the central and segmental pulmonary arteries. No focal filling defects demonstrated. No evidence of significant pulmonary embolus. Cardiac enlargement with reflux of contrast material into the inferior vena cava and hepatic veins consistent with right heart failure. Normal caliber thoracic aorta. Calcified lymph nodes in the mediastinum consistent with old granulomatous infection. No significant lymphadenopathy in the chest. Esophagus is decompressed. Evaluation of lungs is limited due to motion artifact. There is a small right pleural effusion with fluid in the left major fissure. Interstitial changes likely represent mild edema. There are some patchy focal areas of infiltration in the mid lungs bilaterally possibly representing pneumonia. No pneumothorax. Airways are patent. CT ABDOMEN and PELVIS FINDINGS Examination is technically limited due to motion artifact. Passive hepatic congestion. No focal liver lesions identified. Gallbladder demonstrates diffusely thickened wall, nonspecific but possibly indicating  cholecystitis. No stones identified. No bile duct dilatation. The pancreas, spleen, adrenal glands, kidneys, abdominal aorta, inferior vena cava, and retroperitoneal lymph nodes are unremarkable. Stomach, small bowel, and colon are decompressed. No free air or free fluid in the abdomen. Pelvis: Bladder is decompressed. Prostate gland is not enlarged. No free or loculated pelvic fluid collections. No pelvic mass or lymphadenopathy. Appendix is normal. No evidence of diverticulitis. Mobile testes are noted in the lower inguinal canals bilaterally. Degenerative changes in the spine. No destructive bone lesions. Review of the MIP images confirms the above findings. IMPRESSION: No evidence of  significant pulmonary embolus. Changes of right heart failure with passive hepatic congestion. Small bilateral pleural effusions. Mild interstitial edema. Focal patchy areas of airspace disease in both lungs may represent superimposed pneumonia. Gallbladder wall is thickened. This may indicate cholecystitis. Electronically Signed   By: Burman Nieves M.D.   On: 07/27/2015 03:29   Ct Abdomen Pelvis W Contrast  2015-07-27  CLINICAL DATA:  Sudden onset left-sided abdominal pain, shortness of breath, left arm and left leg numbness for 15 minutes. EXAM: CT ANGIOGRAPHY CHEST CT ABDOMEN AND PELVIS WITH CONTRAST TECHNIQUE: Multidetector CT imaging of the chest was performed using the standard protocol during bolus administration of intravenous contrast. Multiplanar CT image reconstructions and MIPs were obtained to evaluate the vascular anatomy. Multidetector CT imaging of the abdomen and pelvis was performed using the standard protocol during bolus administration of intravenous contrast. CONTRAST:  100 mL Isovue 370 COMPARISON:  CT abdomen and pelvis 06/10/2015 FINDINGS: CTA CHEST FINDINGS Technically adequate study with good opacification of the central and segmental pulmonary arteries. No focal filling defects demonstrated. No evidence of significant pulmonary embolus. Cardiac enlargement with reflux of contrast material into the inferior vena cava and hepatic veins consistent with right heart failure. Normal caliber thoracic aorta. Calcified lymph nodes in the mediastinum consistent with old granulomatous infection. No significant lymphadenopathy in the chest. Esophagus is decompressed. Evaluation of lungs is limited due to motion artifact. There is a small right pleural effusion with fluid in the left major fissure. Interstitial changes likely represent mild edema. There are some patchy focal areas of infiltration in the mid lungs bilaterally possibly representing pneumonia. No pneumothorax. Airways are patent. CT  ABDOMEN and PELVIS FINDINGS Examination is technically limited due to motion artifact. Passive hepatic congestion. No focal liver lesions identified. Gallbladder demonstrates diffusely thickened wall, nonspecific but possibly indicating cholecystitis. No stones identified. No bile duct dilatation. The pancreas, spleen, adrenal glands, kidneys, abdominal aorta, inferior vena cava, and retroperitoneal lymph nodes are unremarkable. Stomach, small bowel, and colon are decompressed. No free air or free fluid in the abdomen. Pelvis: Bladder is decompressed. Prostate gland is not enlarged. No free or loculated pelvic fluid collections. No pelvic mass or lymphadenopathy. Appendix is normal. No evidence of diverticulitis. Mobile testes are noted in the lower inguinal canals bilaterally. Degenerative changes in the spine. No destructive bone lesions. Review of the MIP images confirms the above findings. IMPRESSION: No evidence of significant pulmonary embolus. Changes of right heart failure with passive hepatic congestion. Small bilateral pleural effusions. Mild interstitial edema. Focal patchy areas of airspace disease in both lungs may represent superimposed pneumonia. Gallbladder wall is thickened. This may indicate cholecystitis. Electronically Signed   By: Burman Nieves M.D.   On: 07-27-2015 03:29   Dg Chest Port 1 View  07/27/15  CLINICAL DATA:  Acute onset of shortness of breath. Atrial fibrillation. Initial encounter. EXAM: PORTABLE CHEST 1 VIEW COMPARISON:  Chest  radiograph from 06/09/2015 FINDINGS: The lungs are well-aerated. Mild bibasilar opacities may reflect atelectasis or mild interstitial edema. There is no evidence of pleural effusion or pneumothorax. The cardiomediastinal silhouette is mildly enlarged. No acute osseous abnormalities are seen. IMPRESSION: Mild bibasilar opacities may reflect atelectasis or mild interstitial edema. Mild cardiomegaly. Electronically Signed   By: Roanna Raider M.D.    On: 2015/07/22 01:52     STUDIES:  CT chest/abdomen 6/15 > No evidence of significant pulmonary embolus. Changes of right heart failure with passive hepatic congestion. Small bilateral pleural effusions. Mild interstitial edema. Focal patchy areas of airspace disease in both lungs may represent superimposed pneumonia. Gallbladder wall is thickened. This may indicate cholecystitis.  CULTURES:   ANTIBIOTICS:   SIGNIFICANT EVENTS: 6/15 admit with AF RVR, cardiac arrest  LINES/TUBES:   DISCUSSION: 50 y/o M with PMH of NICM, HTN, AF, Anxiety, ETOH/Tobacco abuse admitted 6/15 with SOB. Found to have AF with RVR, evidence of R heart failure. Admitted by Dr. Algie Coffer, ruled out for PE. 6/15 suffered a brady to PEA arrest.   ASSESSMENT / PLAN:  PULMONARY A: Acute Hypoxic Respiratory Failure - suspect respiratory leading to cardiac arrest Rule out CAP - concern for RLL airspace disease on CTA Tobacco Abuse  COPD - without acute exacerbation.  P:  PRVC 8 cc/kg Wean PEEP / FiO2 for sats > 92% PRN albuterol given smoking hx CXR and ABG post intubation  See ID   CARDIOVASCULAR A:  Bradycardic to PEA Arrest - suspect component of respiratory Atrial Fibrillation with RVR Moderate to Severe MR/TR P:  Continue amiodarone gtt Transfer to ICU for monitoring  Trend troponin, EKG, BNP Cardiology following, defer further work up to Dr. Algie Coffer MAP goal > 65, Levophed infusion for MAP goal  RENAL A:  AKI on Chronic Disease Hyperkalemia  P:  Trend BMP / UOP  Replace electrolytes as indicated  Kayexalate per OGT Assess lactic acid   GASTROINTESTINAL A:  Congestive Hepatopathy  Obesity  Rectal Bleeding  P:  NPO  Place OGT  Begin TF per Nutrition  Pepcid for SUP   HEMATOLOGIC A:  Thrombocytopenia - suspect secondary to ETOH abuse P:  Trend CBC Hold anticoagulation with rectal bleeding   INFECTIOUS A:  Possible CAP - RLL  P:   ABX as above  Cultures as above   ENDOCRINE A:  At Risk Hyper / Hypoglycemia - in setting of ETOH abuse / possible hepatic dysfunction P:  Monitor glucose  NEUROLOGIC A:  Acute Encephalopathy - post arrest ETOH Abuse  Anxiety / Depression  Hx TIA P:  RASS goal: 0 to -1 Precedex for sedation  Fentanyl for pain    Joneen Roach, AGACNP-BC [place] Pulmonology/Critical Care Pager [phone] or [phone]  Attending Note:  50 year old male with etoh abuse and associated dilated cardiomyopathy with an EF of 25% who presents with SOB.  Patient was also in a-fib with RVR with unstable BP.  Patient was given 2L of IVF in ED and 2L in the SDU then developed respiratory distress and bradycardic cardiac arrest.  Patient was intubated and PCCM was called to assist with vent management.  Patient was also started on CAP coverage.  On exam, diffuse crackles, unresponsive.  I reviewed chest CT myself, some minimal GGO, right sided pleural effusion and mediastinal LAN that are calcified.  Will transfer to the ICU, place on full vent support, check ABG and adjust vent, titrate O2 down as able, continue roc/zithro, f/u on culture, thiamine/folate,  precedex and fentanyl for withdrawal and sedation.    The patient is critically ill with multiple organ systems failure and requires high complexity decision making for assessment and support, frequent evaluation and titration of therapies, application of advanced monitoring technologies and extensive interpretation of multiple databases.   Critical Care Time devoted to patient care services described in this note is  45  Minutes. This time reflects time of care of this signee Dr Koren Bound. This critical care time does not reflect procedure time, or teaching time or supervisory time of PA/NP/Med student/Med Resident etc but could involve care discussion time.  Alyson Reedy, M.D. [facility] Pulmonary/Critical Care Medicine. Pager:  [phone]. After hours pager: [phone].  07/30/2015 2:03 PM

## 2015-07-16 NOTE — Care Management Note (Signed)
Case Management Note  Patient Details  Name: Deja Iturralde MRN: [id number] Date of Birth: [birth year]  Subjective/Objective:    Pt lives with his uncle and states he now has Medicaid, just received his card but did not have it when he was admitted.  TC to admtting to alert them.  Discussed home health RN for heart failure management and education - pt agrees.  Provided list of home health agencies and referral made to [facility] per choice.                        Expected Discharge Plan:  Home w Home Health Services  Discharge planning Services  CM Consult  Post Acute Care Choice:  Home Health Choice offered to:  Patient  HH Arranged:  RN, Disease Management HH Agency:  [facility]  Status of Service:  In process, will continue to follow  Magdalene River, RN 07/21/2015, 10:53 AM

## 2015-07-16 NOTE — ED Notes (Signed)
Pt reports I just had two panic attacks.

## 2015-07-16 NOTE — ED Notes (Signed)
Reports sudden onset of L sided abd pain, sob, L arm and L leg numbness x 15 min.  Speech clear. No arm drift. No leg drift.  Denies chest pain.

## 2015-07-16 NOTE — Progress Notes (Signed)
Pt continues to be hypotensive, dropping at times into the 60's systolic.  Dr Molli Knock still on the unit, gave verbal order to give 2 more amps of bicarb now and give a 3rd if he drops again before the bicarb gtt comes up from pharmacy.  2amps given with pressures returning to the 80's systolic.  Pt still on levo at 50 mcg/min. Eliane Decree, RN

## 2015-07-16 NOTE — H&P (Signed)
Referring Physician:  [name] is an 50 y.o. male.                       Chief Complaint: Shortness of breath and palpitation  HPI: 50 year old male with past medical history significant for nonischemic dilated cardiomyopathy, hypertension, history of A. fib with RVR in the past CHA2DS2VASc score of 4, anxiety disorder, history of TIA, EtOH abuse, tobacco abuse, noncompliant to medication and follow-up, came to the ER complaining of progressive increasing shortness of breath for last for 7 days associated with pleuritic chest pain and palpitations. Patient was noted to be in A. fib with RVR. Chest x-ray showed cardiomegaly and mild interstitial edema. CT angio chest showed changes of right heart failure, possible pneumonia and no evidence of significant pulmonary embolus.  Past Medical History  Diagnosis Date  . Hypertension   . Alcoholism /alcohol abuse ([place])   . Anxiety and depression   . Tobacco abuse   . Atrial fibrillation ([place])   . Anxiety   . Depression   . Myocardial infarction ([place])   . Stroke ([place])   . History of hiatal hernia   . Headache   . CKD (chronic kidney disease), stage II   . COPD (chronic obstructive pulmonary disease) [facility])       Past Surgical History  Procedure Laterality Date  . Hernia repair      UHR x2  . Tee without cardioversion N/A 08/01/2014    Procedure: TRANSESOPHAGEAL ECHOCARDIOGRAM (TEE);  Surgeon: Dixie Dials, MD;  Location: [facility] ENDOSCOPY;  Service: Cardiovascular;  Laterality: N/A;  . Fracture surgery      right FA  . Tee without cardioversion N/A 05/15/2015    Procedure: TRANSESOPHAGEAL ECHOCARDIOGRAM (TEE);  Surgeon: Dixie Dials, MD;  Location: [facility] ENDOSCOPY;  Service: Cardiovascular;  Laterality: N/A;    Family History  Problem Relation Age of Onset  . Heart attack Mother   . Heart attack Father   . Hypertension Mother   . Hypertension Father    Social History:  reports that he quit smoking about 14 months ago. His smoking use  included Cigarettes. He has a 30 pack-year smoking history. He has never used smokeless tobacco. He reports that he drinks alcohol. He reports that he does not use illicit drugs.  Allergies:  Allergies  Allergen Reactions  . Bee Venom Anaphylaxis    Uses epi pen-- last used 08/23/11     (Not in a hospital admission)  Results for orders placed or performed during the hospital encounter of 07/27/2015 (from the past 48 hour(s))  Lipase, blood     Status: None   Collection Time: 07/21/2015 12:43 AM  Result Value Ref Range   Lipase 22 11 - 51 U/L  Comprehensive metabolic panel     Status: Abnormal   Collection Time: 07/07/2015 12:43 AM  Result Value Ref Range   Sodium 136 135 - 145 mmol/L   Potassium 4.2 3.5 - 5.1 mmol/L   Chloride 106 101 - 111 mmol/L   CO2 18 (L) 22 - 32 mmol/L   Glucose, Bld 107 (H) 65 - 99 mg/dL   BUN 14 6 - 20 mg/dL   Creatinine, Ser 1.48 (H) 0.61 - 1.24 mg/dL   Calcium 9.8 8.9 - 10.3 mg/dL   Total Protein 7.4 6.5 - 8.1 g/dL   Albumin 4.1 3.5 - 5.0 g/dL   AST 56 (H) 15 - 41 U/L   ALT 34 17 - 63 U/L   Alkaline  Phosphatase 74 38 - 126 U/L   Total Bilirubin 1.6 (H) 0.3 - 1.2 mg/dL   GFR calc non Af Amer 53 (L) >60 mL/min   GFR calc Af Amer >60 >60 mL/min    Comment: (NOTE) The eGFR has been calculated using the CKD EPI equation. This calculation has not been validated in all clinical situations. eGFR's persistently <60 mL/min signify possible Chronic Kidney Disease.    Anion gap 12 5 - 15  CBC     Status: Abnormal   Collection Time: 07/19/2015 12:43 AM  Result Value Ref Range   WBC 6.7 4.0 - 10.5 K/uL   RBC 5.09 4.22 - 5.81 MIL/uL   Hemoglobin 16.5 13.0 - 17.0 g/dL   HCT 49.1 39.0 - 52.0 %   MCV 96.5 78.0 - 100.0 fL   MCH 32.4 26.0 - 34.0 pg   MCHC 33.6 30.0 - 36.0 g/dL   RDW 13.7 11.5 - 15.5 %   Platelets 93 (L) 150 - 400 K/uL    Comment: PLATELET COUNT CONFIRMED BY SMEAR  I-stat troponin, ED     Status: None   Collection Time: 07/30/2015 12:48 AM  Result  Value Ref Range   Troponin i, poc 0.04 0.00 - 0.08 ng/mL   Comment 3            Comment: Due to the release kinetics of cTnI, a negative result within the first hours of the onset of symptoms does not rule out myocardial infarction with certainty. If myocardial infarction is still suspected, repeat the test at appropriate intervals.   I-stat chem 8, ed     Status: Abnormal   Collection Time: 07/12/2015  1:16 AM  Result Value Ref Range   Sodium 137 135 - 145 mmol/L   Potassium 6.9 (HH) 3.5 - 5.1 mmol/L   Chloride 106 101 - 111 mmol/L   BUN 23 (H) 6 - 20 mg/dL   Creatinine, Ser 1.40 (H) 0.61 - 1.24 mg/dL   Glucose, Bld 97 65 - 99 mg/dL   Calcium, Ion 1.04 (L) 1.12 - 1.23 mmol/L   TCO2 21 0 - 100 mmol/L   Hemoglobin 18.0 (H) 13.0 - 17.0 g/dL   HCT 53.0 (H) 39.0 - 52.0 %   Comment NOTIFIED PHYSICIAN    Dg Chest Port 1 View  07/07/2015  CLINICAL DATA:  Acute onset of shortness of breath. Atrial fibrillation. Initial encounter. EXAM: PORTABLE CHEST 1 VIEW COMPARISON:  Chest radiograph from 06/09/2015 FINDINGS: The lungs are well-aerated. Mild bibasilar opacities may reflect atelectasis or mild interstitial edema. There is no evidence of pleural effusion or pneumothorax. The cardiomediastinal silhouette is mildly enlarged. No acute osseous abnormalities are seen. IMPRESSION: Mild bibasilar opacities may reflect atelectasis or mild interstitial edema. Mild cardiomegaly. Electronically Signed   By: Garald Balding M.D.   On: 07/21/2015 01:52    Review Of Systems General: no fevers, chills, no changes in body weight, has poor appetite, has fatigue HEENT: no blurry vision, hearing changes or sore throat Pulm: has dyspnea, coughing, wheezing CV: has chest pain and palpitations Abd: no nausea, vomiting but has occasional abdominal pain, no diarrhea, constipation GU: no dysuria, burning on urination, increased urinary frequency, hematuria  Ext: no leg edema Neuro: no unilateral weakness,  numbness, or tingling, no vision change or hearing loss Skin: no rash MSK: No muscle spasm, no deformity, no limitation of range of movement in spin Heme: No easy bruising.  Travel history: No recent long distant travel.Constitutional: Negative for fever and  chills.   Blood pressure 132/83, pulse 70, temperature 97.7 F (36.5 C), temperature source Oral, resp. rate 38, height _0  (1.702 m), weight 82.101 kg (181 lb), SpO2 89 %. General: In acute respiratory distress HEENT:  Eyes: PERRL, EOMI, no scleral icterus.  ENT: No discharge from the ears and nose, no pharynx injection, no tonsillar enlargement.   Neck: positive JVD, no bruit, no mass felt. Heme: No neck lymph node enlargement. Cardiac: S1/S2, irregularly irregular rhythm, soft 3/6 systolic murmurs, No gallops or rubs. Pulm: has fine crackles and mild wheezing bilaterally. No rubs. Abd: Soft, nondistended, tenderness over periumbilical area, no rebound pain, no organomegaly, BS present.  GU: No hematuria Ext: No pitting leg edema bilaterally. 2+DP/PT pulse bilaterally. Musculoskeletal: No joint deformities, No joint redness or warmth. Skin: No rashes.  Neuro: Alert, oriented X 3, cranial nerves II-XII grossly intact, moves all extremities normally. Psych: Anxiety/Panicky. No suicidal or hemocidal ideation.  Assessment/Plan Acute on chronic left heart systolic failure. Atrial fibrillation with RVR, chronic with CHA2DS2VASc score of 4 Chronic right heart systolic failure Possible pneumonia ETOH abuse Tobacco use disorder Anxiety/Panic attack Moderate to severe MR and TR Abdominal pain CKD, II  Admit/IV antibiotic/Oxygen/Nebulizer treatment/Lasix as tolerated.  Birdie Riddle, MD  07/04/2015, 3:26 AM

## 2015-07-16 NOTE — ED Provider Notes (Addendum)
Patient presented to the ER with shortness of breath and abdominal pain. Patient reports that he has been experiencing epigastric abdominal pain ongoing for several days or more. It has worsened over time. He has developed sudden onset of shortness of breath this evening. He thinks he might be having a panic attack.  Face to face Exam: HEENT - PERRLA Lungs - CTAB, tachypneic Heart - irregularly irregular, tachycardia, no M/R/G Abd - nondistended, epigastric tenderness, no peritonitis, no pulsatile mass Neuro - alert, oriented x3  Plan: Patient with a history of paroxysmal atrial for ablation presents with abdominal pain and shortness of breath. He is found to be in atrial fibrillation with rapid ventricular response. Patient is borderline hypotensive, might not tolerate rate control at this time. Will initiate fluid bolus first. Reviewing records reveal CT scan from one month ago that did not show any evidence of AAA.  Patient experiencing severe shortness of breath and tachypnea, but lungs are clear. Might be secondary to anxiety, but will have to consider possibility of PE. Reassuring that the patient is on Eliquis.  CT PE study does not show evidence of PE. Patient has bilateral pleural effusions and interstitial edema. CT abdomen and pelvis reveals gallbladder wall thickening. This was seen on previous CT one month ago.  Patient mildly unstable. Considered electrical cardioversion, however, reviewing his records reveals she in April that showed atrial appendage thrombus. He is therefore not a candidate for cardioversion. Consulted Dr. Algie Coffer who will admit the patient.  CRITICAL CARE Performed by: Gilda Crease   Total critical care time: 30 minutes  Critical care time was exclusive of separately billable procedures and treating other patients.  Critical care was necessary to treat or prevent imminent or life-threatening deterioration.  Critical care was time spent  personally by me on the following activities: development of treatment plan with patient and/or surrogate as well as nursing, discussions with consultants, evaluation of patient's response to treatment, examination of patient, obtaining history from patient or surrogate, ordering and performing treatments and interventions, ordering and review of laboratory studies, ordering and review of radiographic studies, pulse oximetry and re-evaluation of patient's condition.     Gilda Crease, MD Aug 09, 2015 [id number]  Gilda Crease, MD 08-09-2015 [phone]

## 2015-07-16 NOTE — Progress Notes (Signed)
Ref: [name],Robert H, NP   Subjective:  Intubated during code blue with 4 minutes of CPR, atropine 1mg  and epinephrine 1: 10,000 x one. Also has rectal bleed.  Objective:  Vital Signs in the last 24 hours: Temp:  [97.7 F (36.5 C)-99.4 F (37.4 C)] 99.4 F (37.4 C) (06/15 0752) Pulse Rate:  [35-146] 132 (06/15 1223) Cardiac Rhythm:  [-] Atrial fibrillation (06/15 0700) Resp:  [17-43] 21 (06/15 0752) BP: (81-157)/(52-120) 88/67 mmHg (06/15 1223) SpO2:  [82 %-100 %] 100 % (06/15 0752) Weight:  [80.3 kg (177 lb 0.5 oz)-82.101 kg (181 lb)] 80.3 kg (177 lb 0.5 oz) (06/15 0700)  Physical Exam: BP Readings from Last 1 Encounters:  07/27/2015 88/67    Wt Readings from Last 1 Encounters:  07/07/2015 80.3 kg (177 lb 0.5 oz)    Weight change:   HEENT: [place]/AT, Eyes- Conjunctiva-Pale pink, Sclera-Non-icteric. Intubated. Neck: + JVD, No bruit, Trachea midline. Lungs:  Coarse crackles, Bilateral. Cardiac: Irregular rhythm, tachycardic. normal S1 and S2, no S3.  Abdomen:  Soft, non-tender. Extremities:  No edema present. No cyanosis. No clubbing. CNS: AxOx0, Right handed. Skin: Warm and dry.   Intake/Output from previous day: 06/14 0701 - 06/15 0700 In: 3 [I.V.:3] Out: 1 [Stool:1]    Lab Results: BMET    Component Value Date/Time   NA 139 07/27/2015 1047   NA 137 07/12/2015 0116   NA 136 07/25/2015 0043   K 6.4* 07/15/2015 1047   K 6.9* 07/12/2015 0116   K 4.2 07/14/2015 0043   CL 110 07/25/2015 1047   CL 106 07/10/2015 0116   CL 106 07/28/2015 0043   CO2 15* 07/17/2015 1047   CO2 18* 07/21/2015 0043   CO2 29 06/13/2015 0545   GLUCOSE 103* 07/20/2015 1047   GLUCOSE 97 07/15/2015 0116   GLUCOSE 107* 07/05/2015 0043   BUN 18 07/06/2015 1047   BUN 23* 07/02/2015 0116   BUN 14 07/21/2015 0043   CREATININE 1.72* 07/20/2015 1047   CREATININE 1.40* 07/24/2015 0116   CREATININE 1.48* 07/29/2015 0043   CALCIUM 9.0 07/15/2015 1047   CALCIUM 9.8 07/07/2015 0043   CALCIUM 9.4  06/13/2015 0545   GFRNONAA 45* 07/24/2015 1047   GFRNONAA 53* 07/29/2015 0043   GFRNONAA >60 06/13/2015 0545   GFRAA 52* 07/17/2015 1047   GFRAA >60 07/06/2015 0043   GFRAA >60 06/13/2015 0545   CBC    Component Value Date/Time   WBC 6.7 07/28/2015 0043   RBC 5.09 07/23/2015 0043   HGB 18.0* 07/04/2015 0116   HCT 53.0* 07/22/2015 0116   HCT 36.5* 10/20/2014 0210   PLT 93* 07/21/2015 0043   MCV 96.5 07/06/2015 0043   MCH 32.4 07/17/2015 0043   MCHC 33.6 07/31/2015 0043   RDW 13.7 07/09/2015 0043   LYMPHSABS 0.5* 06/11/2015 0228   MONOABS 1.0 06/11/2015 0228   EOSABS 0.0 06/11/2015 0228   BASOSABS 0.0 06/11/2015 0228   HEPATIC Function Panel  Recent Labs  06/09/15 2023 06/11/15 0228 07/22/2015 0043  PROT 6.0* 5.7* 7.4   HEMOGLOBIN A1C No components found for: HGA1C,  MPG CARDIAC ENZYMES Lab Results  Component Value Date   CKTOTAL 129 09/13/2009   CKMB 2.4 09/13/2009   TROPONINI 0.06* 07/09/2015   TROPONINI 0.04* 07/12/2015   TROPONINI 0.03 06/10/2015   BNP No results for input(s): PROBNP in the last 8760 hours. TSH  Recent Labs  07/30/14 2321 10/18/14 1904 05/14/15 1446  TSH 1.012 0.774 1.970   CHOLESTEROL  Recent Labs  10/19/14  1610 05/15/15 0207 06/10/15 0234  CHOL 202* 137 134    Scheduled Meds: . azithromycin  500 mg Intravenous Q24H  . cefTRIAXone (ROCEPHIN)  IV  1 g Intravenous Q24H  . sodium chloride flush  3 mL Intravenous Q12H   Continuous Infusions: . amiodarone    . amiodarone     PRN Meds:.sodium chloride, albuterol, ondansetron (ZOFRAN) IV, sodium chloride flush  Assessment/Plan: Acute respiratory failure with pulmonary edema Acute on chronic left heart systolic failure. Atrial fibrillation with RVR, chronic with CHA2DS2VASc score of 4 Chronic right heart systolic failure Possible pneumonia ETOH abuse Tobacco use disorder Anxiety/Panic attack Moderate to severe MR and TR Abdominal pain CKD, II  Acute GI  bleed  Transfer to ICU. Portable CXR. IV amiodarone Stop Eliquis Kayexalate post OG tube insertion Repeat labs. Appreciate Code team and CCM.   LOS: 0 days    Orpah Cobb  MD  2015-07-24, 1:36 PM

## 2015-07-16 NOTE — Procedures (Signed)
CPR Note  PEA cardiac arrest, <5 minutes, ROSC established, please see code sheet.  Alyson Reedy, M.D. [facility] Pulmonary/Critical Care Medicine. Pager: [phone]. After hours pager: [phone].

## 2015-07-17 ENCOUNTER — Inpatient Hospital Stay (HOSPITAL_COMMUNITY): Payer: Medicaid Other

## 2015-07-17 DIAGNOSIS — R570 Cardiogenic shock: Secondary | ICD-10-CM

## 2015-07-17 DIAGNOSIS — I5023 Acute on chronic systolic (congestive) heart failure: Secondary | ICD-10-CM

## 2015-07-17 LAB — CBC
HCT: 42.9 % (ref 39.0–52.0)
HEMATOCRIT: 51.6 % (ref 39.0–52.0)
HEMOGLOBIN: 14.8 g/dL (ref 13.0–17.0)
HEMOGLOBIN: 17.1 g/dL — AB (ref 13.0–17.0)
MCH: 32 pg (ref 26.0–34.0)
MCH: 32.2 pg (ref 26.0–34.0)
MCHC: 34.5 g/dL (ref 30.0–36.0)
MCHC: 33.1 g/dL (ref 30.0–36.0)
MCV: 92.7 fL (ref 78.0–100.0)
MCV: 97.2 fL (ref 78.0–100.0)
PLATELETS: 92 10*3/uL — AB (ref 150–400)
Platelets: 64 10*3/uL — ABNORMAL LOW (ref 150–400)
RBC: 4.63 MIL/uL (ref 4.22–5.81)
RBC: 5.31 MIL/uL (ref 4.22–5.81)
RDW: 13.4 % (ref 11.5–15.5)
RDW: 13.9 % (ref 11.5–15.5)
WBC: 5.1 10*3/uL (ref 4.0–10.5)
WBC: 3.2 10*3/uL — AB (ref 4.0–10.5)

## 2015-07-17 LAB — BASIC METABOLIC PANEL
ANION GAP: 12 (ref 5–15)
ANION GAP: 11 (ref 5–15)
BUN: 20 mg/dL (ref 6–20)
BUN: 24 mg/dL — ABNORMAL HIGH (ref 6–20)
CALCIUM: 7.9 mg/dL — AB (ref 8.9–10.3)
CHLORIDE: 107 mmol/L (ref 101–111)
CO2: 24 mmol/L (ref 22–32)
CO2: 21 mmol/L — ABNORMAL LOW (ref 22–32)
Calcium: 7.3 mg/dL — ABNORMAL LOW (ref 8.9–10.3)
Chloride: 105 mmol/L (ref 101–111)
Creatinine, Ser: 1.81 mg/dL — ABNORMAL HIGH (ref 0.61–1.24)
Creatinine, Ser: 2.47 mg/dL — ABNORMAL HIGH (ref 0.61–1.24)
GFR calc Af Amer: 49 mL/min — ABNORMAL LOW (ref >60)
GFR calc non Af Amer: 29 mL/min — ABNORMAL LOW (ref >60)
GFR, EST AFRICAN AMERICAN: 33 mL/min — AB (ref >60)
GFR, EST NON AFRICAN AMERICAN: 42 mL/min — AB (ref >60)
GLUCOSE: 123 mg/dL — AB (ref 65–99)
Glucose, Bld: 93 mg/dL (ref 65–99)
POTASSIUM: 4.6 mmol/L (ref 3.5–5.1)
Potassium: 3.1 mmol/L — ABNORMAL LOW (ref 3.5–5.1)
SODIUM: 139 mmol/L (ref 135–145)
Sodium: 141 mmol/L (ref 135–145)

## 2015-07-17 LAB — LACTIC ACID, PLASMA: Lactic Acid, Venous: 5 mmol/L (ref 0.5–2.0)

## 2015-07-17 LAB — POCT I-STAT 3, ART BLOOD GAS (G3+)
ACID-BASE DEFICIT: 9 mmol/L — AB (ref 0.0–2.0)
BICARBONATE: 20.5 meq/L (ref 20.0–24.0)
O2 SAT: 62 %
PCO2 ART: 54.6 mmHg — AB (ref 35.0–45.0)
PH ART: 7.183 — AB (ref 7.350–7.450)
PO2 ART: 40 mmHg — AB (ref 80.0–100.0)
Patient temperature: 98.7
TCO2: 22 mmol/L (ref 0–100)

## 2015-07-17 LAB — BLOOD GAS, ARTERIAL
Acid-Base Excess: 2.3 mmol/L — ABNORMAL HIGH (ref 0.0–2.0)
BICARBONATE: 24.1 meq/L — AB (ref 20.0–24.0)
Drawn by: 29017
FIO2: 0.4
LHR: 35 {breaths}/min
O2 Saturation: 97 %
PEEP: 5 cmH2O
Patient temperature: 98.7
TCO2: 24.9 mmol/L (ref 0–100)
VT: 530 mL
pCO2 arterial: 24.3 mmHg — ABNORMAL LOW (ref 35.0–45.0)
pH, Arterial: 7.602 (ref 7.350–7.450)
pO2, Arterial: 85.5 mmHg (ref 80.0–100.0)

## 2015-07-17 LAB — HEPATIC FUNCTION PANEL
ALBUMIN: 2.5 g/dL — AB (ref 3.5–5.0)
ALK PHOS: 103 U/L (ref 38–126)
ALT: 2107 U/L — ABNORMAL HIGH (ref 17–63)
AST: 3384 U/L — ABNORMAL HIGH (ref 15–41)
BILIRUBIN INDIRECT: 0.8 mg/dL (ref 0.3–0.9)
BILIRUBIN TOTAL: 1.6 mg/dL — AB (ref 0.3–1.2)
Bilirubin, Direct: 0.8 mg/dL — ABNORMAL HIGH (ref 0.1–0.5)
Total Protein: 4.6 g/dL — ABNORMAL LOW (ref 6.5–8.1)

## 2015-07-17 LAB — PHOSPHORUS
PHOSPHORUS: 2.9 mg/dL (ref 2.5–4.6)
PHOSPHORUS: 2.9 mg/dL (ref 2.5–4.6)
Phosphorus: 4 mg/dL (ref 2.5–4.6)

## 2015-07-17 LAB — CARBOXYHEMOGLOBIN
Carboxyhemoglobin: 0.4 % — ABNORMAL LOW (ref 0.5–1.5)
Methemoglobin: 0.7 % (ref 0.0–1.5)
O2 SAT: 66.9 %
TOTAL HEMOGLOBIN: 17.7 g/dL (ref 13.5–18.0)

## 2015-07-17 LAB — MAGNESIUM
MAGNESIUM: 1.6 mg/dL — AB (ref 1.7–2.4)
MAGNESIUM: 1.9 mg/dL (ref 1.7–2.4)
MAGNESIUM: 2.2 mg/dL (ref 1.7–2.4)

## 2015-07-17 LAB — TRIGLYCERIDES: Triglycerides: 63 mg/dL (ref <150)

## 2015-07-17 LAB — GLUCOSE, CAPILLARY
GLUCOSE-CAPILLARY: 141 mg/dL — AB (ref 65–99)
Glucose-Capillary: 125 mg/dL — ABNORMAL HIGH (ref 65–99)
Glucose-Capillary: 123 mg/dL — ABNORMAL HIGH (ref 65–99)
Glucose-Capillary: 45 mg/dL — ABNORMAL LOW (ref 65–99)
Glucose-Capillary: 73 mg/dL (ref 65–99)

## 2015-07-17 LAB — PROCALCITONIN: Procalcitonin: 3.49 ng/mL

## 2015-07-17 LAB — CORTISOL: CORTISOL PLASMA: 42.3 ug/dL

## 2015-07-17 LAB — TROPONIN I: TROPONIN I: 0.13 ng/mL — AB (ref <0.031)

## 2015-07-17 MED ORDER — PROPOFOL 1000 MG/100ML IV EMUL
0.0000 ug/kg/min | INTRAVENOUS | Status: DC
  Administered 2015-07-17: 5 ug/kg/min via INTRAVENOUS
  Filled 2015-07-17: qty 100

## 2015-07-17 MED ORDER — VASOPRESSIN 20 UNIT/ML IV SOLN
0.0300 [IU]/min | INTRAVENOUS | Status: DC
  Administered 2015-07-17: 0.03 [IU]/min via INTRAVENOUS
  Filled 2015-07-17 (×2): qty 2

## 2015-07-17 MED ORDER — PHENYLEPHRINE HCL 10 MG/ML IJ SOLN
30.0000 ug/min | INTRAVENOUS | Status: DC
  Administered 2015-07-18: 150 ug/min via INTRAVENOUS
  Filled 2015-07-17 (×4): qty 4

## 2015-07-17 MED ORDER — SODIUM CHLORIDE 0.9 % IV SOLN
25.0000 ug/h | INTRAVENOUS | Status: DC
  Filled 2015-07-17: qty 50

## 2015-07-17 MED ORDER — EPINEPHRINE HCL 1 MG/ML IJ SOLN
INTRAMUSCULAR | Status: AC
  Filled 2015-07-17: qty 3

## 2015-07-17 MED ORDER — SODIUM BICARBONATE 8.4 % IV SOLN
INTRAVENOUS | Status: DC
  Administered 2015-07-17 – 2015-07-18 (×2): via INTRAVENOUS
  Filled 2015-07-17 (×3): qty 150

## 2015-07-17 MED ORDER — MAGNESIUM SULFATE 4 GM/100ML IV SOLN
4.0000 g | Freq: Once | INTRAVENOUS | Status: AC
  Administered 2015-07-17: 4 g via INTRAVENOUS
  Filled 2015-07-17: qty 100

## 2015-07-17 MED ORDER — PANTOPRAZOLE SODIUM 40 MG PO PACK
40.0000 mg | PACK | ORAL | Status: DC
  Administered 2015-07-17 – 2015-07-18 (×2): 40 mg
  Filled 2015-07-17 (×2): qty 20

## 2015-07-17 MED ORDER — EPINEPHRINE HCL 1 MG/ML IJ SOLN
0.5000 ug/min | INTRAVENOUS | Status: DC
  Administered 2015-07-17: 5 ug/min via INTRAVENOUS
  Administered 2015-07-18 (×2): 20 ug/min via INTRAVENOUS
  Filled 2015-07-17 (×3): qty 4

## 2015-07-17 MED ORDER — SODIUM CHLORIDE 0.9 % IV BOLUS (SEPSIS)
1000.0000 mL | Freq: Once | INTRAVENOUS | Status: AC
  Administered 2015-07-17: 1000 mL via INTRAVENOUS

## 2015-07-17 MED ORDER — VANCOMYCIN HCL IN DEXTROSE 750-5 MG/150ML-% IV SOLN
750.0000 mg | Freq: Two times a day (BID) | INTRAVENOUS | Status: DC
  Administered 2015-07-17 – 2015-07-18 (×2): 750 mg via INTRAVENOUS
  Filled 2015-07-17 (×3): qty 150

## 2015-07-17 MED ORDER — DEXTROSE 50 % IV SOLN
50.0000 mL | Freq: Once | INTRAVENOUS | Status: AC
  Administered 2015-07-17: 50 mL via INTRAVENOUS

## 2015-07-17 MED ORDER — POTASSIUM CHLORIDE 10 MEQ/50ML IV SOLN
10.0000 meq | INTRAVENOUS | Status: AC
  Administered 2015-07-17 (×6): 10 meq via INTRAVENOUS
  Filled 2015-07-17 (×6): qty 50

## 2015-07-17 MED ORDER — SODIUM CHLORIDE 0.9 % IV SOLN: 0.0300 [IU]/min | INTRAVENOUS | Status: DC

## 2015-07-17 MED ORDER — DEXTROSE 10 % IV SOLN
INTRAVENOUS | Status: DC
  Administered 2015-07-17: 13:00:00 via INTRAVENOUS

## 2015-07-17 MED ORDER — MIDAZOLAM HCL 2 MG/2ML IJ SOLN: 2.0000 mg | Freq: Once | INTRAMUSCULAR | Status: DC

## 2015-07-17 MED ORDER — PHENYLEPHRINE HCL 10 MG/ML IJ SOLN
30.0000 ug/min | INTRAVENOUS | Status: DC
  Administered 2015-07-17: 60 ug/min via INTRAVENOUS
  Filled 2015-07-17: qty 1

## 2015-07-17 MED ORDER — SODIUM CHLORIDE 0.9 % IV SOLN
Freq: Once | INTRAVENOUS | Status: AC
  Administered 2015-07-17: 17:00:00 via INTRAVENOUS

## 2015-07-17 MED ORDER — FENTANYL BOLUS VIA INFUSION
50.0000 ug | INTRAVENOUS | Status: DC | PRN
  Filled 2015-07-17: qty 50

## 2015-07-17 MED ORDER — SODIUM CHLORIDE 0.9 % IV SOLN
100.0000 ug/h | INTRAVENOUS | Status: DC
  Administered 2015-07-17 – 2015-07-18 (×2): 150 ug/h via INTRAVENOUS
  Filled 2015-07-17: qty 50

## 2015-07-17 MED ORDER — SODIUM CHLORIDE 0.9 % IV SOLN
3.0000 ug/kg/min | INTRAVENOUS | Status: DC
  Administered 2015-07-17 – 2015-07-18 (×2): 3 ug/kg/min via INTRAVENOUS
  Filled 2015-07-17 (×2): qty 20

## 2015-07-17 MED ORDER — SODIUM BICARBONATE 8.4 % IV SOLN
INTRAVENOUS | Status: AC
  Filled 2015-07-17: qty 100

## 2015-07-17 MED ORDER — DEXTROSE 50 % IV SOLN
INTRAVENOUS | Status: AC
  Administered 2015-07-17: 50 mL via INTRAVENOUS
  Filled 2015-07-17: qty 50

## 2015-07-17 MED ORDER — SODIUM BICARBONATE 8.4 % IV SOLN
100.0000 meq | Freq: Once | INTRAVENOUS | Status: AC
  Administered 2015-07-17: 100 meq via INTRAVENOUS

## 2015-07-17 MED ORDER — FENTANYL CITRATE (PF) 100 MCG/2ML IJ SOLN: 100.0000 ug | Freq: Once | INTRAMUSCULAR | Status: DC

## 2015-07-17 MED ORDER — ARTIFICIAL TEARS OP OINT
1.0000 "application " | TOPICAL_OINTMENT | Freq: Three times a day (TID) | OPHTHALMIC | Status: DC
  Administered 2015-07-17 – 2015-07-18 (×2): 1 via OPHTHALMIC
  Filled 2015-07-17: qty 3.5

## 2015-07-17 MED ORDER — EPINEPHRINE HCL 1 MG/ML IJ SOLN
INTRAMUSCULAR | Status: AC
  Filled 2015-07-17: qty 1

## 2015-07-17 MED ORDER — CISATRACURIUM BOLUS VIA INFUSION
0.1000 mg/kg | Freq: Once | INTRAVENOUS | Status: AC
  Administered 2015-07-17: 8.7 mg via INTRAVENOUS
  Filled 2015-07-17: qty 9

## 2015-07-17 MED ORDER — MIDAZOLAM BOLUS VIA INFUSION
2.0000 mg | INTRAVENOUS | Status: DC | PRN
  Filled 2015-07-17: qty 2

## 2015-07-17 MED ORDER — AMIODARONE LOAD VIA INFUSION
150.0000 mg | Freq: Once | INTRAVENOUS | Status: AC
  Administered 2015-07-17: 150 mg via INTRAVENOUS
  Filled 2015-07-17: qty 83.34

## 2015-07-17 MED ORDER — SODIUM CHLORIDE 0.9 % IV SOLN
2.0000 mg/h | INTRAVENOUS | Status: DC
  Administered 2015-07-17 – 2015-07-18 (×2): 3 mg/h via INTRAVENOUS
  Filled 2015-07-17: qty 10

## 2015-07-17 MED ORDER — FENTANYL CITRATE (PF) 100 MCG/2ML IJ SOLN
100.0000 ug | Freq: Once | INTRAMUSCULAR | Status: DC | PRN
Start: 2015-07-17 — End: 2015-07-18

## 2015-07-17 MED ORDER — MIDAZOLAM HCL 2 MG/2ML IJ SOLN: 2.0000 mg | Freq: Once | INTRAMUSCULAR | Status: DC | PRN

## 2015-07-17 MED ORDER — SODIUM CHLORIDE 0.9 % IV SOLN
2.0000 mg/h | INTRAVENOUS | Status: DC
  Administered 2015-07-17: 2 mg/h via INTRAVENOUS
  Filled 2015-07-17: qty 10

## 2015-07-17 MED FILL — Medication: Qty: 1 | Status: AC

## 2015-07-17 NOTE — Progress Notes (Signed)
eLink Physician-Brief Progress Note Patient Name: [name] DOB: [birth year] MRN: [id number]   Date of Service  07/17/2015  HPI/Events of Note  Hypokalemia and hypomag  eICU Interventions  Potassium and mag replaced     Intervention Category Intermediate Interventions: Electrolyte abnormality - evaluation and management  Vernice Bowker 07/17/2015, 4:56 AM

## 2015-07-17 NOTE — Progress Notes (Signed)
Dr. Elease Hashimoto here to round on pt. Will continue to monitor pt closely.

## 2015-07-17 NOTE — Progress Notes (Signed)
Paged Dr. Nahser 

## 2015-07-17 NOTE — Progress Notes (Signed)
PULMONARY / CRITICAL CARE MEDICINE   Name: [name] MRN: [id number] DOB: 01/17/1966    ADMISSION DATE:  07/26/2015 CONSULTATION DATE:  July 26, 2015  REFERRING MD:  Dr. Algie Coffer  CHIEF COMPLAINT:  Short of breath  SUBJECTIVE:  Remains on sedation, pressors.  VITAL SIGNS: BP 75/45 mmHg  Pulse 38  Temp(Src) 98 F (36.7 C) (Oral)  Resp 20  Ht 5\' 7"  (1.702 m)  Wt 192 lb 3.9 oz (87.2 kg)  BMI 30.10 kg/m2  SpO2 100%  HEMODYNAMICS:    VENTILATOR SETTINGS: Vent Mode:  [-] PRVC FiO2 (%):  [40 %-100 %] 40 % Set Rate:  [20 bmp-35 bmp] 20 bmp Vt Set:  [530 mL] 530 mL PEEP:  [5 cmH20] 5 cmH20 Plateau Pressure:  [13 cmH20-19 cmH20] 13 cmH20  INTAKE / OUTPUT: I/O last 3 completed shifts: In: 4426.5 [I.V.:2743.1; NG/GT:833.3; IV Piggyback:850] Out: 581 [Urine:580; Stool:1]  PHYSICAL EXAMINATION: General: ill appearing Neuro:  RASS -3 HEENT:  ETT in place Cardiovascular:  Regular, bradycardic Lungs:  B/l crackles Abdomen:  Soft, non tender Musculoskeletal:  1+ edema Skin:  No rashes  LABS:  BMET  Recent Labs Lab 26-Jul-2015 1047 07-26-15 2125 07/17/15 0325  NA 139 139 141  K 6.4* 3.9 3.1*  CL 110 104 105  CO2 15* 20* 24  BUN 18 20 20   CREATININE 1.72* 2.14* 1.81*  GLUCOSE 103* 188* 123*    Electrolytes  Recent Labs Lab 2015-07-26 1047 26-Jul-2015 2125 Jul 26, 2015 2356 07/17/15 0325  CALCIUM 9.0 8.0*  --  7.9*  MG  --   --  1.9 1.6*  PHOS  --   --  2.9 2.9    CBC  Recent Labs Lab 07/26/15 0043 07-26-2015 0116 07/17/15 0325  WBC 6.7  --  5.1  HGB 16.5 18.0* 14.8  HCT 49.1 53.0* 42.9  PLT 93*  --  64*    Coag's  Recent Labs Lab 07/26/2015 1047  INR 2.78*    Sepsis Markers  Recent Labs Lab 07/26/2015 1330 07-26-2015 2125  LATICACIDVEN 12.4* 6.8*    ABG  Recent Labs Lab Jul 26, 2015 1444 07-26-15 1820 07/17/15 0335  PHART 7.249* 7.427 7.602*  PCO2ART 27.4* 25.0* 24.3*  PO2ART 214.0* 373* 85.5    Liver Enzymes  Recent Labs Lab  Jul 26, 2015 0043  AST 56*  ALT 34  ALKPHOS 74  BILITOT 1.6*  ALBUMIN 4.1    Cardiac Enzymes  Recent Labs Lab 2015-07-26 1500 07/26/2015 2125 07/17/15 0325  TROPONINI 0.06* 0.14* 0.13*    Glucose  Recent Labs Lab 07/26/2015 1322 2015-07-26 1537 07/26/15 1729 07/26/2015 2050 July 26, 2015 2346 07/17/15 0324  GLUCAP 137* 53* 224* 192* 161* 125*    Imaging Dg Chest Port 1 View  07/17/2015  CLINICAL DATA:  Intubation. EXAM: PORTABLE CHEST 1 VIEW COMPARISON:  July 26, 2015. FINDINGS: Endotracheal tube, NG tube, right IJ line stable position. Cardiomegaly with diffuse bilateral pulmonary infiltrates and bilateral pleural effusions consistent congestive heart failure. No pneumothorax. IMPRESSION: 1. Lines and tubes in stable position. 2. Cardiomegaly with development of bilateral diffuse pulmonary infiltrates and bilateral pleural effusions consistent with congestive heart failure. Electronically Signed   By: Maisie Fus  Register   On: 07/17/2015 07:00   Dg Chest Port 1 View  07-26-2015  CLINICAL DATA:  Status post central line placement EXAM: PORTABLE CHEST 1 VIEW COMPARISON:  07/26/2015 FINDINGS: Cardiac shadow is again enlarged. An endotracheal tube is now seen in satisfactory position 4.6 cm above the carina. A right jugular central line is noted in the proximal superior  vena cava. Nasogastric catheter is noted within the stomach. The lungs are well aerated bilaterally. Mild left basilar atelectasis is noted. No bony abnormality is seen. No pneumothorax is noted. IMPRESSION: Tubes and lines as described. No pneumothorax.  Persistent left atelectasis is noted. Electronically Signed   By: Alcide Clever M.D.   On: 07/17/2015 14:45     STUDIES:  6/15 Echo >> EF 20 to 25%, grade 1 diastolic CHF, severe MR, severe RV systolic dysfx, severe TR 6/15 CT angio chest >> patchy infiltrate, small Rt effusion 6/15 CT abd/pelvis >> passive hepatic congestion  CULTURES: 6/15 Blood >> 6/15 Sputum >>  6/15 Urine >>    ANTIBIOTICS: 6/15 Rocephin >> 6/15 Zithromax >>   SIGNIFICANT EVENTS: 6/15 Admit, cardiac arrest  LINES/TUBES: 6/15 ETT >> 6/15 Rt femoral aline >> 6/15 Rt IJ CVL >>   DISCUSSION: 50 yo male with Lt sided abdominal pain, dyspnea, a fib with RVR.  He was found to have PNA.  He developed cardiac arrest in ER  ASSESSMENT / PLAN:  PULMONARY A: Acute respiratory failure, hypoxia. Tobacco abuse, hx of COPD. P:   Full vent support F/u CXR, ABG Prn BDs  CARDIOVASCULAR A:  Bradycardic/PEA cardiac arrest 6/15 >> 3 minutes for ROSC. Cardiogenic shock. Nonischemic CM with biventricular failure in setting of ETOH. Valvular heart disease, A fib, HTN. P:  Amiodarone per cardiology Pressors to keep MAP > 65 Monitor CVP Hold outpt eliquis in setting of rectal bleed  RENAL A:   AKI. CKD II. Lactic acidosis. Hypokalemia, hypomagnesemia. P:   Monitor renal fx, urine outpt D/c HCO3 from IV fluid 6/16 Replace electrolytes as needed F/u lactic acid  GASTROINTESTINAL A:   Nutrition. Rectal bleeding. P:   Tube feeds Protonix for SUP If bleeding recurs, then consider GI assessment  HEMATOLOGIC A:   Thrombocytopenia in setting of ETOH. P:  F/u CBC SCDs  INFECTIOUS A:   CAP. P:   Day 2 of rocephin, zithromax  ENDOCRINE A:   Hyperglycemia. P:   SSI  NEUROLOGIC A:   Acute metabolic encephalopathy. Hx of ETOH, anxiety, depression, CVA. P:   RASS goal: -1 Change to diprivan, fentanyl gtt Thiamine, folic acid, MVI  D/w Dr. Algie Coffer.  CC time 31 minutes.  Coralyn Helling, MD [facility] Pulmonary/Critical Care 07/17/2015, 9:09 AM Pager:  [phone] After 3pm call: [phone]

## 2015-07-17 NOTE — Progress Notes (Signed)
Dr. Elease Hashimoto and Dr. Gala Romney notified of continued elevated heart rate 170-190s, along with hypotension. Orders received.  Will continue to monitor pt closely.

## 2015-07-17 NOTE — Progress Notes (Signed)
Dr. Gala Romney paged.

## 2015-07-17 NOTE — Progress Notes (Signed)
Called to bedside for persistent shock and hypoxemia despite multiple pressors. Review of data suggests cardiogenic shock compounded by ARDS. Known severe LV dysfunction, now with worsening ARDS and likely resultant RV dysfunction. On exam he is tachy in the 110s, but only perfusing every other beat or so. His lungs are diffusely ronchorous. Extremities are cool.   Inotropic therapy with epinephrine initiated with poor response Ventilator adjusted to PEEP 16, TV 480, RR 35, FiO2 100% Sodium acetate gtt increased to 150cc/hr Repeat ABG pending  Will try inhaled NO to see if pulmonary vasodilation improves his oxygenation and/or hemodynamics  Attempted to reach family / listed contacts at all available numbers all with no answer.  The patient is critically ill with multiple organ system failure and requires high complexity decision making for assessment and support, frequent evaluation and titration of therapies, advanced monitoring, review of radiographic studies and interpretation of complex data.   Critical Care Time devoted to patient care services, exclusive of separately billable procedures, described in this note is 40 minutes.   Nita Sickle, MD Pulmonary and Critical Care 07/17/2015 11:23 PM

## 2015-07-17 NOTE — Progress Notes (Signed)
Pharmacy Antibiotic Note  [name] is a 50 y.o. male admitted on 07/27/2015 with pneumonia.  Pharmacy has been consulted for Vancomycin dosing.  Plan: Vancomycin 750 IV every 750 hours.  Goal trough 15-20 mcg/mL.  Height: 5\' 7"  (170.2 cm) Weight: 192 lb 3.9 oz (87.2 kg) IBW/kg (Calculated) : 66.1  Temp (24hrs), Avg:97.2 F (36.2 C), Min:95.8 F (35.4 C), Max:98.9 F (37.2 C)   Recent Labs Lab 07/22/2015 0043 07/25/2015 0116 07/22/2015 1047 07/03/2015 1330 07/02/2015 2125 07/17/15 0325  WBC 6.7  --   --   --   --  5.1  CREATININE 1.48* 1.40* 1.72*  --  2.14* 1.81*  LATICACIDVEN  --   --   --  12.4* 6.8*  --     Estimated Creatinine Clearance: 51.5 mL/min (by C-G formula based on Cr of 1.81).    Allergies  Allergen Reactions  . Bee Venom Anaphylaxis    Uses epi pen-- last used 08/23/11    Thank you for allowing pharmacy to be a part of this patient's care. Okey Regal, PharmD [phone]  07/17/2015 7:36 PM

## 2015-07-17 NOTE — Progress Notes (Signed)
Salome Arnt lreturned Dr. Gala Romney page, notified of continued heart rate 170-180s along with hypotension.  Will continue to monitor pt closely.

## 2015-07-17 NOTE — Progress Notes (Signed)
eLink Physician-Brief Progress Note Patient Name: [name] DOB: [birth year] MRN: [id number]   Date of Service  07/17/2015  HPI/Events of Note  Patient with resp alkalosis on vent, pH 7.07/25/83/24  eICU Interventions  Plan: Decrease vent rate from 35 to 20     Intervention Category Major Interventions: Acid-Base disturbance - evaluation and management  DETERDING,ELIZABETH 07/17/2015, 3:54 AM

## 2015-07-17 NOTE — Progress Notes (Signed)
Dr. Bensimhon paged. 

## 2015-07-17 NOTE — Consult Note (Addendum)
Advanced Heart Failure Team Consult Note  Referring Physician: [id number] Primary Physician:  Primary Cardiologist:  Dr Doylene Canard  Reason for Consultation: A/C systolic HC  HPI:    [name] is a 50 y.o. past medical history significant for nonischemic dilated cardiomyopathy, hypertension, history of A. fib with RVR in the past CHA2DS2VASc score of 4, anxiety disorder, history of TIA, EtOH abuse, tobacco abuse, noncompliant to medication and follow-up who presented to [facility] 07/17/2015 with worsening SOB x 1 week with associated pleuritic chest pain and palpitations. Noted to be in Afib RVR.  Pertinent labs on admission include K 4.2 that spiked to 6.9, Creatinine 1.48, troponin flat at 0.13, BNP 594.3, Chest x-ray showed cardiomegaly and mild interstitial edema. CT angio chest showed changes of right heart failure, possible pneumonia and no evidence of significant pulmonary embolus.  Shortly after admission he became agitated requiring IV ativan. He then suffered a cardiac arrest preceded by short duration of bradycardia and cyanosis per staff.  PEA arrest < 3 minutes.  He was intubated and transferred to ICU for further evaluation.   Social history remarkable for > 360 oz of ETOH DAILY on weekends (stated he drinks 9 40z bottles daily on Fri, Sat, Sunday)  Currently intubated and sedated. Pan cultures pending.   Drips include Milrinone 0.25 mcg/kg/min, Norep at 30 and Amio at 30. CVP 5-6. Coox 66%. Remains in AF with RVR  6/15 Echo >> EF 20 to 99%, grade 1 diastolic CHF, severe Robert, severe RV systolic dysfx, severe TR LVIDd 5.9cm 6/15 CT angio chest >> patchy infiltrate, small Rt effusion 6/15 CT abd/pelvis >> passive hepatic congestion  Review of Systems: [y] = yes, [ ]  = no   -- not available due to intubation (results from chart)  General: Weight gain [ ] ; Weight loss [ ] ; Anorexia [ ] ; Fatigue [ ] ; Fever [ ] ; Chills [ ] ; Weakness [ ]   Cardiac: Chest pain/pressure [y]; Resting SOB [ ] ;  Exertional SOB [y]; Orthopnea [ ] ; Pedal Edema [ ] ; Palpitations [y]; Syncope [ ] ; Presyncope [ ] ; Paroxysmal nocturnal dyspnea[ ]   Pulmonary: Cough [ ] ; Wheezing[ ] ; Hemoptysis[ ] ; Sputum [ ] ; Snoring [ ]   GI: Vomiting[ ] ; Dysphagia[ ] ; Melena[ ] ; Hematochezia [ ] ; Heartburn[ ] ; Abdominal pain [ ] ; Constipation [ ] ; Diarrhea [ ] ; BRBPR [ ]   GU: Hematuria[ ] ; Dysuria [ ] ; Nocturia[ ]   Vascular: Pain in legs with walking [ ] ; Pain in feet with lying flat [ ] ; Non-healing sores [ ] ; Stroke [ ] ; TIA [ ] ; Slurred speech [ ] ;  Neuro: Headaches[ ] ; Vertigo[ ] ; Seizures[ ] ; Paresthesias[ ] ;Blurred vision [ ] ; Diplopia [ ] ; Vision changes [ ]   Ortho/Skin: Arthritis [ ] ; Joint pain [ ] ; Muscle pain [ ] ; Joint swelling [ ] ; Back Pain [ ] ; Rash [ ]   Psych: Depression[ ] ; Anxiety[ ]   Heme: Bleeding problems [ ] ; Clotting disorders [ ] ; Anemia [ ]   Endocrine: Diabetes [ ] ; Thyroid dysfunction[ ]   Home Medications Prior to Admission medications   Medication Sig Start Date End Date Taking? Authorizing Provider  albuterol (PROVENTIL HFA;VENTOLIN HFA) 108 (90 BASE) MCG/ACT inhaler Inhale 2 puffs into the lungs every 6 (six) hours as needed for wheezing or shortness of breath. 07/08/14  Yes Samuella Cota, MD  apixaban (ELIQUIS) 5 MG TABS tablet Take 1 tablet (5 mg total) by mouth 2 (two) times daily. 06/04/15  Yes Dorie Rank, MD  atorvastatin (LIPITOR) 40 MG tablet Take 1 tablet (  40 mg total) by mouth daily at 6 PM. 06/04/15  Yes Dorie Rank, MD  digoxin (LANOXIN) 0.25 MG tablet Take 1 tablet (0.25 mg total) by mouth daily. 06/13/15  Yes Costin Karlyne Greenspan, MD  furosemide (LASIX) 20 MG tablet Take 2 tablets (40 mg total) by mouth daily. 06/13/15  Yes Costin Karlyne Greenspan, MD  ibuprofen (ADVIL,MOTRIN) 200 MG tablet Take 200 mg by mouth every 6 (six) hours as needed for moderate pain.   Yes Historical Provider, MD  metoprolol (LOPRESSOR) 50 MG tablet Take 1.5 tablets (75 mg total) by mouth 2 (two) times daily. 06/13/15  Yes  Costin Karlyne Greenspan, MD  oxyCODONE-acetaminophen (PERCOCET/ROXICET) 5-325 MG tablet Take 2 tablets by mouth every 6 (six) hours as needed for severe pain. 06/13/15  Yes Costin Karlyne Greenspan, MD  potassium chloride SA (K-DUR,KLOR-CON) 20 MEQ tablet Take 1 tablet (20 mEq total) by mouth daily. 06/04/15  Yes Dorie Rank, MD  sacubitril-valsartan (ENTRESTO) 24-26 MG Take 1 tablet by mouth 2 (two) times daily. 06/13/15  Yes Costin Karlyne Greenspan, MD    Past Medical History: Past Medical History  Diagnosis Date  . Hypertension   . Alcoholism /alcohol abuse ([place])   . Anxiety and depression   . Tobacco abuse   . Atrial fibrillation ([place])   . Anxiety   . Depression   . Myocardial infarction ([place])   . Stroke ([place])   . History of hiatal hernia   . Headache   . CKD (chronic kidney disease), stage II   . COPD (chronic obstructive pulmonary disease) ([place])     Past Surgical History: Past Surgical History  Procedure Laterality Date  . Hernia repair      UHR x2  . Tee without cardioversion N/A 08/01/2014    Procedure: TRANSESOPHAGEAL ECHOCARDIOGRAM (TEE);  Surgeon: Dixie Dials, MD;  Location[facility] ENDOSCOPY;  Service: Cardiovascular;  Laterality: N/A;  . Fracture surgery      right FA  . Tee without cardioversion N/A 05/15/2015    Procedure: TRANSESOPHAGEAL ECHOCARDIOGRAM (TEE);  Surgeon: Dixie Dials, MD;  Location: [facility] ENDOSCOPY;  Service: Cardiovascular;  Laterality: N/A;    Family History: Family History  Problem Relation Age of Onset  . Heart attack Mother   . Heart attack Father   . Hypertension Mother   . Hypertension Father     Social History: Social History   Social History  . Marital Status: Single    Spouse Name: N/A  . Number of Children: N/A  . Years of Education: N/A   Social History Main Topics  . Smoking status: Former Smoker -- 1.00 packs/day for 30 years    Types: Cigarettes    Quit date: 05/02/2014  . Smokeless tobacco: Never Used  . Alcohol Use: 54.0 oz/week    90 Cans of  beer per week     Comment: 9, 40 OZ BEERS A DAY friday saurday and sunday  . Drug Use: No  . Sexual Activity: Yes    Birth Control/ Protection: Condom   Other Topics Concern  . None   Social History Narrative   He is currently homeless and sleeps in an abandoned truck in sleeping bags. He works part time for [employer]. He has a 16 year old daughter who lives in [place]. He is originally from [place].     Allergies:  Allergies  Allergen Reactions  . Bee Venom Anaphylaxis    Uses epi pen-- last used 08/23/11    Objective:    Vital  Signs:   Temp:  [95.8 F (35.4 C)-98.9 F (37.2 C)] 98.9 F (37.2 C) (06/16 1221) Pulse Rate:  [38-137] 38 (06/16 0831) Resp:  [18-36] 21 (06/16 1221) BP: (48-136)/(22-103) 84/69 mmHg (06/16 1221) SpO2:  [89 %-100 %] 100 % (06/16 1221) Arterial Line BP: (52-139)/(41-88) 94/55 mmHg (06/16 1145) FiO2 (%):  [40 %-100 %] 40 % (06/16 1118) Weight:  [192 lb 3.9 oz (87.2 kg)] 192 lb 3.9 oz (87.2 kg) (06/16 0234)    Weight change: Filed Weights   07/17/2015 0034 07/12/2015 0700 07/17/15 0234  Weight: 181 lb (82.101 kg) 177 lb 0.5 oz (80.3 kg) 192 lb 3.9 oz (87.2 kg)    Intake/Output:   Intake/Output Summary (Last 24 hours) at 07/17/15 1238 Last data filed at 07/17/15 1117  Gross per 24 hour  Intake 5028.92 ml  Output    640 ml  Net 4388.92 ml     Physical Exam: General:  Intubated sedatd HEENT: normal x for ETT Neck: supple. RIJ TLC Carotids 2+ bilat; no bruits. No lymphadenopathy or thyromegaly appreciated. Cor: PMI laterally displaced. IRREG tachy + s3 +Robert Lungs: clear Abdomen: soft, nontender, nondistended. No hepatosplenomegaly. No bruits or masses. Good bowel sounds. Extremities: no cyanosis, clubbing, rash, edema Neuro: intubated/sedated  Telemetry: AF 140   Labs: Basic Metabolic Panel:  Recent Labs Lab 07/03/2015 0043 07/21/2015 0116 07/03/2015 1047 07/11/2015 2125 07/23/2015 2356 07/17/15 0325  NA 136 137 139 139   --  141  K 4.2 6.9* 6.4* 3.9  --  3.1*  CL 106 106 110 104  --  105  CO2 18*  --  15* 20*  --  24  GLUCOSE 107* 97 103* 188*  --  123*  BUN 14 23* 18 20  --  20  CREATININE 1.48* 1.40* 1.72* 2.14*  --  1.81*  CALCIUM 9.8  --  9.0 8.0*  --  7.9*  MG  --   --   --   --  1.9 1.6*  PHOS  --   --   --   --  2.9 2.9    Liver Function Tests:  Recent Labs Lab 07/22/2015 0043  AST 56*  ALT 34  ALKPHOS 74  BILITOT 1.6*  PROT 7.4  ALBUMIN 4.1    Recent Labs Lab 07/07/2015 0043  LIPASE 22   No results for input(s): AMMONIA in the last 168 hours.  CBC:  Recent Labs Lab 07/22/2015 0043 07/12/2015 0116 07/17/15 0325  WBC 6.7  --  5.1  HGB 16.5 18.0* 14.8  HCT 49.1 53.0* 42.9  MCV 96.5  --  92.7  PLT 93*  --  64*    Cardiac Enzymes:  Recent Labs Lab 07/24/2015 0425 07/19/2015 1047 07/17/2015 1500 07/17/2015 2125 07/17/15 0325  TROPONINI 0.04* 0.06* 0.06* 0.14* 0.13*    BNP: BNP (last 3 results)  Recent Labs  05/14/15 1446 06/09/15 2005 07/14/2015 0425  BNP 1076.6* 1590.7* 594.3*    ProBNP (last 3 results) No results for input(s): PROBNP in the last 8760 hours.   CBG:  Recent Labs Lab 07/11/2015 1729 07/04/2015 2050 07/26/2015 2346 07/17/15 0324 07/17/15 0818  GLUCAP 224* 192* 161* 125* 123*    Coagulation Studies:  Recent Labs  07/05/2015 1047  LABPROT 28.9*  INR 2.78*    Other results: EKG: AF 157 non-specific t wave abnormality  Imaging: Ct Angio Chest Pe W/cm &/or Wo Cm  07/26/2015  CLINICAL DATA:  Sudden onset left-sided abdominal pain, shortness of breath, left arm and left  leg numbness for 15 minutes. EXAM: CT ANGIOGRAPHY CHEST CT ABDOMEN AND PELVIS WITH CONTRAST TECHNIQUE: Multidetector CT imaging of the chest was performed using the standard protocol during bolus administration of intravenous contrast. Multiplanar CT image reconstructions and MIPs were obtained to evaluate the vascular anatomy. Multidetector CT imaging of the abdomen and pelvis was  performed using the standard protocol during bolus administration of intravenous contrast. CONTRAST:  100 mL Isovue 370 COMPARISON:  CT abdomen and pelvis 06/10/2015 FINDINGS: CTA CHEST FINDINGS Technically adequate study with good opacification of the central and segmental pulmonary arteries. No focal filling defects demonstrated. No evidence of significant pulmonary embolus. Cardiac enlargement with reflux of contrast material into the inferior vena cava and hepatic veins consistent with right heart failure. Normal caliber thoracic aorta. Calcified lymph nodes in the mediastinum consistent with old granulomatous infection. No significant lymphadenopathy in the chest. Esophagus is decompressed. Evaluation of lungs is limited due to motion artifact. There is a small right pleural effusion with fluid in the left major fissure. Interstitial changes likely represent mild edema. There are some patchy focal areas of infiltration in the mid lungs bilaterally possibly representing pneumonia. No pneumothorax. Airways are patent. CT ABDOMEN and PELVIS FINDINGS Examination is technically limited due to motion artifact. Passive hepatic congestion. No focal liver lesions identified. Gallbladder demonstrates diffusely thickened wall, nonspecific but possibly indicating cholecystitis. No stones identified. No bile duct dilatation. The pancreas, spleen, adrenal glands, kidneys, abdominal aorta, inferior vena cava, and retroperitoneal lymph nodes are unremarkable. Stomach, small bowel, and colon are decompressed. No free air or free fluid in the abdomen. Pelvis: Bladder is decompressed. Prostate gland is not enlarged. No free or loculated pelvic fluid collections. No pelvic mass or lymphadenopathy. Appendix is normal. No evidence of diverticulitis. Mobile testes are noted in the lower inguinal canals bilaterally. Degenerative changes in the spine. No destructive bone lesions. Review of the MIP images confirms the above findings.  IMPRESSION: No evidence of significant pulmonary embolus. Changes of right heart failure with passive hepatic congestion. Small bilateral pleural effusions. Mild interstitial edema. Focal patchy areas of airspace disease in both lungs may represent superimposed pneumonia. Gallbladder wall is thickened. This may indicate cholecystitis. Electronically Signed   By: Lucienne Capers M.D.   On: 07/10/2015 03:29   Ct Abdomen Pelvis W Contrast  07/27/2015  CLINICAL DATA:  Sudden onset left-sided abdominal pain, shortness of breath, left arm and left leg numbness for 15 minutes. EXAM: CT ANGIOGRAPHY CHEST CT ABDOMEN AND PELVIS WITH CONTRAST TECHNIQUE: Multidetector CT imaging of the chest was performed using the standard protocol during bolus administration of intravenous contrast. Multiplanar CT image reconstructions and MIPs were obtained to evaluate the vascular anatomy. Multidetector CT imaging of the abdomen and pelvis was performed using the standard protocol during bolus administration of intravenous contrast. CONTRAST:  100 mL Isovue 370 COMPARISON:  CT abdomen and pelvis 06/10/2015 FINDINGS: CTA CHEST FINDINGS Technically adequate study with good opacification of the central and segmental pulmonary arteries. No focal filling defects demonstrated. No evidence of significant pulmonary embolus. Cardiac enlargement with reflux of contrast material into the inferior vena cava and hepatic veins consistent with right heart failure. Normal caliber thoracic aorta. Calcified lymph nodes in the mediastinum consistent with old granulomatous infection. No significant lymphadenopathy in the chest. Esophagus is decompressed. Evaluation of lungs is limited due to motion artifact. There is a small right pleural effusion with fluid in the left major fissure. Interstitial changes likely represent mild edema. There are some patchy focal  areas of infiltration in the mid lungs bilaterally possibly representing pneumonia. No  pneumothorax. Airways are patent. CT ABDOMEN and PELVIS FINDINGS Examination is technically limited due to motion artifact. Passive hepatic congestion. No focal liver lesions identified. Gallbladder demonstrates diffusely thickened wall, nonspecific but possibly indicating cholecystitis. No stones identified. No bile duct dilatation. The pancreas, spleen, adrenal glands, kidneys, abdominal aorta, inferior vena cava, and retroperitoneal lymph nodes are unremarkable. Stomach, small bowel, and colon are decompressed. No free air or free fluid in the abdomen. Pelvis: Bladder is decompressed. Prostate gland is not enlarged. No free or loculated pelvic fluid collections. No pelvic mass or lymphadenopathy. Appendix is normal. No evidence of diverticulitis. Mobile testes are noted in the lower inguinal canals bilaterally. Degenerative changes in the spine. No destructive bone lesions. Review of the MIP images confirms the above findings. IMPRESSION: No evidence of significant pulmonary embolus. Changes of right heart failure with passive hepatic congestion. Small bilateral pleural effusions. Mild interstitial edema. Focal patchy areas of airspace disease in both lungs may represent superimposed pneumonia. Gallbladder wall is thickened. This may indicate cholecystitis. Electronically Signed   By: Lucienne Capers M.D.   On: 07/17/2015 03:29   Dg Chest Port 1 View  07/17/2015  CLINICAL DATA:  Intubation. EXAM: PORTABLE CHEST 1 VIEW COMPARISON:  07/03/2015. FINDINGS: Endotracheal tube, NG tube, right IJ line stable position. Cardiomegaly with diffuse bilateral pulmonary infiltrates and bilateral pleural effusions consistent congestive heart failure. No pneumothorax. IMPRESSION: 1. Lines and tubes in stable position. 2. Cardiomegaly with development of bilateral diffuse pulmonary infiltrates and bilateral pleural effusions consistent with congestive heart failure. Electronically Signed   By: Marcello Moores  Register   On: 07/17/2015  07:00   Dg Chest Port 1 View  07/14/2015  CLINICAL DATA:  Status post central line placement EXAM: PORTABLE CHEST 1 VIEW COMPARISON:  07/22/2015 FINDINGS: Cardiac shadow is again enlarged. An endotracheal tube is now seen in satisfactory position 4.6 cm above the carina. A right jugular central line is noted in the proximal superior vena cava. Nasogastric catheter is noted within the stomach. The lungs are well aerated bilaterally. Mild left basilar atelectasis is noted. No bony abnormality is seen. No pneumothorax is noted. IMPRESSION: Tubes and lines as described. No pneumothorax.  Persistent left atelectasis is noted. Electronically Signed   By: Inez Catalina M.D.   On: 07/31/2015 14:45   Dg Chest Port 1 View  07/19/2015  CLINICAL DATA:  Acute onset of shortness of breath. Atrial fibrillation. Initial encounter. EXAM: PORTABLE CHEST 1 VIEW COMPARISON:  Chest radiograph from 06/09/2015 FINDINGS: The lungs are well-aerated. Mild bibasilar opacities may reflect atelectasis or mild interstitial edema. There is no evidence of pleural effusion or pneumothorax. The cardiomediastinal silhouette is mildly enlarged. No acute osseous abnormalities are seen. IMPRESSION: Mild bibasilar opacities may reflect atelectasis or mild interstitial edema. Mild cardiomegaly. Electronically Signed   By: Garald Balding M.D.   On: 07/30/2015 01:52      Medications:     Current Medications: . amiodarone  150 mg Intravenous Once  . antiseptic oral rinse  7 mL Mouth Rinse 10 times per day  . azithromycin  500 mg Intravenous Q24H  . calcium chloride  1 g Intravenous Once  . cefTRIAXone (ROCEPHIN)  IV  1 g Intravenous Q24H  . chlorhexidine gluconate (SAGE KIT)  15 mL Mouth Rinse BID  . feeding supplement (PRO-STAT SUGAR FREE 64)  30 mL Per Tube 5 X Daily  . multivitamin with minerals  1 tablet Oral  Daily  . pantoprazole sodium  40 mg Per Tube Q24H  . sodium chloride flush  10-40 mL Intracatheter Q12H  . sodium chloride  flush  3 mL Intravenous Q12H  . thiamine  100 mg Oral Daily     Infusions: . amiodarone 30 mg/hr (07/17/15 1008)  . feeding supplement (VITAL 1.5 CAL) 1,000 mL (07/17/15 0800)  . fentaNYL infusion INTRAVENOUS 100 mcg/hr (07/17/15 1000)  . milrinone 0.25 mcg/kg/min (07/17/15 0800)  . norepinephrine (LEVOPHED) Adult infusion 20 mcg/min (07/17/15 1100)  . propofol (DIPRIVAN) infusion 5 mcg/kg/min (07/17/15 1023)      Assessment   1. Acute on chronic systolic HF with cardiogenic shock - EF 15% 2. Cardiac arrest 3. Acute renal failure on CKD stage II 4. Hypokalemia  (was hyperkalemic on admit) 5. ETOH abuse, heavy 6. CAP - on zithromax and rocephin 7. Acute metabolic encephalopathy 8. AF with RVR 9. Acute respiratory failure   Plan    Pt currently intubated and sedated and on dual pressors.  ART line pressures in 110s.   Will send stat Coox.  LVEF 20-25% yesterday by echo, apparently while on milrinone. Pt may need mechanical support if unresponsive to medical therapy.  He would likely be a poor candidate for advanced therapies with his marked ETOH abuse.   [name] is in critical condition and prognosis is very guarded at this time.   Length of Stay: 1  Shirley Friar PA-C 07/17/2015, 12:38 PM  Advanced Heart Failure Team Pager [phone] (M-F; 7a - 4p)  Please contact [facility] for night-coverage after hours (4p -7a ) and weekends on [email or web address]  Patient seen and examined with Oda Kilts, PA-C. We discussed all aspects of the encounter. I agree with the assessment and plan as stated above.   He is critically ill with multi-system organ failure and recent cardiac arrest in the setting of severe cardiomyopathy. Suspect cardiomyopathy related to ETOH or AF (or both). LV diameters are not that large and thus may be more related to AF. Currently on dual pressors with adequate co-ox. Volume status ok. Will increase amio to 60/hr. With severe LV dysfunction and  heavy ETOH use would switch propofol to versed. Supp K and mag.   Prognosis very tenuous.   Marlissa Emerick,MD 3:02 PM

## 2015-07-17 NOTE — Progress Notes (Signed)
CRITICAL VALUE ALERT  Critical value received:  Lactic 5.0  Date of notification:  6/16  Time of notification:  2050  Critical value read back:Yes.    Nurse who received alert:  Marco Collie RN  MD notified (1st page):  Joanne Gavel  Time of first page:  2055  Responding MD:  Joanne Gavel  Time MD responded:  2055  No new orders.

## 2015-07-17 NOTE — Progress Notes (Signed)
eLink Physician-Brief Progress Note Patient Name: [name] DOB: December 30, 1965 MRN: [id number]   Date of Service  07/17/2015  HPI/Events of Note  Combined metabolic and respiratory acidosis on ABG.   eICU Interventions  Increase RR, adjust PEEP/FiO2 per ARDS protocol, add HCO3 gtt, f/u ABG.      Intervention Category Major Interventions: Other:  Betsie Peckman 07/17/2015, 8:09 PM

## 2015-07-17 NOTE — Progress Notes (Signed)
Received request from nurse to try and call patient's family with update that the patient's condition is tenuous and has worsened this evening. I called the numbers listed in the chart and was unable to reach anyone. One of [employer] numbers goes to a voicemail box that is full; I left a message on the mobile phone listed. I also left a voicemail on Tammy Tysinger's mobile number listed to please give the unit a call back.

## 2015-07-17 NOTE — Progress Notes (Signed)
Ref: PLACEY,MARY H, NP   Subjective:  Intubated and sedated. Mildly tachycardic and low normal blood pressures on inotropic agents.  Objective:  Vital Signs in the last 24 hours: Temp:  [95.8 F (35.4 C)-98.9 F (37.2 C)] 98.9 F (37.2 C) (06/16 1221) Pulse Rate:  [38-113] 113 (06/16 1200) Cardiac Rhythm:  [-] Atrial fibrillation;Atrial flutter (06/16 1200) Resp:  [20-36] 23 (06/16 1545) BP: (69-136)/(45-103) 94/73 mmHg (06/16 1500) SpO2:  [96 %-100 %] 96 % (06/16 1545) Arterial Line BP: (52-139)/(41-88) 104/53 mmHg (06/16 1545) FiO2 (%):  [40 %-100 %] 40 % (06/16 1200) Weight:  [87.2 kg (192 lb 3.9 oz)] 87.2 kg (192 lb 3.9 oz) (06/16 0234)  Physical Exam: BP Readings from Last 1 Encounters:  07/17/15 94/73    Wt Readings from Last 1 Encounters:  07/17/15 87.2 kg (192 lb 3.9 oz)    Weight change: 5.099 kg (11 lb 3.9 oz)  HEENT: [place]/AT, Eyes-Conjunctiva-Pale pink, Sclera-Non-icteric. Intubated and with OG tube. Neck: No JVD, No bruit, Trachea midline. Triple lumen cental line in place. Lungs:  Coarce crackles, Bilateral. Cardiac:  Irregular rhythm with tachycardia, normal S1 and S2, no S3.  Abdomen:  Soft. Extremities:  No edema present. No cyanosis. No clubbing. CNS: AxOx0.. Skin: Warm and dry.   Intake/Output from previous day: 06/15 0701 - 06/16 0700 In: 4423.5 [I.V.:2740.1; NG/GT:833.3; IV Piggyback:850] Out: 580 [Urine:580]    Lab Results: BMET    Component Value Date/Time   NA 141 07/17/2015 0325   NA 139 07/19/2015 2125   NA 139 07/24/2015 1047   K 3.1* 07/17/2015 0325   K 3.9 07/11/2015 2125   K 6.4* 07/27/2015 1047   CL 105 07/17/2015 0325   CL 104 07/15/2015 2125   CL 110 07/30/2015 1047   CO2 24 07/17/2015 0325   CO2 20* 07/05/2015 2125   CO2 15* 07/02/2015 1047   GLUCOSE 123* 07/17/2015 0325   GLUCOSE 188* 07/03/2015 2125   GLUCOSE 103* 07/21/2015 1047   BUN 20 07/17/2015 0325   BUN 20 07/12/2015 2125   BUN 18 07/25/2015 1047   CREATININE  1.81* 07/17/2015 0325   CREATININE 2.14* 07/21/2015 2125   CREATININE 1.72* 07/05/2015 1047   CALCIUM 7.9* 07/17/2015 0325   CALCIUM 8.0* 07/28/2015 2125   CALCIUM 9.0 07/08/2015 1047   GFRNONAA 42* 07/17/2015 0325   GFRNONAA 34* 07/03/2015 2125   GFRNONAA 45* 07/03/2015 1047   GFRAA 49* 07/17/2015 0325   GFRAA 40* 07/13/2015 2125   GFRAA 52* 07/30/2015 1047   CBC    Component Value Date/Time   WBC 5.1 07/17/2015 0325   RBC 4.63 07/17/2015 0325   HGB 14.8 07/17/2015 0325   HCT 42.9 07/17/2015 0325   HCT 36.5* 10/20/2014 0210   PLT 64* 07/17/2015 0325   MCV 92.7 07/17/2015 0325   MCH 32.0 07/17/2015 0325   MCHC 34.5 07/17/2015 0325   RDW 13.4 07/17/2015 0325   LYMPHSABS 0.5* 06/11/2015 0228   MONOABS 1.0 06/11/2015 0228   EOSABS 0.0 06/11/2015 0228   BASOSABS 0.0 06/11/2015 0228   HEPATIC Function Panel  Recent Labs  06/09/15 2023 06/11/15 0228 07/05/2015 0043  PROT 6.0* 5.7* 7.4   HEMOGLOBIN A1C No components found for: HGA1C,  MPG CARDIAC ENZYMES Lab Results  Component Value Date   CKTOTAL 129 09/13/2009   CKMB 2.4 09/13/2009   TROPONINI 0.13* 07/17/2015   TROPONINI 0.14* 07/10/2015   TROPONINI 0.06* 07/02/2015   BNP No results for input(s): PROBNP in the last 8760 hours.  TSH  Recent Labs  07/30/14 2321 10/18/14 1904 05/14/15 1446  TSH 1.012 0.774 1.970   CHOLESTEROL  Recent Labs  10/19/14 0244 05/15/15 0207 06/10/15 0234  CHOL 202* 137 134    Scheduled Meds: . antiseptic oral rinse  7 mL Mouth Rinse 10 times per day  . azithromycin  500 mg Intravenous Q24H  . calcium chloride  1 g Intravenous Once  . cefTRIAXone (ROCEPHIN)  IV  1 g Intravenous Q24H  . chlorhexidine gluconate (SAGE KIT)  15 mL Mouth Rinse BID  . feeding supplement (PRO-STAT SUGAR FREE 64)  30 mL Per Tube 5 X Daily  . multivitamin with minerals  1 tablet Oral Daily  . pantoprazole sodium  40 mg Per Tube Q24H  . sodium chloride flush  10-40 mL Intracatheter Q12H  . sodium  chloride flush  3 mL Intravenous Q12H  . thiamine  100 mg Oral Daily   Continuous Infusions: . amiodarone 60 mg/hr (07/17/15 1617)  . dextrose 40 mL/hr at 07/17/15 1304  . feeding supplement (VITAL 1.5 CAL) 1,000 mL (07/17/15 0800)  . fentaNYL infusion INTRAVENOUS 150 mcg/hr (07/17/15 1415)  . midazolam (VERSED) infusion 2 mg/hr (07/17/15 1535)  . milrinone 0.25 mcg/kg/min (07/17/15 0800)  . norepinephrine (LEVOPHED) Adult infusion 30 mcg/min (07/17/15 1415)   PRN Meds:.sodium chloride, albuterol, fentaNYL, ondansetron (ZOFRAN) IV, sodium chloride flush, sodium chloride flush  Assessment/Plan: Acute respiratory failure with pulmonary edema Acuteon chronic left heart systolic failure. Atrial fibrillation with RVR, chronic with CHA2DS2VASc score of 4 Chronic right heart systolic failure Possible pneumonia ETOH abuse with withdrawal syndrome Tobacco use disorder Anxiety/Panic attack Moderate to severe MR and TR Abdominal pain CKD, II  Acute GI bleed  Heart failure team consult. Appreciate CCM and Dr. Haroldine Laws.   LOS: 1 day    Dixie Dials  MD  07/17/2015, 4:17 PM

## 2015-07-17 NOTE — Progress Notes (Signed)
eLink Physician-Brief Progress Note Patient Name: [name] DOB: 1965/12/29 MRN: [id number]   Date of Service  07/17/2015  HPI/Events of Note  Patient remains in shock despite max NE and vasopressin gtts.  Also with resp acidosis on ARDS protocol on acetate gtt.  eICU Interventions  Plan: 2 amps of NA bicarb IVP Check LFTs.  If patient has liver dysfunction may not be able to convert acetate to bicarb Start NEO gtt for BP support     Intervention Category Major Interventions: Shock - evaluation and management;Acid-Base disturbance - evaluation and management  DETERDING,ELIZABETH 07/17/2015, 9:50 PM

## 2015-07-18 ENCOUNTER — Inpatient Hospital Stay (HOSPITAL_COMMUNITY): Payer: Medicaid Other

## 2015-07-18 LAB — BLOOD GAS, ARTERIAL
ACID-BASE DEFICIT: 11 mmol/L — AB (ref 0.0–2.0)
Acid-base deficit: 10.3 mmol/L — ABNORMAL HIGH (ref 0.0–2.0)
Acid-base deficit: 6.7 mmol/L — ABNORMAL HIGH (ref 0.0–2.0)
BICARBONATE: 16.6 meq/L — AB (ref 20.0–24.0)
BICARBONATE: 17.7 meq/L — AB (ref 20.0–24.0)
Bicarbonate: 20.9 mEq/L (ref 20.0–24.0)
DRAWN BY: 29017
DRAWN BY: 24513
Drawn by: 24513
FIO2: 1
FIO2: 1
FIO2: 1
LHR: 35 {breaths}/min
LHR: 35 {breaths}/min
MECHVT: 480 mL
NITRIC OXIDE: 30
O2 SAT: 81.9 %
O2 Saturation: 81 %
O2 Saturation: 82.1 %
PATIENT TEMPERATURE: 99
PATIENT TEMPERATURE: 99.4
PCO2 ART: 51.9 mmHg — AB (ref 35.0–45.0)
PCO2 ART: 59.7 mmHg — AB (ref 35.0–45.0)
PCO2 ART: 64.1 mmHg — AB (ref 35.0–45.0)
PEEP/CPAP: 16 cmH2O
PEEP: 16 cmH2O
PEEP: 16 cmH2O
PH ART: 7.103 — AB (ref 7.350–7.450)
PH ART: 7.131 — AB (ref 7.350–7.450)
PH ART: 7.143 — AB (ref 7.350–7.450)
PO2 ART: 64.2 mmHg — AB (ref 80.0–100.0)
Patient temperature: 98.6
RATE: 35 resp/min
TCO2: 18.2 mmol/L (ref 0–100)
TCO2: 19.6 mmol/L (ref 0–100)
TCO2: 22.8 mmol/L (ref 0–100)
VT: 480 mL
VT: 480 mL
pO2, Arterial: 63.7 mmHg — ABNORMAL LOW (ref 80.0–100.0)
pO2, Arterial: 62.6 mmHg — ABNORMAL LOW (ref 80.0–100.0)

## 2015-07-18 LAB — CARBOXYHEMOGLOBIN
Carboxyhemoglobin: 0.3 % — ABNORMAL LOW (ref 0.5–1.5)
Methemoglobin: 0.6 % (ref 0.0–1.5)
O2 Saturation: 51.9 %
Total hemoglobin: 13.8 g/dL (ref 13.5–18.0)

## 2015-07-18 LAB — BASIC METABOLIC PANEL
ANION GAP: 20 — AB (ref 5–15)
BUN: 24 mg/dL — ABNORMAL HIGH (ref 6–20)
CALCIUM: 6.7 mg/dL — AB (ref 8.9–10.3)
CO2: 22 mmol/L (ref 22–32)
CREATININE: 3.31 mg/dL — AB (ref 0.61–1.24)
Chloride: 99 mmol/L — ABNORMAL LOW (ref 101–111)
GFR, EST AFRICAN AMERICAN: 23 mL/min — AB (ref >60)
GFR, EST NON AFRICAN AMERICAN: 20 mL/min — AB (ref >60)
Glucose, Bld: 179 mg/dL — ABNORMAL HIGH (ref 65–99)
Potassium: 2.8 mmol/L — ABNORMAL LOW (ref 3.5–5.1)
SODIUM: 141 mmol/L (ref 135–145)

## 2015-07-18 LAB — URINE CULTURE: CULTURE: NO GROWTH

## 2015-07-18 LAB — GLUCOSE, CAPILLARY
GLUCOSE-CAPILLARY: 110 mg/dL — AB (ref 65–99)
GLUCOSE-CAPILLARY: 154 mg/dL — AB (ref 65–99)
GLUCOSE-CAPILLARY: 88 mg/dL (ref 65–99)
GLUCOSE-CAPILLARY: 253 mg/dL — AB (ref 65–99)

## 2015-07-18 LAB — CBC
HEMATOCRIT: 47.9 % (ref 39.0–52.0)
Hemoglobin: 15.3 g/dL (ref 13.0–17.0)
MCH: 32.1 pg (ref 26.0–34.0)
MCHC: 31.9 g/dL (ref 30.0–36.0)
MCV: 100.4 fL — ABNORMAL HIGH (ref 78.0–100.0)
PLATELETS: 82 10*3/uL — AB (ref 150–400)
RBC: 4.77 MIL/uL (ref 4.22–5.81)
RDW: 14.1 % (ref 11.5–15.5)
WBC: 4.5 10*3/uL (ref 4.0–10.5)

## 2015-07-18 LAB — CG4 I-STAT (LACTIC ACID): Lactic Acid, Venous: 7.04 mmol/L (ref 0.5–2.0)

## 2015-07-18 LAB — PHOSPHORUS: PHOSPHORUS: 4.4 mg/dL (ref 2.5–4.6)

## 2015-07-18 LAB — MAGNESIUM: MAGNESIUM: 2 mg/dL (ref 1.7–2.4)

## 2015-07-18 LAB — PROCALCITONIN: Procalcitonin: 28.72 ng/mL

## 2015-07-18 MED ORDER — SODIUM BICARBONATE 8.4 % IV SOLN
INTRAVENOUS | Status: AC
  Filled 2015-07-18: qty 100

## 2015-07-18 MED ORDER — VANCOMYCIN HCL IN DEXTROSE 1-5 GM/200ML-% IV SOLN: 1000.0000 mg | INTRAVENOUS | Status: DC

## 2015-07-18 MED ORDER — POTASSIUM CHLORIDE 10 MEQ/50ML IV SOLN
10.0000 meq | INTRAVENOUS | Status: AC
  Administered 2015-07-18 (×4): 10 meq via INTRAVENOUS
  Filled 2015-07-18 (×4): qty 50

## 2015-07-18 MED ORDER — SODIUM BICARBONATE 8.4 % IV SOLN
100.0000 meq | Freq: Once | INTRAVENOUS | Status: AC
  Administered 2015-07-18: 100 meq via INTRAVENOUS

## 2015-07-20 LAB — BLOOD GAS, ARTERIAL
ACID-BASE DEFICIT: 6.4 mmol/L — AB (ref 0.0–2.0)
BICARBONATE: 20.5 meq/L (ref 20.0–24.0)
DRAWN BY: 290171
FIO2: 100
O2 Saturation: 79.1 %
PATIENT TEMPERATURE: 98.6
PCO2 ART: 55.8 mmHg — AB (ref 35.0–45.0)
PEEP/CPAP: 12 cmH2O
PH ART: 7.191 — AB (ref 7.350–7.450)
RATE: 35 resp/min
TCO2: 22.2 mmol/L (ref 0–100)
VT: 530 mL
pO2, Arterial: 59.3 mmHg — ABNORMAL LOW (ref 80.0–100.0)

## 2015-07-20 LAB — GLUCOSE, CAPILLARY: Glucose-Capillary: 43 mg/dL — CL (ref 65–99)

## 2015-07-21 LAB — CULTURE, BLOOD (ROUTINE X 2)
Culture: NO GROWTH
Culture: NO GROWTH

## 2015-08-01 NOTE — Progress Notes (Signed)
Ref: [name],Robert H, NP   Subjective:  Sedated, intubated. Events noted over last 12 hours. On NO/NO2 treatment to lower pulmonary resistance, full dose pressure agents. Converted to sinus rhythm with frequent APCs but poor tissue perfusion with very low systemic blood pressure. No urine output.  Objective:  Vital Signs in the last 24 hours: Temp:  [97.3 F (36.3 C)-99.4 F (37.4 C)] 98.2 F (36.8 C) (06/17 0740) Pulse Rate:  [38-113] 113 (06/16 1200) Cardiac Rhythm:  [-] Sinus tachycardia (06/16 2100) Resp:  [19-35] 35 (06/17 0645) BP: (73-112)/(45-87) 73/54 mmHg (06/16 1900) SpO2:  [70 %-100 %] 92 % (06/17 0645) Arterial Line BP: (57-134)/(29-77) 61/31 mmHg (06/17 0645) FiO2 (%):  [40 %-100 %] 100 % (06/17 0321) Weight:  [97.2 kg (214 lb 4.6 oz)] 97.2 kg (214 lb 4.6 oz) (06/17 0500)  Physical Exam: BP Readings from Last 1 Encounters:  07/17/15 73/54    Wt Readings from Last 1 Encounters:  08-17-15 97.2 kg (214 lb 4.6 oz)    Weight change: 10 kg (22 lb 0.7 oz)  HEENT: [place]/AT, Eyes-Conjunctiva-Pink, Sclera-Non-icteric Neck: + JVD, No bruit, Trachea midline. Lungs:  Coarse crackles, Bilateral. Cardiac:  Irregular rhythm ( Sinus on monitor), Faint S1 and S2.  Abdomen:  Soft but distended now. Extremities:  Trace edema of whole body is present. No cyanosis. No clubbing. CNS: AxOx0. Skin: Cool and calmmy.   Intake/Output from previous day: 06/16 0701 - 06/17 0700 In: 8124.6 [I.V.:6834.6; NG/GT:640; IV Piggyback:650] Out: 1000 [Urine:250; Emesis/NG output:750]    Lab Results: BMET    Component Value Date/Time   NA 141 08-17-15 0430   NA 139 07/17/2015 2000   NA 141 07/17/2015 0325   K 2.8* 17-Aug-2015 0430   K 4.6 07/17/2015 2000   K 3.1* 07/17/2015 0325   CL 99* August 17, 2015 0430   CL 107 07/17/2015 2000   CL 105 07/17/2015 0325   CO2 22 08-17-15 0430   CO2 21* 07/17/2015 2000   CO2 24 07/17/2015 0325   GLUCOSE 179* 08-17-15 0430   GLUCOSE 93 07/17/2015 2000    GLUCOSE 123* 07/17/2015 0325   BUN 24* 08/17/15 0430   BUN 24* 07/17/2015 2000   BUN 20 07/17/2015 0325   CREATININE 3.31* 08/17/15 0430   CREATININE 2.47* 07/17/2015 2000   CREATININE 1.81* 07/17/2015 0325   CALCIUM 6.7* August 17, 2015 0430   CALCIUM 7.3* 07/17/2015 2000   CALCIUM 7.9* 07/17/2015 0325   GFRNONAA 20* 08-17-2015 0430   GFRNONAA 29* 07/17/2015 2000   GFRNONAA 42* 07/17/2015 0325   GFRAA 23* 17-Aug-2015 0430   GFRAA 33* 07/17/2015 2000   GFRAA 49* 07/17/2015 0325   CBC    Component Value Date/Time   WBC 4.5 08-17-15 0430   RBC 4.77 08-17-2015 0430   HGB 15.3 2015-08-17 0430   HCT 47.9 17-Aug-2015 0430   HCT 36.5* 10/20/2014 0210   PLT 82* 17-Aug-2015 0430   MCV 100.4* 08-17-15 0430   MCH 32.1 August 17, 2015 0430   MCHC 31.9 17-Aug-2015 0430   RDW 14.1 08/17/2015 0430   LYMPHSABS 0.5* 06/11/2015 0228   MONOABS 1.0 06/11/2015 0228   EOSABS 0.0 06/11/2015 0228   BASOSABS 0.0 06/11/2015 0228   HEPATIC Function Panel  Recent Labs  06/11/15 0228 07/06/2015 0043 07/17/15 1904  PROT 5.7* 7.4 4.6*   HEMOGLOBIN A1C No components found for: HGA1C,  MPG CARDIAC ENZYMES Lab Results  Component Value Date   CKTOTAL 129 09/13/2009   CKMB 2.4 09/13/2009   TROPONINI 0.13* 07/17/2015  TROPONINI 0.14* 07/03/2015   TROPONINI 0.06* 07/08/2015   BNP No results for input(s): PROBNP in the last 8760 hours. TSH  Recent Labs  07/30/14 2321 10/18/14 1904 05/14/15 1446  TSH 1.012 0.774 1.970   CHOLESTEROL  Recent Labs  10/19/14 0244 05/15/15 0207 06/10/15 0234  CHOL 202* 137 134    Scheduled Meds: . antiseptic oral rinse  7 mL Mouth Rinse 10 times per day  . artificial tears  1 application Both Eyes F8B  . azithromycin  500 mg Intravenous Q24H  . calcium chloride  1 g Intravenous Once  . cefTRIAXone (ROCEPHIN)  IV  1 g Intravenous Q24H  . chlorhexidine gluconate (SAGE KIT)  15 mL Mouth Rinse BID  . feeding supplement (PRO-STAT SUGAR FREE 64)  30 mL  Per Tube 5 X Daily  . fentaNYL (SUBLIMAZE) injection  100 mcg Intravenous Once  . midazolam  2 mg Intravenous Once  . multivitamin with minerals  1 tablet Oral Daily  . pantoprazole sodium  40 mg Per Tube Q24H  . potassium chloride  10 mEq Intravenous Q1 Hr x 6  . sodium chloride flush  10-40 mL Intracatheter Q12H  . sodium chloride flush  3 mL Intravenous Q12H  . thiamine  100 mg Oral Daily  . vancomycin  750 mg Intravenous Q12H   Continuous Infusions: . amiodarone 60 mg/hr (Aug 13, 2015 0755)  . cisatracurium (NIMBEX) infusion 3 mcg/kg/min (08/13/2015 0755)  . dextrose 40 mL/hr at 2015/08/13 0755  . epinephrine 20 mcg/min (Aug 13, 2015 0755)  . feeding supplement (VITAL 1.5 CAL) Stopped (07/17/15 2300)  . fentaNYL infusion INTRAVENOUS 100 mcg/hr (08/13/2015 0755)  . midazolam (VERSED) infusion 2 mg/hr (13-Aug-2015 0755)  . milrinone Stopped (07/17/15 1715)  . norepinephrine (LEVOPHED) Adult infusion 60 mcg/min (Aug 13, 2015 0755)  . phenylephrine (NEO-SYNEPHRINE) Adult infusion 200 mcg/min (13-Aug-2015 0755)  .  sodium bicarbonate  infusion 1000 mL 50 mL/hr at 2015-08-13 0755  . vasopressin (PITRESSIN) infusion - *FOR SHOCK* 0.03 Units/min (2015/08/13 0755)   PRN Meds:.sodium chloride, albuterol, fentaNYL, fentaNYL (SUBLIMAZE) injection, midazolam, midazolam, ondansetron (ZOFRAN) IV, sodium chloride flush, sodium chloride flush  Assessment/Plan: S/P Cardiac arrest with PEA Acute respiratory failure with pulmonary edema Acute on chronic left heart systolic failure. Acute renal failure Atrial fibrillation with RVR, chronic with CHA2DS2VASc score of 4 Sinus rhythm with frequent APCs and electrical mechanical dissociation Chronic right heart systolic failure Possible pneumonia ETOH abuse with withdrawal syndrome Tobacco use disorder Anxiety/Panic attack Moderate to severe MR and TR Abdominal pain Acute GI bleed   Poor prognosis with multiorgan system failure. Heart failure team and CCM following and  covering.  I will available by phone only till Sunday evening. Left massage for girl friend Maxwell Marion to call us at [phone] ASAP.   LOS: 2 days    Dixie Dials  MD  08/13/15, 8:09 AM

## 2015-08-01 NOTE — Discharge Summary (Signed)
Physician Death Summary  Patient ID: [name] MRN: [id number] DOB/AGE: [birth year] 50 y.o.  Admit date: 10-Aug-2015 Discharge date: 07/21/2015  Admission Diagnoses: Acute on chronic left heart systolic failure. Atrial fibrillation with RVR, chronic with CHA2DS2VASc score of 4 Chronic right heart systolic failure Possible pneumonia ETOH abuse Tobacco use disorder Anxiety/Panic attack Moderate to severe MR and TR Abdominal pain CKD, II   Discharge Diagnoses:  Principle Problems: * Acute on chronic systolic heart failure with cardiogenic shock-EF 15 % * Other Problems:    Acute renal failure   Acute respiratory failure   Acute hepatic dysfunction   S/P cardiac arrest with PEA   ETOH abuse, heavy   CAP- on azithromycin and ceftriaxone   Acute metabolic encephalopathy   Atrial fibrillation with RVR   Sinus rhythm with mechanical and electrical dissociation   Acute gastrointestinal hemorrhage   Dilated and nonischemic cardiomyopathy (HCC)  Discharged Condition: Expired-11:10 AM on 07/17/2015  Hospital Course: 50 year old male with past medical history significant for nonischemic dilated cardiomyopathy, hypertension, history of A. fib with RVR in the past CHA2DS2VASc score of 4, anxiety disorder, history of TIA, EtOH abuse, tobacco abuse, noncompliant to medication and follow-up, came to the ER complaining of progressive increasing shortness of breath for last for 7 days associated with pleuritic chest pain and palpitations. Patient was noted to be in A. fib with RVR. Chest x-ray showed cardiomegaly and mild interstitial edema. CT angio chest showed changes of right heart failure, possible pneumonia and no evidence of significant pulmonary embolus. He received IV antibiotics for possible pneumonia and IV lorazepam for anxiety with possible alcohol withdrawal. In about 12 hours post admission patient suffered cardiac arrest. In less than 4 minutes he had return of spontaneous  circulation. He was intubated and started on pressure agents. He was also started on IV milrinone per Heart failure team's recommendation. After 36 hours of intensive care he could not maintain reasonable blood pressure and circulation with maximum doses of pressure agents. He had acute renal failure and also had acute GI bleed previously. Patient was made DNR and expired and pronounced dead by two nurses at 11.10 AM on 07/22/2015. [facility] to area [facility] was made.  Consults: cardiology and CCM.  Significant Diagnostic Studies: labs: Near normal electrolytes and borderline high sugar and creatinine. Normal CBC except low platelets count. Post cardiac arrest patient had respiratory and metabolic acidosis. Markedly elevated AST and ALT with mild elevation of total bilirubin.  CXR: Mild atelectasis or interstitial edema and mild cardiomegaly.  CT scan: No evidence of significant pulmonary embolus. Changes of right heart failure with passive liver congestion. Possible pneumonia and cholecystitis.  Treatments: antibiotics: ceftriaxone and azithromycin and cardiac meds: amiodarone, epinephrine, levophed, and milrinone IV drips. Intubation with respirator support.  Discharge Exam: Asystolic and no respirations with cold and clammy skin on 07/21/2015.  Disposition: 20-Expired   Signed: Krisna Omar S 07/21/2015, 8:45 AM

## 2015-08-01 NOTE — Progress Notes (Signed)
Pharmacy Antibiotic Note  [name] is a 50 y.o. male admitted on 07/23/2015 with cardiogenic shock on abx for pneumonia.  Pharmacy has been consulted for vancomycin dosing.  Afeb, wbc wnl. PCT 28.72, lactate 7. Cr continuing to climb up to 3.31, anuric this AM. Started vancomycin on 6/15 and received 750 mg x 2. No loading doses given, last dose vancomycin at 0855.   Plan: AM random vancomycin level Start vancomycin dosing per level F/u renal function, cx results  Height: 5\' 7"  (170.2 cm) Weight: 214 lb 4.6 oz (97.2 kg) IBW/kg (Calculated) : 66.1  Temp (24hrs), Avg:98.4 F (36.9 C), Min:97.3 F (36.3 C), Max:99.4 F (37.4 C)   Recent Labs Lab 07/17/2015 0043  07/23/2015 1047 07/02/2015 1330 07/17/2015 2125 07/17/15 0325 07/17/15 2000 07/17/15 2351 2015/08/01 0430  WBC 6.7  --   --   --   --  5.1 3.2*  --  4.5  CREATININE 1.48*  < > 1.72*  --  2.14* 1.81* 2.47*  --  3.31*  LATICACIDVEN  --   --   --  12.4* 6.8*  --  5.0* 7.04*  --   < > = values in this interval not displayed.  Estimated Creatinine Clearance: 29.6 mL/min (by C-G formula based on Cr of 3.31).    Allergies  Allergen Reactions  . Bee Venom Anaphylaxis    Uses epi pen-- last used 08/23/11    Thank you for allowing pharmacy to be a part of this patient's care.   Hillery Aldo, [place].D., BCPS PGY2 Cardiology Pharmacy Resident Pager: [phone] 2015-08-01 10:07 AM

## 2015-08-01 NOTE — Progress Notes (Signed)
150 cc fentanyl drip and 30 cc versed drip wasted in sink followed by water flush by 2 RN.

## 2015-08-01 NOTE — Progress Notes (Signed)
RT Note: RT called to room by. RN stated that MD turned off the vent, code status DNR. Pt extubated.

## 2015-08-01 NOTE — Progress Notes (Signed)
Panic value results called to me from Respiratory Charge, relayed to Dr. Elisabeth Most PCCM in house tonight. No changes at this time. Will continue to monitor.

## 2015-08-01 NOTE — Progress Notes (Signed)
eLink Physician-Brief Progress Note Patient Name: [name] DOB: 1965/07/22 MRN: [id number]   Date of Service  07/15/2015  HPI/Events of Note  Hypokalemia  eICU Interventions  Potassium replaced     Intervention Category Intermediate Interventions: Electrolyte abnormality - evaluation and management  DETERDING,ELIZABETH 07/17/2015, 5:48 AM

## 2015-08-01 NOTE — Consult Note (Signed)
Advanced Heart Failure Team Rounding Note  Referring Physician: [id number] Primary Physician:  Primary Cardiologist:  Dr Doylene Canard   HPI:    Continues to deteriorate now with SBP in the 50s despite max doses of epi, norepi, neosynephrine and vasopressin. Also on bicarb gtt.   Converted to NSR last night at 7pm with no effect on BP.   Has multi-system organ failure with anuric renal failure and ARDS on 100% FiO2. Blood now coming out of NG tube.    Objective:    Vital Signs:   Temp:  [97.3 F (36.3 C)-99.4 F (37.4 C)] 98.2 F (36.8 C) (06/17 0740) Pulse Rate:  [88-113] 88 (06/17 0955) Resp:  [19-35] 35 (06/17 0955) BP: (59-104)/(38-73) 59/38 mmHg (06/17 0955) SpO2:  [70 %-100 %] 100 % (06/17 0955) Arterial Line BP: (54-134)/(29-77) 63/32 mmHg (06/17 0945) FiO2 (%):  [40 %-100 %] 100 % (06/17 0955) Weight:  [97.2 kg (214 lb 4.6 oz)] 97.2 kg (214 lb 4.6 oz) (06/17 0500) Last BM Date: August 02, 2015  Weight change: Filed Weights   07/05/2015 0700 07/17/15 0234 2015-08-02 0500  Weight: 80.3 kg (177 lb 0.5 oz) 87.2 kg (192 lb 3.9 oz) 97.2 kg (214 lb 4.6 oz)    Intake/Output:   Intake/Output Summary (Last 24 hours) at 08/02/2015 1026 Last data filed at 08/02/15 0938  Gross per 24 hour  Intake 8792.83 ml  Output    940 ml  Net 7852.83 ml     Physical Exam: General:  Intubated sedated cool HEENT: normal x for ETT  Blood coming from NGT Neck: supple. RIJ TLC Carotids 2+ bilat; no bruits. No lymphadenopathy or thyromegaly appreciated. Cor: PMI laterally displaced.RRR + s3 +MR Lungs: +rhonchi Abdomen: + verydistended. No bowel sounds. Extremities: no cyanosis, clubbing, rash. Very cool. Mild edema  Neuro: intubated/sedated  Telemetry: NSR 80s  Labs: Basic Metabolic Panel:  Recent Labs Lab 07/23/2015 1047 07/19/2015 2125 07/15/2015 2356 07/17/15 0325 07/17/15 1645 07/17/15 2000 08/02/15 0430  NA 139 139  --  141  --  139 141  K 6.4* 3.9  --  3.1*  --  4.6 2.8*  CL 110 104   --  105  --  107 99*  CO2 15* 20*  --  24  --  21* 22  GLUCOSE 103* 188*  --  123*  --  93 179*  BUN 18 20  --  20  --  24* 24*  CREATININE 1.72* 2.14*  --  1.81*  --  2.47* 3.31*  CALCIUM 9.0 8.0*  --  7.9*  --  7.3* 6.7*  MG  --   --  1.9 1.6* 2.2  --  2.0  PHOS  --   --  2.9 2.9 4.0  --  4.4    Liver Function Tests:  Recent Labs Lab 07/15/2015 0043 07/17/15 1904  AST 56* 3384*  ALT 34 2107*  ALKPHOS 74 103  BILITOT 1.6* 1.6*  PROT 7.4 4.6*  ALBUMIN 4.1 2.5*    Recent Labs Lab 07/28/2015 0043  LIPASE 22   No results for input(s): AMMONIA in the last 168 hours.  CBC:  Recent Labs Lab 07/10/2015 0043 07/23/2015 0116 07/17/15 0325 07/17/15 2000 08/02/2015 0430  WBC 6.7  --  5.1 3.2* 4.5  HGB 16.5 18.0* 14.8 17.1* 15.3  HCT 49.1 53.0* 42.9 51.6 47.9  MCV 96.5  --  92.7 97.2 100.4*  PLT 93*  --  64* 92* 82*    Cardiac Enzymes:  Recent Labs Lab  07/02/2015 0425 07/08/2015 1047 07/26/2015 1500 07/11/2015 2125 07/17/15 0325  TROPONINI 0.04* 0.06* 0.06* 0.14* 0.13*    BNP: BNP (last 3 results)  Recent Labs  05/14/15 1446 06/09/15 2005 07/28/2015 0425  BNP 1076.6* 1590.7* 594.3*    ProBNP (last 3 results) No results for input(s): PROBNP in the last 8760 hours.   CBG:  Recent Labs Lab 07/17/15 1642 07/17/15 2040 07/17/15 2322 Jul 27, 2015 0428 2015-07-27 0751  GLUCAP 73 110* 154* 88 253*    Coagulation Studies:  Recent Labs  07/31/2015 1047  LABPROT 28.9*  INR 2.78*    Other results:  Imaging: Dg Chest Port 1 View  Jul 27, 2015  CLINICAL DATA:  Respiratory failure EXAM: PORTABLE CHEST 1 VIEW COMPARISON:  Chest radiograph from one day prior. FINDINGS: Endotracheal tube tip is 3.5 cm above the carina. Right internal jugular central venous catheter terminates in the upper third of the superior vena cava. Enteric tube terminates in the very proximal stomach. Stable cardiomediastinal silhouette with mild cardiomegaly. No pneumothorax. Stable small left pleural  effusion. Low lung volumes. No overt pulmonary edema. Patchy left lung base consolidation is stable. IMPRESSION: 1. Support structures as described. 2. Stable low lung volumes with patchy left lung base consolidation, either atelectasis and/or pneumonia. 3. Stable mild cardiomegaly without overt pulmonary edema. 4. Stable small left pleural effusion. Electronically Signed   By: Ilona Sorrel M.D.   On: 07/27/2015 07:37   Dg Chest Port 1 View  07/17/2015  CLINICAL DATA:  Respiratory failure, status post respiratory arrest. EXAM: PORTABLE CHEST 1 VIEW COMPARISON:  07/17/2015 FINDINGS: Endotracheal tube terminates 2.9 cm above the carina. Right internal jugular approach central venous catheter terminates over the expected location of proximal superior vena cava. Enteric catheter is partially visualized. Shock pads overlie the thorax. The cardiac silhouette is enlarged. Mediastinal contours appear intact. There is no evidence of pneumothorax. There is mild improvement in the aeration of the lungs with persistent predominantly interstitial airspace opacities and probably bilateral pleural effusions. Osseous structures are without acute abnormality. Soft tissues are grossly normal. IMPRESSION: Mild improvement in the aeration of the lungs with persistent predominantly interstitial pulmonary opacities, and probable bilateral pleural effusions. Findings suggestive of interstitial pulmonary edema. Enlarged cardiac silhouette. Electronically Signed   By: Fidela Salisbury M.D.   On: 07/17/2015 19:43   Dg Chest Port 1 View  07/17/2015  CLINICAL DATA:  Intubation. EXAM: PORTABLE CHEST 1 VIEW COMPARISON:  07/02/2015. FINDINGS: Endotracheal tube, NG tube, right IJ line stable position. Cardiomegaly with diffuse bilateral pulmonary infiltrates and bilateral pleural effusions consistent congestive heart failure. No pneumothorax. IMPRESSION: 1. Lines and tubes in stable position. 2. Cardiomegaly with development of bilateral  diffuse pulmonary infiltrates and bilateral pleural effusions consistent with congestive heart failure. Electronically Signed   By: Marcello Moores  Register   On: 07/17/2015 07:00   Dg Chest Port 1 View  07/17/2015  CLINICAL DATA:  Status post central line placement EXAM: PORTABLE CHEST 1 VIEW COMPARISON:  07/27/2015 FINDINGS: Cardiac shadow is again enlarged. An endotracheal tube is now seen in satisfactory position 4.6 cm above the carina. A right jugular central line is noted in the proximal superior vena cava. Nasogastric catheter is noted within the stomach. The lungs are well aerated bilaterally. Mild left basilar atelectasis is noted. No bony abnormality is seen. No pneumothorax is noted. IMPRESSION: Tubes and lines as described. No pneumothorax.  Persistent left atelectasis is noted. Electronically Signed   By: Inez Catalina M.D.   On: 07/08/2015 14:45  Medications:     Current Medications: . antiseptic oral rinse  7 mL Mouth Rinse 10 times per day  . artificial tears  1 application Both Eyes U3J  . azithromycin  500 mg Intravenous Q24H  . calcium chloride  1 g Intravenous Once  . cefTRIAXone (ROCEPHIN)  IV  1 g Intravenous Q24H  . chlorhexidine gluconate (SAGE KIT)  15 mL Mouth Rinse BID  . feeding supplement (PRO-STAT SUGAR FREE 64)  30 mL Per Tube 5 X Daily  . fentaNYL (SUBLIMAZE) injection  100 mcg Intravenous Once  . midazolam  2 mg Intravenous Once  . multivitamin with minerals  1 tablet Oral Daily  . pantoprazole sodium  40 mg Per Tube Q24H  . potassium chloride  10 mEq Intravenous Q1 Hr x 6  . sodium chloride flush  10-40 mL Intracatheter Q12H  . sodium chloride flush  3 mL Intravenous Q12H  . thiamine  100 mg Oral Daily    Infusions: . amiodarone 60 mg/hr (2015-07-24 0755)  . cisatracurium (NIMBEX) infusion 3 mcg/kg/min (07-24-15 0755)  . dextrose 40 mL/hr at July 24, 2015 0755  . epinephrine 20 mcg/min (07/24/2015 0755)  . feeding supplement (VITAL 1.5 CAL) Stopped (07/17/15 2300)   . fentaNYL infusion INTRAVENOUS 100 mcg/hr (2015/07/24 0755)  . midazolam (VERSED) infusion 2 mg/hr (July 24, 2015 0755)  . milrinone Stopped (07/17/15 1715)  . norepinephrine (LEVOPHED) Adult infusion 60 mcg/min (July 24, 2015 0914)  . phenylephrine (NEO-SYNEPHRINE) Adult infusion 200 mcg/min (07-24-2015 0755)  .  sodium bicarbonate  infusion 1000 mL 50 mL/hr at July 24, 2015 0755  . vasopressin (PITRESSIN) infusion - *FOR SHOCK* 0.03 Units/min (07/24/15 0755)     Assessment   1. Acute on chronic systolic HF with cardiogenic shock - EF 15% 2. Cardiac arrest 3. Acute renal failure 4. Hypokalemia  (was hyperkalemic on admit) 5. ETOH abuse, heavy 6. CAP - on zithromax and rocephin 7. Acute metabolic encephalopathy 8. AF with RVR 9. Acute respiratory failure 10. Gastrointestinal hemorrhage    Plan    He is critically ill with multi-system organ failure and recent cardiac arrest in the setting of severe cardiomyopathy. He remains profoundly hypotensive despite maximal pressor support.  The chance of meaningful recovery at this point is zero.   Multiple care providers (inclduing myself) have attempted to reach out to his emergency contacts with little or no luck.   At this point we have reached medical futility. He will pass within minutes to hours. There is no role for further aggressive resuscitation. Continued aggressive therapy will only serve to prolong his suffering.   \Will make DNR/DNI and de-escalate pressors. Will keep comfortable. There are numerous messages out to his girlfriend to contact us as soon as she can. According to the nursing staff, his uncle has said he is not familiar with family members and would not know his wishes.  The patient is critically ill with multiple organ systems failure and requires high complexity decision making for assessment and support, frequent evaluation and titration of therapies, application of advanced monitoring technologies and extensive interpretation  of multiple databases.   Critical Care Time devoted to patient care services described in this note is 45 Minutes.      Length of Stay: 2  Glori Bickers MD 07-24-15, 10:26 AM  Advanced Heart Failure Team Pager [phone] (M-F; [place])  Please contact [facility] for night-coverage after hours (4p -7a ) and weekends on [email or web address]

## 2015-08-01 NOTE — Progress Notes (Signed)
Pt pronounced by [facility] and Deneise Lever RNs.  No respiratory effort noted, no heart tones or respirations auscultated.  No family or visitors at beside.  Physicians have left multiple messages for emergency contacts to call for updates.

## 2015-08-01 DEATH — deceased

## 2016-03-13 IMAGING — CR DG CHEST 1V PORT
2 series · 2 of 2 positions shown · non-contrast
Comparison: 06/21/2013 and prior chest radiographs

CLINICAL DATA: Chest pain, cough and shortness of breath

EXAM:
PORTABLE CHEST - 1 VIEW

[AP (1 of 2)]
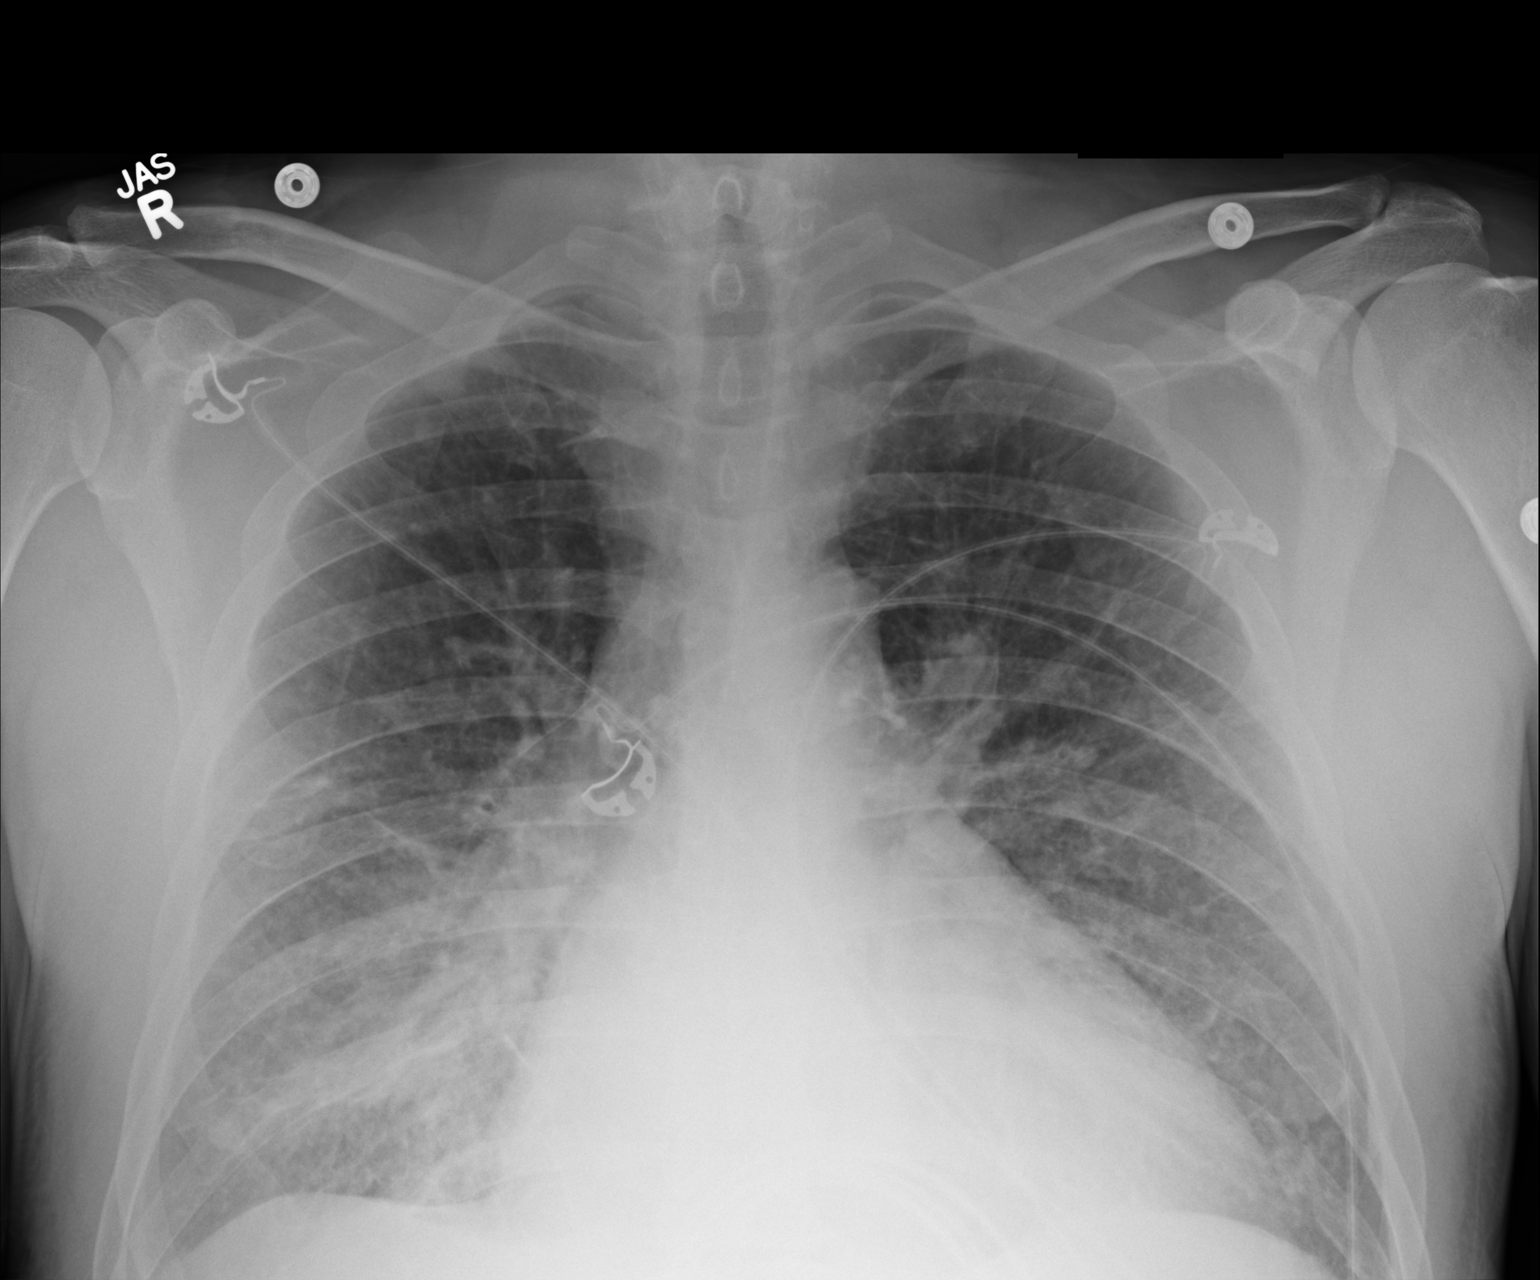

[AP (2 of 2)]
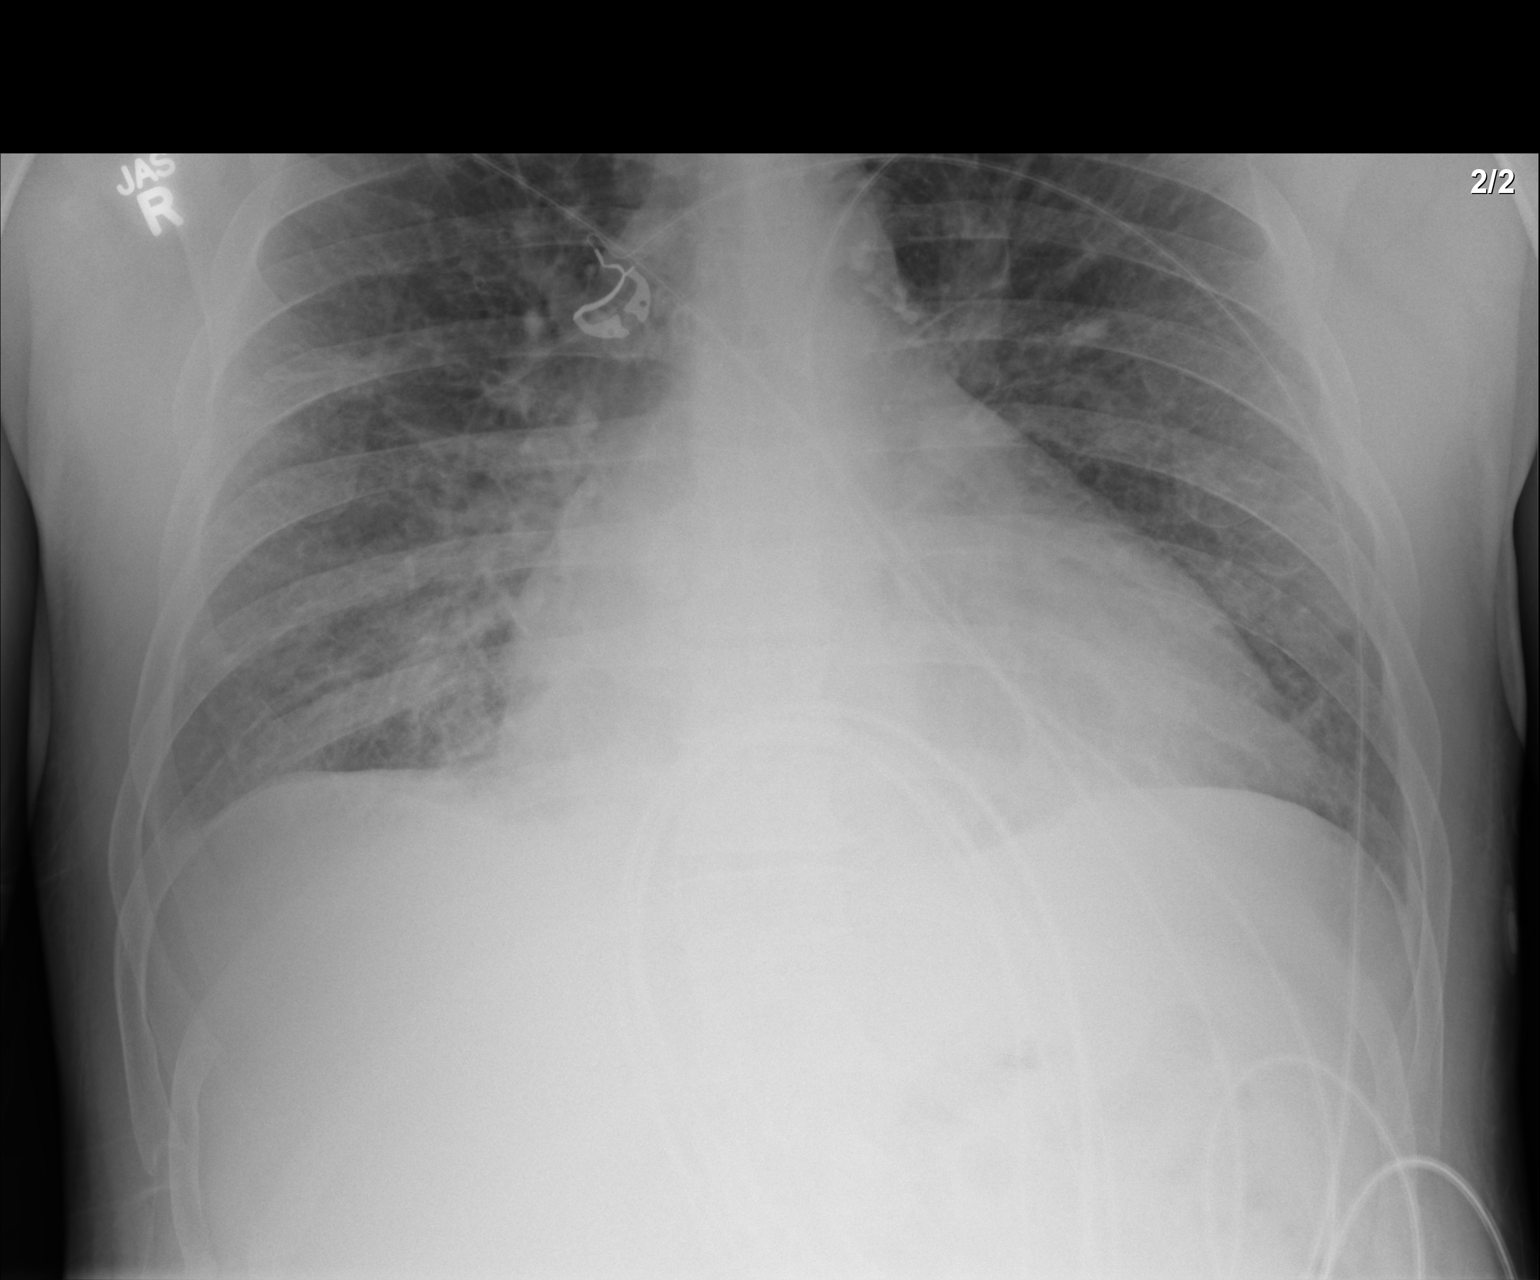

[2 of 2 positions shown; findings below may reference images not displayed]

FINDINGS: Cardiomegaly is again noted.

Developing right lower lung opacity is suspicious for pneumonia.

Peribronchial/interstitial opacities are again noted.

There is no evidence of pleural effusion, pneumothorax or acute bony
abnormality.
IMPRESSION: Developing right lower lung opacity suspicious for pneumonia.

## 2018-02-22 IMAGING — DX DG CHEST 1V PORT
1 series · 1 of 1 positions shown · non-contrast
Comparison: 05/14/2015

CLINICAL DATA: Atrial fibrillation

EXAM:
PORTABLE CHEST 1 VIEW

[chest ap]
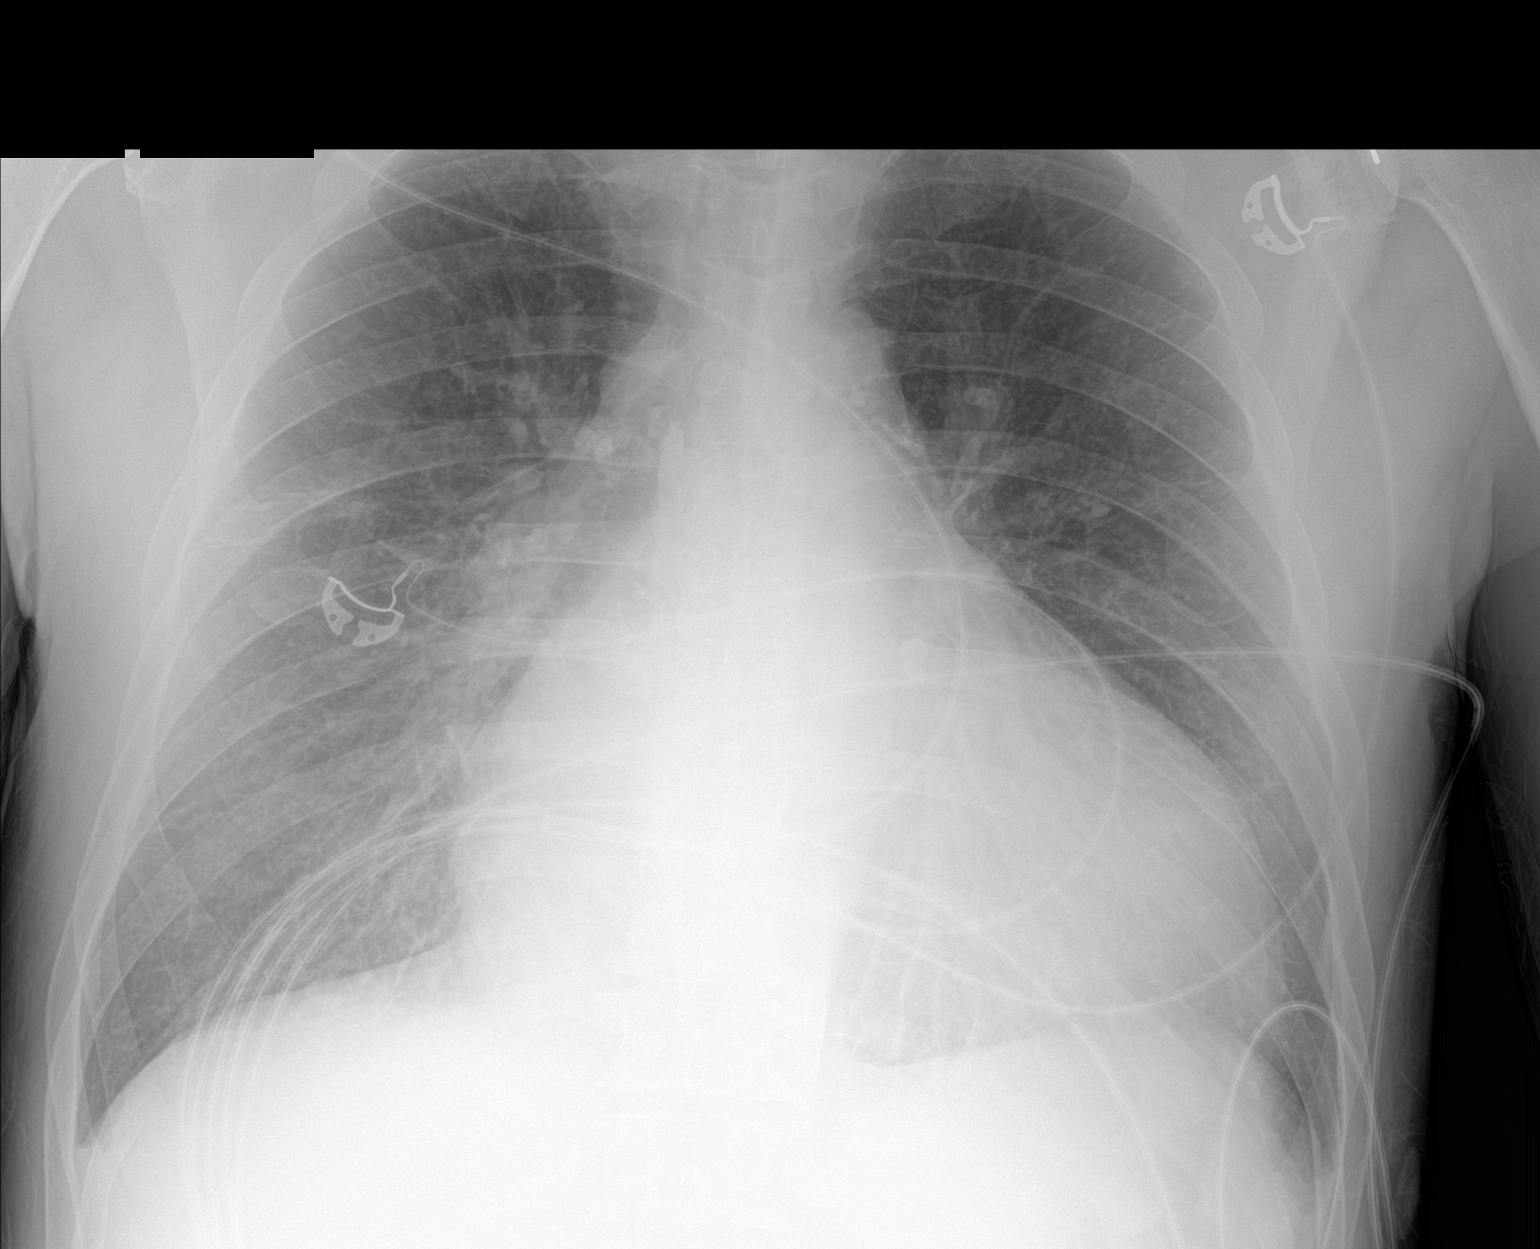

[1 of 1 positions shown; findings below may reference images not displayed]

FINDINGS: Mild bilateral interstitial thickening. There is no focal
parenchymal opacity. There is no pleural effusion or pneumothorax.
There is stable cardiomegaly.

The osseous structures are unremarkable.
IMPRESSION: Cardiomegaly with mild pulmonary vascular congestion.
